# Patient Record
Sex: Female | Born: 1935
Health system: Southern US, Community
[De-identification: ages and names within clinical notes are randomized; demographics above are authoritative.]

## PROBLEM LIST (undated history)

## (undated) DIAGNOSIS — I429 Cardiomyopathy, unspecified: Secondary | ICD-10-CM

## (undated) DIAGNOSIS — E079 Disorder of thyroid, unspecified: Secondary | ICD-10-CM

## (undated) DIAGNOSIS — D649 Anemia, unspecified: Secondary | ICD-10-CM

## (undated) DIAGNOSIS — N189 Chronic kidney disease, unspecified: Secondary | ICD-10-CM

## (undated) DIAGNOSIS — C801 Malignant (primary) neoplasm, unspecified: Secondary | ICD-10-CM

---

## 2005-10-04 ENCOUNTER — Ambulatory Visit: Payer: Self-pay | Admitting: Pulmonary Disease

## 2005-10-09 ENCOUNTER — Ambulatory Visit (HOSPITAL_COMMUNITY): Admission: RE | Admit: 2005-10-09 | Discharge: 2005-10-09 | Payer: Self-pay | Admitting: Pulmonary Disease

## 2005-10-09 IMAGING — US US AORTA
1 series · 13 of 25 positions shown · non-contrast
Comparison: none

CLINICAL DATA: Prominence of abdominal aorta on physical examination.  History of cystectomy for bladder carcinoma with bowel conduit.
 AORTA/RETROPERITONEAL ULTRASOUND ? [DATE]:
TECHNIQUE: Complete retroperitoneal ultrasound examination was performed including evaluation of the abdominal aorta, IVC, common iliac vessel origins, and kidneys.

[Series 1: unknown · 0.28mm/px · 13 of 45 slices shown]
[im 1/45]
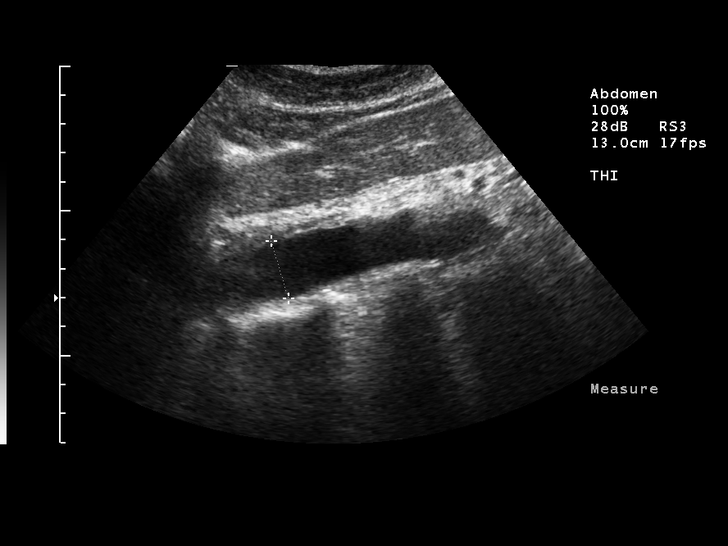
[im 4/45]
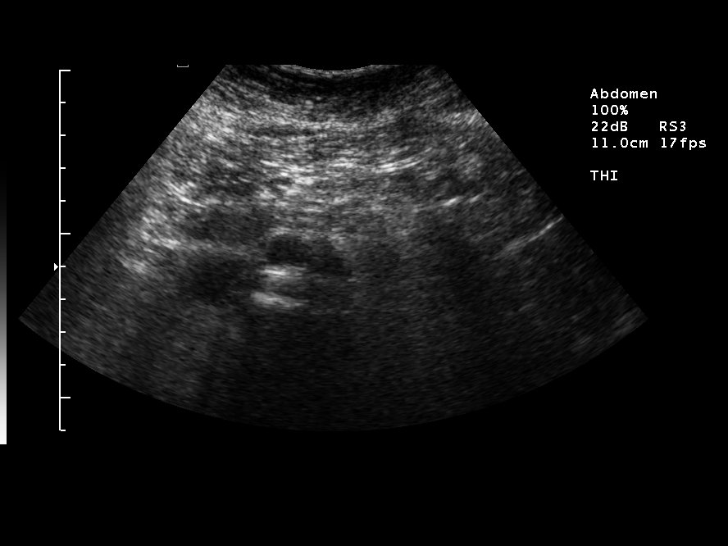
[im 8/45]
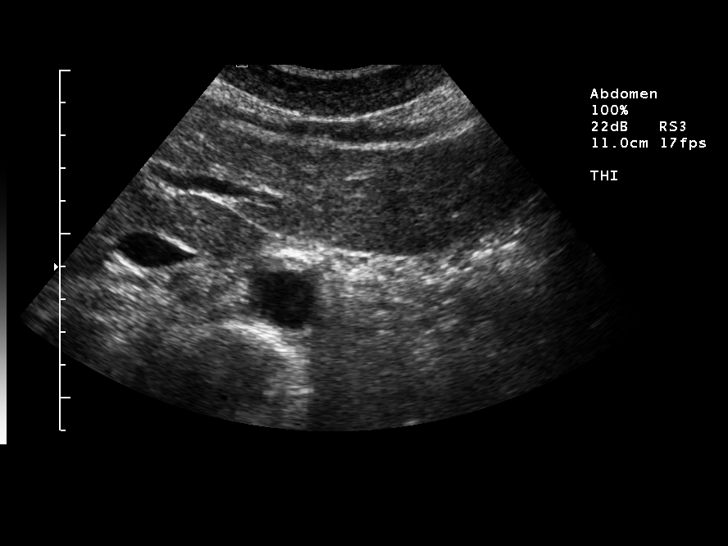
[im 12/45]
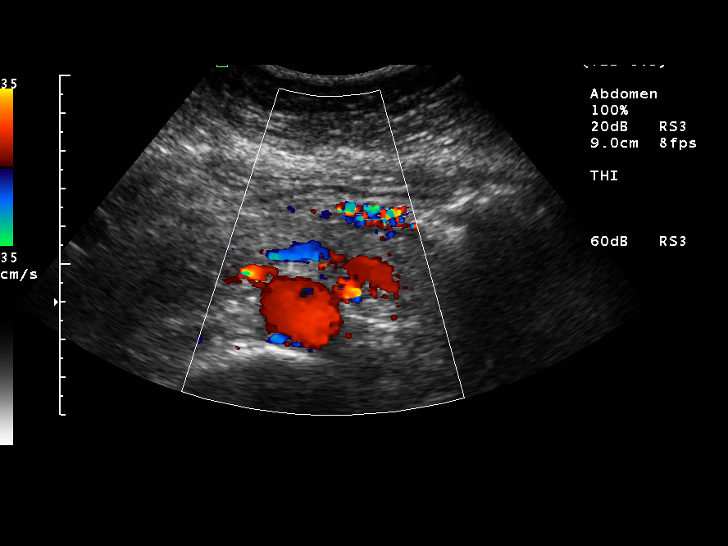
[im 15/45]
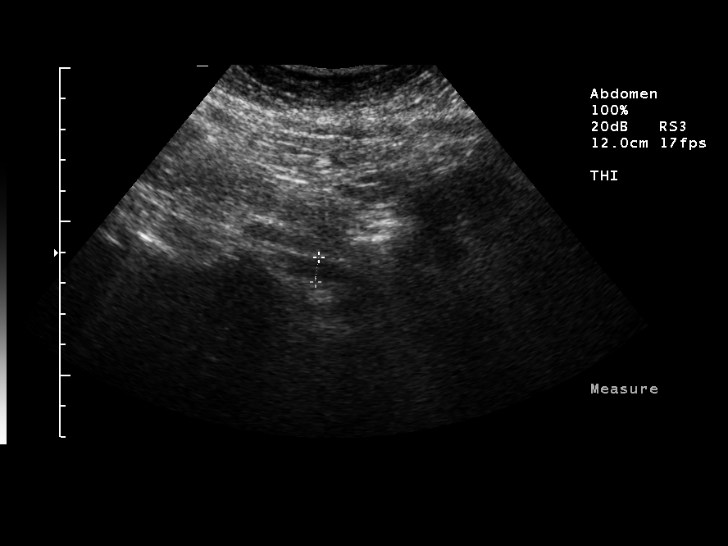
[im 19/45]
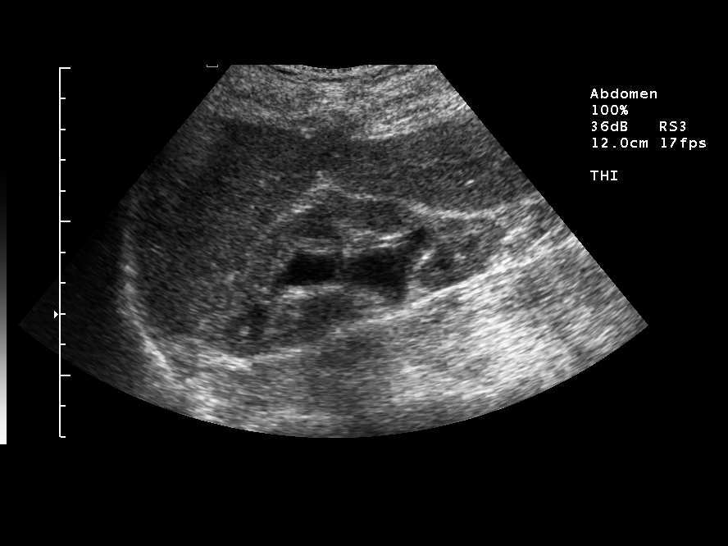
[im 23/45]
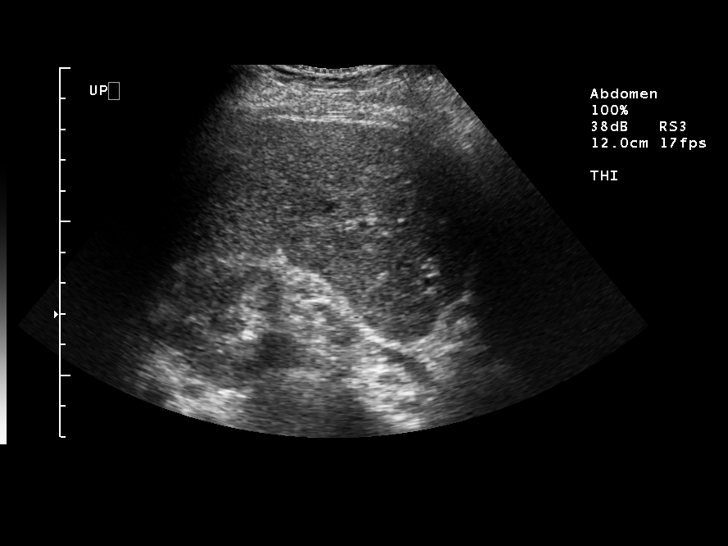
[im 26/45]
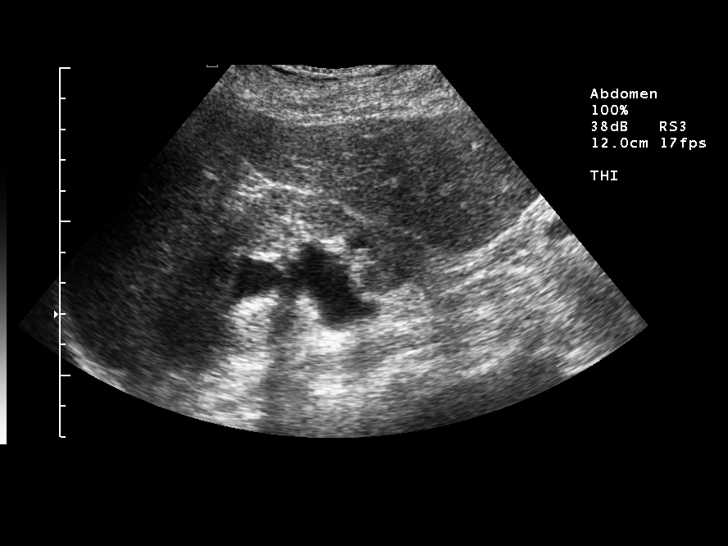
[im 30/45]
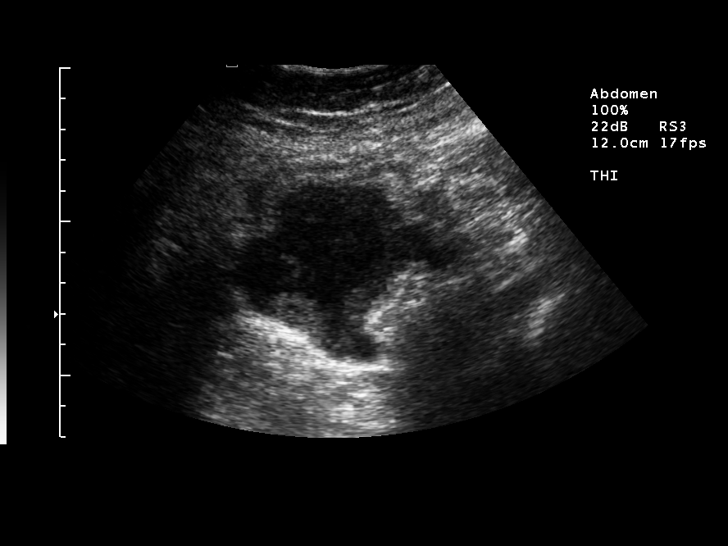
[im 34/45]
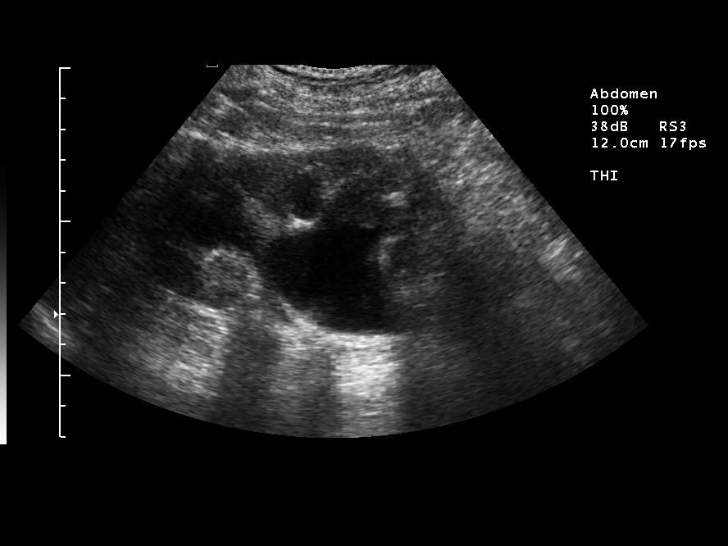
[im 37/45]
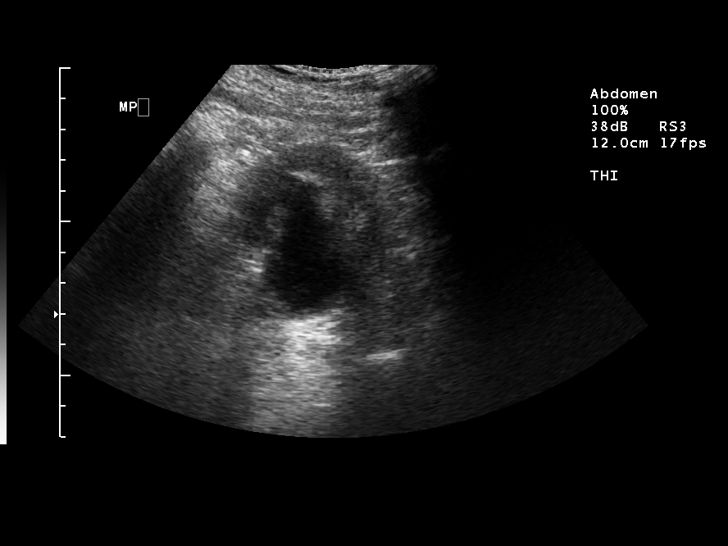
[im 41/45]
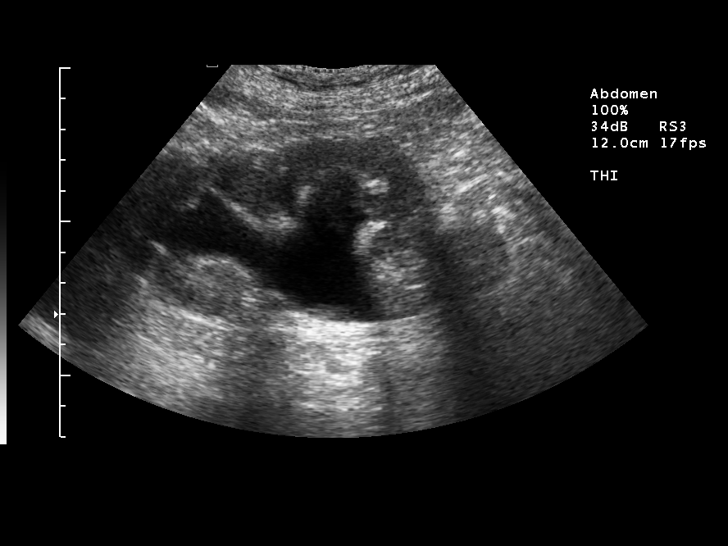
[im 45/45]
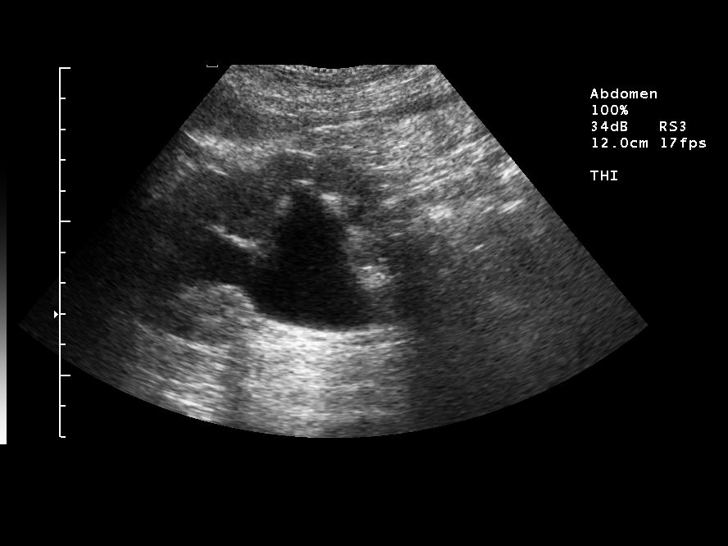

[13 of 25 positions shown; findings below may reference images not displayed]

FINDINGS: The abdominal aorta was well visualized on the study and demonstrates a maximal AP diameter of 2.1 cm.  A very minimal amount of calcified atherosclerotic plaque is present in the aorta.  Both common iliac artery origins are well visualized and normal in caliber measuring 10 mm bilaterally.  The inferior vena cava is normally patent.
 The right kidney measures 10.7 cm and the left kidney 11.4 cm.  Both demonstrate evidence of hydronephrosis, left greater than right.  Some degree of hydronephrosis would be expected with a bowel conduit given free reflux into the ureters.  Correlation is suggested with renal function as obstruction from mechanical cause such as ureteral anastomotic strictures cannot be excluded.  The conduit was also studied and demonstrates mild distention from urine.
IMPRESSION: 1.  Normal caliber of the abdominal aorta without aneurysm.
 2.  Bilateral hydronephrosis, left greater than right, in the setting of surgical urinary diversion.  This may simply be on the basis of free reflux via the implanted ureters.  Correlation is suggested with renal function as mechanical obstruction from ureteral stricture cannot be excluded by ultrasound.

## 2007-09-16 ENCOUNTER — Telehealth (INDEPENDENT_AMBULATORY_CARE_PROVIDER_SITE_OTHER): Payer: Self-pay | Admitting: *Deleted

## 2007-09-26 ENCOUNTER — Encounter: Payer: Self-pay | Admitting: Pulmonary Disease

## 2007-10-17 ENCOUNTER — Ambulatory Visit: Payer: Self-pay | Admitting: Pulmonary Disease

## 2007-10-17 DIAGNOSIS — C679 Malignant neoplasm of bladder, unspecified: Secondary | ICD-10-CM | POA: Insufficient documentation

## 2007-10-17 DIAGNOSIS — E039 Hypothyroidism, unspecified: Secondary | ICD-10-CM | POA: Insufficient documentation

## 2007-10-17 DIAGNOSIS — D126 Benign neoplasm of colon, unspecified: Secondary | ICD-10-CM | POA: Insufficient documentation

## 2007-10-17 DIAGNOSIS — E785 Hyperlipidemia, unspecified: Secondary | ICD-10-CM | POA: Insufficient documentation

## 2007-10-17 DIAGNOSIS — N259 Disorder resulting from impaired renal tubular function, unspecified: Secondary | ICD-10-CM | POA: Insufficient documentation

## 2007-10-21 ENCOUNTER — Ambulatory Visit: Payer: Self-pay | Admitting: Pulmonary Disease

## 2007-11-05 LAB — CONVERTED CEMR LAB
ALT: 18 units/L (ref 0–35)
AST: 20 units/L (ref 0–37)
Albumin: 3.4 g/dL — ABNORMAL LOW (ref 3.5–5.2)
Alkaline Phosphatase: 66 units/L (ref 39–117)
BUN: 25 mg/dL — ABNORMAL HIGH (ref 6–23)
Basophils Absolute: 0.1 10*3/uL (ref 0.0–0.1)
Basophils Relative: 1.1 % — ABNORMAL HIGH (ref 0.0–1.0)
Bilirubin, Direct: 0.1 mg/dL (ref 0.0–0.3)
CO2: 23 meq/L (ref 19–32)
Calcium: 9.1 mg/dL (ref 8.4–10.5)
Chloride: 112 meq/L (ref 96–112)
Cholesterol: 160 mg/dL (ref 0–200)
Creatinine, Ser: 1.6 mg/dL — ABNORMAL HIGH (ref 0.4–1.2)
Eosinophils Absolute: 0.2 10*3/uL (ref 0.0–0.7)
Eosinophils Relative: 4.2 % (ref 0.0–5.0)
GFR calc Af Amer: 40 mL/min
GFR calc non Af Amer: 33 mL/min
Glucose, Bld: 97 mg/dL (ref 70–99)
HCT: 30.9 % — ABNORMAL LOW (ref 36.0–46.0)
HDL: 40.4 mg/dL (ref >39.0)
Hemoglobin: 10.1 g/dL — ABNORMAL LOW (ref 12.0–15.0)
LDL Cholesterol: 110 mg/dL — ABNORMAL HIGH (ref 0–99)
Lymphocytes Relative: 28.8 % (ref 12.0–46.0)
MCHC: 32.7 g/dL (ref 30.0–36.0)
MCV: 91.5 fL (ref 78.0–100.0)
Monocytes Absolute: 0.5 10*3/uL (ref 0.1–1.0)
Monocytes Relative: 7.7 % (ref 3.0–12.0)
Neutro Abs: 3.4 10*3/uL (ref 1.4–7.7)
Neutrophils Relative %: 58.2 % (ref 43.0–77.0)
Platelets: 221 10*3/uL (ref 150–400)
Potassium: 4.5 meq/L (ref 3.5–5.1)
RBC: 3.38 M/uL — ABNORMAL LOW (ref 3.87–5.11)
RDW: 13.5 % (ref 11.5–14.6)
Sodium: 142 meq/L (ref 135–145)
TSH: 3.01 microintl units/mL (ref 0.35–5.50)
Total Bilirubin: 0.8 mg/dL (ref 0.3–1.2)
Total CHOL/HDL Ratio: 4
Total Protein: 7.4 g/dL (ref 6.0–8.3)
Triglycerides: 46 mg/dL (ref 0–149)
VLDL: 9 mg/dL (ref 0–40)
WBC: 5.9 10*3/uL (ref 4.5–10.5)

## 2009-01-22 ENCOUNTER — Encounter: Admission: RE | Admit: 2009-01-22 | Discharge: 2009-01-22 | Payer: Self-pay | Admitting: Family Medicine

## 2009-09-23 ENCOUNTER — Encounter: Admission: RE | Admit: 2009-09-23 | Discharge: 2009-09-23 | Payer: Self-pay | Admitting: Emergency Medicine

## 2009-09-23 IMAGING — CR DG CHEST 2V
2 series · 2 of 2 positions shown · non-contrast
Comparison: None.

CLINICAL DATA: Cough, congestion

CHEST - 2 VIEW

[view not recorded (1 of 2)]
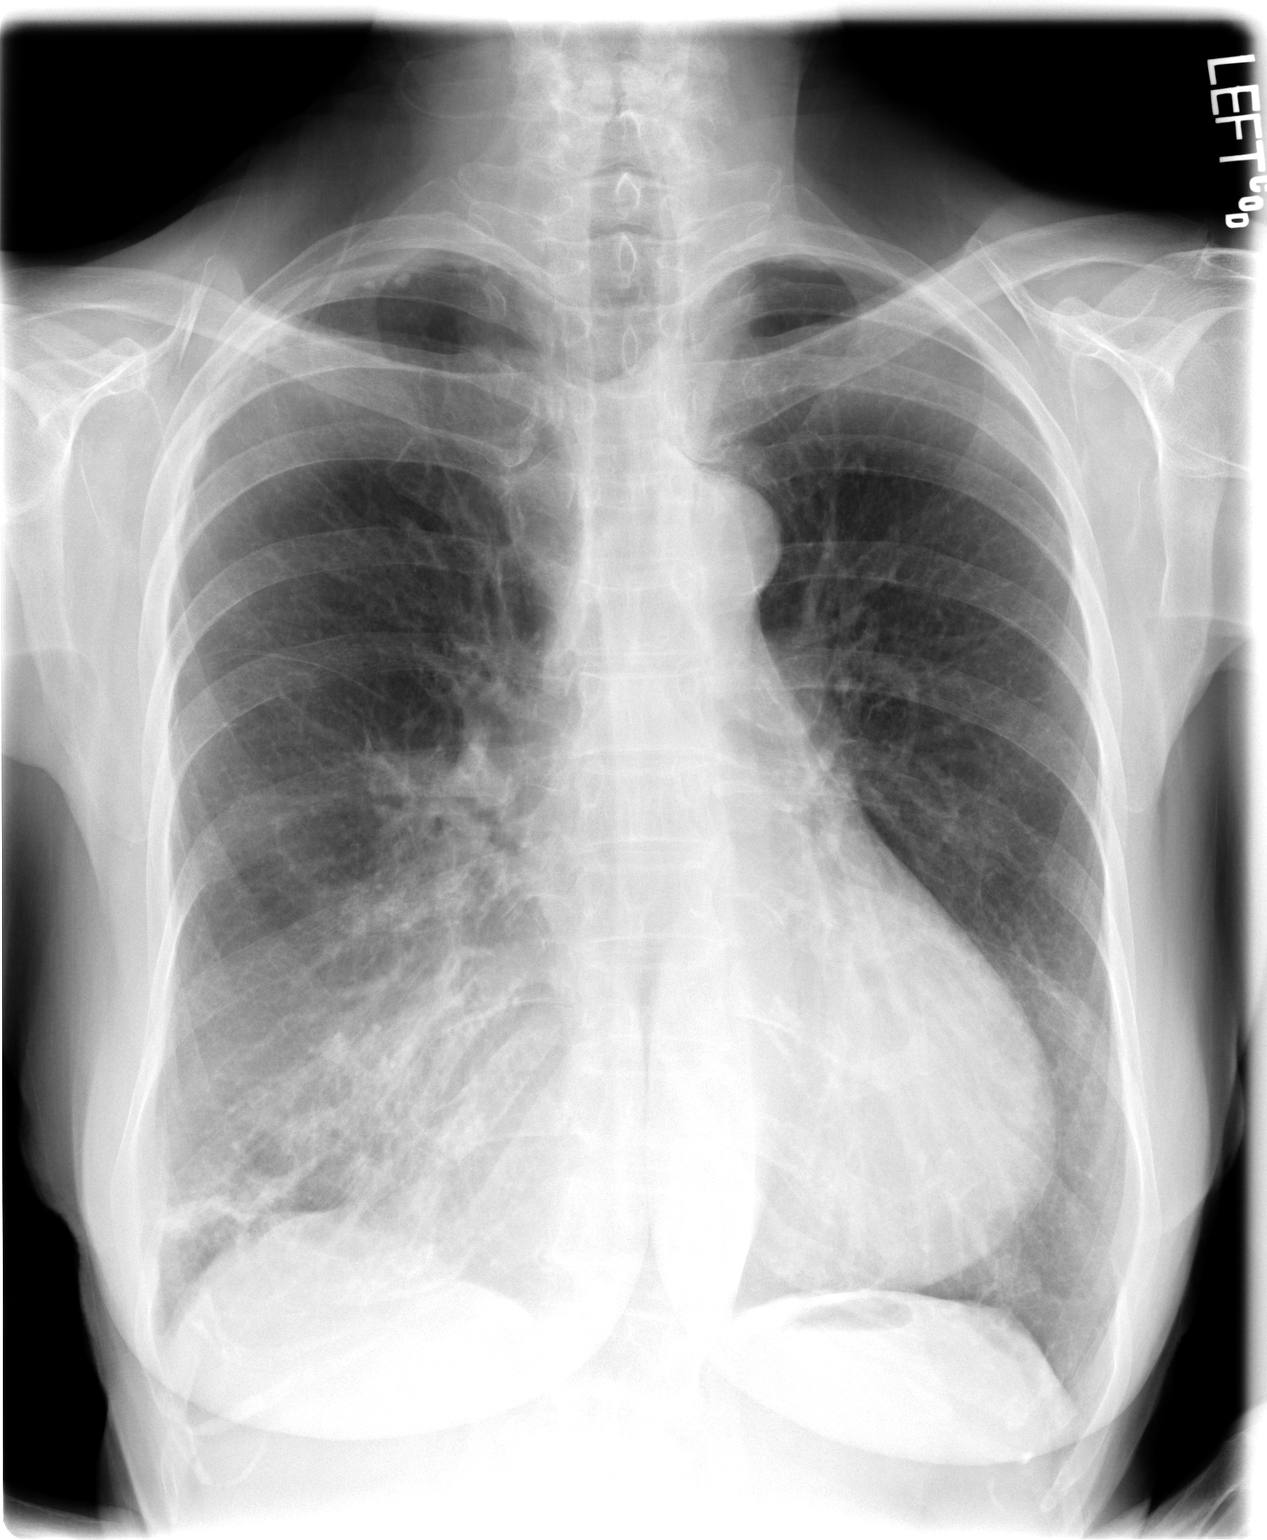

[view not recorded (2 of 2)]
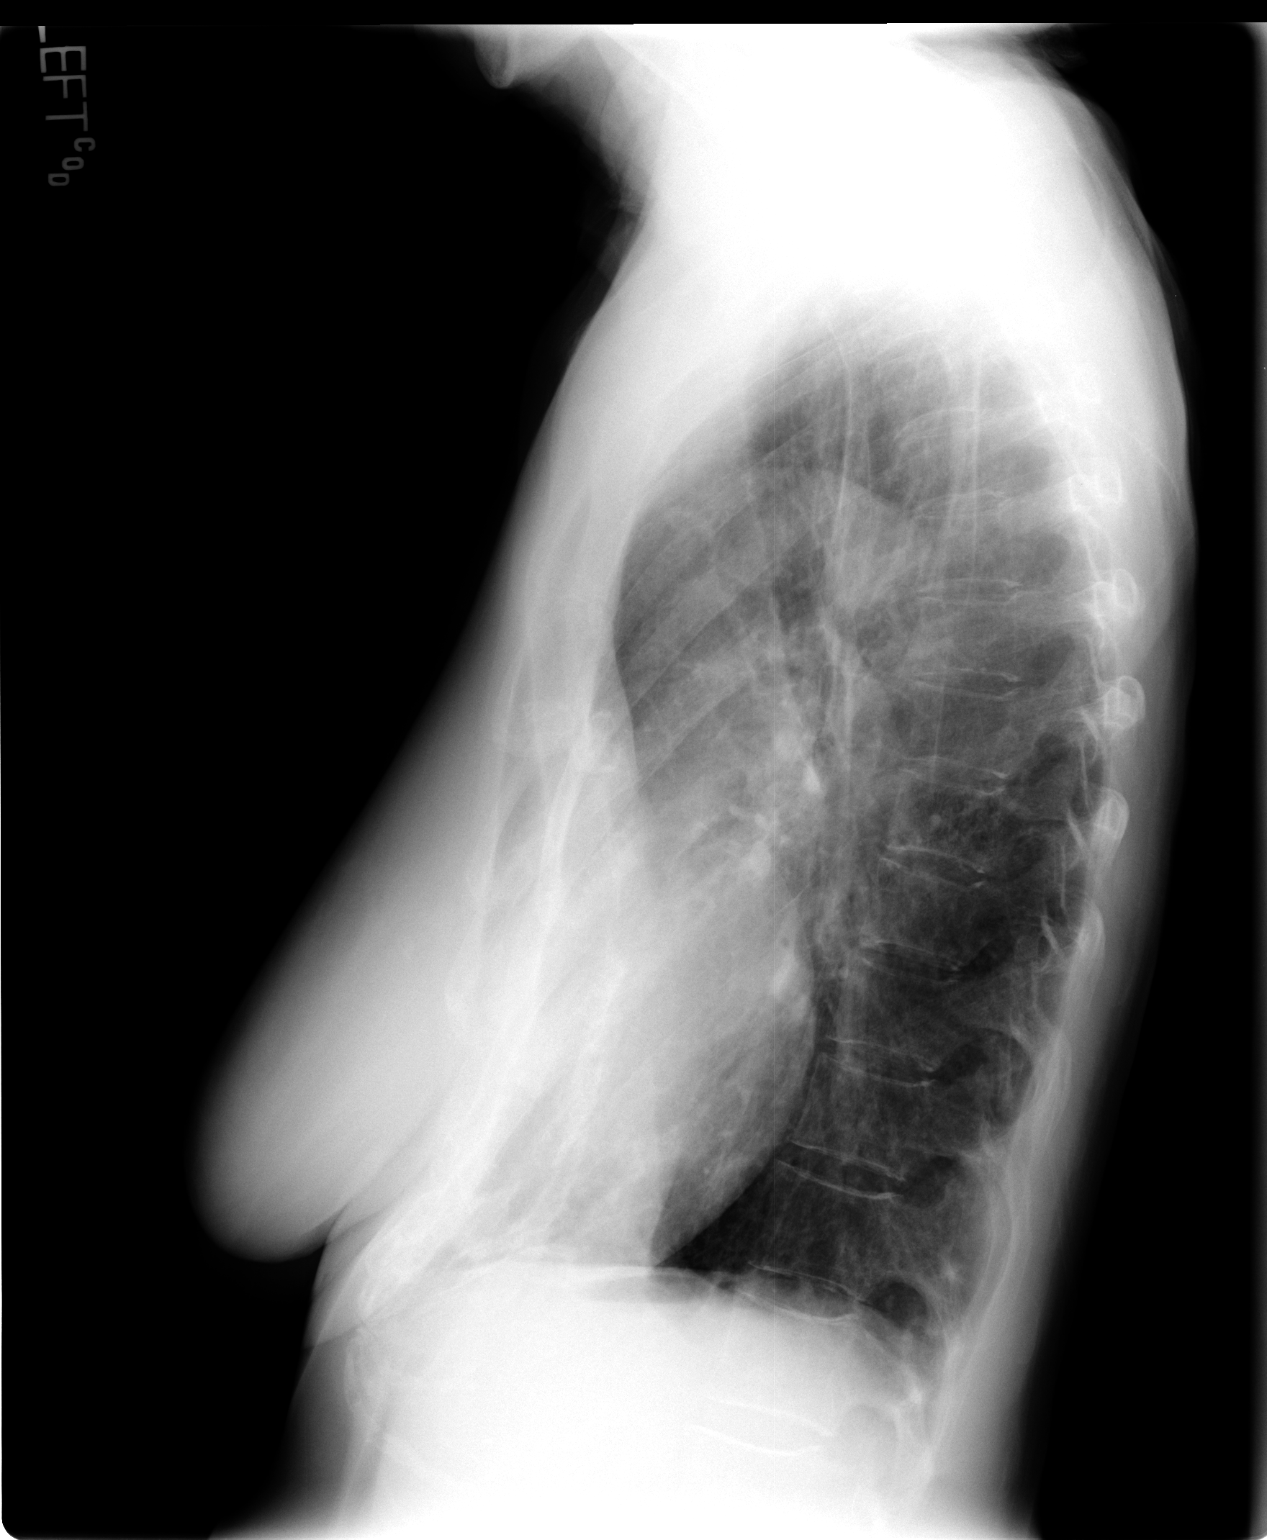

[2 of 2 positions shown; findings below may reference images not displayed]

FINDINGS: Indistinctness of the right heart border appears be due
to a pectus deformity compressing the heart shadow.  There is
slight linear opacity however at the right lung base which may
reflect atelectasis or pneumonia and follow-up chest x-ray is
recommended.  Otherwise the lungs are clear.  The heart is within
upper limits of normal.  No acute bony abnormality is seen.
IMPRESSION: 1.  Somewhat prominent markings at the right lung base may
represent either atelectasis or pneumonia.  Consider follow-up
chest x-ray.
2.  Indistinctness at the right lung base appears to be due to a
degree of pectus deformity.

## 2009-11-10 ENCOUNTER — Encounter: Admission: RE | Admit: 2009-11-10 | Discharge: 2009-11-10 | Payer: Self-pay | Admitting: Emergency Medicine

## 2009-11-10 IMAGING — CR DG CHEST 2V
2 series · 2 of 2 positions shown · non-contrast
Comparison: [DATE]

CLINICAL DATA: Follow up pneumonia.  Ex-smoker.

CHEST - 2 VIEW

[view not recorded (1 of 2)]
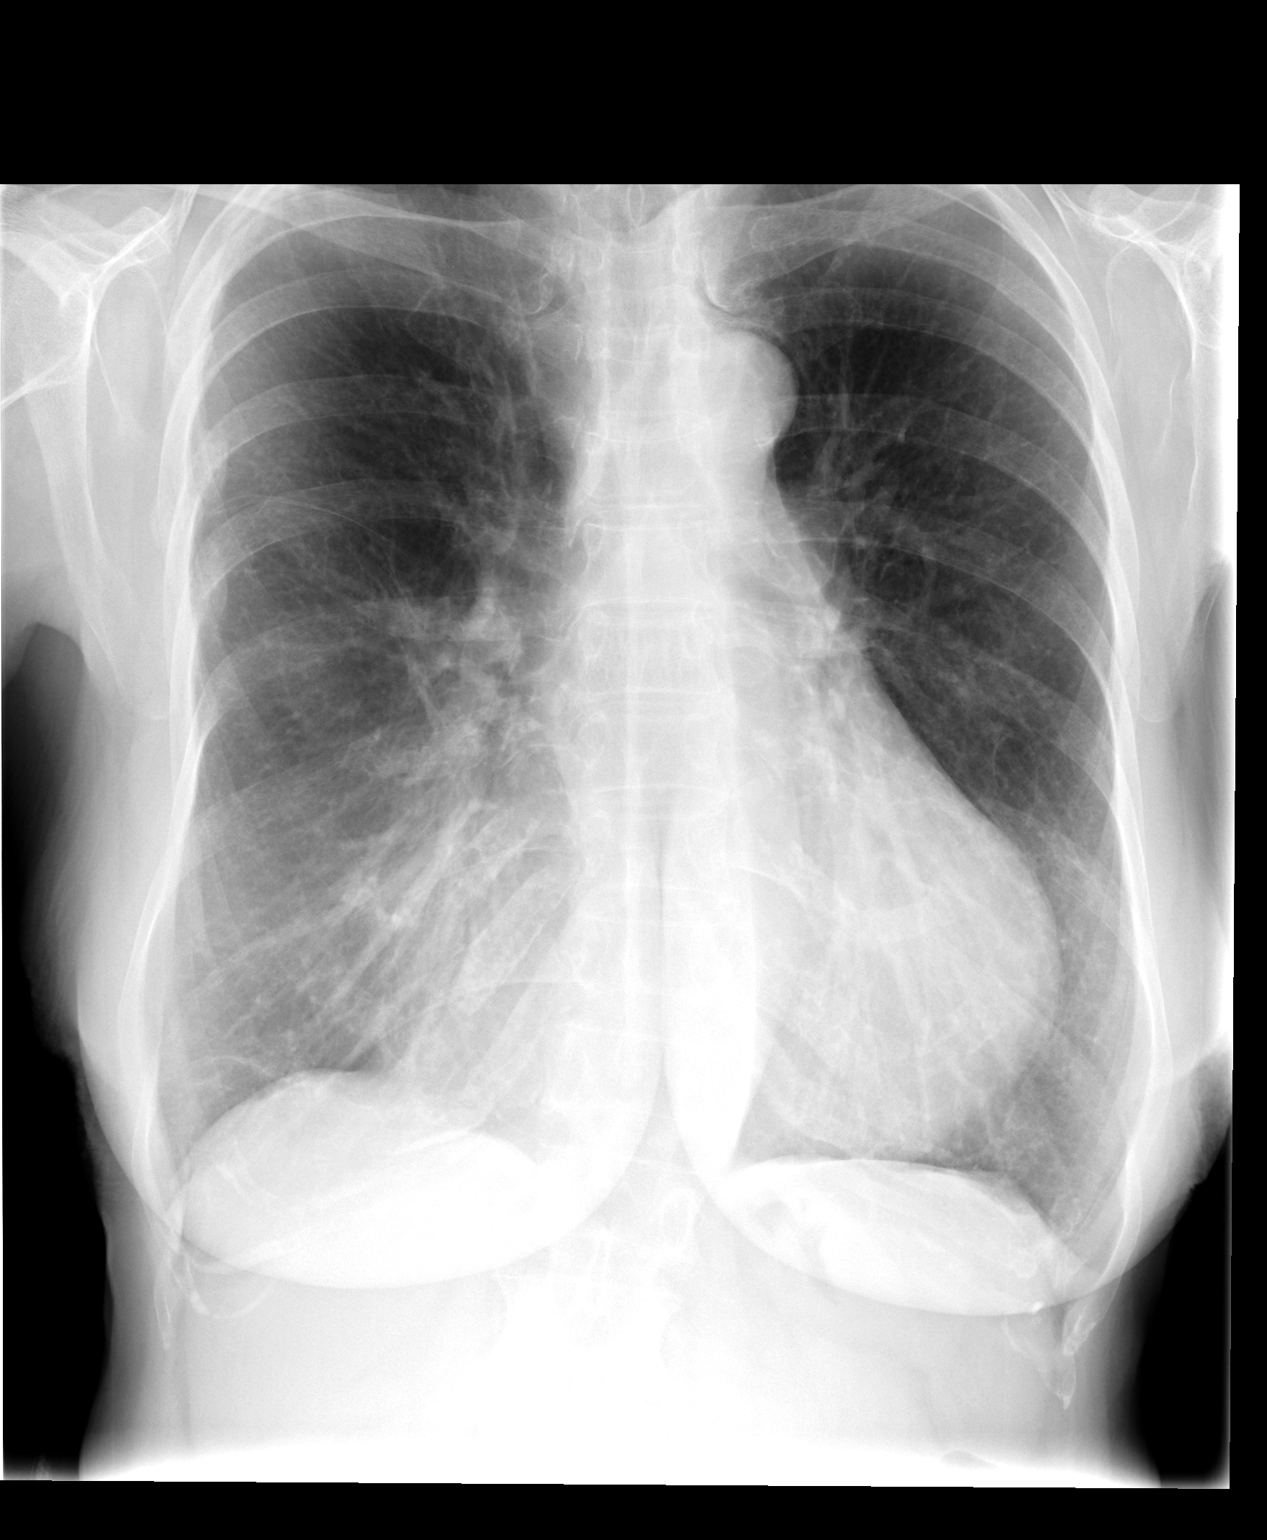

[view not recorded (2 of 2)]
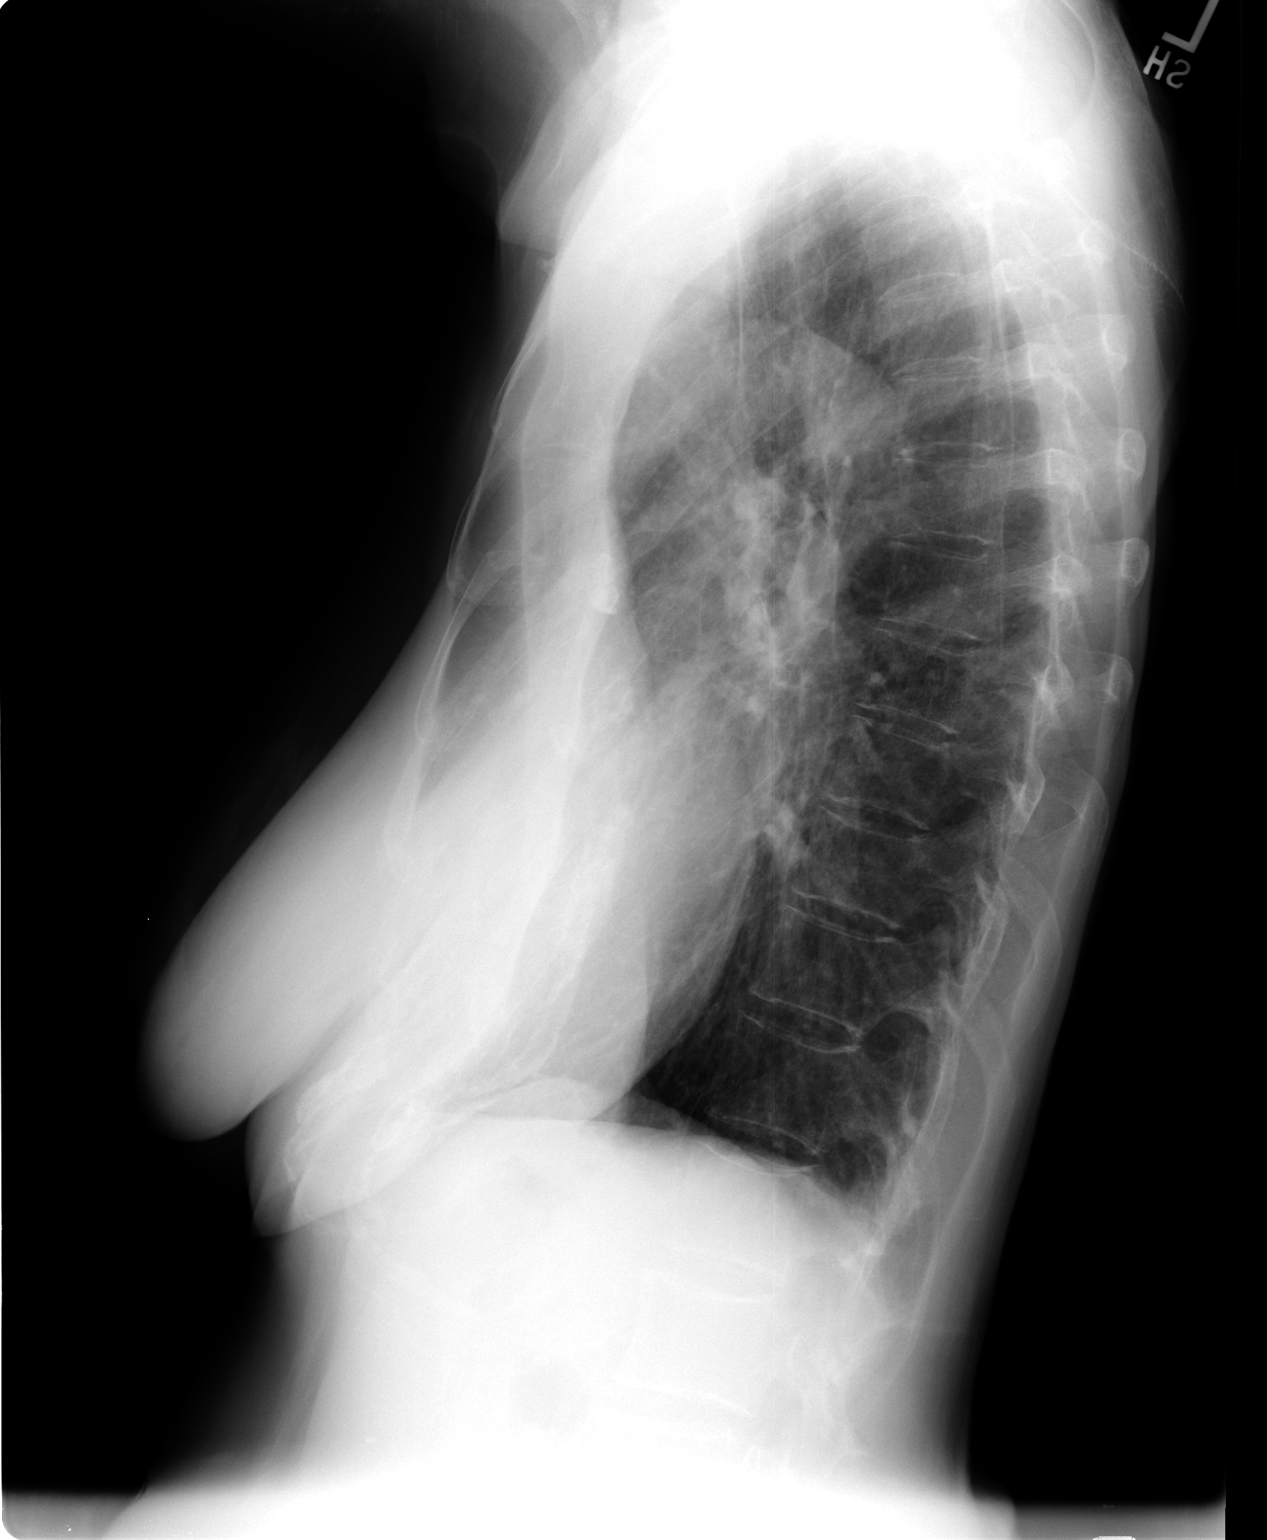

[2 of 2 positions shown; findings below may reference images not displayed]

FINDINGS: Trachea is midline.  Heart size stable.  Biapical pleural
parenchymal scarring.  Air space opacification at the right lung
base has nearly completely resolved in the interval.  Pectus
deformity creates added density at the base of the right
hemithorax, medially.  Left lung is otherwise clear.  No pleural
fluid.
IMPRESSION: Improving right basilar airspace disease.

## 2010-07-12 NOTE — Assessment & Plan Note (Signed)
Summary: follow up/ mbw   Chief Complaint:  2 year ROV....  History of Present Illness: 77 y/o WF here for a follow up visit... I have actually seen Glenda King only once before - on 10/04/05 - for a new patient evaluation... she has mult med problems as noted below & is followed regularly by [facility] for Urology due to bladder cancer...   Current Problem List:  HYPERCHOLESTEROLEMIA, BORDERLINE (ICD-272.4)  ~  FLP 4/07 showed TChol 195, TG 94, HDL 42, LDL 134... she was inclined to continue diet Rx.  HYPOTHYROIDISM (ICD-244.9) - Hx of Hashimoto's thyroiditis w/ eval in [place] and at [place]... she been on SYNTHROID 54mcg/d and clinically and biochemically euthyroid...  ~  last TSH 4/07 was 4.40...  COLONIC POLYPS (ICD-211.3) - last colonoscopy? was 2003 w/ villous adenoma removed... this was actually a cecal biopsy in 2003 at the time of her surgery... she is due for referral to GI for f/u colon...  RENAL INSUFFICIENCY (ICD-588.9)  ~  baseline Creat = 0.7 in 2003 prior to urologic surgery...  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 subseq radical cystectomy w/ continent diversion and self-caths 3-4 x per day...      Current Allergies (reviewed today): ! AMOXICILLIN  Past Medical History:        HYPERCHOLESTEROLEMIA, BORDERLINE (ICD-272.4)    HYPOTHYROIDISM (ICD-244.9)    COLONIC POLYPS (ICD-211.3)    RENAL INSUFFICIENCY (ICD-588.9)    Hx of BLADDER CANCER (ICD-188.9)  Past Surgical History:    S/P appendectomy    S/P TUR-BT w/ high grade muscle invasive bladder cancer (TCCa)    S/P Radical Cystectomy in [facility] in 2003 w/ continent diversion...    S/P Hysterectomy & pelvic exenteration   Family History:    Father died age 60 w/ CHF    Mother died age 45 w/ Emphysema (smoker)    2Sibs: 1Bro, 1Sis - good  general health, ? hx of thyroid disease  Social History:    Non-smoker    Soc Etoh    Retired Pharmacist, hospital    Review of Systems  The patient denies anorexia, fever, weight loss, weight gain, vision loss, decreased hearing, hoarseness, chest pain, syncope, peripheral edema, prolonged cough, hemoptysis, abdominal pain, melena, hematochezia, severe indigestion/heartburn, hematuria, incontinence, muscle weakness, suspicious skin lesions, transient blindness, difficulty walking, depression, unusual weight change, abnormal bleeding, enlarged lymph nodes, and angioedema.     Vital Signs:  Patient Profile:   78 Years Old Female Weight:      123.50 pounds O2 Sat:      98 % O2 treatment:    Room Air Temp:     96.5 degrees F oral Pulse rate:   75 / minute BP sitting:   138 / 78  (right arm)  Vitals Entered By: Ramiro Harvest CMA (Oct 17, 2007 11:55 AM)                 Physical Exam  WD, WN, 77 y/o WF in NAD... GENERAL:  Alert & oriented; pleasant & cooperative... HEENT:  [place]/AT, EOM-wnl, PERRLA, EACs-clear, TMs-wnl, NOSE-clear, THROAT-clear & wnl. NECK:  Supple w/ full ROM; no JVD; normal carotid impulses w/o bruits; no thyromegaly or nodules palpated; no lymphadenopathy. CHEST:  Clear to P & A; without wheezes/ rales/ or rhonchi heard; she has pectus excavatum... HEART:  Regular Rhythm;  without murmurs/ rubs/ or gallops detected...  ABDOMEN:  Soft & nontender; normal bowel sounds; no organomegaly or masses palpated...                     she has a small stoma covered w/ a band-aid in her right periumbil area (urinary stoma)... EXT: without deformities, mild arthritic changes; no varicose veins/ venous insuffic/ or edema. NEURO:  CN's intact; motor testing normal; sensory testing normal; gait normal & balance OK. DERM:  No lesions noted; no rash etc...       Impression & Recommendations:  Problem # 1:  HYPOTHYROIDISM (ICD-244.9) Assessment: Unchanged We will recheck her fasting  labs... refill the Synthroid. Her updated medication list for this problem includes:    Synthroid 75 Mcg Tabs (Levothyroxine sodium) .Marland Kitchen... Take 1tab by mouth daily   Problem # 2:  RENAL INSUFFICIENCY (ICD-588.9) Assessment: Unchanged Follow up blood work as noted...   Problem # 3:  Hx of BLADDER CANCER (ICD-188.9) Assessment: Unchanged She is followed by [facility] for Urology and being seen once per year now...  Problem # 4:  COLONIC POLYPS (ICD-211.3) Assessment: Unchanged Due for GI appt w/ colonoscopy... we will set up referral.  Problem # 5:  HYPERCHOLESTEROLEMIA, BORDERLINE (ICD-272.4) Assessment: Unchanged Ret for FASTING labs.  Complete Medication List: 1)  Adult Aspirin Low Strength 81 Mg Tbdp (Aspirin) .... Take 1/2 tablet by mouth once daily 2)  Synthroid 75 Mcg Tabs (Levothyroxine sodium) .... Take 1tab by mouth daily 3)  Multivitamins Tabs (Multiple vitamin) .... Take 1 tablet by mouth once a day   Patient Instructions: 1)  Today we refilled your Synthroid medication.Marland KitchenMarland Kitchen 2)  Return to our lab one day next week FASTING for your follow up blood work... please call the "phone tree" in a few days for your lab results.Marland KitchenMarland Kitchen 3)  We will arrange for a consultation w/ one of our gastroenterologists for your follow up colonoscopy... 4)  Call for any problems...   Prescriptions: SYNTHROID 75 MCG  TABS (LEVOTHYROXINE SODIUM) take 1tab by mouth daily  #30 x prn   Entered and Authorized by:   Noralee Space MD   Signed by:   Noralee Space MD on 10/17/2007   Method used:   Print then Give to Patient   RxID:   [id number]  ]

## 2010-07-12 NOTE — Progress Notes (Signed)
Summary: need refill  Phone Note Call from Patient   Caller: Patient Call For: nadel Summary of Call: need synthoid 75 mg called to pharmacy [facility] [phone] Patient's chart has been requested.  Initial call taken by: Gustavus Bryant,  September 16, 2007 11:38 AM  Follow-up for Phone Call        Pt has OV w/ SN on 10-16-07. Pt instructed to keep appt and we will send RX to pharmacy x1 month only. Follow-up by: Francesca Jewett CMA,  September 16, 2007 11:54 AM    New/Updated Medications: SYNTHROID 75 MCG  TABS (LEVOTHYROXINE SODIUM) 1 by mouth daily   Prescriptions: SYNTHROID 75 MCG  TABS (LEVOTHYROXINE SODIUM) 1 by mouth daily  #30 x 0   Entered by:   Francesca Jewett CMA   Authorized by:   Noralee Space MD   Signed by:   Francesca Jewett CMA on 09/16/2007   Method used:   Electronically sent to ...       [id number]       [address] [place], [place]  [address]       Ph: [phone] or [phone]       Fax: [phone]   RxID:   [id number]

## 2011-12-06 ENCOUNTER — Other Ambulatory Visit: Payer: Self-pay | Admitting: Internal Medicine

## 2011-12-06 DIAGNOSIS — N63 Unspecified lump in unspecified breast: Secondary | ICD-10-CM

## 2011-12-13 ENCOUNTER — Ambulatory Visit
Admission: RE | Admit: 2011-12-13 | Discharge: 2011-12-13 | Disposition: A | Discharge status: Home / Self Care | Payer: Medicare Other | Source: Ambulatory Visit | Attending: Internal Medicine | Admitting: Internal Medicine

## 2011-12-13 DIAGNOSIS — N63 Unspecified lump in unspecified breast: Secondary | ICD-10-CM

## 2012-11-16 DIAGNOSIS — Z72820 Sleep deprivation: Secondary | ICD-10-CM | POA: Insufficient documentation

## 2013-11-17 DIAGNOSIS — D631 Anemia in chronic kidney disease: Secondary | ICD-10-CM | POA: Insufficient documentation

## 2014-02-12 DIAGNOSIS — N1330 Unspecified hydronephrosis: Secondary | ICD-10-CM | POA: Insufficient documentation

## 2014-02-12 DIAGNOSIS — Z9889 Other specified postprocedural states: Secondary | ICD-10-CM | POA: Insufficient documentation

## 2014-02-12 DIAGNOSIS — Z8551 Personal history of malignant neoplasm of bladder: Secondary | ICD-10-CM | POA: Insufficient documentation

## 2014-03-24 DIAGNOSIS — G9389 Other specified disorders of brain: Secondary | ICD-10-CM | POA: Insufficient documentation

## 2016-01-20 DIAGNOSIS — R339 Retention of urine, unspecified: Secondary | ICD-10-CM | POA: Insufficient documentation

## 2016-05-15 DIAGNOSIS — D329 Benign neoplasm of meninges, unspecified: Secondary | ICD-10-CM | POA: Insufficient documentation

## 2019-05-18 ENCOUNTER — Encounter (HOSPITAL_BASED_OUTPATIENT_CLINIC_OR_DEPARTMENT_OTHER): Payer: Self-pay | Admitting: Emergency Medicine

## 2019-05-18 ENCOUNTER — Other Ambulatory Visit: Payer: Self-pay

## 2019-05-18 ENCOUNTER — Emergency Department (HOSPITAL_BASED_OUTPATIENT_CLINIC_OR_DEPARTMENT_OTHER)
Admission: EM | Admit: 2019-05-18 | Discharge: 2019-05-19 | Disposition: A | Discharge status: Home / Self Care | Payer: Medicare Other | Attending: Emergency Medicine | Admitting: Emergency Medicine

## 2019-05-18 DIAGNOSIS — S0181XA Laceration without foreign body of other part of head, initial encounter: Secondary | ICD-10-CM | POA: Insufficient documentation

## 2019-05-18 DIAGNOSIS — N189 Chronic kidney disease, unspecified: Secondary | ICD-10-CM | POA: Diagnosis not present

## 2019-05-18 DIAGNOSIS — W540XXA Bitten by dog, initial encounter: Secondary | ICD-10-CM | POA: Diagnosis not present

## 2019-05-18 DIAGNOSIS — Z880 Allergy status to penicillin: Secondary | ICD-10-CM | POA: Diagnosis not present

## 2019-05-18 DIAGNOSIS — S0083XA Contusion of other part of head, initial encounter: Secondary | ICD-10-CM

## 2019-05-18 DIAGNOSIS — Z888 Allergy status to other drugs, medicaments and biological substances status: Secondary | ICD-10-CM | POA: Insufficient documentation

## 2019-05-18 DIAGNOSIS — S0101XA Laceration without foreign body of scalp, initial encounter: Secondary | ICD-10-CM | POA: Insufficient documentation

## 2019-05-18 DIAGNOSIS — Z8551 Personal history of malignant neoplasm of bladder: Secondary | ICD-10-CM | POA: Insufficient documentation

## 2019-05-18 DIAGNOSIS — Z79899 Other long term (current) drug therapy: Secondary | ICD-10-CM | POA: Diagnosis not present

## 2019-05-18 DIAGNOSIS — Z23 Encounter for immunization: Secondary | ICD-10-CM | POA: Diagnosis not present

## 2019-05-18 DIAGNOSIS — Y92009 Unspecified place in unspecified non-institutional (private) residence as the place of occurrence of the external cause: Secondary | ICD-10-CM | POA: Diagnosis not present

## 2019-05-18 DIAGNOSIS — E039 Hypothyroidism, unspecified: Secondary | ICD-10-CM | POA: Diagnosis not present

## 2019-05-18 DIAGNOSIS — Y9389 Activity, other specified: Secondary | ICD-10-CM | POA: Diagnosis not present

## 2019-05-18 DIAGNOSIS — Y999 Unspecified external cause status: Secondary | ICD-10-CM | POA: Insufficient documentation

## 2019-05-18 DIAGNOSIS — Z7982 Long term (current) use of aspirin: Secondary | ICD-10-CM | POA: Diagnosis not present

## 2019-05-18 DIAGNOSIS — S0185XA Open bite of other part of head, initial encounter: Secondary | ICD-10-CM

## 2019-05-18 HISTORY — DX: Anemia, unspecified: D64.9

## 2019-05-18 HISTORY — DX: Malignant (primary) neoplasm, unspecified: C80.1

## 2019-05-18 HISTORY — DX: Disorder of thyroid, unspecified: E07.9

## 2019-05-18 HISTORY — DX: Chronic kidney disease, unspecified: N18.9

## 2019-05-18 HISTORY — DX: Cardiomyopathy, unspecified: I42.9

## 2019-05-18 MED ORDER — LIDOCAINE-EPINEPHRINE 2 %-1:200000 IJ SOLN
20.0000 mL | Freq: Once | INTRAMUSCULAR | Status: AC
Start: 2019-05-18 — End: 2019-05-19
  Administered 2019-05-19: 20 mL
  Filled 2019-05-18: qty 20

## 2019-05-18 MED ORDER — TETANUS-DIPHTH-ACELL PERTUSSIS 5-2.5-18.5 LF-MCG/0.5 IM SUSP
0.5000 mL | Freq: Once | INTRAMUSCULAR | Status: AC
  Administered 2019-05-18: 0.5 mL via INTRAMUSCULAR
  Filled 2019-05-18: qty 0.5

## 2019-05-18 NOTE — ED Triage Notes (Addendum)
Bitten by her own dog. Multiple lacerations to forehead, largest approx 5 cm. Takes baby aspirin daily. Denies pain. Needs tdap updated. Dog's shots are up to date, police already aware of bite.

## 2019-05-19 DIAGNOSIS — S0181XA Laceration without foreign body of other part of head, initial encounter: Secondary | ICD-10-CM | POA: Diagnosis not present

## 2019-05-19 MED ORDER — METRONIDAZOLE 500 MG PO TABS: 500.0000 mg | ORAL_TABLET | Freq: Three times a day (TID) | ORAL | 0 refills | Status: DC

## 2019-05-19 MED ORDER — DOXYCYCLINE HYCLATE 100 MG PO CAPS: 100.0000 mg | ORAL_CAPSULE | Freq: Two times a day (BID) | ORAL | 0 refills | Status: DC

## 2019-05-19 NOTE — Discharge Instructions (Addendum)
You were seen today after a dog bite.  It is very important that you closely monitor for redness, pus, any signs or symptoms of infection.  You will need staple removal in 10 days.  He had a significant hematoma to her forehead.  I would expect that you will develop blackouts over the next 1 to 2 days.  Keep a compressive bandage.  Apply antibiotic ointment and take oral antibiotics as directed.

## 2019-05-19 NOTE — ED Notes (Addendum)
[place] security called at request of patient for a ride back to her facility. Security officer states they are not allowed to leave campus at night as he is the only Garment/textile technologist on duty.

## 2019-05-19 NOTE — ED Provider Notes (Signed)
[facility] EMERGENCY DEPARTMENT Provider Note   CSN: [id number] Arrival date & time: 05/18/19  2228     History   Chief Complaint Chief Complaint  Patient presents with  . Animal Bite    HPI Glenda King is a 85 y.o. female.     HPI  85 year old female who presents after dog bite.  Patient reports that 1 to 2 weeks ago she adopted a boxer.  He states that she has not had any problems.  Tonight she went to give him a treatment plan.  She is given pain.  He bit her over the forehead and scalp.  She did not lose consciousness.  She denies other injury.  She denies pain anywhere.  She reports that the dog's vaccinations are up-to-date.  She is unsure when her last tetanus shot is.  She takes a baby aspirin daily.  Denies any significant pain.  Past Medical History:  Diagnosis Date  . Anemia   . Cancer [facility])    bladder cancer  . Cardiomyopathy ([place])   . CKD (chronic kidney disease)   . Thyroid disease    hypothyroid    Patient Active Problem List   Diagnosis Date Noted  . BLADDER CANCER 10/17/2007  . COLONIC POLYPS 10/17/2007  . HYPOTHYROIDISM 10/17/2007  . HYPERCHOLESTEROLEMIA, BORDERLINE 10/17/2007  . RENAL INSUFFICIENCY 10/17/2007    History reviewed. No pertinent surgical history.   OB History   No obstetric history on file.      Home Medications    Prior to Admission medications   Medication Sig Start Date End Date Taking? Authorizing Provider  aspirin EC 81 MG tablet Take by mouth. 12/06/11  Yes [provider]  D2000 ULTRA STRENGTH 50 MCG (2000 UT) CAPS Take 1 capsule by mouth daily. 03/18/19   [provider]  doxycycline (VIBRAMYCIN) 100 MG capsule Take 1 capsule (100 mg total) by mouth 2 (two) times daily. 05/19/19   , Barbette Hair, MD  levETIRAcetam (KEPPRA) 500 MG tablet Take 500 mg by mouth 2 (two) times daily. 05/13/19   [provider]  levothyroxine (SYNTHROID) 100 MCG tablet Take 100 mcg by mouth  daily. 05/13/19   [provider]  metroNIDAZOLE (FLAGYL) 500 MG tablet Take 1 tablet (500 mg total) by mouth 3 (three) times daily. 05/19/19   , Barbette Hair, MD  Multiple Vitamins tablet Take 1 tablet by mouth daily. 03/18/19   [provider]  sodium bicarbonate 650 MG tablet Take 1,300 mg by mouth 2 (two) times daily. 03/12/19   [provider]    Family History No family history on file.  Social History Social History   Tobacco Use  . Smoking status: Never Smoker  . Smokeless tobacco: Never Used  Substance Use Topics  . Alcohol use: Not Currently  . Drug use: Not Currently     Allergies   Amoxicillin, Gadolinium derivatives, and Pollen extract   Review of Systems Review of Systems  Constitutional: Negative for fever.  Skin: Positive for color change and wound.  Neurological: Negative for headaches.  All other systems reviewed and are negative.    Physical Exam Updated Vital Signs BP (!) 186/97 (BP Location: Right Arm)   Pulse 84   Temp 97.9 F (36.6 C) (Oral)   Resp 18   Ht 1.676 m (5\' 6" )   Wt 59 kg   SpO2 95%   BMI 20.98 kg/m   Physical Exam Vitals signs and nursing note reviewed.  Constitutional:  Appearance: She is well-developed.     Comments: ABCs intact  HENT:     Head:      Comments: Multiple dog bites noted over the forehead and right temple area Large hematoma underlying approximately 8 cm jagged bite over the central forehead, mild injury noted Lac #1:  8 cm jagged, central forehead Lac #2:  3 cm just above left eye brow Lac #3:  2 cm, deep, right temporal region Lac #4: 3 cm, right temporal region Eyes:     Pupils: Pupils are equal, round, and reactive to light.     Comments: Slight bruising noted medially to both orbits  Neck:     Musculoskeletal: Neck supple.  Cardiovascular:     Rate and Rhythm: Normal rate and regular rhythm.  Pulmonary:     Effort: Pulmonary effort is normal. No respiratory  distress.  Abdominal:     General: Bowel sounds are normal.     Palpations: Abdomen is soft.  Musculoskeletal:        General: No tenderness, deformity or signs of injury.  Skin:    General: Skin is warm and dry.     Comments: See HEENT above Additional skin tear noted over the left forearm, bleeding controlled  Neurological:     Mental Status: She is alert and oriented to person, place, and time.  Psychiatric:        Mood and Affect: Mood normal.      ED Treatments / Results  Labs (all labs ordered are listed, but only abnormal results are displayed) Labs Reviewed - No data to display  EKG None  Radiology No results found.  Procedures .Marland KitchenLaceration Repair  Date/Time: 05/19/2019 12:39 AM Performed by: Merryl Hacker, MD Authorized by: Merryl Hacker, MD   Consent:    Consent obtained:  Verbal   Consent given by:  Patient   Risks discussed:  Infection, pain and poor cosmetic result   Alternatives discussed:  No treatment Anesthesia (see MAR for exact dosages):    Anesthesia method:  Local infiltration   Local anesthetic:  Lidocaine 2% WITH epi Laceration details:    Location:  Face   Face location:  Forehead   Length (cm):  8   Depth (mm):  5 Repair type:    Repair type:  Intermediate Pre-procedure details:    Preparation:  Patient was prepped and draped in usual sterile fashion and imaging obtained to evaluate for foreign bodies Exploration:    Hemostasis achieved with:  Epinephrine and direct pressure   Wound exploration: wound explored through full range of motion     Wound extent: no foreign bodies/material noted, no muscle damage noted and no underlying fracture noted   Treatment:    Area cleansed with:  Betadine and saline   Amount of cleaning:  Standard   Irrigation solution:  Sterile saline   Irrigation volume:  1000   Irrigation method:  Pressure wash Skin repair:    Repair method:  Sutures   Suture size:  5-0   Suture material:   Fast-absorbing gut   Suture technique:  Simple interrupted   Number of sutures:  11 Approximation:    Approximation:  Close Post-procedure details:    Dressing:  Bulky dressing   Patient tolerance of procedure:  Tolerated well, no immediate complications .Marland KitchenLaceration Repair  Date/Time: 05/19/2019 12:41 AM Performed by: Merryl Hacker, MD Authorized by: Merryl Hacker, MD   Consent:    Consent obtained:  Verbal   Consent given  by:  Patient   Risks discussed:  Infection, pain and poor cosmetic result   Alternatives discussed:  No treatment Anesthesia (see MAR for exact dosages):    Anesthesia method:  None Laceration details:    Location:  Face   Face location:  Forehead   Length (cm):  3   Depth (mm):  2 Repair type:    Repair type:  Simple Pre-procedure details:    Preparation:  Patient was prepped and draped in usual sterile fashion Exploration:    Wound exploration: wound explored through full range of motion     Wound extent: no muscle damage noted     Contaminated: no   Treatment:    Area cleansed with:  Betadine and saline   Amount of cleaning:  Standard   Irrigation solution:  Sterile saline   Irrigation volume:  1000   Irrigation method:  Pressure wash   Visualized foreign bodies/material removed: no   Skin repair:    Repair method:  Sutures   Suture size:  5-0   Suture material:  Fast-absorbing gut   Suture technique:  Simple interrupted   Number of sutures:  3 Approximation:    Approximation:  Close Post-procedure details:    Dressing:  Open (no dressing)   Patient tolerance of procedure:  Tolerated well, no immediate complications .Marland KitchenLaceration Repair  Date/Time: 05/19/2019 12:42 AM Performed by: Merryl Hacker, MD Authorized by: Merryl Hacker, MD   Consent:    Consent obtained:  Verbal   Consent given by:  Patient   Risks discussed:  Infection and pain   Alternatives discussed:  No treatment Anesthesia (see MAR for exact  dosages):    Anesthesia method:  Local infiltration   Local anesthetic:  Lidocaine 2% WITH epi Laceration details:    Location:  Scalp   Scalp location:  R temporal   Length (cm):  2   Depth (mm):  5 Repair type:    Repair type:  Simple Pre-procedure details:    Preparation:  Patient was prepped and draped in usual sterile fashion Exploration:    Wound extent: no areolar tissue violation noted, no muscle damage noted and no vascular damage noted     Contaminated: no   Treatment:    Area cleansed with:  Betadine and saline   Amount of cleaning:  Standard   Irrigation solution:  Sterile saline   Irrigation volume:  1000   Irrigation method:  Pressure wash   Visualized foreign bodies/material removed: no   Skin repair:    Repair method:  Staples   Number of staples:  2 Approximation:    Approximation:  Close Post-procedure details:    Dressing:  Open (no dressing)   Patient tolerance of procedure:  Tolerated well, no immediate complications .Marland KitchenLaceration Repair  Date/Time: 05/19/2019 12:43 AM Performed by: Merryl Hacker, MD Authorized by: Merryl Hacker, MD   Consent:    Consent obtained:  Verbal   Consent given by:  Patient   Risks discussed:  Pain, poor cosmetic result and infection   Alternatives discussed:  No treatment Anesthesia (see MAR for exact dosages):    Anesthesia method:  None Laceration details:    Location:  Scalp   Scalp location:  R temporal   Length (cm):  3   Depth (mm):  5 Repair type:    Repair type:  Simple Pre-procedure details:    Preparation:  Patient was prepped and draped in usual sterile fashion Exploration:    Wound exploration:  wound explored through full range of motion     Wound extent: no muscle damage noted     Contaminated: no   Treatment:    Area cleansed with:  Betadine and saline   Amount of cleaning:  Standard   Irrigation solution:  Sterile saline   Irrigation volume:  1000   Irrigation method:  Pressure wash    Visualized foreign bodies/material removed: no   Skin repair:    Repair method:  Staples   Number of staples:  2 Post-procedure details:    Dressing:  Open (no dressing)   Patient tolerance of procedure:  Tolerated well, no immediate complications   (including critical care time)  Medications Ordered in ED Medications  lidocaine-EPINEPHrine (XYLOCAINE W/EPI) 2 %-1:200000 (PF) injection 20 mL (has no administration in time range)  Tdap (BOOSTRIX) injection 0.5 mL (0.5 mLs Intramuscular Given 05/18/19 2331)     Initial Impression / Assessment and Plan / ED Course  I have reviewed the triage vital signs and the nursing notes.  Pertinent labs & imaging results that were available during my care of the patient were reviewed by me and considered in my medical decision making (see chart for details).        Patient presents with a dog bite to the face.  She is overall nontoxic-appearing and ABCs intact.  She does have a significant hematoma with several lacerations over the face.  She reports taking a baby aspirin.  Patient's tetanus is up-to-date.  At this time I do not feel she needs imaging as I think suspicion for underlying fracture and intracranial bleed is low.  Patient lacerations were repaired after extensive cleaning.  I did speak with her regarding wound infection risk given with dog bite.  She has an amoxicillin allergy.  She will be placed on doxycycline and Flagyl as an alternative to Augmentin.  She will need staple removal in 10 days.  She did have a significant hematoma of the forehead.  Recommend compressive dressing.  Also advised her that she will likely develop significant discoloration and bruising about the orbits as the hematoma resorbs.  After history, exam, and medical workup I feel the patient has been appropriately medically screened and is safe for discharge home. Pertinent diagnoses were discussed with the patient. Patient was given return precautions.   Final  Clinical Impressions(s) / ED Diagnoses   Final diagnoses:  Dog bite of face, initial encounter  Traumatic hematoma of forehead, initial encounter    ED Discharge Orders         Ordered    doxycycline (VIBRAMYCIN) 100 MG capsule  2 times daily     05/19/19 0034    metroNIDAZOLE (FLAGYL) 500 MG tablet  3 times daily     05/19/19 0034           , Barbette Hair, MD 05/19/19 0045

## 2019-05-19 NOTE — ED Notes (Signed)
Pt's brother called at her request to pick her up.

## 2019-09-03 ENCOUNTER — Encounter (HOSPITAL_BASED_OUTPATIENT_CLINIC_OR_DEPARTMENT_OTHER): Payer: Self-pay | Admitting: Emergency Medicine

## 2019-09-03 ENCOUNTER — Inpatient Hospital Stay (HOSPITAL_BASED_OUTPATIENT_CLINIC_OR_DEPARTMENT_OTHER)
Admission: EM | Admit: 2019-09-03 | Discharge: 2019-09-06 | DRG: 445 | Disposition: A | Discharge status: Home / Self Care | Payer: Medicare Other | Attending: Internal Medicine | Admitting: Internal Medicine

## 2019-09-03 ENCOUNTER — Emergency Department (HOSPITAL_BASED_OUTPATIENT_CLINIC_OR_DEPARTMENT_OTHER): Payer: Medicare Other

## 2019-09-03 ENCOUNTER — Other Ambulatory Visit: Payer: Self-pay

## 2019-09-03 DIAGNOSIS — K2970 Gastritis, unspecified, without bleeding: Secondary | ICD-10-CM | POA: Diagnosis present

## 2019-09-03 DIAGNOSIS — N136 Pyonephrosis: Secondary | ICD-10-CM | POA: Diagnosis present

## 2019-09-03 DIAGNOSIS — Z7982 Long term (current) use of aspirin: Secondary | ICD-10-CM | POA: Diagnosis not present

## 2019-09-03 DIAGNOSIS — R000 Tachycardia, unspecified: Secondary | ICD-10-CM | POA: Diagnosis not present

## 2019-09-03 DIAGNOSIS — K8020 Calculus of gallbladder without cholecystitis without obstruction: Secondary | ICD-10-CM | POA: Diagnosis present

## 2019-09-03 DIAGNOSIS — Z881 Allergy status to other antibiotic agents status: Secondary | ICD-10-CM | POA: Diagnosis not present

## 2019-09-03 DIAGNOSIS — D638 Anemia in other chronic diseases classified elsewhere: Secondary | ICD-10-CM | POA: Diagnosis not present

## 2019-09-03 DIAGNOSIS — D519 Vitamin B12 deficiency anemia, unspecified: Secondary | ICD-10-CM | POA: Diagnosis present

## 2019-09-03 DIAGNOSIS — L299 Pruritus, unspecified: Secondary | ICD-10-CM | POA: Diagnosis present

## 2019-09-03 DIAGNOSIS — D631 Anemia in chronic kidney disease: Secondary | ICD-10-CM | POA: Diagnosis present

## 2019-09-03 DIAGNOSIS — K8050 Calculus of bile duct without cholangitis or cholecystitis without obstruction: Secondary | ICD-10-CM

## 2019-09-03 DIAGNOSIS — Z888 Allergy status to other drugs, medicaments and biological substances status: Secondary | ICD-10-CM | POA: Diagnosis not present

## 2019-09-03 DIAGNOSIS — E7800 Pure hypercholesterolemia, unspecified: Secondary | ICD-10-CM | POA: Diagnosis present

## 2019-09-03 DIAGNOSIS — K8051 Calculus of bile duct without cholangitis or cholecystitis with obstruction: Secondary | ICD-10-CM

## 2019-09-03 DIAGNOSIS — N184 Chronic kidney disease, stage 4 (severe): Secondary | ICD-10-CM | POA: Diagnosis present

## 2019-09-03 DIAGNOSIS — D6959 Other secondary thrombocytopenia: Secondary | ICD-10-CM | POA: Diagnosis present

## 2019-09-03 DIAGNOSIS — R0682 Tachypnea, not elsewhere classified: Secondary | ICD-10-CM | POA: Diagnosis not present

## 2019-09-03 DIAGNOSIS — Z20822 Contact with and (suspected) exposure to covid-19: Secondary | ICD-10-CM | POA: Diagnosis present

## 2019-09-03 DIAGNOSIS — E872 Acidosis: Secondary | ICD-10-CM | POA: Diagnosis present

## 2019-09-03 DIAGNOSIS — Z8551 Personal history of malignant neoplasm of bladder: Secondary | ICD-10-CM

## 2019-09-03 DIAGNOSIS — K2980 Duodenitis without bleeding: Secondary | ICD-10-CM | POA: Diagnosis present

## 2019-09-03 DIAGNOSIS — Z936 Other artificial openings of urinary tract status: Secondary | ICD-10-CM

## 2019-09-03 DIAGNOSIS — Z79899 Other long term (current) drug therapy: Secondary | ICD-10-CM | POA: Diagnosis not present

## 2019-09-03 DIAGNOSIS — I428 Other cardiomyopathies: Secondary | ICD-10-CM | POA: Diagnosis present

## 2019-09-03 DIAGNOSIS — E039 Hypothyroidism, unspecified: Secondary | ICD-10-CM | POA: Diagnosis present

## 2019-09-03 DIAGNOSIS — K831 Obstruction of bile duct: Secondary | ICD-10-CM

## 2019-09-03 DIAGNOSIS — Z906 Acquired absence of other parts of urinary tract: Secondary | ICD-10-CM

## 2019-09-03 DIAGNOSIS — R109 Unspecified abdominal pain: Secondary | ICD-10-CM

## 2019-09-03 DIAGNOSIS — N390 Urinary tract infection, site not specified: Secondary | ICD-10-CM

## 2019-09-03 DIAGNOSIS — Z87898 Personal history of other specified conditions: Secondary | ICD-10-CM | POA: Diagnosis not present

## 2019-09-03 DIAGNOSIS — E876 Hypokalemia: Secondary | ICD-10-CM | POA: Diagnosis present

## 2019-09-03 DIAGNOSIS — R17 Unspecified jaundice: Secondary | ICD-10-CM

## 2019-09-03 DIAGNOSIS — Z91041 Radiographic dye allergy status: Secondary | ICD-10-CM

## 2019-09-03 DIAGNOSIS — Z79890 Hormone replacement therapy (postmenopausal): Secondary | ICD-10-CM

## 2019-09-03 DIAGNOSIS — R7401 Elevation of levels of liver transaminase levels: Secondary | ICD-10-CM | POA: Diagnosis present

## 2019-09-03 DIAGNOSIS — K8021 Calculus of gallbladder without cholecystitis with obstruction: Secondary | ICD-10-CM

## 2019-09-03 DIAGNOSIS — K838 Other specified diseases of biliary tract: Secondary | ICD-10-CM

## 2019-09-03 DIAGNOSIS — K8000 Calculus of gallbladder with acute cholecystitis without obstruction: Secondary | ICD-10-CM | POA: Diagnosis present

## 2019-09-03 DIAGNOSIS — K8063 Calculus of gallbladder and bile duct with acute cholecystitis with obstruction: Principal | ICD-10-CM | POA: Diagnosis present

## 2019-09-03 DIAGNOSIS — N19 Unspecified kidney failure: Secondary | ICD-10-CM

## 2019-09-03 LAB — CBC WITH DIFFERENTIAL/PLATELET
Abs Immature Granulocytes: 0.03 10*3/uL (ref 0.00–0.07)
Basophils Absolute: 0 10*3/uL (ref 0.0–0.1)
Basophils Relative: 1 %
Eosinophils Absolute: 0 10*3/uL (ref 0.0–0.5)
Eosinophils Relative: 1 %
HCT: 28.4 % — ABNORMAL LOW (ref 36.0–46.0)
Hemoglobin: 9.5 g/dL — ABNORMAL LOW (ref 12.0–15.0)
Immature Granulocytes: 1 %
Lymphocytes Relative: 5 %
Lymphs Abs: 0.3 10*3/uL — ABNORMAL LOW (ref 0.7–4.0)
MCH: 31.9 pg (ref 26.0–34.0)
MCHC: 33.5 g/dL (ref 30.0–36.0)
MCV: 95.3 fL (ref 80.0–100.0)
Monocytes Absolute: 0.4 10*3/uL (ref 0.1–1.0)
Monocytes Relative: 9 %
Neutro Abs: 4.2 10*3/uL (ref 1.7–7.7)
Neutrophils Relative %: 83 %
Platelets: 153 10*3/uL (ref 150–400)
RBC: 2.98 MIL/uL — ABNORMAL LOW (ref 3.87–5.11)
RDW: 16.7 % — ABNORMAL HIGH (ref 11.5–15.5)
WBC: 5 10*3/uL (ref 4.0–10.5)
nRBC: 0 % (ref 0.0–0.2)

## 2019-09-03 LAB — LIPASE, BLOOD: Lipase: 264 U/L — ABNORMAL HIGH (ref 11–51)

## 2019-09-03 LAB — URINALYSIS, ROUTINE W REFLEX MICROSCOPIC
Glucose, UA: NEGATIVE mg/dL
Ketones, ur: NEGATIVE mg/dL
Nitrite: NEGATIVE
Protein, ur: 30 mg/dL — AB
Specific Gravity, Urine: 1.01 (ref 1.005–1.030)
pH: 7 (ref 5.0–8.0)

## 2019-09-03 LAB — BASIC METABOLIC PANEL
Anion gap: 10 (ref 5–15)
BUN: 29 mg/dL — ABNORMAL HIGH (ref 8–23)
CO2: 18 mmol/L — ABNORMAL LOW (ref 22–32)
Calcium: 8.9 mg/dL (ref 8.9–10.3)
Chloride: 108 mmol/L (ref 98–111)
Creatinine, Ser: 2.11 mg/dL — ABNORMAL HIGH (ref 0.44–1.00)
GFR calc Af Amer: 24 mL/min — ABNORMAL LOW (ref >60)
GFR calc non Af Amer: 20 mL/min — ABNORMAL LOW (ref >60)
Glucose, Bld: 147 mg/dL — ABNORMAL HIGH (ref 70–99)
Potassium: 3.1 mmol/L — ABNORMAL LOW (ref 3.5–5.1)
Sodium: 136 mmol/L (ref 135–145)

## 2019-09-03 LAB — RESPIRATORY PANEL BY RT PCR (FLU A&B, COVID)
Influenza A by PCR: NEGATIVE
Influenza B by PCR: NEGATIVE
SARS Coronavirus 2 by RT PCR: NEGATIVE

## 2019-09-03 LAB — URINALYSIS, MICROSCOPIC (REFLEX): WBC, UA: >50 WBC/hpf (ref 0–5)

## 2019-09-03 LAB — HEPATITIS PANEL, ACUTE
HCV Ab: NONREACTIVE
Hep A IgM: NONREACTIVE
Hep B C IgM: NONREACTIVE
Hepatitis B Surface Ag: NONREACTIVE

## 2019-09-03 LAB — HEPATIC FUNCTION PANEL
ALT: 209 U/L — ABNORMAL HIGH (ref 0–44)
AST: 169 U/L — ABNORMAL HIGH (ref 15–41)
Albumin: 2.9 g/dL — ABNORMAL LOW (ref 3.5–5.0)
Alkaline Phosphatase: 410 U/L — ABNORMAL HIGH (ref 38–126)
Bilirubin, Direct: 10 mg/dL — ABNORMAL HIGH (ref 0.0–0.2)
Indirect Bilirubin: 6.1 mg/dL — ABNORMAL HIGH (ref 0.3–0.9)
Total Bilirubin: 16.1 mg/dL — ABNORMAL HIGH (ref 0.3–1.2)
Total Protein: 6.9 g/dL (ref 6.5–8.1)

## 2019-09-03 IMAGING — CR DG CHEST 2V
2 series · 2 of 2 positions shown · non-contrast
Comparison: [DATE]

CLINICAL DATA: Generalized weakness

EXAM:
CHEST - 2 VIEW

[w chest pa]
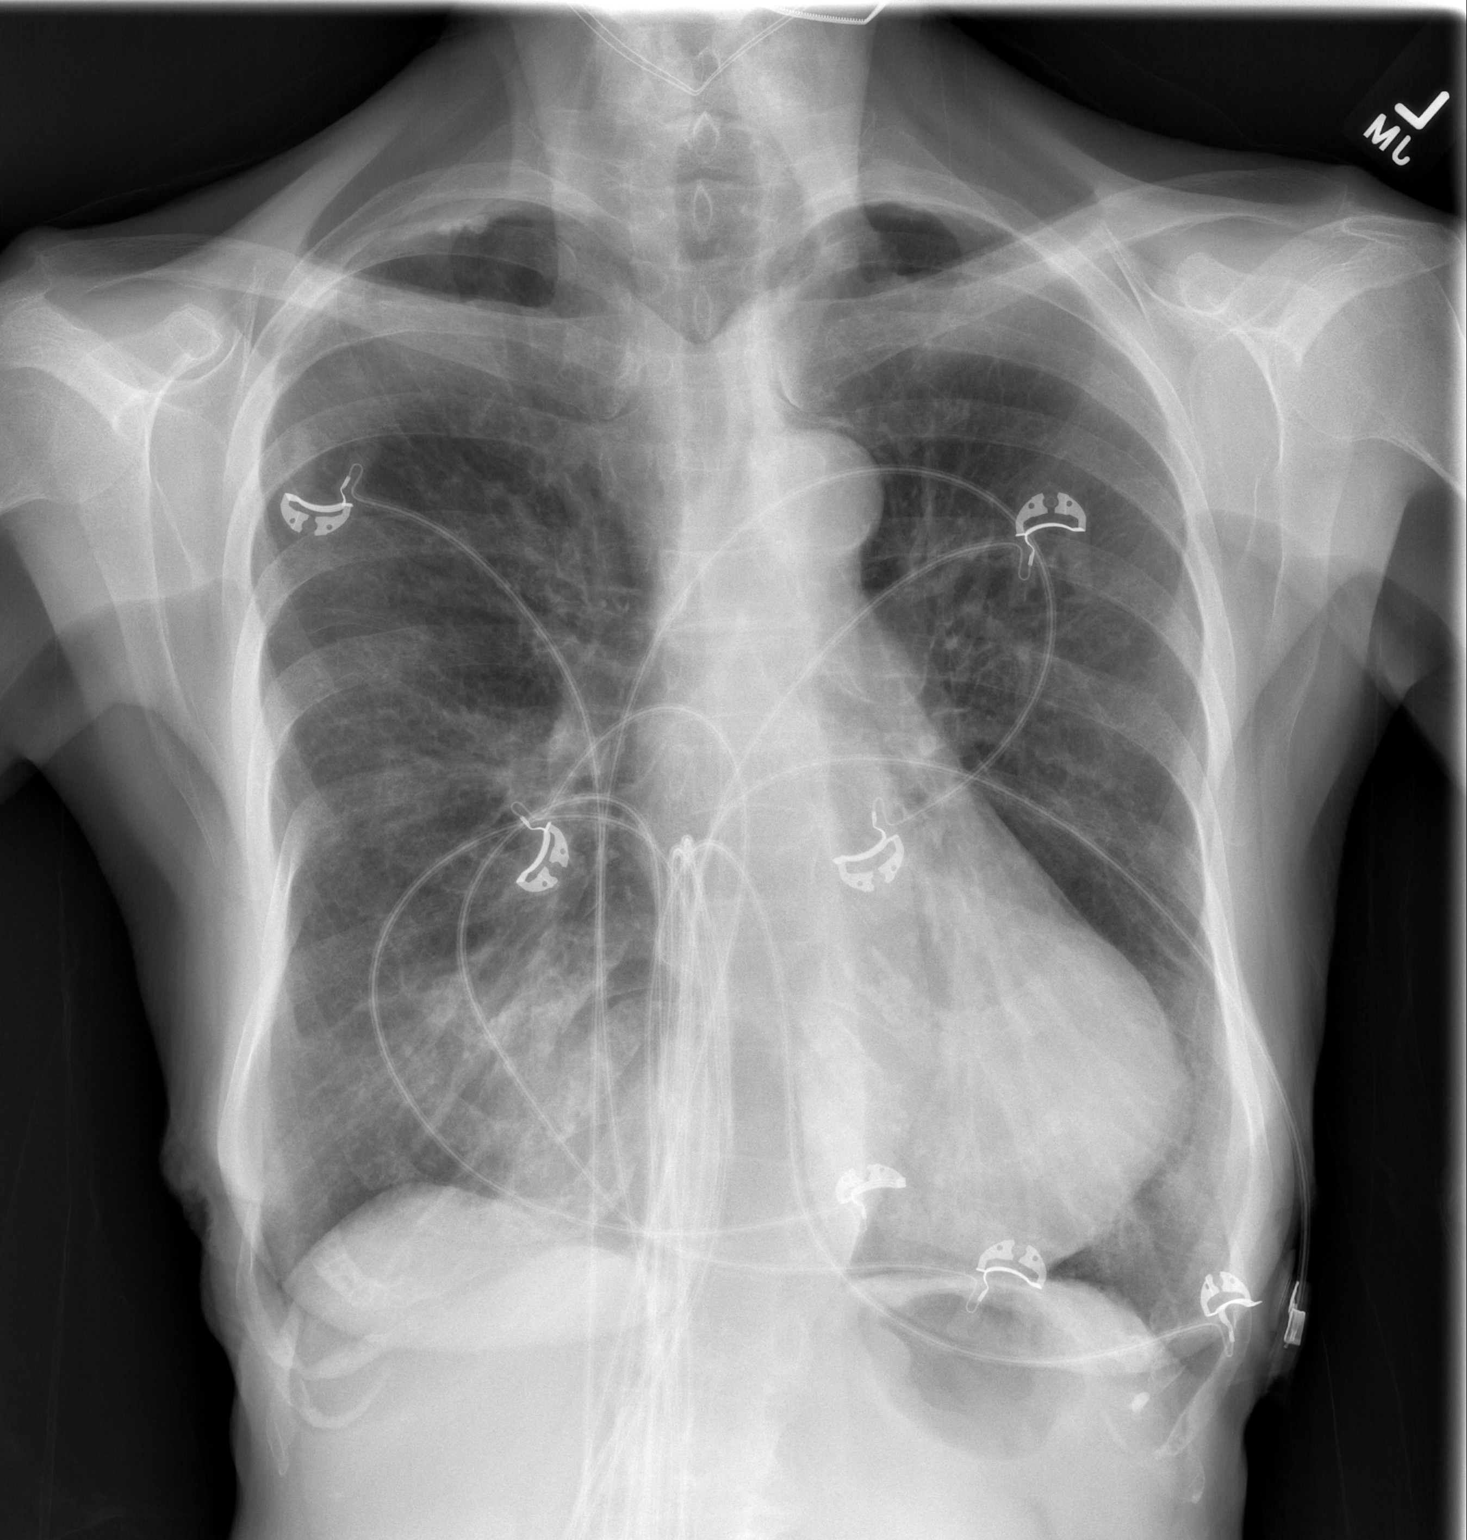

[w chest lat]
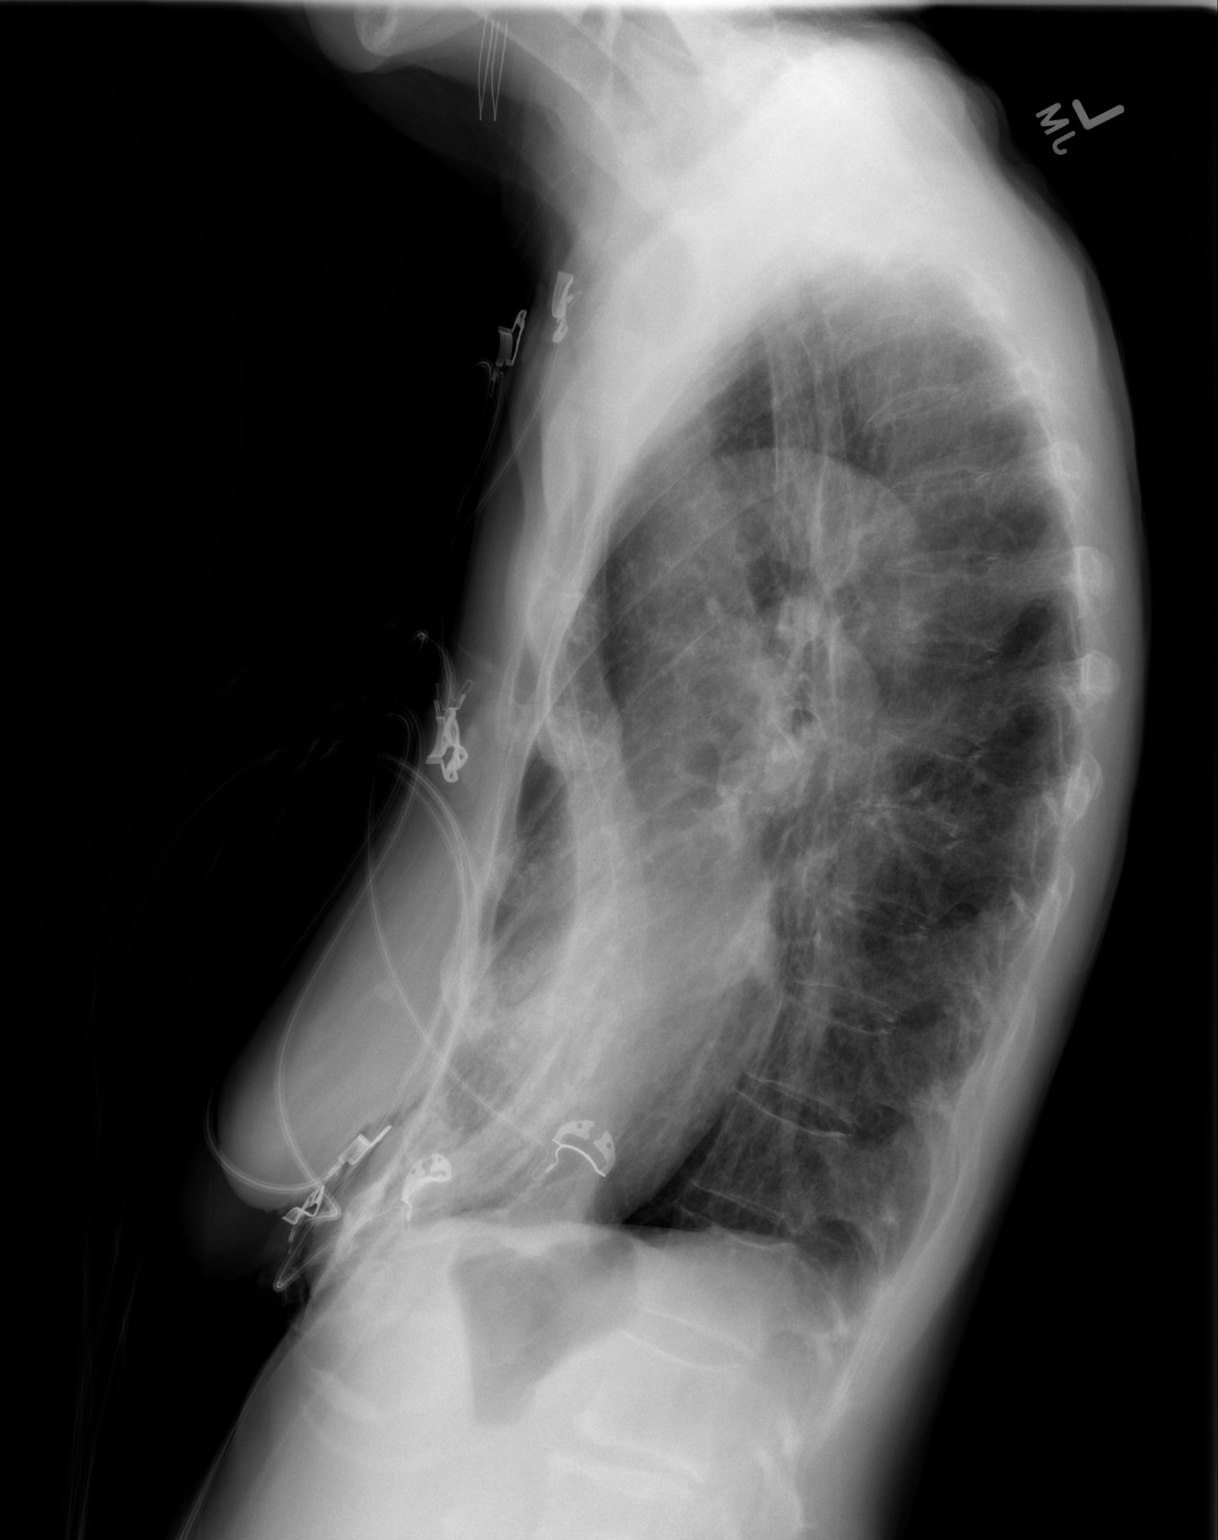

[2 of 2 positions shown; findings below may reference images not displayed]

FINDINGS: There is marked pectus excavatum. There is slight scarring in the
right mid lung. There is no edema or airspace opacity. No focal
nodular lesions evident. The heart size and pulmonary vascularity
within normal limits. No adenopathy.
IMPRESSION: Mild scarring right mid lung. Lungs elsewhere clear. Cardiac
silhouette within normal limits. No adenopathy appreciable. There is
marked pectus excavatum.

## 2019-09-03 IMAGING — US US ABDOMEN COMPLETE
1 series · 13 of 25 positions shown · non-contrast
Comparison: By report from [DATE].

CLINICAL DATA: Right upper quadrant pain

EXAM:
ABDOMEN ULTRASOUND COMPLETE

[Series 1: us abdomen complete · 55 acquisitions, 13 frames shown]
[im 1/55]
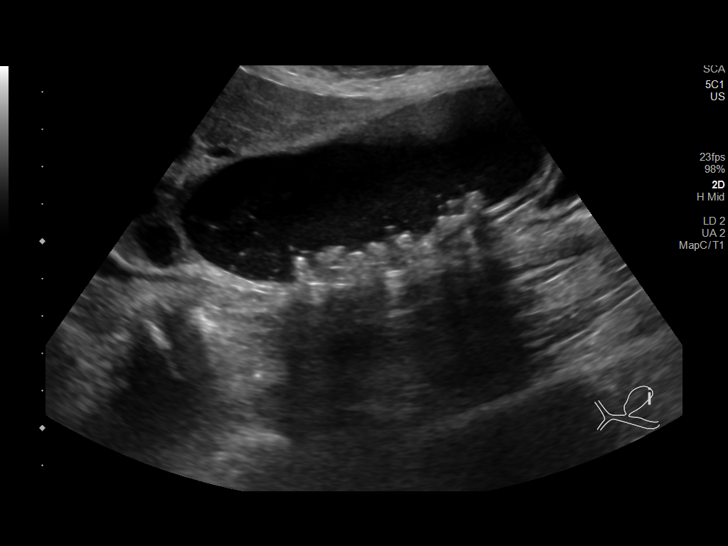
[im 5/55]
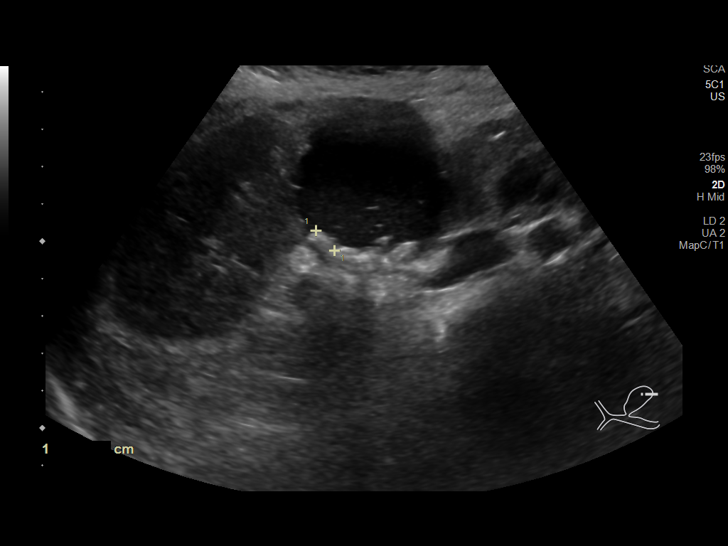
[im 10/55]
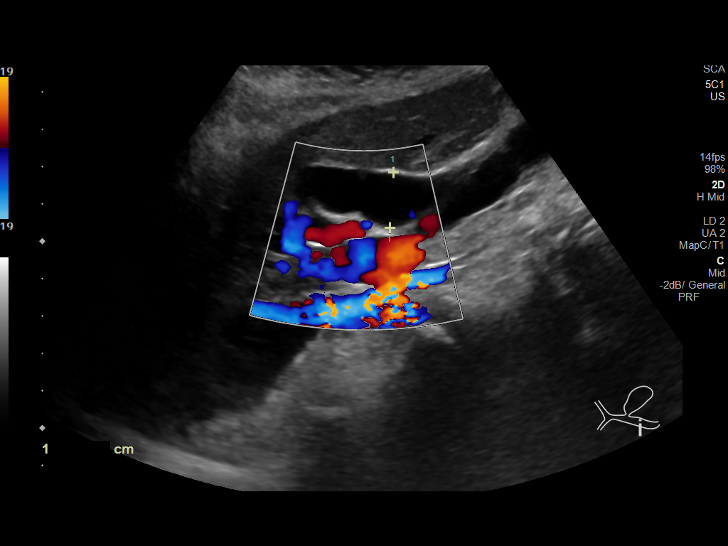
[im 14/55]
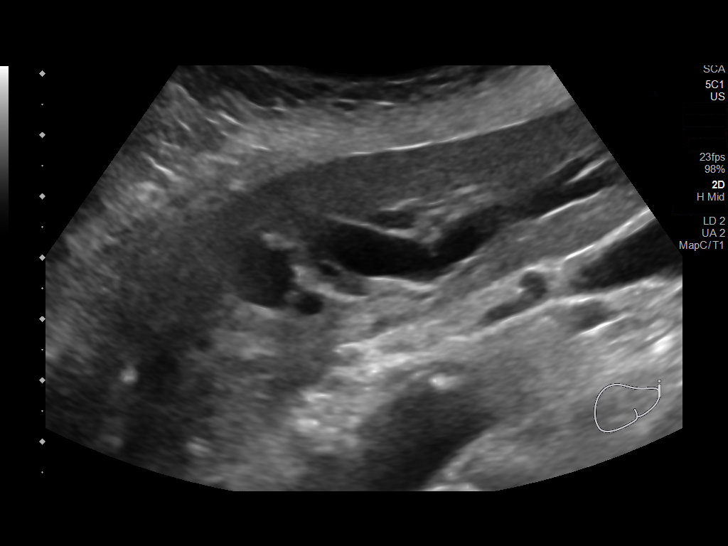
[im 19/55]
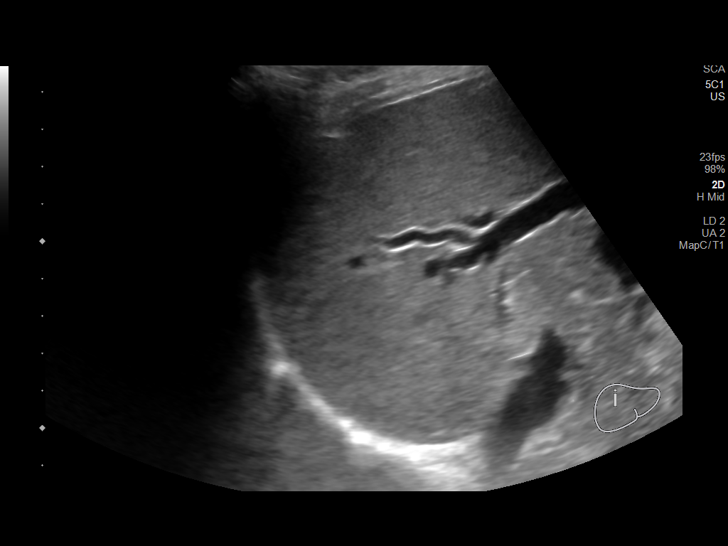
[im 23/55]
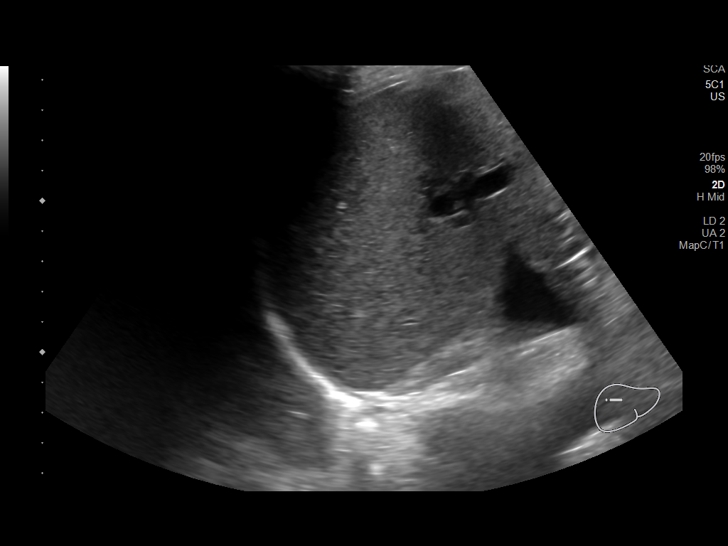
[im 28/55]
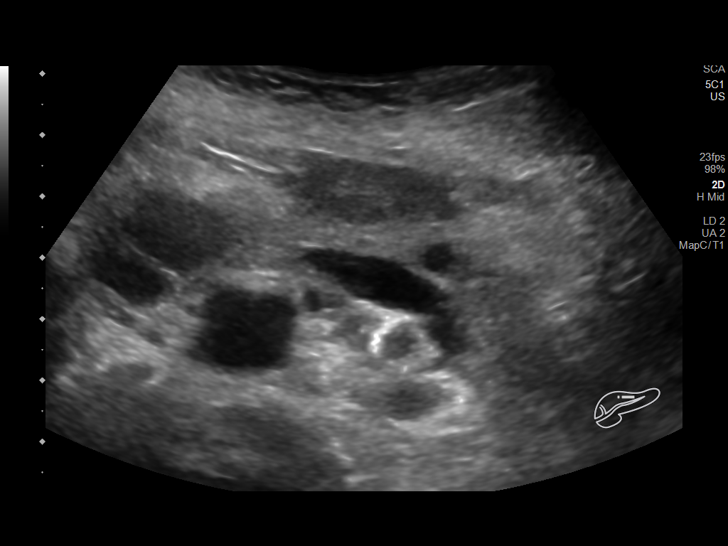
[im 32/55]
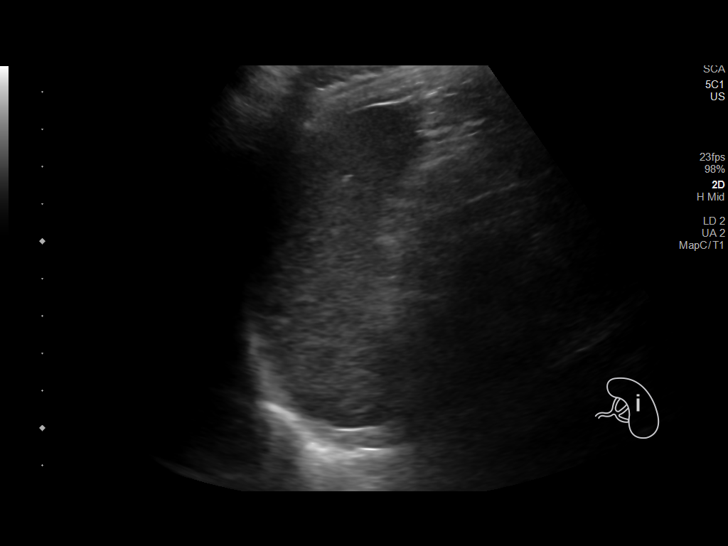
[im 37/55]
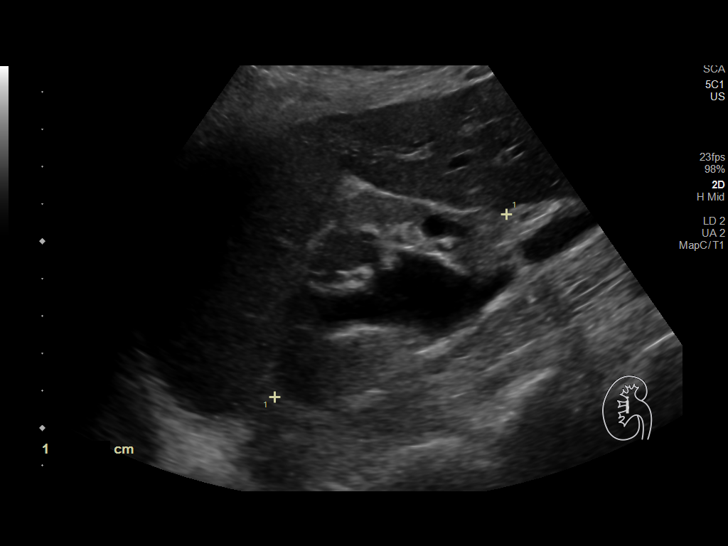
[im 41/55]
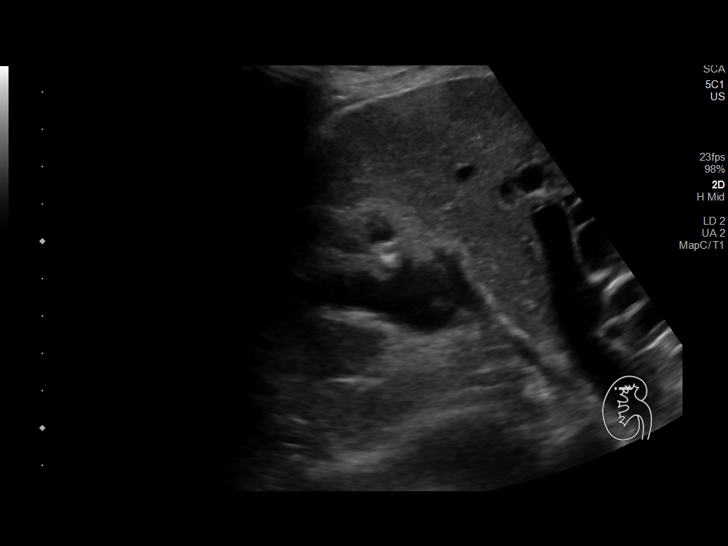
[im 46/55]
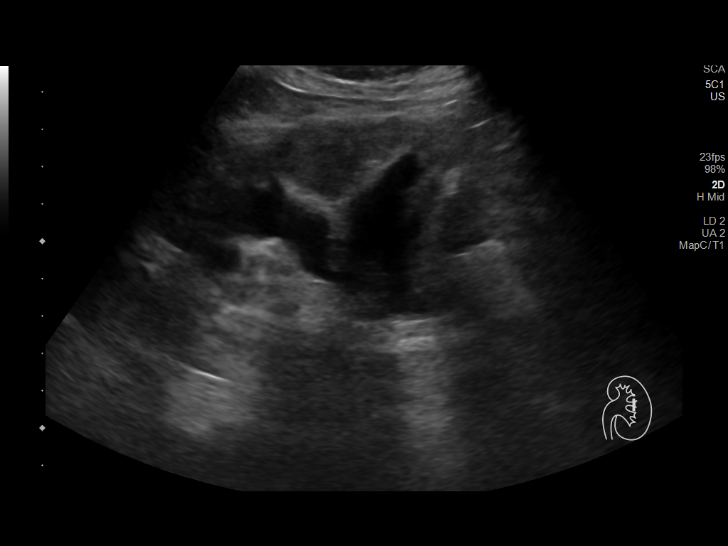
[im 50/55]
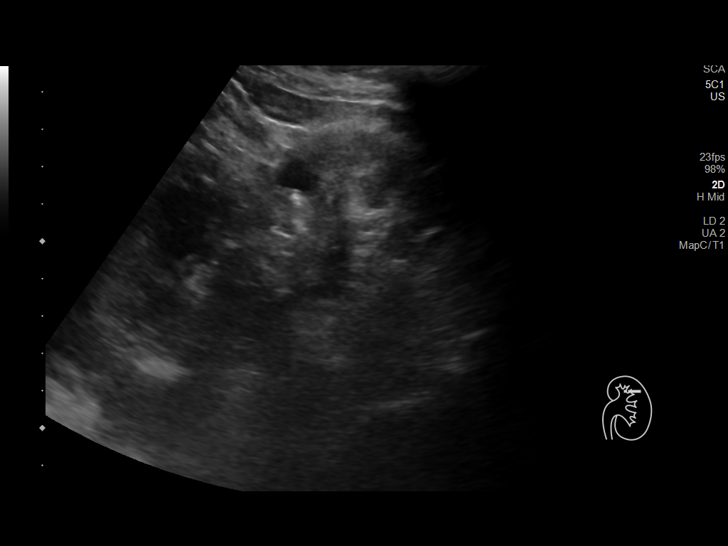
[im 55/55]
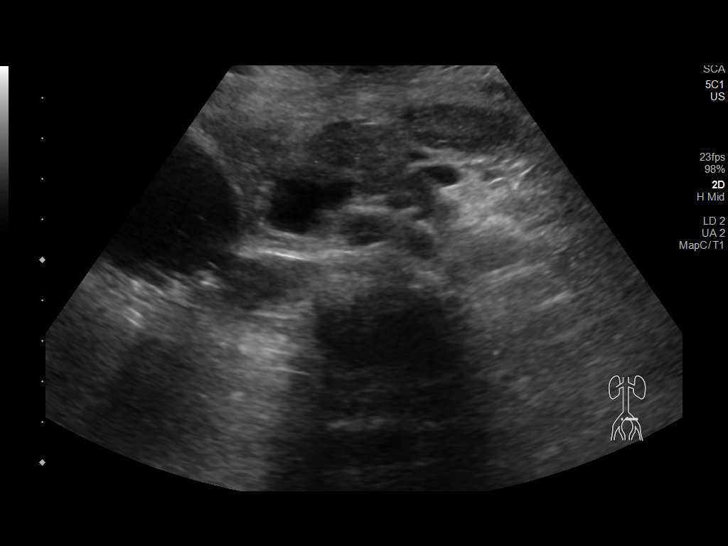

[13 of 25 positions shown; findings below may reference images not displayed]

FINDINGS: Gallbladder: Gallbladder is well distended with evidence of
gallbladder sludge and multiple gallstones. No wall thickening or
pericholecystic fluid is noted.

Common bile duct: Diameter: 14.9 mm with intrahepatic dilatation

Liver: No focal lesion identified. Within normal limits in
parenchymal echogenicity. Portal vein is patent on color Doppler
imaging with normal direction of blood flow towards the liver.
Significant intrahepatic ductal dilatation is noted.

IVC: No abnormality visualized.

Pancreas: Visualized portion unremarkable.

Spleen: Size and appearance within normal limits.

Right Kidney: Length: 7.9 cm. Chronic hydronephrosis is noted stable
by report from a prior CT of [Q9].

Left Kidney: Length: 9.4 cm. Chronic hydronephrosis is noted stable
from a prior CT examination.

Abdominal aorta: No aneurysm visualized.

Other findings: None.
IMPRESSION: Cholelithiasis and gallbladder sludge with significant intra and
extrahepatic biliary ductal dilatation. Correlation with laboratory
values is recommended. These changes are likely related to a distal
common bile duct stone. Further workup is recommended.

Chronic hydronephrosis bilaterally stable from a prior CT of [Q9]
consistent with the patient's known urinary diversion.

## 2019-09-03 MED ORDER — METOPROLOL TARTRATE 25 MG PO TABS
12.5000 mg | ORAL_TABLET | Freq: Two times a day (BID) | ORAL | Status: DC
  Administered 2019-09-03 – 2019-09-06 (×6): 12.5 mg via ORAL
  Filled 2019-09-03 (×6): qty 1

## 2019-09-03 MED ORDER — BOOST / RESOURCE BREEZE PO LIQD CUSTOM
1.0000 | Freq: Three times a day (TID) | ORAL | Status: DC
  Administered 2019-09-04 – 2019-09-06 (×2): 1 via ORAL

## 2019-09-03 MED ORDER — SODIUM BICARBONATE 650 MG PO TABS
1300.0000 mg | ORAL_TABLET | Freq: Two times a day (BID) | ORAL | Status: DC
  Administered 2019-09-03 – 2019-09-06 (×6): 1300 mg via ORAL
  Filled 2019-09-03 (×6): qty 2

## 2019-09-03 MED ORDER — LEVOTHYROXINE SODIUM 100 MCG PO TABS
100.0000 ug | ORAL_TABLET | Freq: Every day | ORAL | Status: DC
  Administered 2019-09-04 – 2019-09-06 (×3): 100 ug via ORAL
  Filled 2019-09-03 (×3): qty 1

## 2019-09-03 MED ORDER — SODIUM CHLORIDE 0.9 % IV SOLN: Freq: Once | INTRAVENOUS | Status: AC

## 2019-09-03 MED ORDER — SODIUM CHLORIDE 0.9 % IV SOLN
1.0000 g | Freq: Once | INTRAVENOUS | Status: AC
  Administered 2019-09-03: 1 g via INTRAVENOUS
  Filled 2019-09-03: qty 10

## 2019-09-03 MED ORDER — SODIUM CHLORIDE 0.9 % IV SOLN
1.0000 g | INTRAVENOUS | Status: DC
  Administered 2019-09-04 – 2019-09-05 (×2): 1 g via INTRAVENOUS
  Filled 2019-09-03: qty 1
  Filled 2019-09-03: qty 10
  Filled 2019-09-03: qty 1
  Filled 2019-09-03: qty 10

## 2019-09-03 MED ORDER — LEVETIRACETAM 500 MG PO TABS
500.0000 mg | ORAL_TABLET | Freq: Two times a day (BID) | ORAL | Status: DC
  Administered 2019-09-03 – 2019-09-06 (×6): 500 mg via ORAL
  Filled 2019-09-03 (×6): qty 1

## 2019-09-03 MED ORDER — POTASSIUM CHLORIDE 10 MEQ/100ML IV SOLN
10.0000 meq | Freq: Once | INTRAVENOUS | Status: AC
  Administered 2019-09-04: 10 meq via INTRAVENOUS
  Filled 2019-09-03: qty 100

## 2019-09-03 MED ORDER — MELATONIN 5 MG PO TABS
5.0000 mg | ORAL_TABLET | Freq: Every day | ORAL | Status: DC
  Administered 2019-09-03 – 2019-09-05 (×3): 5 mg via ORAL
  Filled 2019-09-03 (×3): qty 1

## 2019-09-03 NOTE — ED Triage Notes (Signed)
Pt here with new onset jaundice, abnormal UA from MD who sent her "for testing", and irregular bowel movements.

## 2019-09-03 NOTE — ED Notes (Signed)
ED Provider at bedside. 

## 2019-09-03 NOTE — Progress Notes (Signed)
Patient arrived from [place] via [place].

## 2019-09-03 NOTE — ED Notes (Signed)
Pt on monitor 

## 2019-09-03 NOTE — ED Provider Notes (Signed)
[facility] Provider Note   CSN: [id number] Arrival date & time: 09/03/19  1048     History Chief Complaint  Patient presents with  . Sent my MD  . Diarrhea  . Jaundice    Glenda King is a 86 y.o. female.  Presents to ER with chief complaint jaundice.  Over the past few weeks she has been feeling generally fatigued, weak.  Has had some intermittent loose stools, no watery stools, no blood in stools.  2 or 3 bowel movements yesterday.  Reports since yesterday has noticed yellowing of her skin.  Went to her primary doctor who recommended she come to ER for further testing.  Denies any associated abdominal pain.  Has history of bladder cancer many years ago, in remission.  HPI     Past Medical History:  Diagnosis Date  . Anemia   . Cancer [facility])    bladder cancer  . Cardiomyopathy ([place])   . CKD (chronic kidney disease)   . Thyroid disease    hypothyroid    Patient Active Problem List   Diagnosis Date Noted  . BLADDER CANCER 10/17/2007  . COLONIC POLYPS 10/17/2007  . HYPOTHYROIDISM 10/17/2007  . HYPERCHOLESTEROLEMIA, BORDERLINE 10/17/2007  . RENAL INSUFFICIENCY 10/17/2007    History reviewed. No pertinent surgical history.   OB History   No obstetric history on file.     History reviewed. No pertinent family history.  Social History   Tobacco Use  . Smoking status: Never Smoker  . Smokeless tobacco: Never Used  Substance Use Topics  . Alcohol use: Not Currently  . Drug use: Not Currently    Home Medications Prior to Admission medications   Medication Sig Start Date End Date Taking? Authorizing Provider  aspirin EC 81 MG tablet Take by mouth. 12/06/11   [provider]  D2000 ULTRA STRENGTH 50 MCG (2000 UT) CAPS Take 1 capsule by mouth daily. 03/18/19   [provider]  doxycycline (VIBRAMYCIN) 100 MG capsule Take 1 capsule (100 mg total) by mouth 2 (two) times daily. 05/19/19   Horton, Barbette Hair, MD    levETIRAcetam (KEPPRA) 500 MG tablet Take 500 mg by mouth 2 (two) times daily. 05/13/19   [provider]  levothyroxine (SYNTHROID) 100 MCG tablet Take 100 mcg by mouth daily. 05/13/19   [provider]  metroNIDAZOLE (FLAGYL) 500 MG tablet Take 1 tablet (500 mg total) by mouth 3 (three) times daily. 05/19/19   Horton, Barbette Hair, MD  Multiple Vitamins tablet Take 1 tablet by mouth daily. 03/18/19   [provider]  sodium bicarbonate 650 MG tablet Take 1,300 mg by mouth 2 (two) times daily. 03/12/19   [provider]    Allergies    Amoxicillin, Gadolinium derivatives, and Pollen extract  Review of Systems   Review of Systems  Constitutional: Negative for chills and fever.  HENT: Negative for ear pain and sore throat.   Eyes: Negative for pain and visual disturbance.  Respiratory: Negative for cough and shortness of breath.   Cardiovascular: Negative for chest pain and palpitations.  Gastrointestinal: Negative for abdominal pain and vomiting.  Genitourinary: Negative for dysuria and hematuria.  Musculoskeletal: Negative for arthralgias and back pain.  Skin: Positive for color change. Negative for rash.  Neurological: Negative for seizures and syncope.  All other systems reviewed and are negative.   Physical Exam Updated Vital Signs BP (!) 149/64 (BP Location: Right Arm)   Pulse 74   Temp 98.3 F (  36.8 C) (Oral)   Resp 20   SpO2 97%   Physical Exam Vitals and nursing note reviewed.  Constitutional:      General: She is not in acute distress.    Appearance: She is well-developed.  HENT:     Head: Normocephalic and atraumatic.  Eyes:     General: Scleral icterus present.     Conjunctiva/sclera: Conjunctivae normal.  Cardiovascular:     Rate and Rhythm: Normal rate and regular rhythm.     Heart sounds: No murmur.  Pulmonary:     Effort: Pulmonary effort is normal. No respiratory distress.     Breath sounds: Normal breath sounds.   Abdominal:     Palpations: Abdomen is soft.     Tenderness: There is no abdominal tenderness.  Musculoskeletal:        General: No deformity or signs of injury.     Cervical back: Neck supple.  Skin:    General: Skin is warm and dry.     Capillary Refill: Capillary refill takes less than 2 seconds.     Coloration: Skin is jaundiced.  Neurological:     General: No focal deficit present.     Mental Status: She is alert and oriented to person, place, and time.  Psychiatric:        Mood and Affect: Mood normal.        Behavior: Behavior normal.     ED Results / Procedures / Treatments   Labs (all labs ordered are listed, but only abnormal results are displayed) Labs Reviewed  CBC WITH DIFFERENTIAL/PLATELET  BASIC METABOLIC PANEL  HEPATIC FUNCTION PANEL  URINALYSIS, ROUTINE W REFLEX MICROSCOPIC  LIPASE, BLOOD    EKG EKG Interpretation  Date/Time:  Wednesday September 03 2019 11:05:29 EDT Ventricular Rate:  61 PR Interval:    QRS Duration: 92 QT Interval:  414 QTC Calculation: 417 R Axis:   59 Text Interpretation: Sinus rhythm Borderline low voltage, extremity leads RSR' in V1 or V2, right VCD or RVH Nonspecific T abnormalities, lateral leads Confirmed by Madalyn Rob [phone]) on 09/03/2019 11:10:50 AM   Radiology No results found.  Procedures .Critical Care Performed by: Lucrezia Starch, MD Authorized by: Lucrezia Starch, MD   Critical care provider statement:    Critical care time (minutes):  35   Critical care was time spent personally by me on the following activities:  Discussions with consultants, evaluation of patient's response to treatment, examination of patient, ordering and performing treatments and interventions, ordering and review of laboratory studies, ordering and review of radiographic studies, pulse oximetry, re-evaluation of patient's condition, obtaining history from patient or surrogate and review of old charts   (including critical care  time)  Medications Ordered in ED Medications - No data to display  ED Course  I have reviewed the triage vital signs and the nursing notes.  Pertinent labs & imaging results that were available during my care of the patient were reviewed by me and considered in my medical decision making (see chart for details).  Clinical Course as of Sep 02 1432  Wed Sep 03, 2019  1400 D/w Velora Heckler NP, rec hosp admit, GI to see once at [facility], likely needs ERCP   [RD]  1427 D/w [place] who will admit   [RD]    Clinical Course User Index [RD] Lucrezia Starch, MD   MDM Rules/Calculators/A&P  86 year old lady presenting to ER with jaundice.  On exam patient noted to have marked jaundice, very yellow skin and scleral icterus.  She otherwise was well-appearing and had stable vital signs.  No focal abdominal tenderness or pain.  Lab work concerning for profoundly elevated bilirubin.  Right upper quadrant ultrasound concerning for cholelithiasis, likely distal CBD stone, dilated common bile duct.  Discussed case with gastroenterology who recommends hospitalist admission, likely will need ERCP or MRCP.  Discussed findings with patient, agreeable to admission.  Started ceftriaxone for suspected UTI.  Started maintenance IV fluids.  Dr. [place] to admit for [facility].  Final Clinical Impression(s) / ED Diagnoses Final diagnoses:  Common bile duct stone  Dilated cbd, acquired  Renal failure, unspecified chronicity  Transaminitis  Urinary tract infection with hematuria, site unspecified  Biliary obstruction    Rx / DC Orders ED Discharge Orders    None       Lucrezia Starch, MD 09/03/19 1444

## 2019-09-03 NOTE — Progress Notes (Signed)
ATLEY SCARBORO is an 86 year old female who presented to [facility] with fatigue and was found to be jaundiced.  Work-up significant for elevated AST/ALT with total bilirubin of 16.1.  Ultrasound abdomen with cholelithiasis and intrahepatic/extrahepatic biliary ductal dilation.  Lipase 264.  Also notable for UTI.  Case was discussed by ED physician with [facility] who recommended transfer to [facility] for ERCP.  Will need a general surgery consultation on arrival.  Started on antibiotics with ceftriaxone.  Please call manager of [facility] at [phone] when pt arrives to floor  Ponciano Shealy [place] DO

## 2019-09-03 NOTE — H&P (Signed)
History and Physical    Glenda King FXT:[id number] DOB: August 01, 1928 DOA: 09/03/2019  PCP: Patient, No Pcp Per  Patient coming from: [place] ED  I have personally briefly reviewed patient's old medical records in [place]  Chief Complaint: Abnormal urine  HPI: Glenda King is a 86 y.o. 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  Saw PCP today after seeing urine change to orange color and was told to go to ED for evaluation. Has also noted increase frequency of her bowel movement but no diarrhea.  She denies any abdominal pain. No nausea or vomiting. No changes in appetite.  No dysuria, increased frequency or urgency.  No fever.   ED Course: She was afebrile and normotensive on room air.  CBC with no leukocytosis.  Stable anemia with hemoglobin of 9.5.  Potassium of 3.1, creatinine of 2.11 with no recent baseline for comparison but last known creatinine was 1.95 in 2018. Lipase of 264, ALT of 209, AST of 169.  Total bilirubin of 16.1.  Right upper quadrant ultrasound showed distended gallbladder with gallbladder sludge and multiple gallstones.  No wall thickening or pericholecystic fluid.  Common bile duct dilated to 14.67mm with intrahepatic and extrahepatic dilatation.  There is also chronic hydronephrosis bilaterally that stable from 2015.  [facility] was consulted by ED physician and will do ERCP in the morning. Patient was transfer to [facility] for ERCP and surgical consultation with hospitalist admission.  She was started on Rocephin for UTI.    Review of Systems:  Constitutional: No Weight Change, No Fever ENT/Mouth: No sore throat, No Rhinorrhea Eyes: No Eye Pain, No Vision Changes Cardiovascular: No Chest  Pain, no SOB Respiratory: No Cough, No Sputum,   Gastrointestinal: No Nausea, No Vomiting, No Diarrhea, No Constipation, No Pain Genitourinary: no Urinary Incontinence, No Urgency, No Flank Pain Musculoskeletal: No Arthralgias, No Myalgias Skin: No Skin Lesions, No Pruritus, Neuro: no Weakness, No Numbness Psych: No Anxiety/Panic, No Depression, no decrease appetite Heme/Lymph: No Bruising, No Bleeding  Past Medical History:  Diagnosis Date  . Anemia   . Cancer [facility])    bladder cancer  . Cardiomyopathy ([place])   . CKD (chronic kidney disease)   . Thyroid disease    hypothyroid    History reviewed. No pertinent surgical history.   reports that she has never smoked. She has never used smokeless tobacco. She reports previous alcohol use. She reports previous drug use.  Allergies  Allergen Reactions  . Amoxicillin     Pt unsure of reaction     . Gadolinium Derivatives Other (See Comments)    Secondary to her stage IV kidney disease Secondary to her stage IV kidney disease   . Pollen Extract Other (See Comments)    Sneezing    History reviewed. No pertinent family history.   Prior to Admission medications   Medication Sig Start Date End Date Taking? Authorizing Provider  aspirin EC 81 MG tablet Take 81 mg by mouth every evening.  12/06/11  Yes [provider]  co-enzyme Q-10 30 MG capsule Take 30 mg by mouth every evening.   Yes [provider]  D2000 ULTRA STRENGTH 50 MCG (2000 UT) CAPS Take 1 capsule by mouth in the morning and at bedtime.  03/18/19  Yes [provider]  levETIRAcetam (KEPPRA) 500 MG tablet Take 500 mg by mouth 2 (two) times daily. 05/13/19  Yes [provider]  levothyroxine (SYNTHROID) 100 MCG tablet Take 100 mcg by mouth daily. 05/13/19  Yes [provider]  melatonin 5 MG TABS Take 5 mg by mouth at bedtime.    Yes [provider]  metoprolol tartrate (LOPRESSOR) 25 MG tablet Take 12.5 mg by mouth 2  (two) times daily.  02/23/14  Yes [provider]  sodium bicarbonate 650 MG tablet Take 1,300 mg by mouth 2 (two) times daily. 03/12/19  Yes [provider]    Physical Exam: Vitals:   09/03/19 1800 09/03/19 1900 09/03/19 2052 09/03/19 2111  BP: 133/86 131/68 136/70   Pulse: 67 65 71   Resp: 18 19 18    Temp:   97.8 F (36.6 C)   TempSrc:   Oral   SpO2: 95% 95% 98%   Weight:    44.9 kg  Height:    5' 6.5" (1.689 m)    Constitutional: NAD, calm, comfortable, thin elderly female sitting at 40 degree incline in bed Vitals:   09/03/19 1800 09/03/19 1900 09/03/19 2052 09/03/19 2111  BP: 133/86 131/68 136/70   Pulse: 67 65 71   Resp: 18 19 18    Temp:   97.8 F (36.6 C)   TempSrc:   Oral   SpO2: 95% 95% 98%   Weight:    44.9 kg  Height:    5' 6.5" (1.689 m)   Eyes: PERRL, lids and conjunctivae normal ENMT: Mucous membranes are moist.  Neck: normal, supple Respiratory: clear to auscultation bilaterally, no wheezing, no crackles. Normal respiratory effort on room air. No accessory muscle use.  Cardiovascular: Regular rate and rhythm, no murmurs / rubs / gallops. No extremity edema. 2+ pedal pulses. Abdomen: moderate tenderness to RUQ, negative Murphy sign, no masses palpated. Bowel sounds positive. Clean RLQ urinary diversion stoma without any erythema or drainage.  Musculoskeletal: no clubbing / cyanosis. No joint deformity upper and lower extremities. Good ROM, no contractures. Normal muscle tone.  Skin: no rashes, lesions, ulcers. No induration Neurologic: CN 2-12 grossly intact. Sensation intact, Strength 5/5 in all 4.  Psychiatric: Normal judgment and insight. Alert and oriented x 3. Normal mood.     Labs on Admission: I have personally reviewed following labs and imaging studies  CBC: Recent Labs  Lab 09/03/19 1130  WBC 5.0  NEUTROABS 4.2  HGB 9.5*  HCT 28.4*  MCV 95.3  PLT 563   Basic Metabolic Panel: Recent Labs  Lab 09/03/19 1130  NA 136    K 3.1*  CL 108  CO2 18*  GLUCOSE 147*  BUN 29*  CREATININE 2.11*  CALCIUM 8.9   GFR: Estimated Creatinine Clearance: 13.1 mL/min (A) (by C-G formula based on SCr of 2.11 mg/dL (H)). Liver Function Tests: Recent Labs  Lab 09/03/19 1130  AST 169*  ALT 209*  ALKPHOS 410*  BILITOT 16.1*  PROT 6.9  ALBUMIN 2.9*   Recent Labs  Lab 09/03/19 1130  LIPASE 264*   No results for input(s): AMMONIA in the last 168 hours. Coagulation Profile: No results for input(s): INR, PROTIME in the last 168 hours. Cardiac Enzymes: No results for input(s): CKTOTAL, CKMB, CKMBINDEX, TROPONINI in the last 168 hours. BNP (last 3 results) No results for input(s): PROBNP in the last 8760 hours. HbA1C: No results for input(s): HGBA1C in the last 72 hours. CBG: No results for input(s): GLUCAP in the last 168 hours. Lipid  Profile: No results for input(s): CHOL, HDL, LDLCALC, TRIG, CHOLHDL, LDLDIRECT in the last 72 hours. Thyroid Function Tests: No results for input(s): TSH, T4TOTAL, FREET4, T3FREE, THYROIDAB in the last 72 hours. Anemia Panel: No results for input(s): VITAMINB12, FOLATE, FERRITIN, TIBC, IRON, RETICCTPCT in the last 72 hours. Urine analysis:    Component Value Date/Time   COLORURINE AMBER (A) 09/03/2019 1226   APPEARANCEUR CLOUDY (A) 09/03/2019 1226   LABSPEC 1.010 09/03/2019 1226   PHURINE 7.0 09/03/2019 1226   GLUCOSEU NEGATIVE 09/03/2019 1226   HGBUR SMALL (A) 09/03/2019 1226   BILIRUBINUR LARGE (A) 09/03/2019 1226   KETONESUR NEGATIVE 09/03/2019 1226   PROTEINUR 30 (A) 09/03/2019 1226   NITRITE NEGATIVE 09/03/2019 1226   LEUKOCYTESUR LARGE (A) 09/03/2019 1226    Radiological Exams on Admission: DG Chest 2 View  Result Date: 09/03/2019 CLINICAL DATA:  Generalized weakness EXAM: CHEST - 2 VIEW COMPARISON:  March 06, 2012 FINDINGS: There is marked pectus excavatum. There is slight scarring in the right mid lung. There is no edema or airspace opacity. No focal nodular  lesions evident. The heart size and pulmonary vascularity within normal limits. No adenopathy. IMPRESSION: Mild scarring right mid lung. Lungs elsewhere clear. Cardiac silhouette within normal limits. No adenopathy appreciable. There is marked pectus excavatum. Electronically Signed   By: Lowella Grip III M.D.   On: 09/03/2019 12:12   US Abdomen Complete  Result Date: 09/03/2019 CLINICAL DATA:  Right upper quadrant pain EXAM: ABDOMEN ULTRASOUND COMPLETE COMPARISON:  By report from 02/05/2014. FINDINGS: Gallbladder: Gallbladder is well distended with evidence of gallbladder sludge and multiple gallstones. No wall thickening or pericholecystic fluid is noted. Common bile duct: Diameter: 14.9 mm with intrahepatic dilatation Liver: No focal lesion identified. Within normal limits in parenchymal echogenicity. Portal vein is patent on color Doppler imaging with normal direction of blood flow towards the liver. Significant intrahepatic ductal dilatation is noted. IVC: No abnormality visualized. Pancreas: Visualized portion unremarkable. Spleen: Size and appearance within normal limits. Right Kidney: Length: 7.9 cm. Chronic hydronephrosis is noted stable by report from a prior CT of 2015. Left Kidney: Length: 9.4 cm. Chronic hydronephrosis is noted stable from a prior CT examination. Abdominal aorta: No aneurysm visualized. Other findings: None. IMPRESSION: Cholelithiasis and gallbladder sludge with significant intra and extrahepatic biliary ductal dilatation. Correlation with laboratory values is recommended. These changes are likely related to a distal common bile duct stone. Further workup is recommended. Chronic hydronephrosis bilaterally stable from a prior CT of 2015 consistent with the patient's known urinary diversion. Electronically Signed   By: Inez Catalina M.D.   On: 09/03/2019 12:37    EKG: Independently reviewed.   Assessment/Plan Acute choledocholithiasis/cholelithiasis [facility] consulted by ED  physician and will perform ERCP in the morning Surgery also consulted overnight and asked to re-consult in the morning Keep NPO after midnight Follow CMP in the morning   Questionable UTI with hx of urinary diversion requiring self-cath due to hx of bladder cancer  Continue Rocephin for now pending final urine culture - although unsure if urine culture was obtained prior to antibiotics being given  Hypokalemia  replete  Hx of seizure Continue Keppra   Chronic kidney disease stage IV with chronic hydronephrosis No recent baseline to compare but creatinine is not much elevated from several years ago. Continue to avoid nephrotoxic agent  Hypothyroidism Continue levothyroxine  DVT prophylaxis: SCDs Code Status: Full Family Communication: Plan discussed with patient at bedside  disposition Plan: Home with at least 2  midnight stays  Consults called: [facility], needs to consult surgery in the morning Admission status: inpatient  [place] Mall T Trysten Berti DO [facility]   If 7PM-7AM, please contact night-coverage [email or web address]   09/03/2019, 10:17 PM

## 2019-09-04 ENCOUNTER — Inpatient Hospital Stay (HOSPITAL_COMMUNITY): Payer: Medicare Other

## 2019-09-04 DIAGNOSIS — K8050 Calculus of bile duct without cholangitis or cholecystitis without obstruction: Secondary | ICD-10-CM

## 2019-09-04 DIAGNOSIS — K8020 Calculus of gallbladder without cholecystitis without obstruction: Secondary | ICD-10-CM

## 2019-09-04 DIAGNOSIS — K831 Obstruction of bile duct: Secondary | ICD-10-CM

## 2019-09-04 DIAGNOSIS — R17 Unspecified jaundice: Secondary | ICD-10-CM

## 2019-09-04 LAB — CBC
HCT: 27.1 % — ABNORMAL LOW (ref 36.0–46.0)
Hemoglobin: 8.9 g/dL — ABNORMAL LOW (ref 12.0–15.0)
MCH: 31.9 pg (ref 26.0–34.0)
MCHC: 32.8 g/dL (ref 30.0–36.0)
MCV: 97.1 fL (ref 80.0–100.0)
Platelets: 136 10*3/uL — ABNORMAL LOW (ref 150–400)
RBC: 2.79 MIL/uL — ABNORMAL LOW (ref 3.87–5.11)
RDW: 17 % — ABNORMAL HIGH (ref 11.5–15.5)
WBC: 5.6 10*3/uL (ref 4.0–10.5)
nRBC: 0 % (ref 0.0–0.2)

## 2019-09-04 LAB — HEPATIC FUNCTION PANEL
ALT: 199 U/L — ABNORMAL HIGH (ref 0–44)
AST: 182 U/L — ABNORMAL HIGH (ref 15–41)
Albumin: 2.7 g/dL — ABNORMAL LOW (ref 3.5–5.0)
Alkaline Phosphatase: 457 U/L — ABNORMAL HIGH (ref 38–126)
Bilirubin, Direct: 12.9 mg/dL — ABNORMAL HIGH (ref 0.0–0.2)
Indirect Bilirubin: 3.2 mg/dL — ABNORMAL HIGH (ref 0.3–0.9)
Total Bilirubin: 16.1 mg/dL — ABNORMAL HIGH (ref 0.3–1.2)
Total Protein: 6.2 g/dL — ABNORMAL LOW (ref 6.5–8.1)

## 2019-09-04 LAB — COMPREHENSIVE METABOLIC PANEL
ALT: 186 U/L — ABNORMAL HIGH (ref 0–44)
AST: 157 U/L — ABNORMAL HIGH (ref 15–41)
Albumin: 2.7 g/dL — ABNORMAL LOW (ref 3.5–5.0)
Alkaline Phosphatase: 421 U/L — ABNORMAL HIGH (ref 38–126)
Anion gap: 11 (ref 5–15)
BUN: 27 mg/dL — ABNORMAL HIGH (ref 8–23)
CO2: 16 mmol/L — ABNORMAL LOW (ref 22–32)
Calcium: 8.7 mg/dL — ABNORMAL LOW (ref 8.9–10.3)
Chloride: 111 mmol/L (ref 98–111)
Creatinine, Ser: 1.9 mg/dL — ABNORMAL HIGH (ref 0.44–1.00)
GFR calc Af Amer: 27 mL/min — ABNORMAL LOW (ref >60)
GFR calc non Af Amer: 23 mL/min — ABNORMAL LOW (ref >60)
Glucose, Bld: 93 mg/dL (ref 70–99)
Potassium: 3.3 mmol/L — ABNORMAL LOW (ref 3.5–5.1)
Sodium: 138 mmol/L (ref 135–145)
Total Bilirubin: 15.4 mg/dL — ABNORMAL HIGH (ref 0.3–1.2)
Total Protein: 6 g/dL — ABNORMAL LOW (ref 6.5–8.1)

## 2019-09-04 LAB — LIPASE, BLOOD: Lipase: 278 U/L — ABNORMAL HIGH (ref 11–51)

## 2019-09-04 LAB — PROTIME-INR
INR: 1.1 (ref 0.8–1.2)
Prothrombin Time: 14.5 seconds (ref 11.4–15.2)

## 2019-09-04 IMAGING — MR MR MRCP
8 of 11 series · 30 of 48 positions shown · non-contrast
Comparison: Abdominal ultrasound [DATE]

CLINICAL DATA: Jaundice. Gallstones. Biliary obstruction. Bladder
cancer. Kidney disease. Chronic hydronephrosis.

EXAM:
MRI ABDOMEN WITHOUT CONTRAST  (INCLUDING MRCP)
TECHNIQUE: Multiplanar multisequence MR imaging of the abdomen was performed.
Heavily T2-weighted images of the biliary and pancreatic ducts were
obtained, and three-dimensional MRCP images were rendered by post
processing.

[Series 3: T2 fat-sat · axial · 6.0mm · 1.25mm/px · z∈[-274,-50]mm · 2 of 32 slices shown]
[im 1/32]
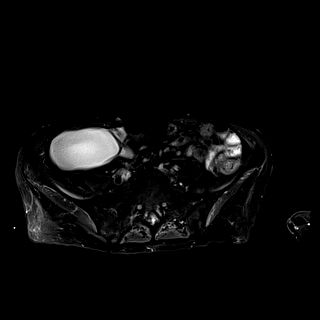
[im 32/32]
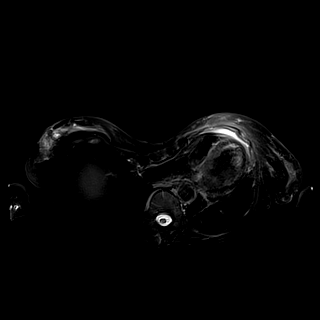

[Series 6: bSSFP · coronal · 6.0mm · 0.74mm/px · 3 of 35 slices shown]
[im 1/35]
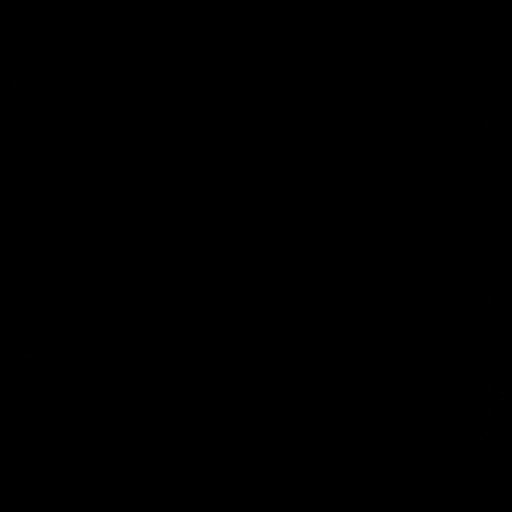
[im 18/35]
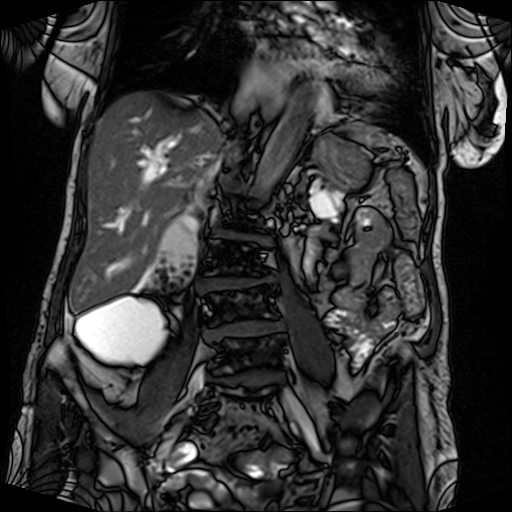
[im 35/35]
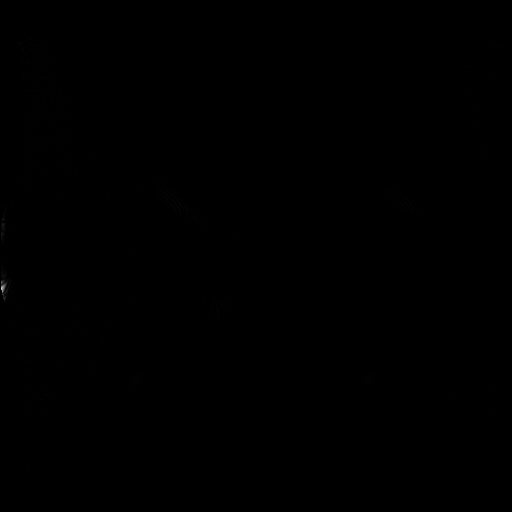

[Series 7: DWI · axial · 6.0mm · 1.49mm/px · z∈[-285,-33]mm · 6 of 71 slices shown (1 of 2)]
[im 1/71]
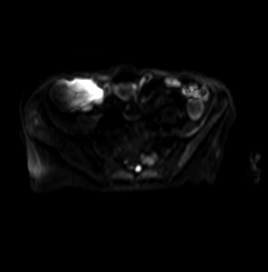
[im 15/71]
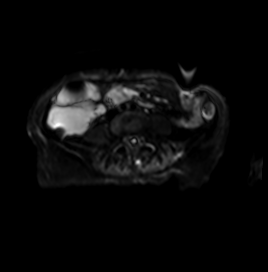
[im 29/71]
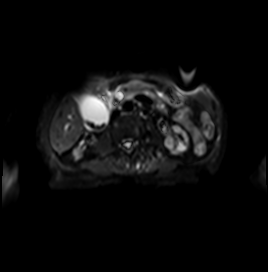
[im 43/71]
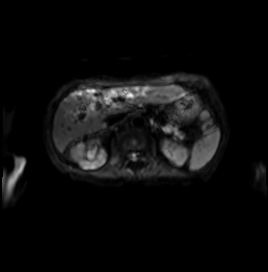
[im 57/71]
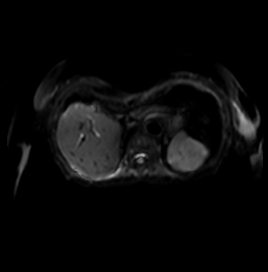
[im 71/71]
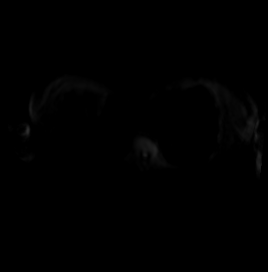

[Series 8: DWI · axial · 6.0mm · 1.49mm/px · z∈[-285,-33]mm · 3 of 36 slices shown (2 of 2)]
[im 1/36]
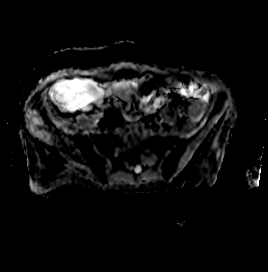
[im 18/36]
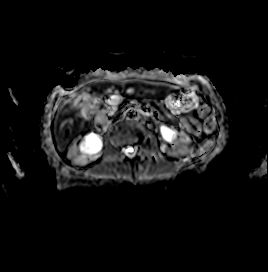
[im 36/36]
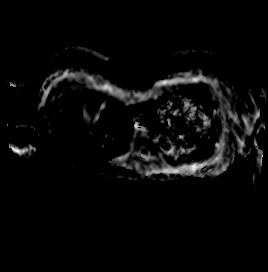

[Series 9: T1 · axial · 3.0mm · 1.25mm/px · z∈[-256,-43]mm · 6 of 72 slices shown (1 of 2)]
[im 1/72]
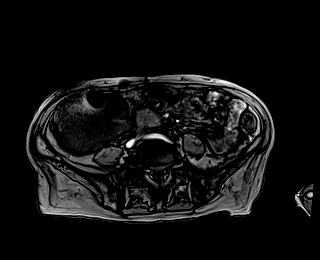
[im 15/72]
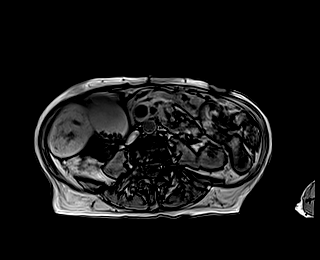
[im 29/72]
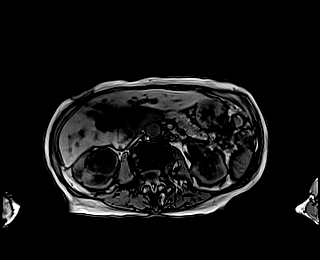
[im 43/72]
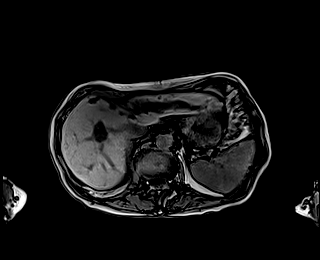
[im 57/72]
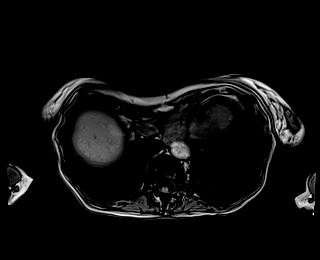
[im 72/72]
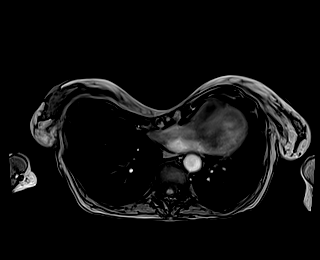

[Series 10: T1 · axial · 3.0mm · 1.25mm/px · z∈[-256,-43]mm · 6 of 72 slices shown (2 of 2)]
[im 1/72]
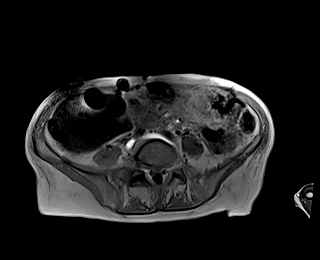
[im 15/72]
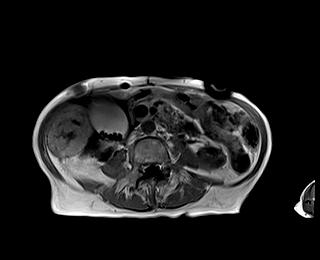
[im 29/72]
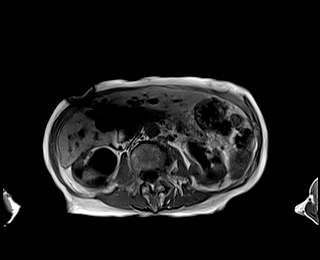
[im 43/72]
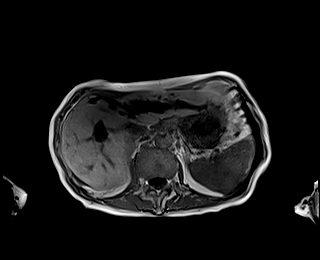
[im 57/72]
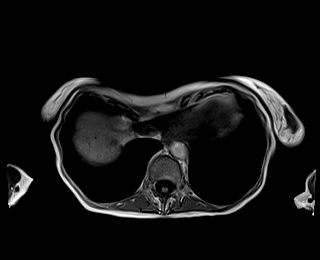
[im 72/72]
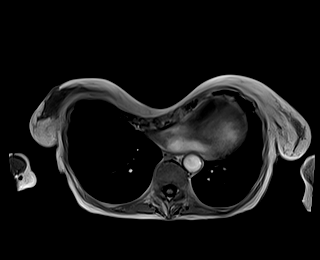

[Series 11: cor obl thk · sagittal · 50.0mm · 0.78mm/px · 1 of 9 slices shown]
[im 1/9]
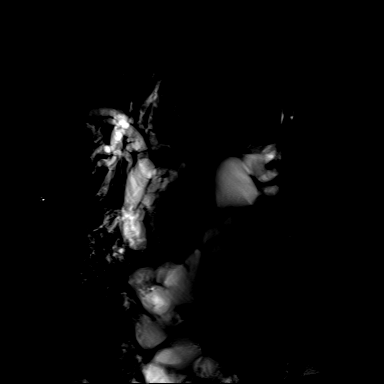

[Series 13: cor_3d_spc_trig · coronal · 1.0mm · 0.49mm/px · 3 of 80 slices shown]
[im 1/80]
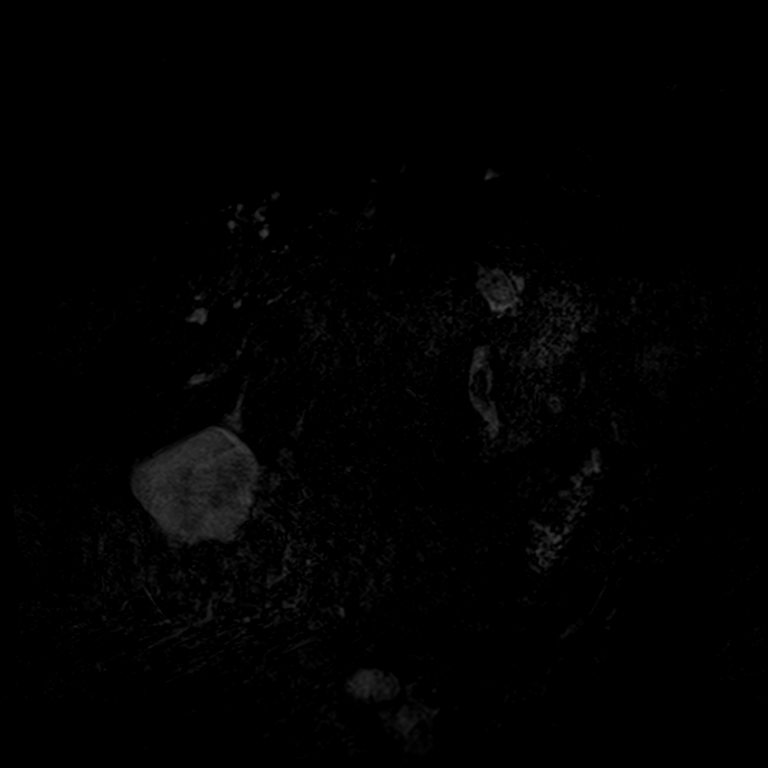
[im 14/80]
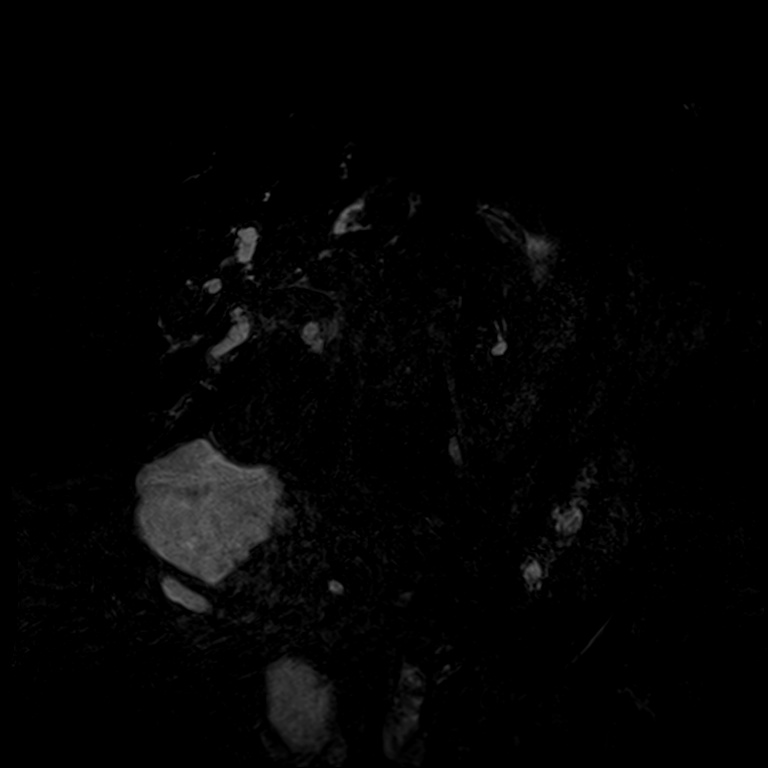
[im 27/80]
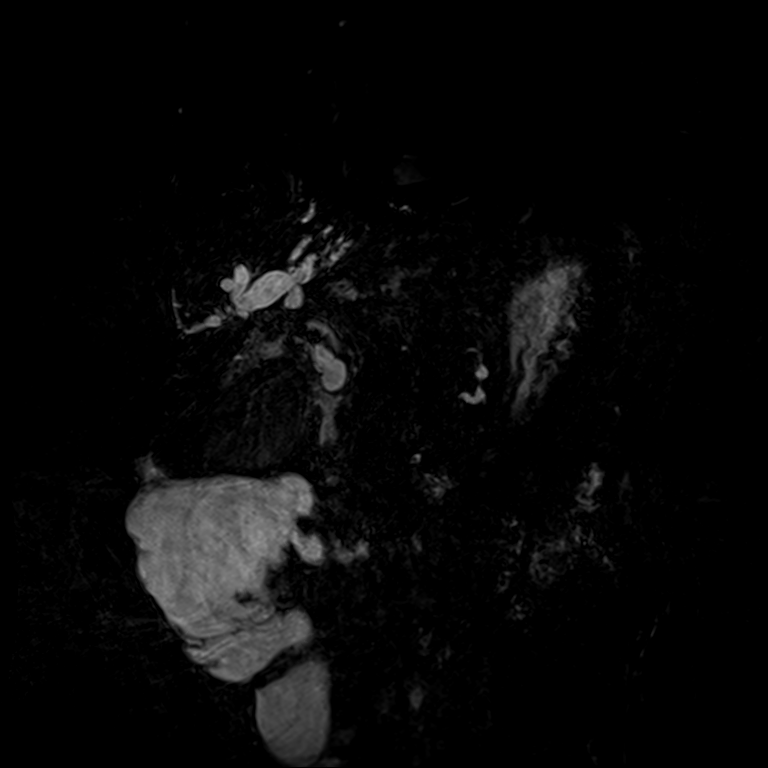

[30 of 48 positions shown; findings below may reference images not displayed]

FINDINGS: Despite efforts by the technologist and patient, motion artifact is
present on today's exam and could not be eliminated. This reduces
exam sensitivity and specificity.

Lower chest: Pectus excavatum.

Hepatobiliary: Distended gallbladder with mild gallbladder wall
thickening and numerous gallstones measuring up to about 0.7 cm in
diameter.

Prominent intrahepatic biliary dilatation. The common hepatic duct
measures 1.8 cm in diameter and the common bile duct measures 1.4 cm
in diameter. The dilated CBD extends down to what appears to be a
cluster of at least 2 small stones in the distal CBD for example as
shown on image 29/15 and likewise suggested on image 46/13. Image
[DATE] likewise demonstrates 2-3 small low T2 signal intensities at
the obstruction point favoring stones. These measure up to about
cm individually.

Pancreas: There is dilation of the dorsal pancreatic duct which is
new compared to [DATE]. Clustered cystic lesions along the
pancreatic tail, the largest 1.9 by 1.6 by 1.9 cm on image [DATE],
previously about 1.2 by 1.0 by 1.1 cm on [DATE]. The other
clustered cystic lesions along the tip of the pancreatic tail.

Spleen:  Unremarkable

Adrenals/Urinary Tract: Bilateral hydronephrosis and renal atrophy
with some calyceal diverticula. This is a chronic appearance

Stomach/Bowel: Unremarkable

Vascular/Lymphatic:  Grossly unremarkable

Other:  No supplemental non-categorized findings.

Musculoskeletal: Dextroconvex lumbar scoliosis.
IMPRESSION: 1. Choledocholithiasis with a cluster of small stones in the distal
CBD measuring individually up to about 0.3 cm in diameter. The
dilated CBD measures 1.4 cm in diameter.
2. Distended gallbladder with numerous gallstones measuring up to
about 0.7 cm in diameter, and mild gallbladder wall thickening and
pericholecystic fluid.
3. There is also dilation of the dorsal pancreatic duct which is new
compared to [DATE].
4. Several clustered cystic lesions along the tip of the pancreatic
tail, the largest measuring 1.9 by 1.6 by 1.9 cm, previously 1.2 by
1.0 by 1.1 cm on [DATE]. Possibilities include postinflammatory
cystic lesions or intraductal mucinous neoplasm. Given the size,
patient age, and mild degree of progression over the intervening
years, surveillance imaging by pancreatic protocol MRI in 2 years
time is recommended. This recommendation follows ACR consensus
guidelines: Management of Incidental Pancreatic Cysts: A White Paper
of the ACR Incidental Findings Committee. [HOSPITAL]
[9P];[DATE].
5. Chronic bilateral hydronephrosis and renal atrophy with some
calyceal diverticula.
6. Dextroconvex lumbar scoliosis.
7. Pectus excavatum.

## 2019-09-04 MED ORDER — SODIUM CHLORIDE 0.9 % IV SOLN: INTRAVENOUS | Status: DC

## 2019-09-04 MED ORDER — POTASSIUM CHLORIDE CRYS ER 20 MEQ PO TBCR
20.0000 meq | EXTENDED_RELEASE_TABLET | Freq: Once | ORAL | Status: AC
  Administered 2019-09-04: 20 meq via ORAL
  Filled 2019-09-04: qty 1

## 2019-09-04 NOTE — Progress Notes (Signed)
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 Patient was evaluated by her PCP after seeing urine changes, she was referred to the ED for further evaluation.  She denies abdominal pain. Evaluation showed lipase of 264, ALT 209, AST 169, total bilirubin 16 creatinine 2.1 prior creatinine 1.9 2018. Right upper quadrant ultrasound showed distended gallbladder with gallbladder sludge and multiple gallstone.  Common bile duct dilated to 14 mm with intrahepatic and extra hepatic dilation.  Chronic hydronephrosis bilaterally from 2015. GI is recommending MRCP and depending on resort ERCP's.  Surgery has been consulted as well.   Assessment & Plan:   Principal Problem:   Choledocholithiasis Active Problems:   Hypothyroidism   Cholelithiases   Chronic kidney disease (CKD) stage G4/A1, severely decreased glomerular filtration rate (GFR) between 15-29 mL/min/1.73 square meter and albuminuria creatinine ratio less than 30 mg/g (HCC)   History of seizure   Jaundice   Hyperbilirubinemia  1-Obstructive jaundice; Patient presented with transaminases, increased lipase.  Right upper quadrant ultrasound showed gallstones on an dilation of common bile duct. Plan to proceed with MRCP to rule out orders etiology for obstructive jaundice. Depending on MRCP results plan is to proceed with the ERCP.  2-questionable UTI with urinary diversion requiring self-catheterization due to history of bladder cancer: Continue with IV ceftriaxone. Follow urine culture.  3-hypokalemia: replete orally.   History of seizure: Continue with Keppra  Chronic kidney  disease a stage IV with chronic hydronephrosis: No recent baseline to compare, but creatinine 2018 was at 1.8(careeverywhere) Monitor renal function Cr down to 1.9--from 2.  Hypothyroidism: Continue with Synthroid  Nutrition Problem: Increased nutrient needs Etiology: acute illness, chronic illness    Signs/Symptoms: estimated needs    Interventions: Refer to RD note for recommendations  Estimated body mass index is 15.74 kg/m as calculated from the following:   Height as of this encounter: 5' 6.5" (1.689 m).   Weight as of this encounter: 44.9 kg.   DVT prophylaxis: SCD Code Status: Full code Family Communication: Care discussed with patient directly Disposition Plan:  Patient is from: Home Anticipated d/c date: To be determine discharge To 3 days after work-up completed Barriers to d/c or necessity for inpatient status: Patient reports being in the hospital for further evaluation of obstructive jaundice.  She will need MRCP and depending on results may need ERCP  Consultants:   GI  Surgery   Procedures:   none  Antimicrobials:    Subjective: Patient denies abdominal pain.  She understand plan is to proceed with MRCP   Objective: Vitals:   09/04/19 0456 09/04/19 0500 09/04/19 0954 09/04/19 1209  BP: (!) 114/51  117/64 (!) 122/59  Pulse: 61  62 66  Resp:  19 18 18   Temp: 98.5 F (36.9 C)  98.4 F (36.9 C) 98.4 F (36.9 C)  TempSrc: Oral  Oral Oral  SpO2: 97%  96% 97%  Weight:      Height:        Intake/Output Summary (Last 24 hours) at 09/04/2019 1652 Last data filed at 09/04/2019 4332 Gross per 24 hour  Intake 333.11 ml  Output 500 ml  Net -166.89 ml   Filed Weights   09/03/19 1138 09/03/19  2111  Weight: 45.7 kg 44.9 kg    Examination:  General exam: Appears calm and comfortable , icteric.  Respiratory system: Clear to auscultation. Respiratory effort normal. Cardiovascular system: S1 & S2 heard, RRR. No JVD, murmurs, rubs, gallops or  clicks. No pedal edema. Gastrointestinal system: Abdomen is nondistended, soft and nontender. No organomegaly or masses felt. Normal bowel sounds heard. Central nervous system: Alert and oriented.  Extremities: Symmetric 5 x 5 power. Skin: No rashes, lesions or ulcers   Data Reviewed: I have personally reviewed following labs and imaging studies  CBC: Recent Labs  Lab 09/03/19 1130 09/04/19 0348  WBC 5.0 5.6  NEUTROABS 4.2  --   HGB 9.5* 8.9*  HCT 28.4* 27.1*  MCV 95.3 97.1  PLT 153 409*   Basic Metabolic Panel: Recent Labs  Lab 09/03/19 1130 09/04/19 0348  NA 136 138  K 3.1* 3.3*  CL 108 111  CO2 18* 16*  GLUCOSE 147* 93  BUN 29* 27*  CREATININE 2.11* 1.90*  CALCIUM 8.9 8.7*   GFR: Estimated Creatinine Clearance: 14.5 mL/min (A) (by C-G formula based on SCr of 1.9 mg/dL (H)). Liver Function Tests: Recent Labs  Lab 09/03/19 1130 09/04/19 0348 09/04/19 0753  AST 169* 157* 182*  ALT 209* 186* 199*  ALKPHOS 410* 421* 457*  BILITOT 16.1* 15.4* 16.1*  PROT 6.9 6.0* 6.2*  ALBUMIN 2.9* 2.7* 2.7*   Recent Labs  Lab 09/03/19 1130 09/04/19 0753  LIPASE 264* 278*   No results for input(s): AMMONIA in the last 168 hours. Coagulation Profile: Recent Labs  Lab 09/04/19 0753  INR 1.1   Cardiac Enzymes: No results for input(s): CKTOTAL, CKMB, CKMBINDEX, TROPONINI in the last 168 hours. BNP (last 3 results) No results for input(s): PROBNP in the last 8760 hours. HbA1C: No results for input(s): HGBA1C in the last 72 hours. CBG: No results for input(s): GLUCAP in the last 168 hours. Lipid Profile: No results for input(s): CHOL, HDL, LDLCALC, TRIG, CHOLHDL, LDLDIRECT in the last 72 hours. Thyroid Function Tests: No results for input(s): TSH, T4TOTAL, FREET4, T3FREE, THYROIDAB in the last 72 hours. Anemia Panel: No results for input(s): VITAMINB12, FOLATE, FERRITIN, TIBC, IRON, RETICCTPCT in the last 72 hours. Sepsis Labs: No results for input(s): PROCALCITON,  LATICACIDVEN in the last 168 hours.  Recent Results (from the past 240 hour(s))  Respiratory Panel by RT PCR (Flu A&B, Covid) - Nasopharyngeal Swab     Status: None   Collection Time: 09/03/19  1:59 PM   Specimen: Nasopharyngeal Swab  Result Value Ref Range Status   SARS Coronavirus 2 by RT PCR NEGATIVE NEGATIVE Final    Comment: (NOTE) SARS-CoV-2 target nucleic acids are NOT DETECTED. The SARS-CoV-2 RNA is generally detectable in upper respiratoy specimens during the acute phase of infection. The lowest concentration of SARS-CoV-2 viral copies this assay can detect is 131 copies/mL. A negative result does not preclude SARS-Cov-2 infection and should not be used as the sole basis for treatment or other patient management decisions. A negative result may occur with  improper specimen collection/handling, submission of specimen other than nasopharyngeal swab, presence of viral mutation(s) within the areas targeted by this assay, and inadequate number of viral copies (<131 copies/mL). A negative result must be combined with clinical observations, patient history, and epidemiological information. The expected result is Negative. Fact Sheet for Patients:  PinkCheek.be Fact Sheet for Healthcare Providers:  GravelBags.it This test is not yet ap proved or cleared by the [place] and  has been authorized for detection and/or diagnosis of SARS-CoV-2 by FDA under an Emergency Use Authorization (EUA). This EUA will remain  in effect (meaning this test can be used) for the duration of the COVID-19 declaration under Section 564(b)(1) of the Act, 21 U.S.C. section 360bbb-3(b)(1), unless the authorization is terminated or revoked sooner.    Influenza A by PCR NEGATIVE NEGATIVE Final   Influenza B by PCR NEGATIVE NEGATIVE Final    Comment: (NOTE) The Xpert Xpress SARS-CoV-2/FLU/RSV assay is intended as an aid in  the diagnosis of  influenza from Nasopharyngeal swab specimens and  should not be used as a sole basis for treatment. Nasal washings and  aspirates are unacceptable for Xpert Xpress SARS-CoV-2/FLU/RSV  testing. Fact Sheet for Patients: PinkCheek.be Fact Sheet for Healthcare Providers: GravelBags.it This test is not yet approved or cleared by the [place] FDA and  has been authorized for detection and/or diagnosis of SARS-CoV-2 by  FDA under an Emergency Use Authorization (EUA). This EUA will remain  in effect (meaning this test can be used) for the duration of the  Covid-19 declaration under Section 564(b)(1) of the Act, 21  U.S.C. section 360bbb-3(b)(1), unless the authorization is  terminated or revoked. Performed at [facility], [address] [place], [place] [address]          Radiology Studies: DG Chest 2 View  Result Date: 09/03/2019 CLINICAL DATA:  Generalized weakness EXAM: CHEST - 2 VIEW COMPARISON:  March 06, 2012 FINDINGS: There is marked pectus excavatum. There is slight scarring in the right mid lung. There is no edema or airspace opacity. No focal nodular lesions evident. The heart size and pulmonary vascularity within normal limits. No adenopathy. IMPRESSION: Mild scarring right mid lung. Lungs elsewhere clear. Cardiac silhouette within normal limits. No adenopathy appreciable. There is marked pectus excavatum. Electronically Signed   By: Lowella Grip III M.D.   On: 09/03/2019 12:12   US Abdomen Complete  Result Date: 09/03/2019 CLINICAL DATA:  Right upper quadrant pain EXAM: ABDOMEN ULTRASOUND COMPLETE COMPARISON:  By report from 02/05/2014. FINDINGS: Gallbladder: Gallbladder is well distended with evidence of gallbladder sludge and multiple gallstones. No wall thickening or pericholecystic fluid is noted. Common bile duct: Diameter: 14.9 mm with intrahepatic dilatation Liver: No focal lesion identified.  Within normal limits in parenchymal echogenicity. Portal vein is patent on color Doppler imaging with normal direction of blood flow towards the liver. Significant intrahepatic ductal dilatation is noted. IVC: No abnormality visualized. Pancreas: Visualized portion unremarkable. Spleen: Size and appearance within normal limits. Right Kidney: Length: 7.9 cm. Chronic hydronephrosis is noted stable by report from a prior CT of 2015. Left Kidney: Length: 9.4 cm. Chronic hydronephrosis is noted stable from a prior CT examination. Abdominal aorta: No aneurysm visualized. Other findings: None. IMPRESSION: Cholelithiasis and gallbladder sludge with significant intra and extrahepatic biliary ductal dilatation. Correlation with laboratory values is recommended. These changes are likely related to a distal common bile duct stone. Further workup is recommended. Chronic hydronephrosis bilaterally stable from a prior CT of 2015 consistent with the patient's known urinary diversion. Electronically Signed   By: Inez Catalina M.D.   On: 09/03/2019 12:37   MR ABDOMEN MRCP WO CONTRAST  Result Date: 09/04/2019 CLINICAL DATA:  Jaundice. Gallstones. Biliary obstruction. Bladder cancer. Kidney disease. Chronic hydronephrosis. EXAM: MRI ABDOMEN WITHOUT CONTRAST  (INCLUDING MRCP) TECHNIQUE: Multiplanar multisequence MR imaging of the abdomen was performed. Heavily T2-weighted images of the biliary and pancreatic ducts were obtained, and  three-dimensional MRCP images were rendered by post processing. COMPARISON:  Abdominal ultrasound 09/03/2019 FINDINGS: Despite efforts by the technologist and patient, motion artifact is present on today's exam and could not be eliminated. This reduces exam sensitivity and specificity. Lower chest: Pectus excavatum. Hepatobiliary: Distended gallbladder with mild gallbladder wall thickening and numerous gallstones measuring up to about 0.7 cm in diameter. Prominent intrahepatic biliary dilatation. The  common hepatic duct measures 1.8 cm in diameter and the common bile duct measures 1.4 cm in diameter. The dilated CBD extends down to what appears to be a cluster of at least 2 small stones in the distal CBD for example as shown on image 29/15 and likewise suggested on image 46/13. Image 24/3 likewise demonstrates 2-3 small low T2 signal intensities at the obstruction point favoring stones. These measure up to about 0.3 cm individually. Pancreas: There is dilation of the dorsal pancreatic duct which is new compared to 11/08/2012. Clustered cystic lesions along the pancreatic tail, the largest 1.9 by 1.6 by 1.9 cm on image 13/3, previously about 1.2 by 1.0 by 1.1 cm on 11/08/2012. The other clustered cystic lesions along the tip of the pancreatic tail. Spleen:  Unremarkable Adrenals/Urinary Tract: Bilateral hydronephrosis and renal atrophy with some calyceal diverticula. This is a chronic appearance Stomach/Bowel: Unremarkable Vascular/Lymphatic:  Grossly unremarkable Other:  No supplemental non-categorized findings. Musculoskeletal: Dextroconvex lumbar scoliosis. IMPRESSION: 1. Choledocholithiasis with a cluster of small stones in the distal CBD measuring individually up to about 0.3 cm in diameter. The dilated CBD measures 1.4 cm in diameter. 2. Distended gallbladder with numerous gallstones measuring up to about 0.7 cm in diameter, and mild gallbladder wall thickening and pericholecystic fluid. 3. There is also dilation of the dorsal pancreatic duct which is new compared to 11/08/2012. 4. Several clustered cystic lesions along the tip of the pancreatic tail, the largest measuring 1.9 by 1.6 by 1.9 cm, previously 1.2 by 1.0 by 1.1 cm on 11/08/2012. Possibilities include postinflammatory cystic lesions or intraductal mucinous neoplasm. Given the size, patient age, and mild degree of progression over the intervening years, surveillance imaging by pancreatic protocol MRI in 2 years time is recommended. This  recommendation follows ACR consensus guidelines: Management of Incidental Pancreatic Cysts: A White Paper of the ACR Incidental Findings Committee. J Am Coll Radiol 6644;03:474-259. 5. Chronic bilateral hydronephrosis and renal atrophy with some calyceal diverticula. 6. Dextroconvex lumbar scoliosis. 7. Pectus excavatum. Electronically Signed   By: Van Clines M.D.   On: 09/04/2019 16:01   MR 3D Recon At Scanner  Result Date: 09/04/2019 CLINICAL DATA:  Jaundice. Gallstones. Biliary obstruction. Bladder cancer. Kidney disease. Chronic hydronephrosis. EXAM: MRI ABDOMEN WITHOUT CONTRAST  (INCLUDING MRCP) TECHNIQUE: Multiplanar multisequence MR imaging of the abdomen was performed. Heavily T2-weighted images of the biliary and pancreatic ducts were obtained, and three-dimensional MRCP images were rendered by post processing. COMPARISON:  Abdominal ultrasound 09/03/2019 FINDINGS: Despite efforts by the technologist and patient, motion artifact is present on today's exam and could not be eliminated. This reduces exam sensitivity and specificity. Lower chest: Pectus excavatum. Hepatobiliary: Distended gallbladder with mild gallbladder wall thickening and numerous gallstones measuring up to about 0.7 cm in diameter. Prominent intrahepatic biliary dilatation. The common hepatic duct measures 1.8 cm in diameter and the common bile duct measures 1.4 cm in diameter. The dilated CBD extends down to what appears to be a cluster of at least 2 small stones in the distal CBD for example as shown on image 29/15 and likewise suggested on image 46/13.  Image 24/3 likewise demonstrates 2-3 small low T2 signal intensities at the obstruction point favoring stones. These measure up to about 0.3 cm individually. Pancreas: There is dilation of the dorsal pancreatic duct which is new compared to 11/08/2012. Clustered cystic lesions along the pancreatic tail, the largest 1.9 by 1.6 by 1.9 cm on image 13/3, previously about 1.2 by 1.0  by 1.1 cm on 11/08/2012. The other clustered cystic lesions along the tip of the pancreatic tail. Spleen:  Unremarkable Adrenals/Urinary Tract: Bilateral hydronephrosis and renal atrophy with some calyceal diverticula. This is a chronic appearance Stomach/Bowel: Unremarkable Vascular/Lymphatic:  Grossly unremarkable Other:  No supplemental non-categorized findings. Musculoskeletal: Dextroconvex lumbar scoliosis. IMPRESSION: 1. Choledocholithiasis with a cluster of small stones in the distal CBD measuring individually up to about 0.3 cm in diameter. The dilated CBD measures 1.4 cm in diameter. 2. Distended gallbladder with numerous gallstones measuring up to about 0.7 cm in diameter, and mild gallbladder wall thickening and pericholecystic fluid. 3. There is also dilation of the dorsal pancreatic duct which is new compared to 11/08/2012. 4. Several clustered cystic lesions along the tip of the pancreatic tail, the largest measuring 1.9 by 1.6 by 1.9 cm, previously 1.2 by 1.0 by 1.1 cm on 11/08/2012. Possibilities include postinflammatory cystic lesions or intraductal mucinous neoplasm. Given the size, patient age, and mild degree of progression over the intervening years, surveillance imaging by pancreatic protocol MRI in 2 years time is recommended. This recommendation follows ACR consensus guidelines: Management of Incidental Pancreatic Cysts: A White Paper of the ACR Incidental Findings Committee. J Am Coll Radiol 8756;43:329-518. 5. Chronic bilateral hydronephrosis and renal atrophy with some calyceal diverticula. 6. Dextroconvex lumbar scoliosis. 7. Pectus excavatum. Electronically Signed   By: Van Clines M.D.   On: 09/04/2019 16:01        Scheduled Meds: . feeding supplement  1 Container Oral TID BM  . levETIRAcetam  500 mg Oral BID  . levothyroxine  100 mcg Oral Q0600  . melatonin  5 mg Oral QHS  . metoprolol tartrate  12.5 mg Oral BID  . sodium bicarbonate  1,300 mg Oral BID    Continuous Infusions: . sodium chloride 100 mL/hr at 09/04/19 1614  . cefTRIAXone (ROCEPHIN)  IV 1 g (09/04/19 1310)     LOS: 1 day    Time spent: 35 minutes,     Beautifull Cisar A Daylen Lipsky, MD [facility]   If 7PM-7AM, please contact night-coverage [email or web address]  09/04/2019, 4:52 PM

## 2019-09-04 NOTE — Progress Notes (Signed)
Initial Nutrition Assessment  DOCUMENTATION CODES:   Underweight  INTERVENTION:  - diet advancement as medically feasible. - continue order for Boost Breeze TID, each supplement provides 250 kcal and 9 grams of protein.   NUTRITION DIAGNOSIS:   Increased nutrient needs related to acute illness, chronic illness as evidenced by estimated needs.  GOAL:   Patient will meet greater than or equal to 90% of their needs  MONITOR:   Diet advancement, Labs, Weight trends  REASON FOR ASSESSMENT:   Malnutrition Screening Tool  ASSESSMENT:   86 y.o. female with medical history significant for seizures, stage 4 CKD, chronic hydronephrosis d/t urinary diversion, hypothyroidism, and bladder cancer with urinary diversion and self-catheterization through R lower abdominal stoma. She presented to the ED due to concern about change in urine color (orange). She also reported increase in frequency of BMs, but no diarrhea. She denied abdominal pain, N/V. She denies any changes in appetite.  Patient has been NPO since admission. She is currently out of the room to MRI; unable to talk with patient/obtain information from patient.   Per chart review, weight yesterday was recorded as both 99 lb and 101 lb. PTA, the most recently documented weight was on 05/18/19 when she weighed 130 lb. Question accuracy of this. Prior to that, the most recently recorded weight was at [facility] on 06/27/17 when she weighed 118 lb.   Suspect some degree of malnutrition given BMI of 15.7.  Per notes: - gallstone with CBD dilation, obstructive jaundice, and acute cholestasis - not feeling well x1 month PTA - plan for ERCP on 3/26 - acute on chronic anemia   Labs reviewed; K: 3.3 mmol/l, BUN: 27 mg/dl, creatinine: 1.9 mg/dl, Ca: 8.7 mg/dl, Alk Phos elevated and trending up x2 days, LFTs elevated and trending up x1 day, GFR: 23 ml/min. Medications reviewed; 100 mcg oral synthroid/day, 5 mg melatonin/day, 10 mEq IV KCl x1 run  3/24, 1300 mg oral sodium bicarb BID. IVF; NS @ 100 ml/hr.    NUTRITION - FOCUSED PHYSICAL EXAM:  unable to complete at this time.   Diet Order:   Diet Order            Diet NPO time specified Except for: Sips with Meds  Diet effective midnight              EDUCATION NEEDS:   No education needs have been identified at this time  Skin:  Skin Assessment: Reviewed RN Assessment  Last BM:  3/24  Height:   Ht Readings from Last 1 Encounters:  09/03/19 5' 6.5" (1.689 m)    Weight:   Wt Readings from Last 1 Encounters:  09/03/19 44.9 kg    Estimated Nutritional Needs:  Kcal:  3414-4360 kcal Protein:  85-95 grams Fluid:  >/= 2 L/day    Jarome Matin, MS, RD, LDN, CNSC Inpatient Clinical Dietitian RD pager # available in [facility]  After hours/weekend pager # available in [facility]

## 2019-09-04 NOTE — H&P (View-Only) (Signed)
Referring Provider: Dr. Eric [place]  Primary Care Physician: Dr. Antony Salmon at [facility]  Primary Gastroenterologist:  Althia Forts   Reason for Consultation:  Gallstones, dilated common bile duct, obstructive jaundice with acute cholestasis   HPI: Glenda King is a 86 y.o. female with a past medical history of hypothyroidism, CKD stage 4 with chronic hydronephrosis, bladder cancer s/p radical cystectomy with [place] pouch in 2003 requiring self catheterization, seizures on Keppra, left parasagittal extra-axial meningioma followed by surveillance MRI by neurology at [facility] and a 1.1 x 1.5 cm cystic lesion tail of the pancreas per MRI in 2009 - 2014 without further surveillance MRIs since then. She was at church on Monday 09/01/2019 and her friend noted the patient appeared jaundiced. She contacted her PCP Dr. Antony Salmon and she was sent directly to [facility] ED. Her labs in the ED showed K+ 3.1. Cr. 2.11. Alk phos 410. AST 169. ALT 209. T. Bili 16.1. Direct bili 10. Lipase 264. WBC 5.0. Hg 9.5. Acute hepatitis panel negative.  An abdominal sonogram showed a dilated CBD 14.50m with intrahepatic dilation, distended gallbladder with evidence of gallbladder sludge and multiple gallstones without wall thickening or pericholecystic fluid. [facility] to include MRCP and most likely ERCP.   She reported not feeling well for the past month. She initially noticed a change in her bowel pattern. She previously passed a normal brown formed stool daily. Approximately, 4 weeks ago she started passing small thin narrow stools 3 to 4 hours after eating any food. No rectal bleeding or melena. No yellow or white stools. She developed generalized pruritis 4 or 5 months ago. She also noted her urine was also orange in color. She was seen by her PCP around 08/19/2019 and she was given an antibiotic for a UTI. She self catheterizes her pouch stoma  5 to  6 times daily. No other new medications over the past 6 months. No alcohol use. No N/V or upper or lower abdominal pain. She has lost 5lbs over the past 4 weeks. No fever, sweats or chills. She underwent 1 colonoscopy in her lifetime which she stated was [facility] approximately [facility]32[facility]. No family history of liver, pancreatic or colon cancer. She has a history of a pancreatic tail cyst, last MRI in care everywhere was in 2014 which showed a 1.1 x 1.5 cm cystic lesion tail of the pancreas. She did not wish to pursue further surveillance MRIs or evaluation. [facility] She has one brother who lives nearby.   Laboratory studies 09/04/2019:  Na 138. K 3.3. Glu 93. BUN 27. Cr. 1.90. Anion gap 11. Alk phos 421. AST 157 ALT 186. T. Bili 15.4.   SARS Corona virus 2 negative 3/24. Influenza A and B negative.   Abdominal sonogram 09/03/2019: Cholelithiasis and gallbladder sludge with significant intra and extrahepatic biliary ductal dilatation. Correlation with laboratory values is recommended. These changes are likely related to a distal common bile duct stone.  Chronic hydronephrosis bilaterally stable from a prior CT of 2015 consistent with the patient's known urinary diversion    Past Medical History:  Diagnosis Date   Anemia    Cancer ([place]    bladder cancer   Cardiomyopathy ([place]    CKD (chronic kidney disease)    Thyroid disease    hypothyroid    History reviewed. No pertinent surgical history.  Prior to Admission medications   Medication Sig Start Date End Date Taking? Authorizing Provider  aspirin EC 81 MG tablet Take 81 mg by mouth every evening.  12/06/11  Yes [provider]  co-enzyme Q-10 30 MG capsule Take 30 mg by mouth every evening.   Yes [provider]  D2000 ULTRA STRENGTH 50 MCG (2000 UT) CAPS Take 1 capsule by mouth in the morning and at bedtime.  03/18/19  Yes  [provider]  levETIRAcetam (KEPPRA) 500 MG tablet Take 500 mg by mouth 2 (two) times daily. 05/13/19  Yes [provider]  levothyroxine (SYNTHROID) 100 MCG tablet Take 100 mcg by mouth daily. 05/13/19  Yes [provider]  melatonin 5 MG TABS Take 5 mg by mouth at bedtime.    Yes [provider]  metoprolol tartrate (LOPRESSOR) 25 MG tablet Take 12.5 mg by mouth 2 (two) times daily.  02/23/14  Yes [provider]  sodium bicarbonate 650 MG tablet Take 1,300 mg by mouth 2 (two) times daily. 03/12/19  Yes [provider]    Current Facility-Administered Medications  Medication Dose Route Frequency Provider Last Rate Last Admin   cefTRIAXone (ROCEPHIN) 1 g in sodium chloride 0.9 % 100 mL IVPB  1 g Intravenous Q24H Tu, Ching T, DO       feeding supplement (BOOST / RESOURCE BREEZE) liquid 1 Container  1 Container Oral TID BM Tu, Ching T, DO       levETIRAcetam (KEPPRA) tablet 500 mg  500 mg Oral BID Tu, Ching T, DO   500 mg at 09/03/19 2315   levothyroxine (SYNTHROID) tablet 100 mcg  100 mcg Oral Q0600 Tu, Ching T, DO       melatonin tablet 5 mg  5 mg Oral QHS Tu, Ching T, DO   5 mg at 09/03/19 2315   metoprolol tartrate (LOPRESSOR) tablet 12.5 mg  12.5 mg Oral BID Tu, Ching T, DO   12.5 mg at 09/03/19 2316   sodium bicarbonate tablet 1,300 mg  1,300 mg Oral BID Tu, Ching T, DO   1,300 mg at 09/03/19 2315    Allergies as of 09/03/2019 - Review Complete 09/03/2019  Allergen Reaction Noted   Amoxicillin     Gadolinium derivatives Other (See Comments) 04/01/2014   Pollen extract Other (See Comments) 11/13/2012    History reviewed. No pertinent family history.  Social History   Socioeconomic History   Marital status: Married    Spouse name: Not on file   Number of children: Not on file   Years of education: Not on file   Highest education level: Not on file  Occupational History   Not on file  Tobacco Use   Smoking  status: Never Smoker   Smokeless tobacco: Never Used  Substance and Sexual Activity   Alcohol use: Not Currently   Drug use: Not Currently   Sexual activity: Not on file  Other Topics Concern   Not on file  Social History Narrative   Not on file   Social Determinants of Health   Financial Resource Strain:    Difficulty of Paying Living Expenses:   Food Insecurity:    Worried About [place] in the Last Year:    Arboriculturist in the Last Year:   Transportation Needs:    Film/video editor (Medical):    Lack of Transportation (Non-Medical):   Physical Activity:    Days of Exercise per Week:    Minutes  of Exercise per Session:   Stress:    Feeling of Stress :   Social Connections:    Frequency of Communication with Friends and Family:    Frequency of Social Gatherings with Friends and Family:    Attends Religious Services:    Active Member of Clubs or Organizations:    Attends Music therapist:    Marital Status:   Intimate Partner Violence:    Fear of Current or Ex-Partner:    Emotionally Abused:    Physically Abused:    Sexually Abused:     Review of Systems: Gen: + 5lb weight loss. Denies fever, sweats or chills.  CV: Denies chest pain, palpitations or edema. Resp: Denies cough, shortness of breath of hemoptysis.  GI: Denies heartburn, dysphagia, stomach or lower abdominal pain. No diarrhea or constipation. No rectal bleeding or melena.   GU : Self catheterizes pouch, urine darker orange. MS: Denies joint pain, muscles aches or weakness. Derm: Denies rash, itchiness, skin lesions or unhealing ulcers. Psych: Denies depression, anxiety, memory loss or confusion.  Heme: Denies easy bruising, bleeding. Neuro:  Denies headaches, dizziness or paresthesias. Endo:  Denies any problems with DM, thyroid or adrenal function.  Physical Exam: Vital signs in last 24 hours: Temp:  [97.8 F (36.6 C)-98.5 F (36.9 C)] 98.5  F (36.9 C) (03/25 0456) Pulse Rate:  [54-77] 61 (03/25 0456) Resp:  [16-25] 18 (03/25 0230) BP: (113-149)/(51-92) 114/51 (03/25 0456) SpO2:  [95 %-100 %] 97 % (03/25 0456) Weight:  [44.9 kg-45.7 kg] 44.9 kg (03/24 2111) Last BM Date: 09/03/19 General:  Thin 86 year old female, alert in NAD.  Head:  Normocephalic and atraumatic. Eyes:  Scleral icterus present.  Ears:  Normal auditory acuity. Nose:  No deformity, discharge or lesions. Mouth: Dentition intact. No ulcers or lesions.  Neck:  Supple. No lymphadenopathy or thyromegaly.  Lungs:  Few scattered expiratory wheezes otherwise clear throughout.  Heart:  RRR, 1/6 systolic murmur.  Abdomen:  Flat, soft, nontender. RLQ urinary pouch stoma intact. Aorta diameter > 2cm and palpable, + LUQ bruit (I spoke to hospitalist Dr. Tyrell Antonio regarding abdominal exam).  Rectal:  Deferred. Msk:  Symmetrical without gross deformities.  Pulses:  Normal pulses noted. Extremities:  Without clubbing or edema. Neurologic:  Alert and  oriented x4. No focal deficits.  Skin:  Jaundice. Psych:  Alert and cooperative. Normal mood and affect.  Intake/Output from previous day: 03/24 0701 - 03/25 0700 In: 318.1 [P.O.:240; IV Piggyback:78.1] Out: 500 [Urine:500] Intake/Output this shift: Total I/O In: 318.1 [P.O.:240; IV Piggyback:78.1] Out: 500 [Urine:500]  Lab Results: Recent Labs    09/03/19 1130 09/04/19 0348  WBC 5.0 5.6  HGB 9.5* 8.9*  HCT 28.4* 27.1*  PLT 153 136*   BMET Recent Labs    09/03/19 1130  NA 136  K 3.1*  CL 108  CO2 18*  GLUCOSE 147*  BUN 29*  CREATININE 2.11*  CALCIUM 8.9   LFT Recent Labs    09/03/19 1130  PROT 6.9  ALBUMIN 2.9*  AST 169*  ALT 209*  ALKPHOS 410*  BILITOT 16.1*  BILIDIR 10.0*  IBILI 6.1*   PT/INR No results for input(s): LABPROT, INR in the last 72 hours. Hepatitis Panel Recent Labs    09/03/19 1130  HEPBSAG NON REACTIVE  HCVAB NON REACTIVE  HEPAIGM NON REACTIVE  HEPBIGM NON  REACTIVE      Studies/Results: DG Chest 2 View  Result Date: 09/03/2019 CLINICAL DATA:  Generalized weakness EXAM: CHEST -  2 VIEW COMPARISON:  March 06, 2012 FINDINGS: There is marked pectus excavatum. There is slight scarring in the right mid lung. There is no edema or airspace opacity. No focal nodular lesions evident. The heart size and pulmonary vascularity within normal limits. No adenopathy. IMPRESSION: Mild scarring right mid lung. Lungs elsewhere clear. Cardiac silhouette within normal limits. No adenopathy appreciable. There is marked pectus excavatum. Electronically Signed   By: Lowella Grip III M.D.   On: 09/03/2019 12:12   US Abdomen Complete  Result Date: 09/03/2019 CLINICAL DATA:  Right upper quadrant pain EXAM: ABDOMEN ULTRASOUND COMPLETE COMPARISON:  By report from 02/05/2014. FINDINGS: Gallbladder: Gallbladder is well distended with evidence of gallbladder sludge and multiple gallstones. No wall thickening or pericholecystic fluid is noted. Common bile duct: Diameter: 14.9 mm with intrahepatic dilatation Liver: No focal lesion identified. Within normal limits in parenchymal echogenicity. Portal vein is patent on color Doppler imaging with normal direction of blood flow towards the liver. Significant intrahepatic ductal dilatation is noted. IVC: No abnormality visualized. Pancreas: Visualized portion unremarkable. Spleen: Size and appearance within normal limits. Right Kidney: Length: 7.9 cm. Chronic hydronephrosis is noted stable by report from a prior CT of 2015. Left Kidney: Length: 9.4 cm. Chronic hydronephrosis is noted stable from a prior CT examination. Abdominal aorta: No aneurysm visualized. Other findings: None. IMPRESSION: Cholelithiasis and gallbladder sludge with significant intra and extrahepatic biliary ductal dilatation. Correlation with laboratory values is recommended. These changes are likely related to a distal common bile duct stone. Further workup is  recommended. Chronic hydronephrosis bilaterally stable from a prior CT of 2015 consistent with the patient's known urinary diversion. Electronically Signed   By: Inez Catalina M.D.   On: 09/03/2019 12:37    IMPRESSION/PLAN:  60. 86 year old female with painless jaundice presented to [facility] ED on 3/24. Laboratory studies showed acute cholestasis. Abdominal sonogram showed gallstones with a dilated CBD 14.69m, multiple gallstones with intra and extra hepatic dilatation. Labs and sono concerning for choledocholithiasis verse biliary malignancy.  Alk pho 410 -> 421. AST 169 -> 157. ALT 209 -> 186. T. Bili 16.1 -> 15.4. Lipase 264.  WBC 5.6. She is afebrile. Hemodynamically stable.   -Hepatic panel, Lipase, PT/INR -NPO -Stat Abd MRI without contrast/MRCP today -ERCP with Dr. PHenrene PastorFriday 09/05/2019. ERCP benefits and risks discussed with the patient including risk with general anesthesia, risk of bleeding, perforation,  infection and pancreatitis -She is at risk for cholangitis, she in on Rocephin for UTI. If she develops fever or leukocytosis she will require Zosyn  -Zofran '4mg'$  IV PRN nausea -IVF NS @ 100cc/hr  2. Acute on chronic anemia, dilutional component.  Hg 10.1- > 8.9. HCT 27.1. MCV 97.1. No signs of active GI bleeding -Repeat CBC in am  3. Thrombocytopenia, acute. PLT 153 - 136.   4. CKD stage 4. ( Baseline Cr. 1.97)  5. History of bladder cancer 2003, self catheterizes [place] pouch.  6. UTI. On Rocephin.   7. Hypokalemia. K+ 3.3 -Management per the hospitalist   8. LUQ abdominal bruit on exam, aorta pulsatile. Abdominal sono without evidence of an aortic aneurysm. (Abdominal exam discussed with Dr. RTyrell Antonio.  Further recommendations per Dr. PVerta EllenMDorathy Daft 09/04/2019, 5:16 AM  GI ATTENDING  History, laboratories, x-rays reviewed.  Patient seen and examined.  Agree with comprehensive consultation note as outlined above.  Elderly female who  presents with painless jaundice.  Differential diagnosis is choledocholithiasis (given gallbladder stones) or malignancy  such as pancreatic cancer.  I am concerned about the latter given the painless nature of the presenting jaundice, pruritus, and weight loss.  MRCP would be the next step.  I do not believe that she will need surgery if she has simple choledocholithiasis and we are able to clear the duct, given her age.  As well, she would not be a surgical candidate given her age, should she have pancreatic cancer.  Patient scheduled for ERCP tomorrow at 1 PM.  Await MRCP results.  She is high risk given her age and comorbidities.The nature of the procedure, as well as the risks, benefits, and alternatives were carefully and thoroughly reviewed with the patient. Ample time for discussion and questions allowed. The patient understood, was satisfied, and agreed to proceed.  Docia Chuck. Geri Seminole., M.D. [facility] Division of Gastroenterology

## 2019-09-04 NOTE — Consult Note (Addendum)
[facility] Surgery Consult Note  Glenda King 1928/07/04  [id number].    Requesting MD:  Rudolpho Sevin Chief Complaint: Jaundice, weakness/tired, and increased bowel movements Reason for Consult: Acute cholecystitis, cholelithiasis, choledocholithiasis  HPI: Patient is an 86 year old female with a history of bladder cancer, urinary diversion and self-catheterization through a right lower abdominal stoma presents with a change in her urine color.  She is also had increase in the number of her bowel movements but no diarrhea.  She has not had any pain, nausea or vomiting with this.  She presented to [facility] yesterday afternoon for evaluation.  Work-up in the ED at [facility] shows she is afebrile and vital signs are stable.  CMP shows a potassium 3.1, glucose 147, CO2 18, BUN 29, creatinine  2.11, alk phos 410, AST 169, ALT 209, direct bilirubin 10, indirect 6.1, total bilirubin 16.1.  WBC 5.0, hemoglobin 9.5, hematocrit 28.4, platelets 153,000.  Covid negative, acute hepatitis panel negative.  Urinalysis positive for leukocytes, bacteria, greater than 50 WBC per high-powered field.  Chest x-ray shows mild scarring right lung pectus excavatum otherwise clear.  Abdominal ultrasound shows the gallbladder is well distended with evidence of gallbladder sludge and multiple gallstones no wall thickening or pericholecystic fluid is noted.  Common bile duct is 14.9 mm with intrahepatic dilatation.  Liver was within normal limits with normal directional blood flow, significant intrahepatic dilatation.  Pancreas was unremarkable.  Chronic hydronephrosis.  She was admitted by medicine with acute Coley toco lithiasis/cholelithiasis.  Questionable UTI, hypokalemia, Hx seizures chronic kidney disease stage IV with chronic hydronephrosis, and hypothyroidism.  She is awaiting evaluation by [facility], with MRCP pending.  We are asked to see.  Repeat LFTs this a.m. shows total bilirubin  15.4, ALT 186, AST 157, creatinine 1.9, WBC 5.6, H/H 8.9/27, WBC 136K.  We are asked to see. *  ROS: Review of Systems  Constitutional: Negative.   HENT: Negative.   Eyes:       Mild jaundice  Respiratory: Negative.   Cardiovascular: Negative.   Gastrointestinal: Negative.   Genitourinary: Negative.        Urine changing color, much darker.  Musculoskeletal: Negative.   Skin: Negative.        She did not realize she was turning jaundiced  Neurological: Negative.   Endo/Heme/Allergies: Negative for environmental allergies and polydipsia. Bruises/bleeds easily.  Psychiatric/Behavioral: Negative.     History reviewed. No pertinent family history.  Past Medical History:  Diagnosis Date  . Anemia   . Cancer [facility])    bladder cancer  . Cardiomyopathy ([facility])   . CKD (chronic kidney disease)   . Thyroid disease    hypothyroid    History reviewed. No pertinent surgical history.  Social History:  reports that she has never smoked. She has never used smokeless tobacco. She reports previous alcohol use. She reports previous drug use.  Allergies:  Allergies  Allergen Reactions  . Amoxicillin     Pt unsure of reaction     . Gadolinium Derivatives Other (See Comments)    Secondary to her stage IV kidney disease Secondary to her stage IV kidney disease   . Pollen Extract Other (See Comments)    Sneezing    Medications Prior to Admission  Medication Sig Dispense Refill  . aspirin EC 81 MG tablet Take 81 mg by mouth every evening.     Marland Kitchen co-enzyme Q-10 30 MG capsule Take 30 mg by mouth every evening.    Marland Kitchen  D2000 ULTRA STRENGTH 50 MCG (2000 UT) CAPS Take 1 capsule by mouth in the morning and at bedtime.     . levETIRAcetam (KEPPRA) 500 MG tablet Take 500 mg by mouth 2 (two) times daily.    Marland Kitchen levothyroxine (SYNTHROID) 100 MCG tablet Take 100 mcg by mouth daily.    . melatonin 5 MG TABS Take 5 mg by mouth at bedtime.     . metoprolol tartrate (LOPRESSOR) 25 MG tablet Take 12.5  mg by mouth 2 (two) times daily.     . sodium bicarbonate 650 MG tablet Take 1,300 mg by mouth 2 (two) times daily.      Blood pressure (!) 114/51, pulse 61, temperature 98.5 F (36.9 C), temperature source Oral, resp. rate 19, height 5' 6.5" (1.689 m), weight 44.9 kg, SpO2 97 %. Physical Exam:   General: pleasant, Thin cachectic female who is laying in bed in NAD HEENT: head is normocephalic, atraumatic.  Sclera slightly jaundiced.  Pupils are equal ears and nose without any masses or lesions.  Mouth is pink and moist Heart: regular, rate, and rhythm.  Normal s1,s2. No obvious murmurs, gallops, or rubs noted.  Palpable radial and pedal pulses bilaterally Lungs: CTAB, no wheezes, rhonchi, or rales noted.  Respiratory effort nonlabored Abd: soft, nontender, nondistended.  She has a very small stoma right lower quadrant for her urinary catheterization.  It is pink and healthy in appearance.  He has a 4 x 4 over it.  She has midline surgical incisions that are well-healed. MS: all 4 extremities are symmetrical with no cyanosis, clubbing, or edema. Skin: warm and dry with no masses, lesions, or rashes Neuro: Cranial nerves 2-12 grossly intact, sensation is normal throughout Psych: A&Ox3 with an appropriate affect.   Results for orders placed or performed during the hospital encounter of 09/03/19 (from the past 48 hour(s))  CBC with Differential     Status: Abnormal   Collection Time: 09/03/19 11:30 AM  Result Value Ref Range   WBC 5.0 4.0 - 10.5 K/uL   RBC 2.98 (L) 3.87 - 5.11 MIL/uL   Hemoglobin 9.5 (L) 12.0 - 15.0 g/dL   HCT 28.4 (L) 36.0 - 46.0 %   MCV 95.3 80.0 - 100.0 fL   MCH 31.9 26.0 - 34.0 pg   MCHC 33.5 30.0 - 36.0 g/dL   RDW 16.7 (H) 11.5 - 15.5 %   Platelets 153 150 - 400 K/uL   nRBC 0.0 0.0 - 0.2 %   Neutrophils Relative % 83 %   Neutro Abs 4.2 1.7 - 7.7 K/uL   Lymphocytes Relative 5 %   Lymphs Abs 0.3 (L) 0.7 - 4.0 K/uL   Monocytes Relative 9 %   Monocytes Absolute 0.4  0.1 - 1.0 K/uL   Eosinophils Relative 1 %   Eosinophils Absolute 0.0 0.0 - 0.5 K/uL   Basophils Relative 1 %   Basophils Absolute 0.0 0.0 - 0.1 K/uL   Immature Granulocytes 1 %   Abs Immature Granulocytes 0.03 0.00 - 0.07 K/uL    Comment: Performed at [facility], [address] [place], [place] [address]  Basic metabolic panel     Status: Abnormal   Collection Time: 09/03/19 11:30 AM  Result Value Ref Range   Sodium 136 135 - 145 mmol/L   Potassium 3.1 (L) 3.5 - 5.1 mmol/L   Chloride 108 98 - 111 mmol/L   CO2 18 (L) 22 - 32 mmol/L   Glucose, Bld 147 (H) 70 -  99 mg/dL    Comment: Glucose reference range applies only to samples taken after fasting for at least 8 hours.   BUN 29 (H) 8 - 23 mg/dL   Creatinine, Ser 2.11 (H) 0.44 - 1.00 mg/dL   Calcium 8.9 8.9 - 10.3 mg/dL   GFR calc non Af Amer 20 (L) >60 mL/min   GFR calc Af Amer 24 (L) >60 mL/min   Anion gap 10 5 - 15    Comment: Performed at [facility], [address] [place], [place] [address]  Hepatic function panel     Status: Abnormal   Collection Time: 09/03/19 11:30 AM  Result Value Ref Range   Total Protein 6.9 6.5 - 8.1 g/dL   Albumin 2.9 (L) 3.5 - 5.0 g/dL   AST 169 (H) 15 - 41 U/L   ALT 209 (H) 0 - 44 U/L   Alkaline Phosphatase 410 (H) 38 - 126 U/L   Total Bilirubin 16.1 (H) 0.3 - 1.2 mg/dL   Bilirubin, Direct 10.0 (H) 0.0 - 0.2 mg/dL   Indirect Bilirubin 6.1 (H) 0.3 - 0.9 mg/dL    Comment: Performed at [facility], [address] [place], [place] [address]  Lipase, blood     Status: Abnormal   Collection Time: 09/03/19 11:30 AM  Result Value Ref Range   Lipase 264 (H) 11 - 51 U/L    Comment: Performed at [facility], [address] [place], [place] [address]  Hepatitis panel, acute     Status: None   Collection Time: 09/03/19 11:30 AM  Result Value Ref Range   Hepatitis B Surface Ag NON REACTIVE NON REACTIVE   HCV Ab NON REACTIVE NON REACTIVE    Comment:  (NOTE) Nonreactive HCV antibody screen is consistent with no HCV infections,  unless recent infection is suspected or other evidence exists to indicate HCV infection.    Hep A IgM NON REACTIVE NON REACTIVE   Hep B C IgM NON REACTIVE NON REACTIVE    Comment: Performed at [facility], [place] [address] [place], [place] [address]  Urinalysis, Routine w reflex microscopic     Status: Abnormal   Collection Time: 09/03/19 12:26 PM  Result Value Ref Range   Color, Urine AMBER (A) YELLOW    Comment: BIOCHEMICALS MAY BE AFFECTED BY COLOR   APPearance CLOUDY (A) CLEAR   Specific Gravity, Urine 1.010 1.005 - 1.030   pH 7.0 5.0 - 8.0   Glucose, UA NEGATIVE NEGATIVE mg/dL   Hgb urine dipstick SMALL (A) NEGATIVE   Bilirubin Urine LARGE (A) NEGATIVE   Ketones, ur NEGATIVE NEGATIVE mg/dL   Protein, ur 30 (A) NEGATIVE mg/dL   Nitrite NEGATIVE NEGATIVE   Leukocytes,Ua LARGE (A) NEGATIVE    Comment: Performed at [facility], [address] [place], [place] [address]  Urinalysis, Microscopic (reflex)     Status: Abnormal   Collection Time: 09/03/19 12:26 PM  Result Value Ref Range   RBC / HPF 11-20 0 - 5 RBC/hpf   WBC, UA >50 0 - 5 WBC/hpf   Bacteria, UA MANY (A) NONE SEEN   Squamous Epithelial / LPF 0-5 0 - 5   WBC Clumps PRESENT     Comment: Performed at [facility], [address] [place], [place] [address]  Respiratory Panel by RT PCR (Flu A&B, Covid) - Nasopharyngeal Swab     Status: None   Collection Time:  09/03/19  1:59 PM   Specimen: Nasopharyngeal Swab  Result Value Ref Range   SARS Coronavirus 2 by RT PCR NEGATIVE NEGATIVE    Comment: (NOTE) SARS-CoV-2 target nucleic acids are NOT DETECTED. The SARS-CoV-2 RNA is generally detectable in upper respiratoy specimens during the acute phase of infection. The lowest concentration of SARS-CoV-2 viral copies this assay can detect is 131 copies/mL. A negative result does not preclude SARS-Cov-2 infection and  should not be used as the sole basis for treatment or other patient management decisions. A negative result may occur with  improper specimen collection/handling, submission of specimen other than nasopharyngeal swab, presence of viral mutation(s) within the areas targeted by this assay, and inadequate number of viral copies (<131 copies/mL). A negative result must be combined with clinical observations, patient history, and epidemiological information. The expected result is Negative. Fact Sheet for Patients:  PinkCheek.be Fact Sheet for Healthcare Providers:  GravelBags.it This test is not yet ap proved or cleared by the [place] FDA and  has been authorized for detection and/or diagnosis of SARS-CoV-2 by FDA under an Emergency Use Authorization (EUA). This EUA will remain  in effect (meaning this test can be used) for the duration of the COVID-19 declaration under Section 564(b)(1) of the Act, 21 U.S.C. section 360bbb-3(b)(1), unless the authorization is terminated or revoked sooner.    Influenza A by PCR NEGATIVE NEGATIVE   Influenza B by PCR NEGATIVE NEGATIVE    Comment: (NOTE) The Xpert Xpress SARS-CoV-2/FLU/RSV assay is intended as an aid in  the diagnosis of influenza from Nasopharyngeal swab specimens and  should not be used as a sole basis for treatment. Nasal washings and  aspirates are unacceptable for Xpert Xpress SARS-CoV-2/FLU/RSV  testing. Fact Sheet for Patients: PinkCheek.be Fact Sheet for Healthcare Providers: GravelBags.it This test is not yet approved or cleared by the [place] FDA and  has been authorized for detection and/or diagnosis of SARS-CoV-2 by  FDA under an Emergency Use Authorization (EUA). This EUA will remain  in effect (meaning this test can be used) for the duration of the  Covid-19 declaration under Section 564(b)(1) of  the Act, 21  U.S.C. section 360bbb-3(b)(1), unless the authorization is  terminated or revoked. Performed at [facility], [address] [place], [place] [address]   CBC     Status: Abnormal   Collection Time: 09/04/19  3:48 AM  Result Value Ref Range   WBC 5.6 4.0 - 10.5 K/uL   RBC 2.79 (L) 3.87 - 5.11 MIL/uL   Hemoglobin 8.9 (L) 12.0 - 15.0 g/dL   HCT 27.1 (L) 36.0 - 46.0 %   MCV 97.1 80.0 - 100.0 fL   MCH 31.9 26.0 - 34.0 pg   MCHC 32.8 30.0 - 36.0 g/dL   RDW 17.0 (H) 11.5 - 15.5 %   Platelets 136 (L) 150 - 400 K/uL   nRBC 0.0 0.0 - 0.2 %    Comment: Performed at [facility], [place] [address] [place], [place] [address]  Comprehensive metabolic panel     Status: Abnormal   Collection Time: 09/04/19  3:48 AM  Result Value Ref Range   Sodium 138 135 - 145 mmol/L   Potassium 3.3 (L) 3.5 - 5.1 mmol/L   Chloride 111 98 - 111 mmol/L   CO2 16 (L) 22 - 32 mmol/L   Glucose, Bld 93 70 - 99 mg/dL    Comment: Glucose reference range applies only to samples taken after fasting  for at least 8 hours.   BUN 27 (H) 8 - 23 mg/dL   Creatinine, Ser 1.90 (H) 0.44 - 1.00 mg/dL   Calcium 8.7 (L) 8.9 - 10.3 mg/dL   Total Protein 6.0 (L) 6.5 - 8.1 g/dL   Albumin 2.7 (L) 3.5 - 5.0 g/dL   AST 157 (H) 15 - 41 U/L   ALT 186 (H) 0 - 44 U/L   Alkaline Phosphatase 421 (H) 38 - 126 U/L   Total Bilirubin 15.4 (H) 0.3 - 1.2 mg/dL   GFR calc non Af Amer 23 (L) >60 mL/min   GFR calc Af Amer 27 (L) >60 mL/min   Anion gap 11 5 - 15    Comment: Performed at [facility], [place] [address] [place], [place] [address]  Protime-INR Once     Status: None   Collection Time: 09/04/19  7:53 AM  Result Value Ref Range   Prothrombin Time 14.5 11.4 - 15.2 seconds   INR 1.1 0.8 - 1.2    Comment: (NOTE) INR goal varies based on device and disease states. Performed at [facility], [place] [address], [place], [place] [address]    DG Chest 2  View  Result Date: 09/03/2019 CLINICAL DATA:  Generalized weakness EXAM: CHEST - 2 VIEW COMPARISON:  March 06, 2012 FINDINGS: There is marked pectus excavatum. There is slight scarring in the right mid lung. There is no edema or airspace opacity. No focal nodular lesions evident. The heart size and pulmonary vascularity within normal limits. No adenopathy. IMPRESSION: Mild scarring right mid lung. Lungs elsewhere clear. Cardiac silhouette within normal limits. No adenopathy appreciable. There is marked pectus excavatum. Electronically Signed   By: Lowella Grip III M.D.   On: 09/03/2019 12:12   US Abdomen Complete  Result Date: 09/03/2019 CLINICAL DATA:  Right upper quadrant pain EXAM: ABDOMEN ULTRASOUND COMPLETE COMPARISON:  By report from 02/05/2014. FINDINGS: Gallbladder: Gallbladder is well distended with evidence of gallbladder sludge and multiple gallstones. No wall thickening or pericholecystic fluid is noted. Common bile duct: Diameter: 14.9 mm with intrahepatic dilatation Liver: No focal lesion identified. Within normal limits in parenchymal echogenicity. Portal vein is patent on color Doppler imaging with normal direction of blood flow towards the liver. Significant intrahepatic ductal dilatation is noted. IVC: No abnormality visualized. Pancreas: Visualized portion unremarkable. Spleen: Size and appearance within normal limits. Right Kidney: Length: 7.9 cm. Chronic hydronephrosis is noted stable by report from a prior CT of 2015. Left Kidney: Length: 9.4 cm. Chronic hydronephrosis is noted stable from a prior CT examination. Abdominal aorta: No aneurysm visualized. Other findings: None. IMPRESSION: Cholelithiasis and gallbladder sludge with significant intra and extrahepatic biliary ductal dilatation. Correlation with laboratory values is recommended. These changes are likely related to a distal common bile duct stone. Further workup is recommended. Chronic hydronephrosis bilaterally stable  from a prior CT of 2015 consistent with the patient's known urinary diversion. Electronically Signed   By: Inez Catalina M.D.   On: 09/03/2019 12:37   . sodium chloride    . cefTRIAXone (ROCEPHIN)  IV        Assessment/Plan Hx bladder cancer with urinary diversion/self catheter UTI CKD stage IV -chronic hydronephrosis Check seizure disorder -Keppra Hypothyroid  Cholelithiasis/choledocholithiasis Elevated LFTs  FEN: IV fluids/n.p.o. ID: Rocephin 3/24 >> day 2 DVT: SCDs added Follow-up: TBD  Plan: She is scheduled for MRCP today, ERCP tomorrow tentatively scheduled.  She will eventually need a cholecystectomy after her ERCP, and improvement in  her LFTs.  We will follow with you.   Earnstine Regal [facility] Surgery 09/04/2019, 8:31 AM Please see Amion for pager number during day hours 7:00am-4:30pm

## 2019-09-04 NOTE — Consult Note (Addendum)
Referring Provider: Dr. Eric [place]  Primary Care Physician: Dr. Antony Salmon at [facility]  Primary Gastroenterologist:  Althia Forts   Reason for Consultation:  Gallstones, dilated common bile duct, obstructive jaundice with acute cholestasis   HPI: Glenda King is a 86 y.o. female with a past medical history of hypothyroidism, CKD stage 4 with chronic hydronephrosis, bladder cancer s/p radical cystectomy with [place] pouch in 2003 requiring self catheterization, seizures on Keppra, left parasagittal extra-axial meningioma followed by surveillance MRI by neurology at [facility] and a 1.1 x 1.5 cm cystic lesion tail of the pancreas per MRI in 2009 - 2014 without further surveillance MRIs since then. She was at church on Monday 09/01/2019 and her friend noted the patient appeared jaundiced. She contacted her PCP Dr. Antony Salmon and she was sent directly to [facility] ED. Her labs in the ED showed K+ 3.1. Cr. 2.11. Alk phos 410. AST 169. ALT 209. T. Bili 16.1. Direct bili 10. Lipase 264. WBC 5.0. Hg 9.5. Acute hepatitis panel negative.  An abdominal sonogram showed a dilated CBD 14.66m with intrahepatic dilation, distended gallbladder with evidence of gallbladder sludge and multiple gallstones without wall thickening or pericholecystic fluid. [facility] to include MRCP and most likely ERCP.   She reported not feeling well for the past month. She initially noticed a change in her bowel pattern. She previously passed a normal brown formed stool daily. Approximately, 4 weeks ago she started passing small thin narrow stools 3 to 4 hours after eating any food. No rectal bleeding or melena. No yellow or white stools. She developed generalized pruritis 4 or 5 months ago. She also noted her urine was also orange in color. She was seen by her PCP around 08/19/2019 and she was given an antibiotic for a UTI. She self catheterizes her pouch stoma  5 to  6 times daily. No other new medications over the past 6 months. No alcohol use. No N/V or upper or lower abdominal pain. She has lost 5lbs over the past 4 weeks. No fever, sweats or chills. She underwent 1 colon[facility] in her lifetime [facility] she [facility] 89[facility] No family history of liver, pancreatic or colon cancer. She has a history of a pancreatic tail cyst, last MRI in care everywhere was in 2014 which showed a 1.1 x 1.5 cm cystic lesion tail of the pancreas. She did not wish to pursue further surveillance MRIs or evaluation. [facility] She has one brother who lives nearby.   Laboratory studies 09/04/2019:  Na 138. K 3.3. Glu 93. BUN 27. Cr. 1.90. Anion gap 11. Alk phos 421. AST 157 ALT 186. T. Bili 15.4.   SARS Corona virus 2 negative 3/24. Influenza A and B negative.   Abdominal sonogram 09/03/2019: Cholelithiasis and gallbladder sludge with significant intra and extrahepatic biliary ductal dilatation. Correlation with laboratory values is recommended. These changes are likely related to a distal common bile duct stone.  Chronic hydronephrosis bilaterally stable from a prior CT of 2015 consistent with the patient's known urinary diversion    Past Medical History:  Diagnosis Date  . Anemia   . Cancer ([facility]    bladder cancer  . [place]omyopathy ([place]   . CKD (chronic kidney disease)   . Thyroid disease    hypothyroid    History reviewed. No pertinent surgical history.  Prior to Admission medications   Medication Sig Start Date End Date Taking? Authorizing Provider  aspirin EC 81 MG tablet Take 81 mg by mouth every evening.  12/06/11  Yes [provider]  co-enzyme Q-10 30 MG capsule Take 30 mg by mouth every evening.   Yes [provider]  D2000 ULTRA STRENGTH 50 MCG (2000 UT) CAPS Take 1 capsule by mouth in the morning and at bedtime.  03/18/19  Yes  [provider]  levETIRAcetam (KEPPRA) 500 MG tablet Take 500 mg by mouth 2 (two) times daily. 05/13/19  Yes [provider]  levothyroxine (SYNTHROID) 100 MCG tablet Take 100 mcg by mouth daily. 05/13/19  Yes [provider]  melatonin 5 MG TABS Take 5 mg by mouth at bedtime.    Yes [provider]  metoprolol tartrate (LOPRESSOR) 25 MG tablet Take 12.5 mg by mouth 2 (two) times daily.  02/23/14  Yes [provider]  sodium bicarbonate 650 MG tablet Take 1,300 mg by mouth 2 (two) times daily. 03/12/19  Yes [provider]    Current Facility-Administered Medications  Medication Dose Route Frequency Provider Last Rate Last Admin  . cefTRIAXone (ROCEPHIN) 1 g in sodium chloride 0.9 % 100 mL IVPB  1 g Intravenous Q24H Tu, Ching T, DO      . feeding supplement (BOOST / RESOURCE BREEZE) liquid 1 Container  1 Container Oral TID BM Tu, Ching T, DO      . levETIRAcetam (KEPPRA) tablet 500 mg  500 mg Oral BID Tu, Ching T, DO   500 mg at 09/03/19 2315  . levothyroxine (SYNTHROID) tablet 100 mcg  100 mcg Oral Q0600 Tu, Ching T, DO      . melatonin tablet 5 mg  5 mg Oral QHS Tu, Ching T, DO   5 mg at 09/03/19 2315  . metoprolol tartrate (LOPRESSOR) tablet 12.5 mg  12.5 mg Oral BID Tu, Ching T, DO   12.5 mg at 09/03/19 2316  . sodium bicarbonate tablet 1,300 mg  1,300 mg Oral BID Tu, Ching T, DO   1,300 mg at 09/03/19 2315    Allergies as of 09/03/2019 - Review Complete 09/03/2019  Allergen Reaction Noted  . Amoxicillin    . Gadolinium derivatives Other (See Comments) 04/01/2014  . Pollen extract Other (See Comments) 11/13/2012    History reviewed. No pertinent family history.  Social History   Socioeconomic History  . Marital status: Married    Spouse name: Not on file  . Number of children: Not on file  . Years of education: Not on file  . Highest education level: Not on file  Occupational History  . Not on file  Tobacco Use  . Smoking  status: Never Smoker  . Smokeless tobacco: Never Used  Substance and Sexual Activity  . Alcohol use: Not Currently  . Drug use: Not Currently  . Sexual activity: Not on file  Other Topics Concern  . Not on file  Social History Narrative  . Not on file   Social Determinants of Health   Financial Resource Strain:   . Difficulty of Paying Living Expenses:   Food Insecurity:   . Worried About Charity fundraiser in the Last Year:   . Arboriculturist in the Last Year:   Transportation Needs:   . Film/video editor (Medical):   Marland Kitchen Lack of Transportation (Non-Medical):   Physical Activity:   . Days of Exercise per Week:   . Minutes  of Exercise per Session:   Stress:   . Feeling of Stress :   Social Connections:   . Frequency of Communication with Friends and Family:   . Frequency of Social Gatherings with Friends and Family:   . Attends Religious Services:   . Active Member of Clubs or Organizations:   . Attends Archivist Meetings:   Marland Kitchen Marital Status:   Intimate Partner Violence:   . Fear of Current or Ex-Partner:   . Emotionally Abused:   Marland Kitchen Physically Abused:   . Sexually Abused:     Review of Systems: Gen: + 5lb weight loss. Denies fever, sweats or chills.  CV: Denies chest pain, palpitations or edema. Resp: Denies cough, shortness of breath of hemoptysis.  GI: Denies heartburn, dysphagia, stomach or lower abdominal pain. No diarrhea or constipation. No rectal bleeding or melena.   GU : Self catheterizes pouch, urine darker orange. MS: Denies joint pain, muscles aches or weakness. Derm: Denies rash, itchiness, skin lesions or unhealing ulcers. Psych: Denies depression, anxiety, memory loss or confusion.  Heme: Denies easy bruising, bleeding. Neuro:  Denies headaches, dizziness or paresthesias. Endo:  Denies any problems with DM, thyroid or adrenal function.  Physical Exam: Vital signs in last 24 hours: Temp:  [97.8 F (36.6 C)-98.5 F (36.9 C)] 98.5  F (36.9 C) (03/25 0456) Pulse Rate:  [54-77] 61 (03/25 0456) Resp:  [16-25] 18 (03/25 0230) BP: (113-149)/(51-92) 114/51 (03/25 0456) SpO2:  [95 %-100 %] 97 % (03/25 0456) Weight:  [44.9 kg-45.7 kg] 44.9 kg (03/24 2111) Last BM Date: 09/03/19 General:  Thin 86 year old female, alert in NAD.  Head:  Normocephalic and atraumatic. Eyes:  Scleral icterus present.  Ears:  Normal auditory acuity. Nose:  No deformity, discharge or lesions. Mouth: Dentition intact. No ulcers or lesions.  Neck:  Supple. No lymphadenopathy or thyromegaly.  Lungs:  Few scattered expiratory wheezes otherwise clear throughout.  Heart:  RRR, 1/6 systolic murmur.  Abdomen:  Flat, soft, nontender. RLQ urinary pouch stoma intact. Aorta diameter > 2cm and palpable, + LUQ bruit (I spoke to hospitalist Dr. Tyrell Antonio regarding abdominal exam).  Rectal:  Deferred. Msk:  Symmetrical without gross deformities.  Pulses:  Normal pulses noted. Extremities:  Without clubbing or edema. Neurologic:  Alert and  oriented x4. No focal deficits.  Skin:  Jaundice. Psych:  Alert and cooperative. Normal mood and affect.  Intake/Output from previous day: 03/24 0701 - 03/25 0700 In: 318.1 [P.O.:240; IV Piggyback:78.1] Out: 500 [Urine:500] Intake/Output this shift: Total I/O In: 318.1 [P.O.:240; IV Piggyback:78.1] Out: 500 [Urine:500]  Lab Results: Recent Labs    09/03/19 1130 09/04/19 0348  WBC 5.0 5.6  HGB 9.5* 8.9*  HCT 28.4* 27.1*  PLT 153 136*   BMET Recent Labs    09/03/19 1130  NA 136  K 3.1*  CL 108  CO2 18*  GLUCOSE 147*  BUN 29*  CREATININE 2.11*  CALCIUM 8.9   LFT Recent Labs    09/03/19 1130  PROT 6.9  ALBUMIN 2.9*  AST 169*  ALT 209*  ALKPHOS 410*  BILITOT 16.1*  BILIDIR 10.0*  IBILI 6.1*   PT/INR No results for input(s): LABPROT, INR in the last 72 hours. Hepatitis Panel Recent Labs    09/03/19 1130  HEPBSAG NON REACTIVE  HCVAB NON REACTIVE  HEPAIGM NON REACTIVE  HEPBIGM NON  REACTIVE      Studies/Results: DG Chest 2 View  Result Date: 09/03/2019 CLINICAL DATA:  Generalized weakness EXAM: CHEST -  2 VIEW COMPARISON:  March 06, 2012 FINDINGS: There is marked pectus excavatum. There is slight scarring in the right mid lung. There is no edema or airspace opacity. No focal nodular lesions evident. The heart size and pulmonary vascularity within normal limits. No adenopathy. IMPRESSION: Mild scarring right mid lung. Lungs elsewhere clear. Cardiac silhouette within normal limits. No adenopathy appreciable. There is marked pectus excavatum. Electronically Signed   By: Lowella Grip III M.D.   On: 09/03/2019 12:12   US Abdomen Complete  Result Date: 09/03/2019 CLINICAL DATA:  Right upper quadrant pain EXAM: ABDOMEN ULTRASOUND COMPLETE COMPARISON:  By report from 02/05/2014. FINDINGS: Gallbladder: Gallbladder is well distended with evidence of gallbladder sludge and multiple gallstones. No wall thickening or pericholecystic fluid is noted. Common bile duct: Diameter: 14.9 mm with intrahepatic dilatation Liver: No focal lesion identified. Within normal limits in parenchymal echogenicity. Portal vein is patent on color Doppler imaging with normal direction of blood flow towards the liver. Significant intrahepatic ductal dilatation is noted. IVC: No abnormality visualized. Pancreas: Visualized portion unremarkable. Spleen: Size and appearance within normal limits. Right Kidney: Length: 7.9 cm. Chronic hydronephrosis is noted stable by report from a prior CT of 2015. Left Kidney: Length: 9.4 cm. Chronic hydronephrosis is noted stable from a prior CT examination. Abdominal aorta: No aneurysm visualized. Other findings: None. IMPRESSION: Cholelithiasis and gallbladder sludge with significant intra and extrahepatic biliary ductal dilatation. Correlation with laboratory values is recommended. These changes are likely related to a distal common bile duct stone. Further workup is  recommended. Chronic hydronephrosis bilaterally stable from a prior CT of 2015 consistent with the patient's known urinary diversion. Electronically Signed   By: Inez Catalina M.D.   On: 09/03/2019 12:37    IMPRESSION/PLAN:  13. 86 year old female with painless jaundice presented to [facility] ED on 3/24. Laboratory studies showed acute cholestasis. Abdominal sonogram showed gallstones with a dilated CBD 14.65m, multiple gallstones with intra and extra hepatic dilatation. Labs and sono concerning for choledocholithiasis verse biliary malignancy.  Alk pho 410 -> 421. AST 169 -> 157. ALT 209 -> 186. T. Bili 16.1 -> 15.4. Lipase 264.  WBC 5.6. She is afebrile. Hemodynamically stable.   -Hepatic panel, Lipase, PT/INR -NPO -Stat Abd MRI without contrast/MRCP today -ERCP with Dr. PHenrene PastorFriday 09/05/2019. ERCP benefits and risks discussed with the patient including risk with general anesthesia, risk of bleeding, perforation,  infection and pancreatitis -She is at risk for cholangitis, she in on Rocephin for UTI. If she develops fever or leukocytosis she will require Zosyn  -Zofran '4mg'$  IV PRN nausea -IVF NS @ 100cc/hr  2. Acute on chronic anemia, dilutional component.  Hg 10.1- > 8.9. HCT 27.1. MCV 97.1. No signs of active GI bleeding -Repeat CBC in am  3. Thrombocytopenia, acute. PLT 153 - 136.   4. CKD stage 4. ( Baseline Cr. 1.97)  5. History of bladder cancer 2003, self catheterizes [place] pouch.  6. UTI. On Rocephin.   7. Hypokalemia. K+ 3.3 -Management per the hospitalist   8. LUQ abdominal bruit on exam, aorta pulsatile. Abdominal sono without evidence of an aortic aneurysm. (Abdominal exam discussed with Dr. RTyrell Antonio.  Further recommendations per Dr. PVerta EllenMDorathy Daft 09/04/2019, 5:16 AM  GI ATTENDING  History, laboratories, x-rays reviewed.  Patient seen and examined.  Agree with comprehensive consultation note as outlined above.  Elderly female who  presents with painless jaundice.  Differential diagnosis is choledocholithiasis (given gallbladder stones) or malignancy  such as pancreatic cancer.  I am concerned about the latter given the painless nature of the presenting jaundice, pruritus, and weight loss.  MRCP would be the next step.  I do not believe that she will need surgery if she has simple choledocholithiasis and we are able to clear the duct, given her age.  As well, she would not be a surgical candidate given her age, should she have pancreatic cancer.  Patient scheduled for ERCP tomorrow at 1 PM.  Await MRCP results.  She is high risk given her age and comorbidities.The nature of the procedure, as well as the risks, benefits, and alternatives were carefully and thoroughly reviewed with the patient. Ample time for discussion and questions allowed. The patient understood, was satisfied, and agreed to proceed.  Docia Chuck. Geri [place]in[place]., M.D. [facility] Division of Gastroenterology

## 2019-09-05 ENCOUNTER — Inpatient Hospital Stay (HOSPITAL_COMMUNITY): Payer: Medicare Other | Admitting: Certified Registered"

## 2019-09-05 ENCOUNTER — Encounter (HOSPITAL_COMMUNITY): Payer: Self-pay | Admitting: Family Medicine

## 2019-09-05 ENCOUNTER — Encounter (HOSPITAL_COMMUNITY)
Admission: EM | Disposition: A | Discharge status: Home / Self Care | Payer: Self-pay | Source: Home / Self Care | Attending: Internal Medicine

## 2019-09-05 ENCOUNTER — Inpatient Hospital Stay (HOSPITAL_COMMUNITY): Payer: Medicare Other

## 2019-09-05 DIAGNOSIS — K831 Obstruction of bile duct: Secondary | ICD-10-CM

## 2019-09-05 HISTORY — PX: ERCP: SHX5425

## 2019-09-05 HISTORY — PX: SPHINCTEROTOMY: SHX5544

## 2019-09-05 HISTORY — PX: BILIARY STENT PLACEMENT: SHX5538

## 2019-09-05 LAB — CBC
HCT: 29.1 % — ABNORMAL LOW (ref 36.0–46.0)
Hemoglobin: 9.6 g/dL — ABNORMAL LOW (ref 12.0–15.0)
MCH: 32.3 pg (ref 26.0–34.0)
MCHC: 33 g/dL (ref 30.0–36.0)
MCV: 98 fL (ref 80.0–100.0)
Platelets: 142 10*3/uL — ABNORMAL LOW (ref 150–400)
RBC: 2.97 MIL/uL — ABNORMAL LOW (ref 3.87–5.11)
RDW: 17.8 % — ABNORMAL HIGH (ref 11.5–15.5)
WBC: 5.2 10*3/uL (ref 4.0–10.5)
nRBC: 0 % (ref 0.0–0.2)

## 2019-09-05 LAB — CBC WITH DIFFERENTIAL/PLATELET
Abs Immature Granulocytes: 0.02 10*3/uL (ref 0.00–0.07)
Basophils Absolute: 0 10*3/uL (ref 0.0–0.1)
Basophils Relative: 1 %
Eosinophils Absolute: 0.1 10*3/uL (ref 0.0–0.5)
Eosinophils Relative: 3 %
HCT: 23.8 % — ABNORMAL LOW (ref 36.0–46.0)
Hemoglobin: 7.8 g/dL — ABNORMAL LOW (ref 12.0–15.0)
Immature Granulocytes: 1 %
Lymphocytes Relative: 11 %
Lymphs Abs: 0.4 10*3/uL — ABNORMAL LOW (ref 0.7–4.0)
MCH: 31.8 pg (ref 26.0–34.0)
MCHC: 32.8 g/dL (ref 30.0–36.0)
MCV: 97.1 fL (ref 80.0–100.0)
Monocytes Absolute: 0.4 10*3/uL (ref 0.1–1.0)
Monocytes Relative: 9 %
Neutro Abs: 2.9 10*3/uL (ref 1.7–7.7)
Neutrophils Relative %: 75 %
Platelets: 129 10*3/uL — ABNORMAL LOW (ref 150–400)
RBC: 2.45 MIL/uL — ABNORMAL LOW (ref 3.87–5.11)
RDW: 17.2 % — ABNORMAL HIGH (ref 11.5–15.5)
WBC: 3.9 10*3/uL — ABNORMAL LOW (ref 4.0–10.5)
nRBC: 0 % (ref 0.0–0.2)

## 2019-09-05 LAB — HEPATIC FUNCTION PANEL
ALT: 167 U/L — ABNORMAL HIGH (ref 0–44)
AST: 181 U/L — ABNORMAL HIGH (ref 15–41)
Albumin: 2.3 g/dL — ABNORMAL LOW (ref 3.5–5.0)
Alkaline Phosphatase: 454 U/L — ABNORMAL HIGH (ref 38–126)
Bilirubin, Direct: 9.2 mg/dL — ABNORMAL HIGH (ref 0.0–0.2)
Indirect Bilirubin: 5.1 mg/dL — ABNORMAL HIGH (ref 0.3–0.9)
Total Bilirubin: 14.3 mg/dL — ABNORMAL HIGH (ref 0.3–1.2)
Total Protein: 5.4 g/dL — ABNORMAL LOW (ref 6.5–8.1)

## 2019-09-05 LAB — BASIC METABOLIC PANEL
Anion gap: 7 (ref 5–15)
BUN: 24 mg/dL — ABNORMAL HIGH (ref 8–23)
CO2: 14 mmol/L — ABNORMAL LOW (ref 22–32)
Calcium: 8.5 mg/dL — ABNORMAL LOW (ref 8.9–10.3)
Chloride: 117 mmol/L — ABNORMAL HIGH (ref 98–111)
Creatinine, Ser: 1.69 mg/dL — ABNORMAL HIGH (ref 0.44–1.00)
GFR calc Af Amer: 31 mL/min — ABNORMAL LOW (ref >60)
GFR calc non Af Amer: 27 mL/min — ABNORMAL LOW (ref >60)
Glucose, Bld: 73 mg/dL (ref 70–99)
Potassium: 4.3 mmol/L (ref 3.5–5.1)
Sodium: 138 mmol/L (ref 135–145)

## 2019-09-05 LAB — URINE CULTURE: Culture: NO GROWTH

## 2019-09-05 LAB — LACTIC ACID, PLASMA: Lactic Acid, Venous: 1.2 mmol/L (ref 0.5–1.9)

## 2019-09-05 LAB — LIPASE, BLOOD: Lipase: 351 U/L — ABNORMAL HIGH (ref 11–51)

## 2019-09-05 LAB — GLUCOSE, CAPILLARY: Glucose-Capillary: 85 mg/dL (ref 70–99)

## 2019-09-05 IMAGING — RF DG ERCP WO/W SPHINCTEROTOMY
1 series · 15 of 24 positions shown · non-contrast
Comparison: MRI/MRCP on [DATE]

CLINICAL DATA: Biliary obstruction.

EXAM:
ERCP
TECHNIQUE: Multiple spot images obtained with the fluoroscopic device and
submitted for interpretation post-procedure.

[Series 1: run · 15 of 32 slices shown]
[im 1/32]
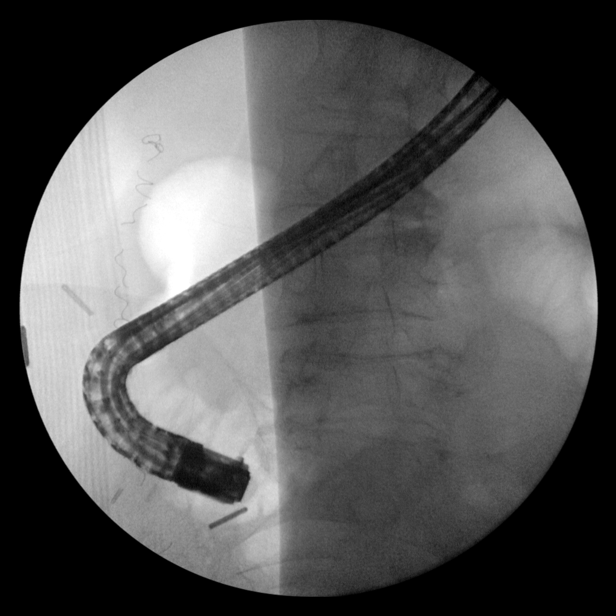
[im 3/32]
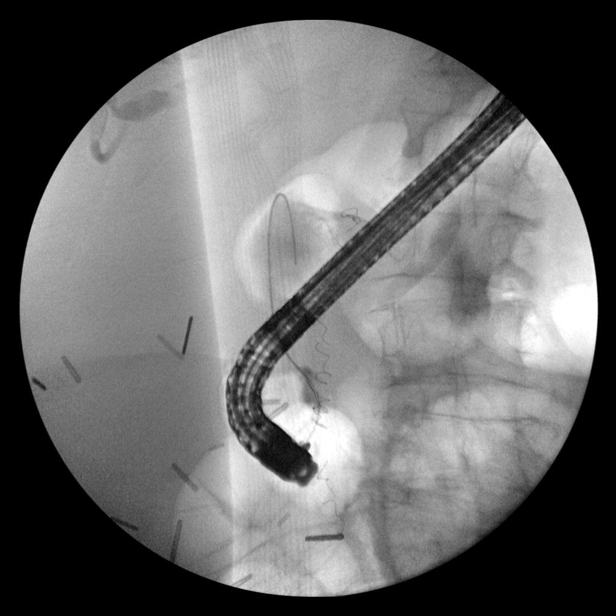
[im 6/32]
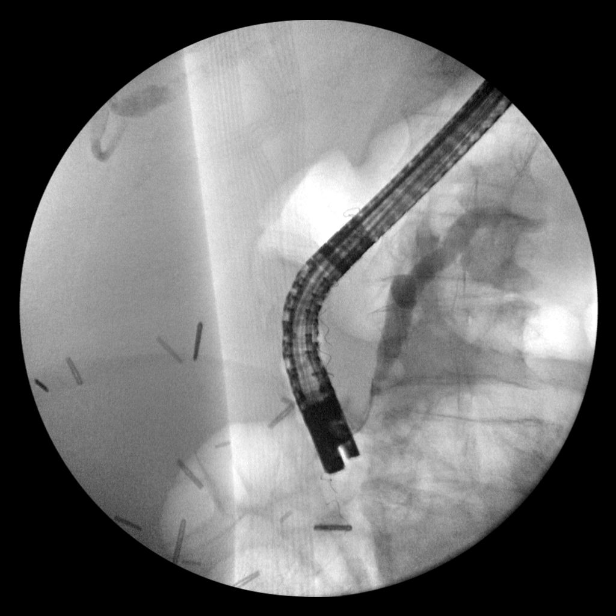
[im 7/32]
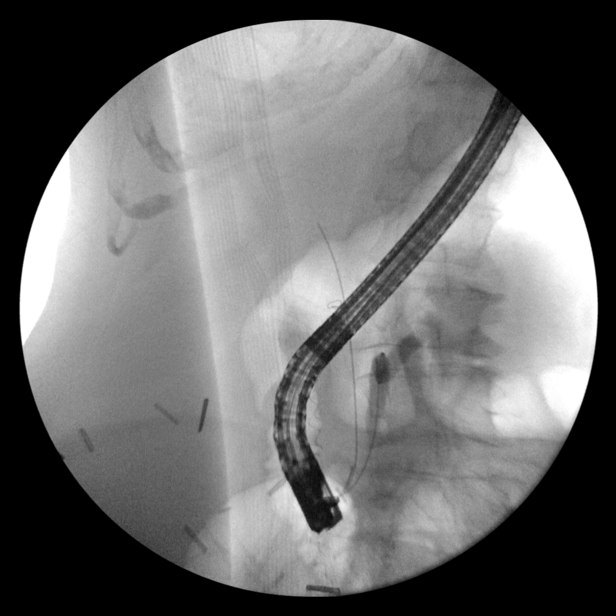
[im 10/32]
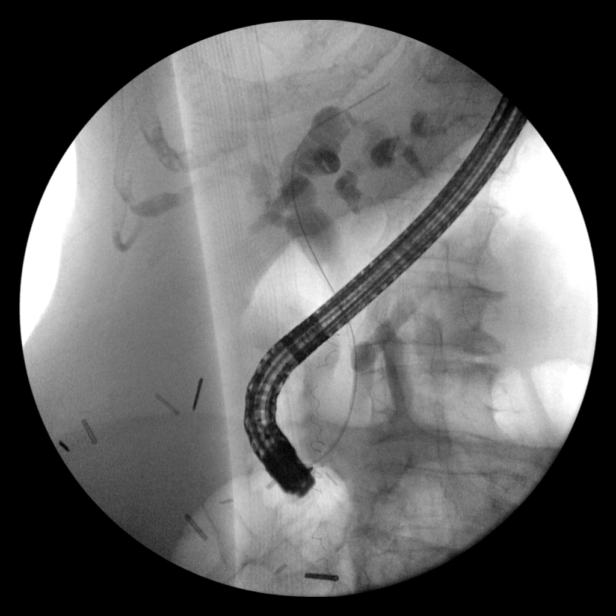
[im 11/32]
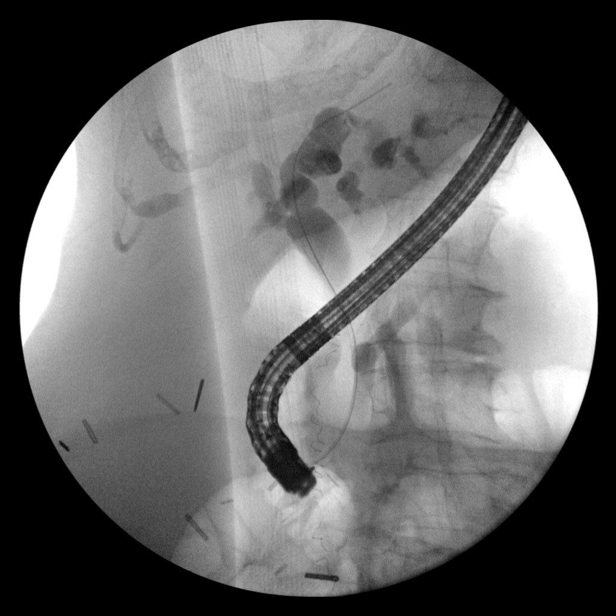
[im 14/32]
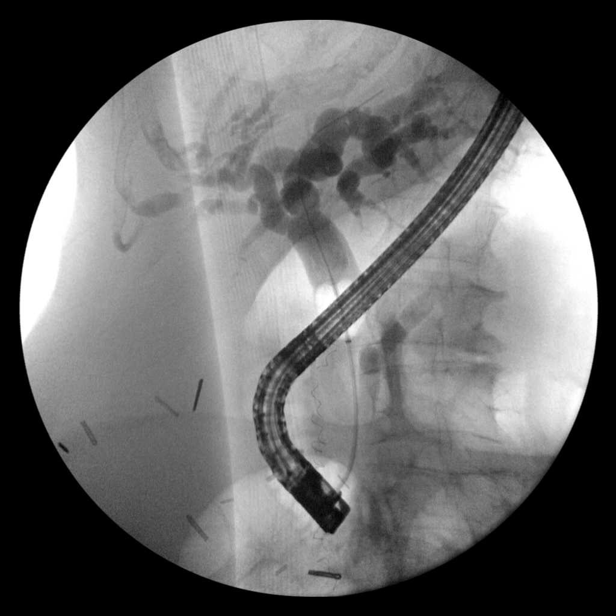
[im 17/32]
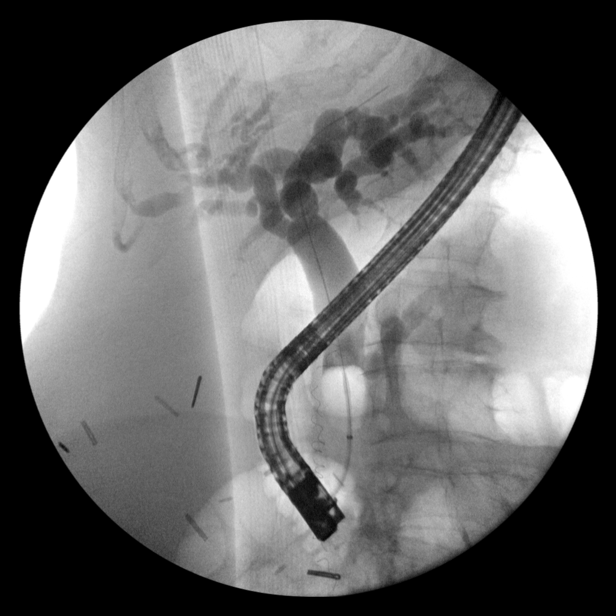
[im 18/32]
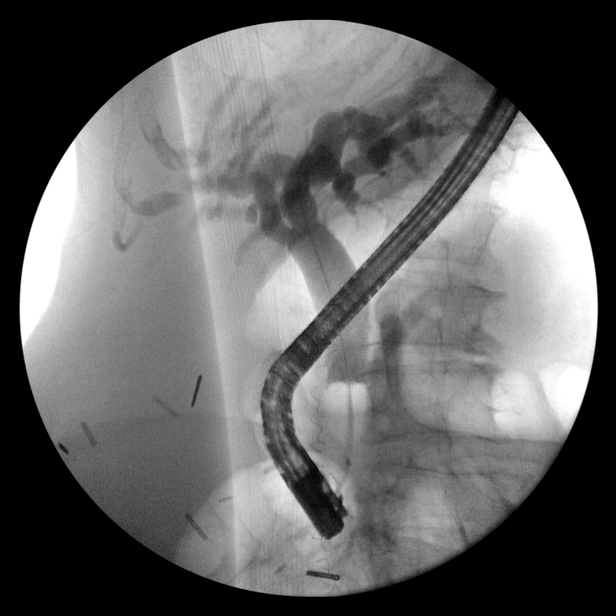
[im 21/32]
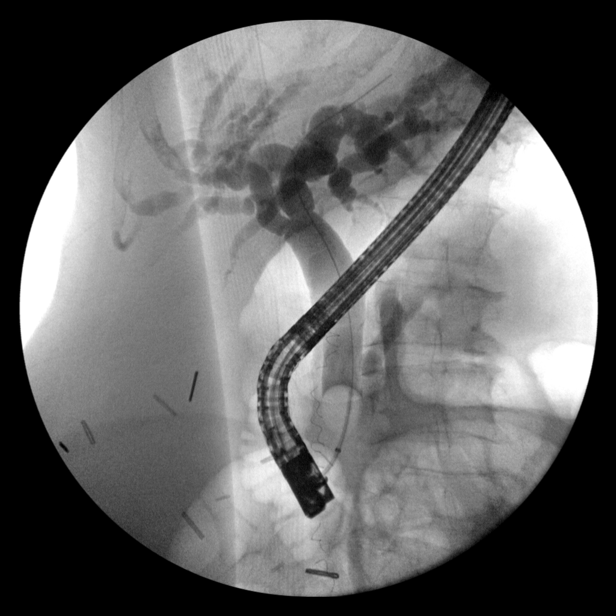
[im 22/32]
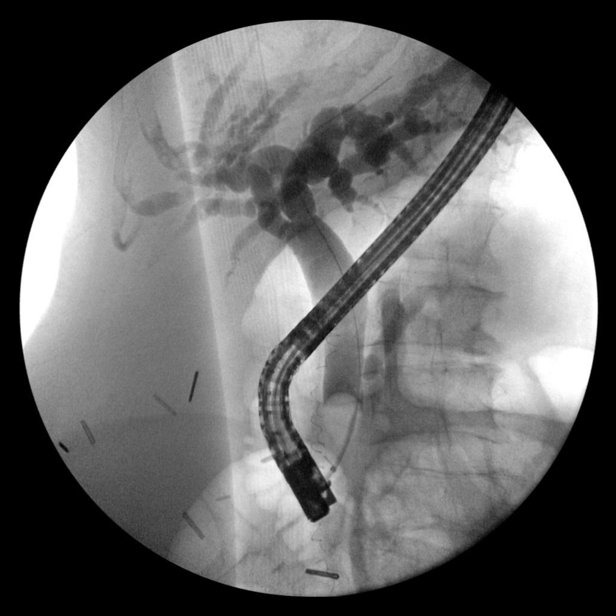
[im 25/32]
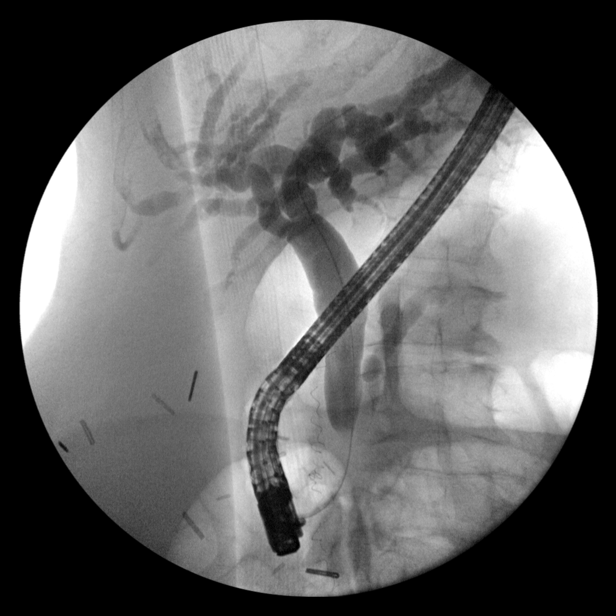
[im 27/32]
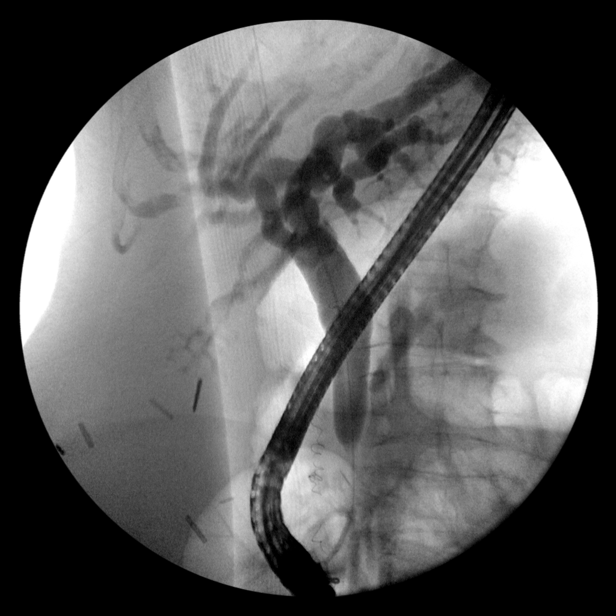
[im 29/32]
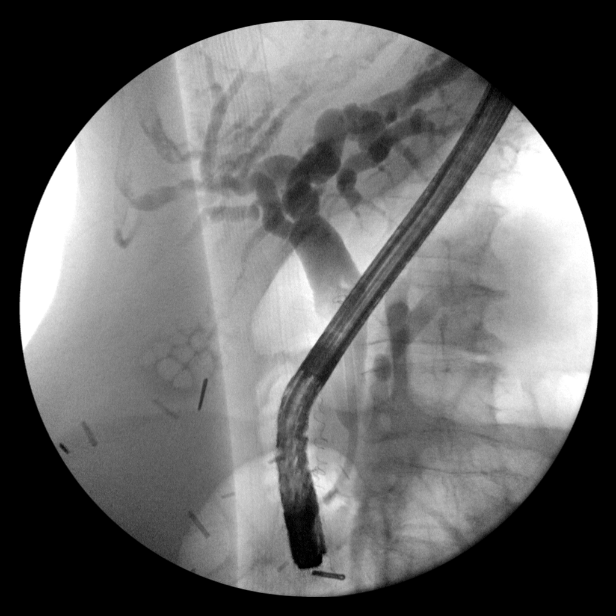
[im 32/32]
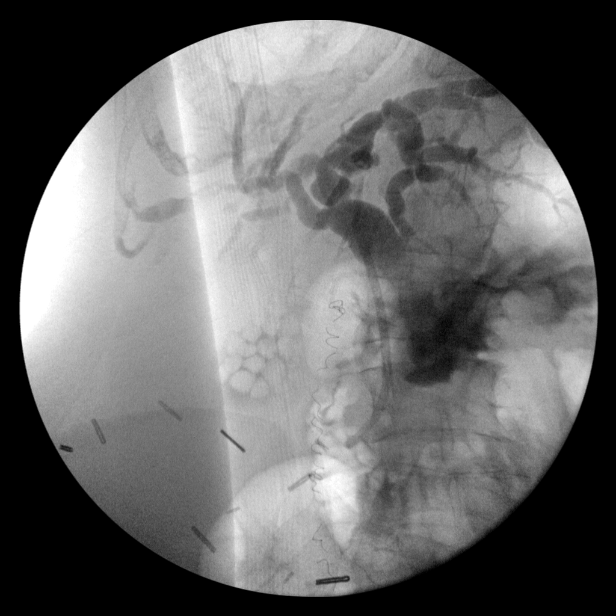

[15 of 24 positions shown; findings below may reference images not displayed]

FINDINGS: Imaging obtained with a C-arm during ERCP demonstrates cannulation
of the pancreatic duct with contrast injection demonstrating diffuse
dilatation of the visualized pancreatic duct. After cannulation of
the common bile duct, there is evidence diffuse dilatation of the
common bile duct and also intrahepatic bile ducts. No discrete
filling defects are identified. There may be a component of distal
stricture of the CBD. A balloon sweep maneuver was performed
followed by placement of an endoscopic biliary stent into the common
bile duct. This appears well position.
IMPRESSION: Diffuse biliary dilatation secondary to potentially a focal
stricture of the distal CBD. An endoscopic biliary stent was placed
in the common bile duct.

These images were submitted for radiologic interpretation only.
Please see the procedural report for the amount of contrast and the
fluoroscopy time utilized.

## 2019-09-05 SURGERY — ERCP, WITH INTERVENTION IF INDICATED
Anesthesia: General

## 2019-09-05 MED ORDER — INDOMETHACIN 50 MG RE SUPP
100.0000 mg | Freq: Once | RECTAL | Status: DC
  Filled 2019-09-05: qty 2

## 2019-09-05 MED ORDER — SODIUM CHLORIDE 0.9 % IV SOLN
INTRAVENOUS | Status: DC
  Administered 2019-09-05: 500 mL via INTRAVENOUS

## 2019-09-05 MED ORDER — FENTANYL CITRATE (PF) 100 MCG/2ML IJ SOLN
INTRAMUSCULAR | Status: AC
  Filled 2019-09-05: qty 2

## 2019-09-05 MED ORDER — INDOMETHACIN 50 MG RE SUPP
RECTAL | Status: DC | PRN
  Administered 2019-09-05: 100 mg via RECTAL

## 2019-09-05 MED ORDER — DEXAMETHASONE SODIUM PHOSPHATE 10 MG/ML IJ SOLN
INTRAMUSCULAR | Status: DC | PRN
  Administered 2019-09-05: 8 mg via INTRAVENOUS

## 2019-09-05 MED ORDER — PHENYLEPHRINE 40 MCG/ML (10ML) SYRINGE FOR IV PUSH (FOR BLOOD PRESSURE SUPPORT)
PREFILLED_SYRINGE | INTRAVENOUS | Status: DC | PRN
  Administered 2019-09-05: 120 ug via INTRAVENOUS

## 2019-09-05 MED ORDER — INDOMETHACIN 50 MG RE SUPP
RECTAL | Status: AC
  Filled 2019-09-05: qty 2

## 2019-09-05 MED ORDER — ONDANSETRON HCL 4 MG/2ML IJ SOLN
INTRAMUSCULAR | Status: DC | PRN
  Administered 2019-09-05: 4 mg via INTRAVENOUS

## 2019-09-05 MED ORDER — PROPOFOL 10 MG/ML IV BOLUS
INTRAVENOUS | Status: DC | PRN
  Administered 2019-09-05: 90 mg via INTRAVENOUS

## 2019-09-05 MED ORDER — PHENYLEPHRINE HCL-NACL 10-0.9 MG/250ML-% IV SOLN
INTRAVENOUS | Status: DC | PRN
  Administered 2019-09-05: 25 ug/min via INTRAVENOUS
  Administered 2019-09-05: 40 ug/min via INTRAVENOUS

## 2019-09-05 MED ORDER — ROCURONIUM BROMIDE 10 MG/ML (PF) SYRINGE
PREFILLED_SYRINGE | INTRAVENOUS | Status: DC | PRN
  Administered 2019-09-05: 60 mg via INTRAVENOUS

## 2019-09-05 MED ORDER — FENTANYL CITRATE (PF) 100 MCG/2ML IJ SOLN
INTRAMUSCULAR | Status: DC | PRN
  Administered 2019-09-05 (×2): 50 ug via INTRAVENOUS
  Administered 2019-09-05: 25 ug via INTRAVENOUS

## 2019-09-05 MED ORDER — SODIUM CHLORIDE 0.9 % IV SOLN
INTRAVENOUS | Status: DC | PRN
  Administered 2019-09-05: 100 mL

## 2019-09-05 MED ORDER — SUGAMMADEX SODIUM 200 MG/2ML IV SOLN
INTRAVENOUS | Status: DC | PRN
  Administered 2019-09-05 (×2): 100 mg via INTRAVENOUS

## 2019-09-05 MED ORDER — PROPOFOL 500 MG/50ML IV EMUL
INTRAVENOUS | Status: AC
  Filled 2019-09-05: qty 50

## 2019-09-05 MED ORDER — GLUCAGON HCL RDNA (DIAGNOSTIC) 1 MG IJ SOLR
INTRAMUSCULAR | Status: AC
  Filled 2019-09-05: qty 1

## 2019-09-05 MED ORDER — NALOXONE HCL 0.4 MG/ML IJ SOLN
INTRAMUSCULAR | Status: DC | PRN
  Administered 2019-09-05: .2 mg via INTRAVENOUS

## 2019-09-05 MED ORDER — LACTATED RINGERS IV SOLN: INTRAVENOUS | Status: DC | PRN

## 2019-09-05 MED ORDER — PROPOFOL 10 MG/ML IV BOLUS
INTRAVENOUS | Status: AC
  Filled 2019-09-05: qty 20

## 2019-09-05 MED ORDER — PROPOFOL 500 MG/50ML IV EMUL
INTRAVENOUS | Status: AC
  Filled 2019-09-05: qty 100

## 2019-09-05 MED ORDER — SODIUM BICARBONATE-DEXTROSE 150-5 MEQ/L-% IV SOLN
150.0000 meq | INTRAVENOUS | Status: DC
  Administered 2019-09-05: 150 meq via INTRAVENOUS
  Filled 2019-09-05 (×5): qty 1000

## 2019-09-05 NOTE — Progress Notes (Addendum)
Patient with shallow respirations not responding to verbal stimuli Dr Smith Robert called.

## 2019-09-05 NOTE — Anesthesia Preprocedure Evaluation (Addendum)
Anesthesia Evaluation  Patient identified by MRN, date of birth, ID band Patient awake    Reviewed: Allergy & Precautions, NPO status , Patient's Chart, lab work & pertinent test results, reviewed documented beta blocker date and time   Airway Mallampati: I  TM Distance: >3 FB Neck ROM: Full    Dental  (+) Teeth Intact, Dental Advisory Given   Pulmonary neg pulmonary ROS,    breath sounds clear to auscultation       Cardiovascular negative cardio ROS   Rhythm:Regular Rate:Normal     Neuro/Psych negative neurological ROS  negative psych ROS   GI/Hepatic negative GI ROS,   Endo/Other  Hypothyroidism   Renal/GU CRFRenal disease     Musculoskeletal negative musculoskeletal ROS (+)   Abdominal Normal abdominal exam  (+)   Peds  Hematology negative hematology ROS (+)   Anesthesia Other Findings   Reproductive/Obstetrics                            Anesthesia Physical Anesthesia Plan  ASA: III  Anesthesia Plan: General   Post-op Pain Management:    Induction: Intravenous  PONV Risk Score and Plan: 3 and Ondansetron and Treatment may vary due to age or medical condition  Airway Management Planned: Oral ETT  Additional Equipment: None  Intra-op Plan:   Post-operative Plan: Extubation in OR  Informed Consent: I have reviewed the patients History and Physical, chart, labs and discussed the procedure including the risks, benefits and alternatives for the proposed anesthesia with the patient or authorized representative who has indicated his/her understanding and acceptance.     Dental advisory given  Plan Discussed with: CRNA  Anesthesia Plan Comments:        Anesthesia Quick Evaluation

## 2019-09-05 NOTE — Op Note (Signed)
[facility] Patient Name: Glenda King Procedure Date: 09/05/2019 MRN: [id number] Attending MD: Docia Chuck. Henrene Pastor , MD Date of Birth: 25-Jan-1929 CSN: [id number] Age: 86 Admit Type: Outpatient Procedure:                ERCP with sphincterotomy; with biliary stent                            placement Indications:              Abnormal MRCP, Biliary dilation on Ultrasound,                            Jaundice. MRCP was read as showing biliary and                            pancreatic ductal dilation without pancreatic mass.                            Said to have small distal stones. My review of the                            MRCP questioned stones. In addition elevated liver                            tests jaundice patient had elevation of lipase                            without abdominal pain. She has had some weight                            loss and pruritus. Providers:                Docia Chuck. Henrene Pastor, MD, Cleda Daub, RN, Janeece Agee,                            Technician, Dayle Points, CRNA Referring MD:             [facility] Medicines:                General Anesthesia Complications:            No immediate complications. Estimated Blood Loss:     Estimated blood loss was minimal. Procedure:                Pre-Anesthesia Assessment:                           - Prior to the procedure, a History and Physical                            was performed, and patient medications and                            allergies were reviewed. The patient's tolerance of  previous anesthesia was also reviewed. The risks                            and benefits of the procedure and the sedation                            options and risks were discussed with the patient.                            All questions were answered, and informed consent                            was obtained. Prior Anticoagulants: The patient has      taken no previous anticoagulant or antiplatelet                            agents. ASA Grade Assessment: II - A patient with                            mild systemic disease. After reviewing the risks                            and benefits, the patient was deemed in                            satisfactory condition to undergo the procedure.                           After obtaining informed consent, the scope was                            passed under direct vision. Throughout the                            procedure, the patient's blood pressure, pulse, and                            oxygen saturations were monitored continuously. The                            [id number]-[id number] ([id number]) Olympus was introduced through                            the mouth, and used to inject contrast into and                            used to inject contrast into the bile duct and                            ventral pancreatic duct. The ERCP was accomplished                            without difficulty. The patient tolerated the  procedure well. Scope In: Scope Out: Findings:      1. The esophagus was not examined      2. The stomach and duodenum revealed duodenitis and gastritis      3. The major ampulla was bulbous. The minor ampulla was not sought.      4. Scout radiograph of the abdomen with the endoscope in position       revealed multiple surgical clips (nonbiliary)      5. Initial injection of contrast revealed a dilated pancreatic duct      6. Selective cannulation of the bile duct was confounded by false lumen.      7. Subsequently, a guidewire was purposely placed in the pancreatic duct       to assist with biliary cannulation. This was subsequently achieved and       the pancreatic duct wire removed.      8. The entire biliary tree was opacified and found to be markedly       dilated with maximal bile duct diameter of approximately 18 mm. There       was abrupt  narrowing in the distal portion where the ampulla raising the       question of a small stricture. I did not appreciate obvious stones.      9. A cystic duct filled and multiple gallstones noted      10. A moderate to large biliary sphincterotomy was performed cutting       over the biliary guidewire in the 12:00 orientation via the ERBE system.       There was minor oozing. Subsequently, a 15-18 mm balloon was pulled       through the biliary tree multiple times. No stones extracted. Bile was       dark as to suggest chronic obstruction. No purulence.      11. To assure drainage, a 10 French 7 cm in length plastic biliary       endoprosthesis was placed into the common bile duct in good position.       Excellent drainage noted. Impression:               1. 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 No obvious common duct                            stones. Status post sphincterotomy and biliary                            stent placement                           2. Cholelithiasis                           3. Dilated pancreatic duct Moderate Sedation:      none Recommendation:           -1. Observe patient's clinical course.  2. Standard post ERCP care                           3. Continue antibiotics for 24 hours then stop                           4. Trend liver tests                           5. If doing well tomorrow may be discharged home                           6. I would recommend the patient undergo endoscopic                            ultrasound of this region, possible spyglass                            assessment as well. Will discuss with advanced                            biliary partner, Dr. Rush Landmark Procedure Code(s):        --- Professional ---                           [phone], Endoscopic retrograde                             cholangiopancreatography (ERCP); with placement of                            endoscopic stent into biliary or pancreatic duct,                            including pre- and post-dilation and guide wire                            passage, when performed, including sphincterotomy,                            when performed, each stent Diagnosis Code(s):        --- Professional ---                           K83.8, Other specified diseases of biliary tract                           R17, Unspecified jaundice                           R93.2, Abnormal findings on diagnostic imaging of                            liver and biliary tract CPT copyright 2019 [employer]. All rights reserved. The codes documented in this report are preliminary and upon coder review  may  be revised to meet current compliance requirements. Docia Chuck. Henrene Pastor, MD 09/05/2019 2:51:16 PM This report has been signed electronically. Number of Addenda: 0

## 2019-09-05 NOTE — Anesthesia Procedure Notes (Signed)
Procedure Name: Intubation Date/Time: 09/05/2019 1:27 PM Performed by: Silas Sacramento, CRNA Pre-anesthesia Checklist: Patient identified, Emergency Drugs available, Suction available and Patient being monitored Patient Re-evaluated:Patient Re-evaluated prior to induction Oxygen Delivery Method: Circle system utilized Preoxygenation: Pre-oxygenation with 100% oxygen Induction Type: IV induction Ventilation: Mask ventilation without difficulty Laryngoscope Size: Mac and 4 Grade View: Grade I Tube type: Oral Tube size: 7.0 mm Number of attempts: 1 Airway Equipment and Method: Stylet Placement Confirmation: ETT inserted through vocal cords under direct vision,  positive ETCO2 and breath sounds checked- equal and bilateral Secured at: 22 cm Tube secured with: Tape Dental Injury: Teeth and Oropharynx as per pre-operative assessment

## 2019-09-05 NOTE — Transfer of Care (Signed)
Immediate Anesthesia Transfer of Care Note  Patient: Glenda King  Procedure(s) Performed: ENDOSCOPIC RETROGRADE CHOLANGIOPANCREATOGRAPHY (ERCP) (N/A )  Patient Location: Endoscopy Unit  Anesthesia Type:General  Level of Consciousness: drowsy, patient cooperative and responds to stimulation  Airway & Oxygen Therapy: Patient Spontanous Breathing and Patient connected to face mask oxygen  Post-op Assessment: Report given to RN and Post -op Vital signs reviewed and stable  Post vital signs: Reviewed and stable  Last Vitals:  Vitals Value Taken Time  BP 117/49 09/05/19 1500  Temp 36.5 C 09/05/19 1457  Pulse 60 09/05/19 1505  Resp 15 09/05/19 1505  SpO2 100 % 09/05/19 1505  Vitals shown include unvalidated device data.  Last Pain:  Vitals:   09/05/19 1457  TempSrc: Temporal  PainSc: 0-No pain         Complications: No apparent anesthesia complications

## 2019-09-05 NOTE — Progress Notes (Signed)
Pt completed ERCP today, it showed: 1. 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 No obvious common duct stones. Status post sphincterotomy and biliary stent placement 2. Cholelithiasis 3. Dilated pancreatic duct  Dr. Georgette Dover has reviewed ERCP and it is his opinion we should not do a cholecystectomy at this time. She will need further work up including EUS.  Pending those results we can decide on the next step in her treatment.

## 2019-09-05 NOTE — Progress Notes (Signed)
    CC: Jaundice, weakness/tired, increased bowel movements  Subjective: Patient is comfortable in bed this AM.  Continues to deny any pain.  She tolerated some clears yesterday.  Awaiting ERCP   Objective: Vital signs in last 24 hours: Temp:  [97.8 F (36.6 C)-98.6 F (37 C)] 97.8 F (36.6 C) (03/26 0528) Pulse Rate:  [62-67] 65 (03/26 0528) Resp:  [16-18] 16 (03/26 0528) BP: (117-137)/(50-64) 122/50 (03/26 0528) SpO2:  [96 %-97 %] 97 % (03/26 0528) Last BM Date: 09/03/19 360 p.o. 2009 1700 urine No BM recorded Afebrile vital signs are stable Lipase 351 LFTs remain elevated; bilirubin down to 14.3  Intake/Output from previous day: 03/25 0701 - 03/26 0700 In: 2328.8 [P.O.:360; I.V.:1868.8; IV Piggyback:100] Out: 1700 [Urine:1700] Intake/Output this shift: No intake/output data recorded.  General appearance: alert, cooperative, no distress. Resp: clear to auscultation bilaterally GI: soft, nontender, positive bowel sounds, positive BM yesterday.  Lab Results:  Recent Labs    09/04/19 0348 09/05/19 0401  WBC 5.6 3.9*  HGB 8.9* 7.8*  HCT 27.1* 23.8*  PLT 136* 129*    BMET Recent Labs    09/04/19 0348 09/05/19 0401  NA 138 138  K 3.3* 4.3  CL 111 117*  CO2 16* 14*  GLUCOSE 93 73  BUN 27* 24*  CREATININE 1.90* 1.69*  CALCIUM 8.7* 8.5*   PT/INR Recent Labs    09/04/19 0753  LABPROT 14.5  INR 1.1    Recent Labs  Lab 09/03/19 1130 09/04/19 0348 09/04/19 0753 09/05/19 0401  AST 169* 157* 182* 181*  ALT 209* 186* 199* 167*  ALKPHOS 410* 421* 457* 454*  BILITOT 16.1* 15.4* 16.1* 14.3*  PROT 6.9 6.0* 6.2* 5.4*  ALBUMIN 2.9* 2.7* 2.7* 2.3*     Lipase     Component Value Date/Time   LIPASE 351 (H) 09/05/2019 0401     Medications: . feeding supplement  1 Container Oral TID BM  . levETIRAcetam  500 mg Oral BID  . levothyroxine  100 mcg Oral Q0600  . melatonin  5 mg Oral QHS  . metoprolol tartrate  12.5 mg Oral BID  . sodium  bicarbonate  1,300 mg Oral BID   . sodium chloride 100 mL/hr at 09/05/19 0612  . cefTRIAXone (ROCEPHIN)  IV 1 g (09/04/19 1310)   Assessment/Plan Hx bladder cancer with urinary diversion/self catheter UTI - No growth on culture so far CKD stage IV -chronic hydronephrosis Check seizure disorder -Keppra Hypothyroid Anemia  H/H 9.5/28.4>> 8.9/27.1>> 7.8/23.(3/26) Thrombocytopenia platelets 153>> 136>> 129(3.26)  Cholelithiasis/choledocholithiasis Elevated LFTs - MRI 3/25: Choledocholithiasis with a cluster of small stones the distal common   bile duct measuring up to 0.3 cm in diameter.  The dilated CBD  measures 1.4 cm in diameter.  Cholelithiasis with distended gallbladder,  mild gallbladder wall thickening and pericholecystic fluid.  Several  clustered cystic lesions in the tip of the pancreatic tail.  Chronic bilateral  hydronephrosis and renal atrophy with some calyceal diverticuli.   Dextroconvex lumbar scoliosis.  - Lipase 264>>278>>351  - ERCP planned 2/26    FEN: IV fluids/n.p.o. ID: Rocephin 3/24 >> day 3 DVT: SCDs added Follow-up: TBD  Plan: Scheduled for ERCP today.  Recheck labs in AM, review findings ERCP, and discuss timing for cholecystectomy tomorrow.  I would make her n.p.o. after midnight tonight.      LOS: 2 days    Lineth Thielke 09/05/2019 Please see Amion

## 2019-09-05 NOTE — Interval H&P Note (Signed)
History and Physical Interval Note:  09/05/2019 1:03 PM  Glenda King  has presented today for surgery, with the diagnosis of jaundice, gallstones, biliary obstruction.  The various methods of treatment have been discussed with the patient and family. After consideration of risks, benefits and other options for treatment, the patient has consented to  Procedure(s): ENDOSCOPIC RETROGRADE CHOLANGIOPANCREATOGRAPHY (ERCP) (N/A) as a surgical intervention.  The patient's history has been reviewed, patient examined, no change in status, stable for surgery.  I have reviewed the patient's chart and labs.  Questions were answered to the patient's satisfaction, at the bedside today.     Scarlette Shorts

## 2019-09-05 NOTE — Progress Notes (Signed)
PROGRESS NOTE    Glenda King  EYC:[id number] DOB: 03/02/1929 DOA: 09/03/2019 PCP: Patient, No Pcp Per   Brief Narrative: 86 year old with past medical history significant for seizures, CKD stage IV, chronic hydronephrosis due to urinary diversion, hypothyroidism, history of bladder cancer with urinary diversion and self-catheterization who presents with concerns of changes in her urine color. Patient was evaluated by her PCP after seeing urine changes, she was referred to the ED for further evaluation.  She denies abdominal pain. Evaluation showed lipase of 264, ALT 209, AST 169, total bilirubin 16 creatinine 2.1 prior creatinine 1.9 2018. Right upper quadrant ultrasound showed distended gallbladder with gallbladder sludge and multiple gallstone.  Common bile duct dilated to 14 mm with intrahepatic and extra hepatic dilation.  Chronic hydronephrosis bilaterally from 2015. GI is recommending MRCP and depending on resort ERCP's.  Surgery has been consulted as well.   Assessment & Plan:   Principal Problem:   Choledocholithiasis Active Problems:   Hypothyroidism   Cholelithiases   Chronic kidney disease (CKD) stage G4/A1, severely decreased glomerular filtration rate (GFR) between 15-29 mL/min/1.73 square meter and albuminuria creatinine ratio less than 30 mg/g (HCC)   History of seizure   Jaundice   Hyperbilirubinemia   Biliary obstruction  1-Obstructive jaundice; Patient presented with transaminases, increased lipase.  Right upper quadrant ultrasound showed gallstones on an dilation of common bile duct. -MRCP; choledocholithiasis with cluster of a small stone in the distal CBD measuring individually up to about 1.3 cm in diameter.  The dilated CBD measure 1.4 cm.  Distended gallbladder with numerous gallstones measuring up to 0.7.  Dilation of the dorsal pancreatic duct.  Several clustered cystic lesions along the tip of the pancreatic tail, mild degree of progression of her  intervening years. 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  Status post sphincterotomy and biliary stent placement.  Cholelithiasis.  Dilated pancreatic duct. -Surgery recommending EUS, depending on results decide on next step of treatment  Patient seen in endo unit. She was difficult to aurous post procedure, got Hypertensive, tachypnea, oral airway was obtain, she received narcan, IV bolus.  -She is now able to follow commands, open eyes to command, she was able to move B/L lower extremities and upper extremities. Respiration is not labor.  -Metabolic acidosis on labs this am. Will start Bicarb gtt.  -tachycardia, repeat hb this afternoon. Continue with IV fluids.   2-Questionable UTI with urinary diversion requiring self-catheterization due to history of bladder cancer: Continue with IV ceftriaxone. Follow urine culture.  3-Hypokalemia: replete orally.   History of seizure: Continue with Keppra  Chronic kidney disease a stage IV with chronic hydronephrosis: No recent baseline to compare, but creatinine 2018 was at 1.8(careeverywhere) Monitor renal function Cr down to 1.9--from 2. Cr today at 1.69.    Hypothyroidism: Continue with Synthroid  Nutrition Problem: Increased nutrient needs Etiology: acute illness, chronic illness    Signs/Symptoms: estimated needs    Interventions: Refer to RD note for recommendations  Estimated body mass index is 15.74 kg/m as calculated from the following:   Height as of this encounter: 5' 6.5" (1.689 m).   Weight as of this encounter: 44.9 kg.   DVT prophylaxis: SCD Code Status: Full code Family Communication: Care discussed with patient directly Disposition Plan:  Patient is from: Home Anticipated d/c date: To be determine discharge To 3 days after work-up completed Barriers to d/c or necessity for  inpatient  status: Patient reports being in the hospital for further evaluation of obstructive jaundice.  She will need MRCP and depending on results may need ERCP  Consultants:   GI  Surgery   Procedures:   none  Antimicrobials:    Subjective: Patient evaluated in the endoscopy unit. She is sleepy but open eyes to command, follows command, she is able to moves all 4 extremities.  She denies pain   Objective: Vitals:   09/05/19 1610 09/05/19 1620 09/05/19 1630 09/05/19 1640  BP: 116/68 111/72 113/65 113/65  Pulse: (!) 104 (!) 101 (!) 104 (!) 109  Resp: 18 20 16 16   Temp:      TempSrc:      SpO2: 100%  100% 100%  Weight:      Height:        Intake/Output Summary (Last 24 hours) at 09/05/2019 1644 Last data filed at 09/05/2019 1505 Gross per 24 hour  Intake 3109.64 ml  Output 1710 ml  Net 1399.64 ml   Filed Weights   09/03/19 1138 09/03/19 2111  Weight: 45.7 kg 44.9 kg    Examination:  General exam: NAD icteric. Sleepy  Respiratory system: CTA Cardiovascular system: S 1, S 2 RRR, tachycardic Gastrointestinal system: BS present, soft,  nt Central nervous system: alert Extremities: symmetric power.    Data Reviewed: I have personally reviewed following labs and imaging studies  CBC: Recent Labs  Lab 09/03/19 1130 09/04/19 0348 09/05/19 0401  WBC 5.0 5.6 3.9*  NEUTROABS 4.2  --  2.9  HGB 9.5* 8.9* 7.8*  HCT 28.4* 27.1* 23.8*  MCV 95.3 97.1 97.1  PLT 153 136* 416*   Basic Metabolic Panel: Recent Labs  Lab 09/03/19 1130 09/04/19 0348 09/05/19 0401  NA 136 138 138  K 3.1* 3.3* 4.3  CL 108 111 117*  CO2 18* 16* 14*  GLUCOSE 147* 93 73  BUN 29* 27* 24*  CREATININE 2.11* 1.90* 1.69*  CALCIUM 8.9 8.7* 8.5*   GFR: Estimated Creatinine Clearance: 16.3 mL/min (A) (by C-G formula based on SCr of 1.69 mg/dL (H)). Liver Function Tests: Recent Labs  Lab 09/03/19 1130 09/04/19 0348 09/04/19 0753 09/05/19 0401  AST 169* 157* 182* 181*  ALT 209* 186* 199*  167*  ALKPHOS 410* 421* 457* 454*  BILITOT 16.1* 15.4* 16.1* 14.3*  PROT 6.9 6.0* 6.2* 5.4*  ALBUMIN 2.9* 2.7* 2.7* 2.3*   Recent Labs  Lab 09/03/19 1130 09/04/19 0753 09/05/19 0401  LIPASE 264* 278* 351*   No results for input(s): AMMONIA in the last 168 hours. Coagulation Profile: Recent Labs  Lab 09/04/19 0753  INR 1.1   Cardiac Enzymes: No results for input(s): CKTOTAL, CKMB, CKMBINDEX, TROPONINI in the last 168 hours. BNP (last 3 results) No results for input(s): PROBNP in the last 8760 hours. HbA1C: No results for input(s): HGBA1C in the last 72 hours. CBG: No results for input(s): GLUCAP in the last 168 hours. Lipid Profile: No results for input(s): CHOL, HDL, LDLCALC, TRIG, CHOLHDL, LDLDIRECT in the last 72 hours. Thyroid Function Tests: No results for input(s): TSH, T4TOTAL, FREET4, T3FREE, THYROIDAB in the last 72 hours. Anemia Panel: No results for input(s): VITAMINB12, FOLATE, FERRITIN, TIBC, IRON, RETICCTPCT in the last 72 hours. Sepsis Labs: No results for input(s): PROCALCITON, LATICACIDVEN in the last 168 hours.  Recent Results (from the past 240 hour(s))  Respiratory Panel by RT PCR (Flu A&B, Covid) - Nasopharyngeal Swab     Status: None   Collection Time: 09/03/19  1:59 PM  Specimen: Nasopharyngeal Swab  Result Value Ref Range Status   SARS Coronavirus 2 by RT PCR NEGATIVE NEGATIVE Final    Comment: (NOTE) SARS-CoV-2 target nucleic acids are NOT DETECTED. The SARS-CoV-2 RNA is generally detectable in upper respiratoy specimens during the acute phase of infection. The lowest concentration of SARS-CoV-2 viral copies this assay can detect is 131 copies/mL. A negative result does not preclude SARS-Cov-2 infection and should not be used as the sole basis for treatment or other patient management decisions. A negative result may occur with  improper specimen collection/handling, submission of specimen other than nasopharyngeal swab, presence of viral  mutation(s) within the areas targeted by this assay, and inadequate number of viral copies (<131 copies/mL). A negative result must be combined with clinical observations, patient history, and epidemiological information. The expected result is Negative. Fact Sheet for Patients:  PinkCheek.be Fact Sheet for Healthcare Providers:  GravelBags.it This test is not yet ap proved or cleared by the [place] FDA and  has been authorized for detection and/or diagnosis of SARS-CoV-2 by FDA under an Emergency Use Authorization (EUA). This EUA will remain  in effect (meaning this test can be used) for the duration of the COVID-19 declaration under Section 564(b)(1) of the Act, 21 U.S.C. section 360bbb-3(b)(1), unless the authorization is terminated or revoked sooner.    Influenza A by PCR NEGATIVE NEGATIVE Final   Influenza B by PCR NEGATIVE NEGATIVE Final    Comment: (NOTE) The Xpert Xpress SARS-CoV-2/FLU/RSV assay is intended as an aid in  the diagnosis of influenza from Nasopharyngeal swab specimens and  should not be used as a sole basis for treatment. Nasal washings and  aspirates are unacceptable for Xpert Xpress SARS-CoV-2/FLU/RSV  testing. Fact Sheet for Patients: PinkCheek.be Fact Sheet for Healthcare Providers: GravelBags.it This test is not yet approved or cleared by the [place] FDA and  has been authorized for detection and/or diagnosis of SARS-CoV-2 by  FDA under an Emergency Use Authorization (EUA). This EUA will remain  in effect (meaning this test can be used) for the duration of the  Covid-19 declaration under Section 564(b)(1) of the Act, 21  U.S.C. section 360bbb-3(b)(1), unless the authorization is  terminated or revoked. Performed at [facility], [address] [place], [place] [address]   Culture, Urine     Status: None    Collection Time: 09/04/19  8:26 AM   Specimen: Urine, Catheterized  Result Value Ref Range Status   Specimen Description   Final    URINE, CATHETERIZED Performed at [facility] [address], [place], [place] [address]    Special Requests   Final    NONE Performed at [facility], [place] [address], [place], [place] [address]    Culture   Final    NO GROWTH Performed at [facility], [place] [address], [place], [place] [address]    Report Status 09/05/2019 FINAL  Final         Radiology Studies: MR ABDOMEN MRCP WO CONTRAST  Result Date: 09/04/2019 CLINICAL DATA:  Jaundice. Gallstones. Biliary obstruction. Bladder cancer. Kidney disease. Chronic hydronephrosis. EXAM: MRI ABDOMEN WITHOUT CONTRAST  (INCLUDING MRCP) TECHNIQUE: Multiplanar multisequence MR imaging of the abdomen was performed. Heavily T2-weighted images of the biliary and pancreatic ducts were obtained, and three-dimensional MRCP images were rendered by post processing. COMPARISON:  Abdominal ultrasound 09/03/2019 FINDINGS: Despite efforts by the technologist and patient, motion artifact is present on today's exam and could not  be eliminated. This reduces exam sensitivity and specificity. Lower chest: Pectus excavatum. Hepatobiliary: Distended gallbladder with mild gallbladder wall thickening and numerous gallstones measuring up to about 0.7 cm in diameter. Prominent intrahepatic biliary dilatation. The common hepatic duct measures 1.8 cm in diameter and the common bile duct measures 1.4 cm in diameter. The dilated CBD extends down to what appears to be a cluster of at least 2 small stones in the distal CBD for example as shown on image 29/15 and likewise suggested on image 46/13. Image 24/3 likewise demonstrates 2-3 small low T2 signal intensities at the obstruction point favoring stones. These measure up to about 0.3 cm individually. Pancreas: There is dilation of the dorsal pancreatic  duct which is new compared to 11/08/2012. Clustered cystic lesions along the pancreatic tail, the largest 1.9 by 1.6 by 1.9 cm on image 13/3, previously about 1.2 by 1.0 by 1.1 cm on 11/08/2012. The other clustered cystic lesions along the tip of the pancreatic tail. Spleen:  Unremarkable Adrenals/Urinary Tract: Bilateral hydronephrosis and renal atrophy with some calyceal diverticula. This is a chronic appearance Stomach/Bowel: Unremarkable Vascular/Lymphatic:  Grossly unremarkable Other:  No supplemental non-categorized findings. Musculoskeletal: Dextroconvex lumbar scoliosis. IMPRESSION: 1. Choledocholithiasis with a cluster of small stones in the distal CBD measuring individually up to about 0.3 cm in diameter. The dilated CBD measures 1.4 cm in diameter. 2. Distended gallbladder with numerous gallstones measuring up to about 0.7 cm in diameter, and mild gallbladder wall thickening and pericholecystic fluid. 3. There is also dilation of the dorsal pancreatic duct which is new compared to 11/08/2012. 4. Several clustered cystic lesions along the tip of the pancreatic tail, the largest measuring 1.9 by 1.6 by 1.9 cm, previously 1.2 by 1.0 by 1.1 cm on 11/08/2012. Possibilities include postinflammatory cystic lesions or intraductal mucinous neoplasm. Given the size, patient age, and mild degree of progression over the intervening years, surveillance imaging by pancreatic protocol MRI in 2 years time is recommended. This recommendation follows ACR consensus guidelines: Management of Incidental Pancreatic Cysts: A White Paper of the ACR Incidental Findings Committee. J Am Coll Radiol 6237;62:831-517. 5. Chronic bilateral hydronephrosis and renal atrophy with some calyceal diverticula. 6. Dextroconvex lumbar scoliosis. 7. Pectus excavatum. Electronically Signed   By: Van Clines M.D.   On: 09/04/2019 16:01   MR 3D Recon At Scanner  Result Date: 09/04/2019 CLINICAL DATA:  Jaundice. Gallstones. Biliary  obstruction. Bladder cancer. Kidney disease. Chronic hydronephrosis. EXAM: MRI ABDOMEN WITHOUT CONTRAST  (INCLUDING MRCP) TECHNIQUE: Multiplanar multisequence MR imaging of the abdomen was performed. Heavily T2-weighted images of the biliary and pancreatic ducts were obtained, and three-dimensional MRCP images were rendered by post processing. COMPARISON:  Abdominal ultrasound 09/03/2019 FINDINGS: Despite efforts by the technologist and patient, motion artifact is present on today's exam and could not be eliminated. This reduces exam sensitivity and specificity. Lower chest: Pectus excavatum. Hepatobiliary: Distended gallbladder with mild gallbladder wall thickening and numerous gallstones measuring up to about 0.7 cm in diameter. Prominent intrahepatic biliary dilatation. The common hepatic duct measures 1.8 cm in diameter and the common bile duct measures 1.4 cm in diameter. The dilated CBD extends down to what appears to be a cluster of at least 2 small stones in the distal CBD for example as shown on image 29/15 and likewise suggested on image 46/13. Image 24/3 likewise demonstrates 2-3 small low T2 signal intensities at the obstruction point favoring stones. These measure up to about 0.3 cm individually. Pancreas: There is dilation of the dorsal  pancreatic duct which is new compared to 11/08/2012. Clustered cystic lesions along the pancreatic tail, the largest 1.9 by 1.6 by 1.9 cm on image 13/3, previously about 1.2 by 1.0 by 1.1 cm on 11/08/2012. The other clustered cystic lesions along the tip of the pancreatic tail. Spleen:  Unremarkable Adrenals/Urinary Tract: Bilateral hydronephrosis and renal atrophy with some calyceal diverticula. This is a chronic appearance Stomach/Bowel: Unremarkable Vascular/Lymphatic:  Grossly unremarkable Other:  No supplemental non-categorized findings. Musculoskeletal: Dextroconvex lumbar scoliosis. IMPRESSION: 1. Choledocholithiasis with a cluster of small stones in the distal  CBD measuring individually up to about 0.3 cm in diameter. The dilated CBD measures 1.4 cm in diameter. 2. Distended gallbladder with numerous gallstones measuring up to about 0.7 cm in diameter, and mild gallbladder wall thickening and pericholecystic fluid. 3. There is also dilation of the dorsal pancreatic duct which is new compared to 11/08/2012. 4. Several clustered cystic lesions along the tip of the pancreatic tail, the largest measuring 1.9 by 1.6 by 1.9 cm, previously 1.2 by 1.0 by 1.1 cm on 11/08/2012. Possibilities include postinflammatory cystic lesions or intraductal mucinous neoplasm. Given the size, patient age, and mild degree of progression over the intervening years, surveillance imaging by pancreatic protocol MRI in 2 years time is recommended. This recommendation follows ACR consensus guidelines: Management of Incidental Pancreatic Cysts: A White Paper of the ACR Incidental Findings Committee. J Am Coll Radiol 9147;82:956-213. 5. Chronic bilateral hydronephrosis and renal atrophy with some calyceal diverticula. 6. Dextroconvex lumbar scoliosis. 7. Pectus excavatum. Electronically Signed   By: Van Clines M.D.   On: 09/04/2019 16:01   DG ERCP BILIARY & PANCREATIC DUCTS  Result Date: 09/05/2019 CLINICAL DATA:  Biliary obstruction. EXAM: ERCP TECHNIQUE: Multiple spot images obtained with the fluoroscopic device and submitted for interpretation post-procedure. COMPARISON:  MRI/MRCP on 09/04/2019 FINDINGS: Imaging obtained with a C-arm during ERCP demonstrates cannulation of the pancreatic duct with contrast injection demonstrating diffuse dilatation of the visualized pancreatic duct. After cannulation of the common bile duct, there is evidence diffuse dilatation of the common bile duct and also intrahepatic bile ducts. No discrete filling defects are identified. There may be a component of distal stricture of the CBD. A balloon sweep maneuver was performed followed by placement of an  endoscopic biliary stent into the common bile duct. This appears well position. IMPRESSION: Diffuse biliary dilatation secondary to potentially a focal stricture of the distal CBD. An endoscopic biliary stent was placed in the common bile duct. These images were submitted for radiologic interpretation only. Please see the procedural report for the amount of contrast and the fluoroscopy time utilized. Electronically Signed   By: Aletta Edouard M.D.   On: 09/05/2019 16:12        Scheduled Meds: . [MAR Hold] feeding supplement  1 Container Oral TID BM  . [MAR Hold] indomethacin  100 mg Rectal Once  . [MAR Hold] levETIRAcetam  500 mg Oral BID  . [MAR Hold] levothyroxine  100 mcg Oral Q0600  . [MAR Hold] melatonin  5 mg Oral QHS  . [MAR Hold] metoprolol tartrate  12.5 mg Oral BID  . [MAR Hold] sodium bicarbonate  1,300 mg Oral BID   Continuous Infusions: . sodium chloride 100 mL/hr at 09/05/19 0612  . sodium chloride 500 mL (09/05/19 1243)  . [MAR Hold] cefTRIAXone (ROCEPHIN)  IV 1 g (09/04/19 1310)     LOS: 2 days    Time spent: 35 minutes,     Elmarie Shiley, MD [facility]  If 7PM-7AM, please contact night-coverage [email or web address]  09/05/2019, 4:44 PM

## 2019-09-05 NOTE — Progress Notes (Signed)
   09/05/19 2008  Vitals  Temp (!) 94.7 F (34.8 C) (MD notified/apply warm blankets per B Kyere)  Temp Source Rectal  BP 114/75  MAP (mmHg) 87  BP Location Left Arm  BP Method Automatic  Patient Position (if appropriate) Lying  Pulse Rate 100  Resp 18  Oxygen Therapy  SpO2 98 %  O2 Device Room Air  MEWS Score  MEWS Temp 2  MEWS Systolic 0  MEWS Pulse 0  MEWS RR 0  MEWS LOC 0  MEWS Score 2  MEWS Score Color Yellow  B Kyere notified of pt's temp.  Order to apply warm blankets on pt and recheck temp.

## 2019-09-06 DIAGNOSIS — D638 Anemia in other chronic diseases classified elsewhere: Secondary | ICD-10-CM

## 2019-09-06 LAB — COMPREHENSIVE METABOLIC PANEL
ALT: 152 U/L — ABNORMAL HIGH (ref 0–44)
AST: 156 U/L — ABNORMAL HIGH (ref 15–41)
Albumin: 2 g/dL — ABNORMAL LOW (ref 3.5–5.0)
Alkaline Phosphatase: 464 U/L — ABNORMAL HIGH (ref 38–126)
Anion gap: 9 (ref 5–15)
BUN: 30 mg/dL — ABNORMAL HIGH (ref 8–23)
CO2: 22 mmol/L (ref 22–32)
Calcium: 7.9 mg/dL — ABNORMAL LOW (ref 8.9–10.3)
Chloride: 110 mmol/L (ref 98–111)
Creatinine, Ser: 1.73 mg/dL — ABNORMAL HIGH (ref 0.44–1.00)
GFR calc Af Amer: 30 mL/min — ABNORMAL LOW (ref >60)
GFR calc non Af Amer: 26 mL/min — ABNORMAL LOW (ref >60)
Glucose, Bld: 282 mg/dL — ABNORMAL HIGH (ref 70–99)
Potassium: 3.9 mmol/L (ref 3.5–5.1)
Sodium: 141 mmol/L (ref 135–145)
Total Bilirubin: 10.4 mg/dL — ABNORMAL HIGH (ref 0.3–1.2)
Total Protein: 4.8 g/dL — ABNORMAL LOW (ref 6.5–8.1)

## 2019-09-06 LAB — CBC
HCT: 22.1 % — ABNORMAL LOW (ref 36.0–46.0)
Hemoglobin: 7.5 g/dL — ABNORMAL LOW (ref 12.0–15.0)
MCH: 31.9 pg (ref 26.0–34.0)
MCHC: 33.9 g/dL (ref 30.0–36.0)
MCV: 94 fL (ref 80.0–100.0)
Platelets: 124 10*3/uL — ABNORMAL LOW (ref 150–400)
RBC: 2.35 MIL/uL — ABNORMAL LOW (ref 3.87–5.11)
RDW: 17.2 % — ABNORMAL HIGH (ref 11.5–15.5)
WBC: 3.4 10*3/uL — ABNORMAL LOW (ref 4.0–10.5)
nRBC: 0 % (ref 0.0–0.2)

## 2019-09-06 LAB — LIPASE, BLOOD: Lipase: 455 U/L — ABNORMAL HIGH (ref 11–51)

## 2019-09-06 MED ORDER — VITAMIN B-12 1000 MCG PO TABS
1000.0000 ug | ORAL_TABLET | Freq: Every day | ORAL | Status: DC
  Administered 2019-09-06: 1000 ug via ORAL
  Filled 2019-09-06: qty 1

## 2019-09-06 MED ORDER — SODIUM BICARBONATE 8.4 % IV SOLN
INTRAVENOUS | Status: DC
  Filled 2019-09-06: qty 150

## 2019-09-06 MED ORDER — SODIUM CHLORIDE 0.9 % IV SOLN: INTRAVENOUS | Status: DC

## 2019-09-06 MED ORDER — CYANOCOBALAMIN 1000 MCG PO TABS: 1000.0000 ug | ORAL_TABLET | Freq: Every day | ORAL | 0 refills | Status: DC

## 2019-09-06 MED ORDER — FERROUS SULFATE 325 (65 FE) MG PO TABS
325.0000 mg | ORAL_TABLET | Freq: Every day | ORAL | Status: DC
  Administered 2019-09-06: 325 mg via ORAL
  Filled 2019-09-06: qty 1

## 2019-09-06 MED ORDER — FERROUS SULFATE 325 (65 FE) MG PO TABS: 325.0000 mg | ORAL_TABLET | Freq: Every day | ORAL | 3 refills | Status: DC

## 2019-09-06 NOTE — Plan of Care (Signed)
Discharge instructions reviewed with patient, questions answered, verbalized understanding.  Patient transported to main entrance via wheelchair to be taken home by brother.

## 2019-09-06 NOTE — Progress Notes (Signed)
1 Day Post-Op    CC: Jaundice, weakness/tired, increased bowel movements  Subjective: Comfortable, denies any pain, hungry   Objective: Vital signs in last 24 hours: Temp:  [94.7 F (34.8 C)-98.4 F (36.9 C)] 98.4 F (36.9 C) (03/27 0520) Pulse Rate:  [60-112] 63 (03/27 0520) Resp:  [13-20] 18 (03/27 0520) BP: (96-212)/(48-103) 102/48 (03/27 0520) SpO2:  [96 %-100 %] 97 % (03/27 0520) Last BM Date: 09/05/19   Intake/Output from previous day: 03/26 0701 - 03/27 0700 In: 3162.2 [P.O.:800; I.V.:1938.9; IV Piggyback:423.3] Out: 1110 [Urine:1100; Blood:10] Intake/Output this shift: No intake/output data recorded.  General appearance: alert, cooperative, no distress. Resp: clear to auscultation bilaterally GI: soft, nontender, positive bowel sounds, positive BM yesterday.  Lab Results:  Recent Labs    09/05/19 1731 09/06/19 0410  WBC 5.2 3.4*  HGB 9.6* 7.5*  HCT 29.1* 22.1*  PLT 142* 124*    BMET Recent Labs    09/05/19 0401 09/06/19 0410  NA 138 141  K 4.3 3.9  CL 117* 110  CO2 14* 22  GLUCOSE 73 282*  BUN 24* 30*  CREATININE 1.69* 1.73*  CALCIUM 8.5* 7.9*   PT/INR Recent Labs    09/04/19 0753  LABPROT 14.5  INR 1.1    Recent Labs  Lab 09/03/19 1130 09/04/19 0348 09/04/19 0753 09/05/19 0401 09/06/19 0410  AST 169* 157* 182* 181* 156*  ALT 209* 186* 199* 167* 152*  ALKPHOS 410* 421* 457* 454* 464*  BILITOT 16.1* 15.4* 16.1* 14.3* 10.4*  PROT 6.9 6.0* 6.2* 5.4* 4.8*  ALBUMIN 2.9* 2.7* 2.7* 2.3* 2.0*     Lipase     Component Value Date/Time   LIPASE 455 (H) 09/06/2019 0410     Medications: . feeding supplement  1 Container Oral TID BM  . indomethacin  100 mg Rectal Once  . levETIRAcetam  500 mg Oral BID  . levothyroxine  100 mcg Oral Q0600  . melatonin  5 mg Oral QHS  . metoprolol tartrate  12.5 mg Oral BID  . sodium bicarbonate  1,300 mg Oral BID   . cefTRIAXone (ROCEPHIN)  IV 1 g (09/04/19 1310)  .  sodium bicarbonate   infusion 1000 mL 100 mL/hr at 09/06/19 0407   Assessment/Plan Hx bladder cancer with urinary diversion/self catheter UTI - No growth on culture so far CKD stage IV -chronic hydronephrosis Check seizure disorder -Keppra Hypothyroid Anemia  H/H 9.5/28.4>> 8.9/27.1>> 7.8/23.(3/26) Thrombocytopenia platelets 153>> 136>> 129(3.26)  Cholelithiasis/choledocholithiasis Elevated LFTs - MRI 3/25: Choledocholithiasis with a cluster of small stones the distal common   bile duct measuring up to 0.3 cm in diameter.  The dilated CBD  measures 1.4 cm in diameter.  Cholelithiasis with distended gallbladder,  mild gallbladder wall thickening and pericholecystic fluid.  Several  clustered cystic lesions in the tip of the pancreatic tail.  Chronic bilateral  hydronephrosis and renal atrophy with some calyceal diverticuli.   Dextroconvex lumbar scoliosis.  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  Dilated pancreatic duct noted, cholelithiasis noted.  At this point recommend further evaluation of the biliary stricture for malignant etiology prior to considering cholecystectomy.   FEN: IV fluids/n.p.o. ID: Rocephin 3/24 >> day 3 DVT: SCDs added Follow-up: TBD  Plan: Recommend definitive diagnosis of biliary stricture prior to cholecystectomy.  Okay to advance diet from surgery standpoint, will defer to GI regarding further work-up.  LOS: 3 days    Clovis Riley MD 09/06/2019 Please see Amion

## 2019-09-06 NOTE — Discharge Instructions (Signed)
You need lab work next week (LFT and CBC, Bmet)  You need to follow up with DR Henrene Pastor for Endoscopy Ultrasound.

## 2019-09-06 NOTE — Discharge Summary (Addendum)
Physician Discharge Summary  Glenda King ZOX:[id number] DOB: 1928-11-19 DOA: 09/03/2019  PCP: Patient, No Pcp Per  Admit date: 09/03/2019 Discharge date: 09/06/2019  Admitted From: Home  Disposition:  Home   Recommendations for Outpatient Follow-up:  1. Follow up with PCP in 1-2 weeks 2. Please obtain BMP/CBC in one week 3. Needs repeat LFT and CBC next week 4. 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 Patient was evaluated by her PCP after seeing urine changes, she was referred to the ED for further evaluation.  She denies abdominal pain. Evaluation showed lipase of 264, ALT 209, AST 169, total bilirubin 16 creatinine 2.1 prior creatinine 1.9 2018. Right upper quadrant ultrasound showed distended gallbladder with gallbladder sludge and multiple gallstone.  Common bile duct dilated to 14 mm with intrahepatic and extra hepatic dilation.  Chronic hydronephrosis bilaterally from 2015. GI is recommending MRCP and depending on resort ERCP's.  Surgery has been consulted as well.  1-Obstructive jaundice; Patient presented with transaminases, increased lipase.  Right upper quadrant ultrasound showed gallstones on an dilation of common bile duct. -MRCP; choledocholithiasis with cluster of a small stone in the distal CBD measuring individually up to about 1.3 cm in diameter.  The dilated CBD measure 1.4 cm.  Distended gallbladder with numerous gallstones measuring up to 0.7.  Dilation of the dorsal pancreatic duct.  Several clustered cystic lesions along the tip of the pancreatic tail, mild  degree of progression of her intervening years. 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  Status post sphincterotomy and biliary stent placement.  Cholelithiasis.  Dilated pancreatic duct. -Surgery recommending EUS, depending on results decide on next step of treatment  Patient seen in endo unit 3-26. She was difficult to aurous post procedure, got Hypertensive, tachypnea, oral airway was obtain, she received narcan, IV bolus.  -She subsequently wake up,  follow commands, open eyes to command, she was able to move B/L lower extremities and upper extremities. Respiration is not labor. Remain stable overnight.  Patient feeling well, labs stable.  Plan to discharge home today. She needs to follow up with GI for EUS. No plan for cholecystectomy until further evaluation.  Need LFT and Hb next week.   -Metabolic acidosis; Treated with  Bicarb gtt.  -resolved. Resume home oral bicarb table.    2-Questionable UTI with urinary diversion requiring self-catheterization due to history of bladder cancer: Continue with IV ceftriaxone. Follow urine culture. No growth. No need for antibiotics at discharge.  Received 3 days IV antibiotics.   3-Hypokalemia: resolved.   History of seizure: Continue with Keppra  Chronic kidney disease a stage IV with chronic hydronephrosis: No recent baseline to compare, but creatinine 2018 was at 1.8(careeverywhere) Monitor renal function Cr down to 1.9--from 2. Cr today at 1.69.  --1.7 Stable.   Anemia; Suspect nutritional deficiency Started on iron and B 12 supplement.  She was asymptomatic.  Underweight; started on nutritional supplement.  Hypothyroidism: Continue with Synthroid.  Discharge Diagnoses:  Principal Problem:   Choledocholithiasis Active Problems:   Hypothyroidism   Cholelithiases   Chronic kidney  disease (CKD) stage  G4/A1, severely decreased glomerular filtration rate (GFR) between 15-29 mL/min/1.73 square meter and albuminuria creatinine ratio less than 30 mg/g (HCC)   History of seizure   Jaundice   Hyperbilirubinemia   Biliary obstruction    Discharge Instructions  Discharge Instructions    Diet - low sodium heart healthy   Complete by: As directed    Increase activity slowly   Complete by: As directed      Allergies as of 09/06/2019      Reactions   Amoxicillin    Pt unsure of reaction   Gadolinium Derivatives Other (See Comments)   Secondary to her stage IV kidney disease Secondary to her stage IV kidney disease   Pollen Extract Other (See Comments)   Sneezing      Medication List    STOP taking these medications   aspirin EC 81 MG tablet   co-enzyme Q-10 30 MG capsule     TAKE these medications   cyanocobalamin 1000 MCG tablet Take 1 tablet (1,000 mcg total) by mouth daily.   D2000 Ultra Strength 50 MCG (2000 UT) Caps Generic drug: Cholecalciferol Take 1 capsule by mouth in the morning and at bedtime.   ferrous sulfate 325 (65 FE) MG tablet Take 1 tablet (325 mg total) by mouth daily with breakfast. Start taking on: September 07, 2019   levETIRAcetam 500 MG tablet Commonly known as: KEPPRA Take 500 mg by mouth 2 (two) times daily.   levothyroxine 100 MCG tablet Commonly known as: SYNTHROID Take 100 mcg by mouth daily.   melatonin 5 MG Tabs Take 5 mg by mouth at bedtime.   metoprolol tartrate 25 MG tablet Commonly known as: LOPRESSOR Take 12.5 mg by mouth 2 (two) times daily.   sodium bicarbonate 650 MG tablet Take 1,300 mg by mouth 2 (two) times daily.      Follow-up Information    Irene Shipper, MD Follow up in 2 week(s).   Specialty: Gastroenterology Why: You need Endoscopy [place] procedure.   Contact information: [address] [address] [phone]          Allergies  Allergen Reactions  . Amoxicillin     Pt unsure of  reaction     . Gadolinium Derivatives Other (See Comments)    Secondary to her stage IV kidney disease Secondary to her stage IV kidney disease   . Pollen Extract Other (See Comments)    Sneezing    Consultations: GI Surgery   Procedures/Studies: DG Chest 2 View  Result Date: 09/03/2019 CLINICAL DATA:  Generalized weakness EXAM: CHEST - 2 VIEW COMPARISON:  March 06, 2012 FINDINGS: There is marked pectus excavatum. There is slight scarring in the right mid lung. There is no edema or airspace opacity. No focal nodular lesions evident. The heart size and pulmonary vascularity within normal limits. No adenopathy. IMPRESSION: Mild scarring right mid lung. Lungs elsewhere clear. Cardiac silhouette within normal limits. No adenopathy appreciable. There is marked pectus excavatum. Electronically Signed   By: Lowella Grip III M.D.   On: 09/03/2019 12:12   US Abdomen Complete  Result Date: 09/03/2019 CLINICAL DATA:  Right upper quadrant pain EXAM: ABDOMEN ULTRASOUND COMPLETE COMPARISON:  By report from 02/05/2014. FINDINGS: Gallbladder: Gallbladder is well distended with evidence of gallbladder sludge and multiple gallstones. No wall thickening or pericholecystic fluid is noted. Common bile duct: Diameter: 14.9 mm with intrahepatic dilatation Liver: No focal lesion identified. Within normal limits in parenchymal echogenicity. Portal vein is  patent on color Doppler imaging with normal direction of blood flow towards the liver. Significant intrahepatic ductal dilatation is noted. IVC: No abnormality visualized. Pancreas: Visualized portion unremarkable. Spleen: Size and appearance within normal limits. Right Kidney: Length: 7.9 cm. Chronic hydronephrosis is noted stable by report from a prior CT of 2015. Left Kidney: Length: 9.4 cm. Chronic hydronephrosis is noted stable from a prior CT examination. Abdominal aorta: No aneurysm visualized. Other findings: None. IMPRESSION: Cholelithiasis and  gallbladder sludge with significant intra and extrahepatic biliary ductal dilatation. Correlation with laboratory values is recommended. These changes are likely related to a distal common bile duct stone. Further workup is recommended. Chronic hydronephrosis bilaterally stable from a prior CT of 2015 consistent with the patient's known urinary diversion. Electronically Signed   By: Inez Catalina M.D.   On: 09/03/2019 12:37   MR ABDOMEN MRCP WO CONTRAST  Result Date: 09/04/2019 CLINICAL DATA:  Jaundice. Gallstones. Biliary obstruction. Bladder cancer. Kidney disease. Chronic hydronephrosis. EXAM: MRI ABDOMEN WITHOUT CONTRAST  (INCLUDING MRCP) TECHNIQUE: Multiplanar multisequence MR imaging of the abdomen was performed. Heavily T2-weighted images of the biliary and pancreatic ducts were obtained, and three-dimensional MRCP images were rendered by post processing. COMPARISON:  Abdominal ultrasound 09/03/2019 FINDINGS: Despite efforts by the technologist and patient, motion artifact is present on today's exam and could not be eliminated. This reduces exam sensitivity and specificity. Lower chest: Pectus excavatum. Hepatobiliary: Distended gallbladder with mild gallbladder wall thickening and numerous gallstones measuring up to about 0.7 cm in diameter. Prominent intrahepatic biliary dilatation. The common hepatic duct measures 1.8 cm in diameter and the common bile duct measures 1.4 cm in diameter. The dilated CBD extends down to what appears to be a cluster of at least 2 small stones in the distal CBD for example as shown on image 29/15 and likewise suggested on image 46/13. Image 24/3 likewise demonstrates 2-3 small low T2 signal intensities at the obstruction point favoring stones. These measure up to about 0.3 cm individually. Pancreas: There is dilation of the dorsal pancreatic duct which is new compared to 11/08/2012. Clustered cystic lesions along the pancreatic tail, the largest 1.9 by 1.6 by 1.9 cm on image  13/3, previously about 1.2 by 1.0 by 1.1 cm on 11/08/2012. The other clustered cystic lesions along the tip of the pancreatic tail. Spleen:  Unremarkable Adrenals/Urinary Tract: Bilateral hydronephrosis and renal atrophy with some calyceal diverticula. This is a chronic appearance Stomach/Bowel: Unremarkable Vascular/Lymphatic:  Grossly unremarkable Other:  No supplemental non-categorized findings. Musculoskeletal: Dextroconvex lumbar scoliosis. IMPRESSION: 1. Choledocholithiasis with a cluster of small stones in the distal CBD measuring individually up to about 0.3 cm in diameter. The dilated CBD measures 1.4 cm in diameter. 2. Distended gallbladder with numerous gallstones measuring up to about 0.7 cm in diameter, and mild gallbladder wall thickening and pericholecystic fluid. 3. There is also dilation of the dorsal pancreatic duct which is new compared to 11/08/2012. 4. Several clustered cystic lesions along the tip of the pancreatic tail, the largest measuring 1.9 by 1.6 by 1.9 cm, previously 1.2 by 1.0 by 1.1 cm on 11/08/2012. Possibilities include postinflammatory cystic lesions or intraductal mucinous neoplasm. Given the size, patient age, and mild degree of progression over the intervening years, surveillance imaging by pancreatic protocol MRI in 2 years time is recommended. This recommendation follows ACR consensus guidelines: Management of Incidental Pancreatic Cysts: A White Paper of the ACR Incidental Findings Committee. J Am Coll Radiol 7867;67:209-470. 5. Chronic bilateral hydronephrosis and renal atrophy with some calyceal  diverticula. 6. Dextroconvex lumbar scoliosis. 7. Pectus excavatum. Electronically Signed   By: Van Clines M.D.   On: 09/04/2019 16:01   MR 3D Recon At Scanner  Result Date: 09/04/2019 CLINICAL DATA:  Jaundice. Gallstones. Biliary obstruction. Bladder cancer. Kidney disease. Chronic hydronephrosis. EXAM: MRI ABDOMEN WITHOUT CONTRAST  (INCLUDING MRCP) TECHNIQUE:  Multiplanar multisequence MR imaging of the abdomen was performed. Heavily T2-weighted images of the biliary and pancreatic ducts were obtained, and three-dimensional MRCP images were rendered by post processing. COMPARISON:  Abdominal ultrasound 09/03/2019 FINDINGS: Despite efforts by the technologist and patient, motion artifact is present on today's exam and could not be eliminated. This reduces exam sensitivity and specificity. Lower chest: Pectus excavatum. Hepatobiliary: Distended gallbladder with mild gallbladder wall thickening and numerous gallstones measuring up to about 0.7 cm in diameter. Prominent intrahepatic biliary dilatation. The common hepatic duct measures 1.8 cm in diameter and the common bile duct measures 1.4 cm in diameter. The dilated CBD extends down to what appears to be a cluster of at least 2 small stones in the distal CBD for example as shown on image 29/15 and likewise suggested on image 46/13. Image 24/3 likewise demonstrates 2-3 small low T2 signal intensities at the obstruction point favoring stones. These measure up to about 0.3 cm individually. Pancreas: There is dilation of the dorsal pancreatic duct which is new compared to 11/08/2012. Clustered cystic lesions along the pancreatic tail, the largest 1.9 by 1.6 by 1.9 cm on image 13/3, previously about 1.2 by 1.0 by 1.1 cm on 11/08/2012. The other clustered cystic lesions along the tip of the pancreatic tail. Spleen:  Unremarkable Adrenals/Urinary Tract: Bilateral hydronephrosis and renal atrophy with some calyceal diverticula. This is a chronic appearance Stomach/Bowel: Unremarkable Vascular/Lymphatic:  Grossly unremarkable Other:  No supplemental non-categorized findings. Musculoskeletal: Dextroconvex lumbar scoliosis. IMPRESSION: 1. Choledocholithiasis with a cluster of small stones in the distal CBD measuring individually up to about 0.3 cm in diameter. The dilated CBD measures 1.4 cm in diameter. 2. Distended gallbladder with  numerous gallstones measuring up to about 0.7 cm in diameter, and mild gallbladder wall thickening and pericholecystic fluid. 3. There is also dilation of the dorsal pancreatic duct which is new compared to 11/08/2012. 4. Several clustered cystic lesions along the tip of the pancreatic tail, the largest measuring 1.9 by 1.6 by 1.9 cm, previously 1.2 by 1.0 by 1.1 cm on 11/08/2012. Possibilities include postinflammatory cystic lesions or intraductal mucinous neoplasm. Given the size, patient age, and mild degree of progression over the intervening years, surveillance imaging by pancreatic protocol MRI in 2 years time is recommended. This recommendation follows ACR consensus guidelines: Management of Incidental Pancreatic Cysts: A White Paper of the ACR Incidental Findings Committee. J Am Coll Radiol 3748;27:078-675. 5. Chronic bilateral hydronephrosis and renal atrophy with some calyceal diverticula. 6. Dextroconvex lumbar scoliosis. 7. Pectus excavatum. Electronically Signed   By: Van Clines M.D.   On: 09/04/2019 16:01   DG ERCP BILIARY & PANCREATIC DUCTS  Result Date: 09/05/2019 CLINICAL DATA:  Biliary obstruction. EXAM: ERCP TECHNIQUE: Multiple spot images obtained with the fluoroscopic device and submitted for interpretation post-procedure. COMPARISON:  MRI/MRCP on 09/04/2019 FINDINGS: Imaging obtained with a C-arm during ERCP demonstrates cannulation of the pancreatic duct with contrast injection demonstrating diffuse dilatation of the visualized pancreatic duct. After cannulation of the common bile duct, there is evidence diffuse dilatation of the common bile duct and also intrahepatic bile ducts. No discrete filling defects are identified. There may be a component of distal  stricture of the CBD. A balloon sweep maneuver was performed followed by placement of an endoscopic biliary stent into the common bile duct. This appears well position. IMPRESSION: Diffuse biliary dilatation secondary to  potentially a focal stricture of the distal CBD. An endoscopic biliary stent was placed in the common bile duct. These images were submitted for radiologic interpretation only. Please see the procedural report for the amount of contrast and the fluoroscopy time utilized. Electronically Signed   By: Aletta Edouard M.D.   On: 09/05/2019 16:12    (Echo, Carotid, EGD, Colonoscopy, ERCP)    Subjective:   Discharge Exam: Vitals:   09/06/19 0155 09/06/19 0520  BP: 108/62 (!) 102/48  Pulse: 64 63  Resp: 18 18  Temp: 98 F (36.7 C) 98.4 F (36.9 C)  SpO2: 98% 97%     General: Pt is alert, awake, not in acute distress Cardiovascular: RRR, S1/S2 +, no rubs, no gallops Respiratory: CTA bilaterally, no wheezing, no rhonchi Abdominal: Soft, NT, ND, bowel sounds + Extremities: no edema, no cyanosis    The results of significant diagnostics from this hospitalization (including imaging, microbiology, ancillary and laboratory) are listed below for reference.     Microbiology: Recent Results (from the past 240 hour(s))  Respiratory Panel by RT PCR (Flu A&B, Covid) - Nasopharyngeal Swab     Status: None   Collection Time: 09/03/19  1:59 PM   Specimen: Nasopharyngeal Swab  Result Value Ref Range Status   SARS Coronavirus 2 by RT PCR NEGATIVE NEGATIVE Final    Comment: (NOTE) SARS-CoV-2 target nucleic acids are NOT DETECTED. The SARS-CoV-2 RNA is generally detectable in upper respiratoy specimens during the acute phase of infection. The lowest concentration of SARS-CoV-2 viral copies this assay can detect is 131 copies/mL. A negative result does not preclude SARS-Cov-2 infection and should not be used as the sole basis for treatment or other patient management decisions. A negative result may occur with  improper specimen collection/handling, submission of specimen other than nasopharyngeal swab, presence of viral mutation(s) within the areas targeted by this assay, and inadequate number  of viral copies (<131 copies/mL). A negative result must be combined with clinical observations, patient history, and epidemiological information. The expected result is Negative. Fact Sheet for Patients:  PinkCheek.be Fact Sheet for Healthcare Providers:  GravelBags.it This test is not yet ap proved or cleared by the [place] FDA and  has been authorized for detection and/or diagnosis of SARS-CoV-2 by FDA under an Emergency Use Authorization (EUA). This EUA will remain  in effect (meaning this test can be used) for the duration of the COVID-19 declaration under Section 564(b)(1) of the Act, 21 U.S.C. section 360bbb-3(b)(1), unless the authorization is terminated or revoked sooner.    Influenza A by PCR NEGATIVE NEGATIVE Final   Influenza B by PCR NEGATIVE NEGATIVE Final    Comment: (NOTE) The Xpert Xpress SARS-CoV-2/FLU/RSV assay is intended as an aid in  the diagnosis of influenza from Nasopharyngeal swab specimens and  should not be used as a sole basis for treatment. Nasal washings and  aspirates are unacceptable for Xpert Xpress SARS-CoV-2/FLU/RSV  testing. Fact Sheet for Patients: PinkCheek.be Fact Sheet for Healthcare Providers: GravelBags.it This test is not yet approved or cleared by the [place] FDA and  has been authorized for detection and/or diagnosis of SARS-CoV-2 by  FDA under an Emergency Use Authorization (EUA). This EUA will remain  in effect (meaning this test can be used) for the duration of the  Covid-19 declaration under Section 564(b)(1) of the Act, 21  U.S.C. section 360bbb-3(b)(1), unless the authorization is  terminated or revoked. Performed at [facility], [address] [place], [place] [address]   Culture, Urine     Status: None   Collection Time: 09/04/19  8:26 AM   Specimen: Urine, Catheterized  Result Value  Ref Range Status   Specimen Description   Final    URINE, CATHETERIZED Performed at [facility] [address], [place], [place] [address]    Special Requests   Final    NONE Performed at [facility], [place] [address], [place], [place] [address]    Culture   Final    NO GROWTH Performed at [facility], [place] [address], [place], [place] [address]    Report Status 09/05/2019 FINAL  Final     Labs: BNP (last 3 results) No results for input(s): BNP in the last 8760 hours. Basic Metabolic Panel: Recent Labs  Lab 09/03/19 1130 09/04/19 0348 09/05/19 0401 09/06/19 0410  NA 136 138 138 141  K 3.1* 3.3* 4.3 3.9  CL 108 111 117* 110  CO2 18* 16* 14* 22  GLUCOSE 147* 93 73 282*  BUN 29* 27* 24* 30*  CREATININE 2.11* 1.90* 1.69* 1.73*  CALCIUM 8.9 8.7* 8.5* 7.9*   Liver Function Tests: Recent Labs  Lab 09/03/19 1130 09/04/19 0348 09/04/19 0753 09/05/19 0401 09/06/19 0410  AST 169* 157* 182* 181* 156*  ALT 209* 186* 199* 167* 152*  ALKPHOS 410* 421* 457* 454* 464*  BILITOT 16.1* 15.4* 16.1* 14.3* 10.4*  PROT 6.9 6.0* 6.2* 5.4* 4.8*  ALBUMIN 2.9* 2.7* 2.7* 2.3* 2.0*   Recent Labs  Lab 09/03/19 1130 09/04/19 0753 09/05/19 0401 09/06/19 0410  LIPASE 264* 278* 351* 455*   No results for input(s): AMMONIA in the last 168 hours. CBC: Recent Labs  Lab 09/03/19 1130 09/04/19 0348 09/05/19 0401 09/05/19 1731 09/06/19 0410  WBC 5.0 5.6 3.9* 5.2 3.4*  NEUTROABS 4.2  --  2.9  --   --   HGB 9.5* 8.9* 7.8* 9.6* 7.5*  HCT 28.4* 27.1* 23.8* 29.1* 22.1*  MCV 95.3 97.1 97.1 98.0 94.0  PLT 153 136* 129* 142* 124*   Cardiac Enzymes: No results for input(s): CKTOTAL, CKMB, CKMBINDEX, TROPONINI in the last 168 hours. BNP: Invalid input(s): POCBNP CBG: Recent Labs  Lab 09/05/19 1659  GLUCAP 85   D-Dimer No results for input(s): DDIMER in the last 72 hours. Hgb A1c No results for input(s): HGBA1C in the last 72  hours. Lipid Profile No results for input(s): CHOL, HDL, LDLCALC, TRIG, CHOLHDL, LDLDIRECT in the last 72 hours. Thyroid function studies No results for input(s): TSH, T4TOTAL, T3FREE, THYROIDAB in the last 72 hours.  Invalid input(s): FREET3 Anemia work up No results for input(s): VITAMINB12, FOLATE, FERRITIN, TIBC, IRON, RETICCTPCT in the last 72 hours. Urinalysis    Component Value Date/Time   COLORURINE AMBER (A) 09/03/2019 1226   APPEARANCEUR CLOUDY (A) 09/03/2019 1226   LABSPEC 1.010 09/03/2019 1226   PHURINE 7.0 09/03/2019 1226   GLUCOSEU NEGATIVE 09/03/2019 1226   HGBUR SMALL (A) 09/03/2019 1226   BILIRUBINUR LARGE (A) 09/03/2019 1226   KETONESUR NEGATIVE 09/03/2019 1226   PROTEINUR 30 (A) 09/03/2019 1226   NITRITE NEGATIVE 09/03/2019 1226   LEUKOCYTESUR LARGE (A) 09/03/2019 1226   Sepsis Labs Invalid input(s): PROCALCITONIN,  WBC,  LACTICIDVEN Microbiology Recent Results (from the past 240 hour(s))  Respiratory Panel  by RT PCR (Flu A&B, Covid) - Nasopharyngeal Swab     Status: None   Collection Time: 09/03/19  1:59 PM   Specimen: Nasopharyngeal Swab  Result Value Ref Range Status   SARS Coronavirus 2 by RT PCR NEGATIVE NEGATIVE Final    Comment: (NOTE) SARS-CoV-2 target nucleic acids are NOT DETECTED. The SARS-CoV-2 RNA is generally detectable in upper respiratoy specimens during the acute phase of infection. The lowest concentration of SARS-CoV-2 viral copies this assay can detect is 131 copies/mL. A negative result does not preclude SARS-Cov-2 infection and should not be used as the sole basis for treatment or other patient management decisions. A negative result may occur with  improper specimen collection/handling, submission of specimen other than nasopharyngeal swab, presence of viral mutation(s) within the areas targeted by this assay, and inadequate number of viral copies (<131 copies/mL). A negative result must be combined with clinical observations,  patient history, and epidemiological information. The expected result is Negative. Fact Sheet for Patients:  PinkCheek.be Fact Sheet for Healthcare Providers:  GravelBags.it This test is not yet ap proved or cleared by the [place] FDA and  has been authorized for detection and/or diagnosis of SARS-CoV-2 by FDA under an Emergency Use Authorization (EUA). This EUA will remain  in effect (meaning this test can be used) for the duration of the COVID-19 declaration under Section 564(b)(1) of the Act, 21 U.S.C. section 360bbb-3(b)(1), unless the authorization is terminated or revoked sooner.    Influenza A by PCR NEGATIVE NEGATIVE Final   Influenza B by PCR NEGATIVE NEGATIVE Final    Comment: (NOTE) The Xpert Xpress SARS-CoV-2/FLU/RSV assay is intended as an aid in  the diagnosis of influenza from Nasopharyngeal swab specimens and  should not be used as a sole basis for treatment. Nasal washings and  aspirates are unacceptable for Xpert Xpress SARS-CoV-2/FLU/RSV  testing. Fact Sheet for Patients: PinkCheek.be Fact Sheet for Healthcare Providers: GravelBags.it This test is not yet approved or cleared by the [place] FDA and  has been authorized for detection and/or diagnosis of SARS-CoV-2 by  FDA under an Emergency Use Authorization (EUA). This EUA will remain  in effect (meaning this test can be used) for the duration of the  Covid-19 declaration under Section 564(b)(1) of the Act, 21  U.S.C. section 360bbb-3(b)(1), unless the authorization is  terminated or revoked. Performed at [facility], [address] [place], [place] [address]   Culture, Urine     Status: None   Collection Time: 09/04/19  8:26 AM   Specimen: Urine, Catheterized  Result Value Ref Range Status   Specimen Description   Final    URINE, CATHETERIZED Performed at [facility] [address], [place], [place] [address]    Special Requests   Final    NONE Performed at [facility], [place] [address] [place], [place] [address]    Culture   Final    NO GROWTH Performed at [facility], [place] [address], [place], [place] [address]    Report Status 09/05/2019 FINAL  Final     Time coordinating discharge: 40 minutes  SIGNED:   Elmarie Shiley, MD  [facility]

## 2019-09-06 NOTE — Progress Notes (Addendum)
[facility] Progress Note  CC:  Gallstones, dilated common bile duct, obstructive jaundice with acute cholestasis   Subjective: She feels well this morning. Sitting up in chair. No N/V. No abdominal pain. Tolerated clear liquid diet post ERCP without difficulty. She is hungry and wishes to eat solid food. She passed a formed brown BM yesterday. Continues to self cath urinary orange colored urine. No other complaints today.   Objective:   S/P ERCP 3/26: 1. 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 No obvious common duct stones. Status post sphincterotomy and biliary stent placement 2. Cholelithiasis 3. Dilated pancreatic duct  Vital signs in last 24 hours: Temp:  [94.7 F (34.8 C)-98.4 F (36.9 C)] 98.4 F (36.9 C) (03/27 0520) Pulse Rate:  [60-112] 63 (03/27 0520) Resp:  [13-20] 18 (03/27 0520) BP: (96-212)/(48-103) 102/48 (03/27 0520) SpO2:  [96 %-100 %] 97 % (03/27 0520) Last BM Date: 09/05/19 General:  Alert, cachectic appearing female in NAD.  Heart: RRR, systolic murmur.  Pulm:  Breath sounds clear throughout.  Abdomen:  Soft, nondistended. Nontender. RLQ urinary pouch stoma intact. + BS x 4 quads.  Extremities:  Without edema. Neurologic:  Alert and  oriented x4;  grossly normal neurologically. Psych:  Alert and cooperative. Normal mood and affect.  Intake/Output from previous day: 03/26 0701 - 03/27 0700 In: 2362.2 [I.V.:1938.9; IV Piggyback:423.3] Out: 1110 [Urine:1100; Blood:10] Intake/Output this shift: Total I/O In: 989.8 [I.V.:989.8] Out: 1100 [Urine:1100]  Lab Results: Recent Labs    09/05/19 0401 09/05/19 1731 09/06/19 0410  WBC 3.9* 5.2 3.4*  HGB 7.8* 9.6* 7.5*  HCT 23.8* 29.1* 22.1*  PLT 129* 142* 124*   BMET Recent Labs    09/04/19 0348 09/05/19 0401 09/06/19 0410  NA 138 138 141  K 3.3* 4.3 3.9  CL 111 117* 110  CO2 16* 14* 22  GLUCOSE 93 73  282*  BUN 27* 24* 30*  CREATININE 1.90* 1.69* 1.73*  CALCIUM 8.7* 8.5* 7.9*   LFT Recent Labs    09/05/19 0401 09/05/19 0401 09/06/19 0410  PROT 5.4*   < > 4.8*  ALBUMIN 2.3*   < > 2.0*  AST 181*   < > 156*  ALT 167*   < > 152*  ALKPHOS 454*   < > 464*  BILITOT 14.3*   < > 10.4*  BILIDIR 9.2*  --   --   IBILI 5.1*  --   --    < > = values in this interval not displayed.   PT/INR Recent Labs    09/04/19 0753  LABPROT 14.5  INR 1.1   Hepatitis Panel Recent Labs    09/03/19 1130  HEPBSAG NON REACTIVE  HCVAB NON REACTIVE  HEPAIGM NON REACTIVE  HEPBIGM NON REACTIVE    MR ABDOMEN MRCP WO CONTRAST  Result Date: 09/04/2019 CLINICAL DATA:  Jaundice. Gallstones. Biliary obstruction. Bladder cancer. Kidney disease. Chronic hydronephrosis. EXAM: MRI ABDOMEN WITHOUT CONTRAST  (INCLUDING MRCP) TECHNIQUE: Multiplanar multisequence MR imaging of the abdomen was performed. Heavily T2-weighted images of the biliary and pancreatic ducts were obtained, and three-dimensional MRCP images were rendered by post processing. COMPARISON:  Abdominal ultrasound 09/03/2019 FINDINGS: Despite efforts by the technologist and patient, motion artifact is present on today's exam and could not be eliminated. This reduces exam sensitivity and specificity. Lower chest: Pectus excavatum. Hepatobiliary: Distended gallbladder with mild gallbladder wall thickening and numerous gallstones measuring up to about 0.7 cm in diameter. Prominent intrahepatic  biliary dilatation. The common hepatic duct measures 1.8 cm in diameter and the common bile duct measures 1.4 cm in diameter. The dilated CBD extends down to what appears to be a cluster of at least 2 small stones in the distal CBD for example as shown on image 29/15 and likewise suggested on image 46/13. Image 24/3 likewise demonstrates 2-3 small low T2 signal intensities at the obstruction point favoring stones. These measure up to about 0.3 cm individually. Pancreas:  There is dilation of the dorsal pancreatic duct which is new compared to 11/08/2012. Clustered cystic lesions along the pancreatic tail, the largest 1.9 by 1.6 by 1.9 cm on image 13/3, previously about 1.2 by 1.0 by 1.1 cm on 11/08/2012. The other clustered cystic lesions along the tip of the pancreatic tail. Spleen:  Unremarkable Adrenals/Urinary Tract: Bilateral hydronephrosis and renal atrophy with some calyceal diverticula. This is a chronic appearance Stomach/Bowel: Unremarkable Vascular/Lymphatic:  Grossly unremarkable Other:  No supplemental non-categorized findings. Musculoskeletal: Dextroconvex lumbar scoliosis. IMPRESSION: 1. Choledocholithiasis with a cluster of small stones in the distal CBD measuring individually up to about 0.3 cm in diameter. The dilated CBD measures 1.4 cm in diameter. 2. Distended gallbladder with numerous gallstones measuring up to about 0.7 cm in diameter, and mild gallbladder wall thickening and pericholecystic fluid. 3. There is also dilation of the dorsal pancreatic duct which is new compared to 11/08/2012. 4. Several clustered cystic lesions along the tip of the pancreatic tail, the largest measuring 1.9 by 1.6 by 1.9 cm, previously 1.2 by 1.0 by 1.1 cm on 11/08/2012. Possibilities include postinflammatory cystic lesions or intraductal mucinous neoplasm. Given the size, patient age, and mild degree of progression over the intervening years, surveillance imaging by pancreatic protocol MRI in 2 years time is recommended. This recommendation follows ACR consensus guidelines: Management of Incidental Pancreatic Cysts: A White Paper of the ACR Incidental Findings Committee. J Am Coll Radiol 3474;25:956-387. 5. Chronic bilateral hydronephrosis and renal atrophy with some calyceal diverticula. 6. Dextroconvex lumbar scoliosis. 7. Pectus excavatum. Electronically Signed   By: Van Clines M.D.   On: 09/04/2019 16:01   MR 3D Recon At Scanner  Result Date: 09/04/2019 CLINICAL  DATA:  Jaundice. Gallstones. Biliary obstruction. Bladder cancer. Kidney disease. Chronic hydronephrosis. EXAM: MRI ABDOMEN WITHOUT CONTRAST  (INCLUDING MRCP) TECHNIQUE: Multiplanar multisequence MR imaging of the abdomen was performed. Heavily T2-weighted images of the biliary and pancreatic ducts were obtained, and three-dimensional MRCP images were rendered by post processing. COMPARISON:  Abdominal ultrasound 09/03/2019 FINDINGS: Despite efforts by the technologist and patient, motion artifact is present on today's exam and could not be eliminated. This reduces exam sensitivity and specificity. Lower chest: Pectus excavatum. Hepatobiliary: Distended gallbladder with mild gallbladder wall thickening and numerous gallstones measuring up to about 0.7 cm in diameter. Prominent intrahepatic biliary dilatation. The common hepatic duct measures 1.8 cm in diameter and the common bile duct measures 1.4 cm in diameter. The dilated CBD extends down to what appears to be a cluster of at least 2 small stones in the distal CBD for example as shown on image 29/15 and likewise suggested on image 46/13. Image 24/3 likewise demonstrates 2-3 small low T2 signal intensities at the obstruction point favoring stones. These measure up to about 0.3 cm individually. Pancreas: There is dilation of the dorsal pancreatic duct which is new compared to 11/08/2012. Clustered cystic lesions along the pancreatic tail, the largest 1.9 by 1.6 by 1.9 cm on image 13/3, previously about 1.2 by 1.0 by 1.1  cm on 11/08/2012. The other clustered cystic lesions along the tip of the pancreatic tail. Spleen:  Unremarkable Adrenals/Urinary Tract: Bilateral hydronephrosis and renal atrophy with some calyceal diverticula. This is a chronic appearance Stomach/Bowel: Unremarkable Vascular/Lymphatic:  Grossly unremarkable Other:  No supplemental non-categorized findings. Musculoskeletal: Dextroconvex lumbar scoliosis. IMPRESSION: 1. Choledocholithiasis with a  cluster of small stones in the distal CBD measuring individually up to about 0.3 cm in diameter. The dilated CBD measures 1.4 cm in diameter. 2. Distended gallbladder with numerous gallstones measuring up to about 0.7 cm in diameter, and mild gallbladder wall thickening and pericholecystic fluid. 3. There is also dilation of the dorsal pancreatic duct which is new compared to 11/08/2012. 4. Several clustered cystic lesions along the tip of the pancreatic tail, the largest measuring 1.9 by 1.6 by 1.9 cm, previously 1.2 by 1.0 by 1.1 cm on 11/08/2012. Possibilities include postinflammatory cystic lesions or intraductal mucinous neoplasm. Given the size, patient age, and mild degree of progression over the intervening years, surveillance imaging by pancreatic protocol MRI in 2 years time is recommended. This recommendation follows ACR consensus guidelines: Management of Incidental Pancreatic Cysts: A White Paper of the ACR Incidental Findings Committee. J Am Coll Radiol 2878;67:672-094. 5. Chronic bilateral hydronephrosis and renal atrophy with some calyceal diverticula. 6. Dextroconvex lumbar scoliosis. 7. Pectus excavatum. Electronically Signed   By: Van Clines M.D.   On: 09/04/2019 16:01   DG ERCP BILIARY & PANCREATIC DUCTS  Result Date: 09/05/2019 CLINICAL DATA:  Biliary obstruction. EXAM: ERCP TECHNIQUE: Multiple spot images obtained with the fluoroscopic device and submitted for interpretation post-procedure. COMPARISON:  MRI/MRCP on 09/04/2019 FINDINGS: Imaging obtained with a C-arm during ERCP demonstrates cannulation of the pancreatic duct with contrast injection demonstrating diffuse dilatation of the visualized pancreatic duct. After cannulation of the common bile duct, there is evidence diffuse dilatation of the common bile duct and also intrahepatic bile ducts. No discrete filling defects are identified. There may be a component of distal stricture of the CBD. A balloon sweep maneuver was  performed followed by placement of an endoscopic biliary stent into the common bile duct. This appears well position. IMPRESSION: Diffuse biliary dilatation secondary to potentially a focal stricture of the distal CBD. An endoscopic biliary stent was placed in the common bile duct. These images were submitted for radiologic interpretation only. Please see the procedural report for the amount of contrast and the fluoroscopy time utilized. Electronically Signed   By: Aletta Edouard M.D.   On: 09/05/2019 16:12    Assessment / Plan:  8. 86 year old female with painless jaundice presented to [facility] ED on 3/24. Laboratory studies showed acute cholestasis. Abdominal sono showed gallstones with a dilated CBD 14.73m, multiple gallstones with intra and extra hepatic dilatation. MRCP 3/25 showed choledocholithiasis with a cluster of small stones in the distal CBD, dilation of the dorsal pancreatic duct, several clustered cystic lesions to the tail of the pancreas and a distended gallbladder with numerous gallstones and mild gallbladder wall thickening.  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 No evidence of choledocholithiasis. S/P sphincterotomy and biliary stent placement to the CBD. Dilated pancreatic duct also noted. LFTS drifting downward. Alk phos 464. AST 156. ALT 152. T. Bili 10.4.  Lipase 455 up from 351 post ERCP . WBC 3.4. Temp 98.4 F. -Continue NS @ 100cc/hr. -Heart healthy diet  -Ok to discharge home today -Plan for EUS as outpatient  -  Repeat CBC and hepatic panel next week -Follow up in office with Dr. Henrene Pastor in 2 to 4 weeks.   2. Acute on chronic anemia, dilutional component.  Hg 10.1- > 8.9 -> 7.5.  (Hemoglobin 7.8  2 days ago). MCV 94. No signs of post sphincterotomy bleed.   3.Thrombocytopenia, acute. PLT 124  4. CKD stage 4. Cr. 1.73. (Baseline Cr. 1.97)  5. History  of bladder cancer 2003, self catheterizes [place] pouch.  6. UTI. On Rocephin.   7. Hypokalemia, resolved. K+ 3.9  8. LUQ abdominal bruit on exam, aorta pulsatile. Abdominal sono without evidence of an aortic aneurysm.  Further recommendations per Dr. Verta Ellen Dorathy Daft  09/04/2019, 5:16 AM   LOS: 3 days   Noralyn Pick  09/06/2019, 5:32 AM  GI ATTENDING  Interval history and data reviewed.  Patient seen and examined.  Agree with above.  Patient stable after ERCP with biliary stent placement.  Okay for discharge.  She will follow-up in the office with me in 2 to 4 weeks.  Thanks  Docia Chuck. Geri Seminole., M.D. [facility]

## 2019-09-08 ENCOUNTER — Other Ambulatory Visit (INDEPENDENT_AMBULATORY_CARE_PROVIDER_SITE_OTHER): Payer: Medicare Other

## 2019-09-08 ENCOUNTER — Telehealth: Payer: Self-pay

## 2019-09-08 ENCOUNTER — Encounter: Payer: Self-pay | Admitting: *Deleted

## 2019-09-08 DIAGNOSIS — R17 Unspecified jaundice: Secondary | ICD-10-CM

## 2019-09-08 DIAGNOSIS — K831 Obstruction of bile duct: Secondary | ICD-10-CM | POA: Diagnosis not present

## 2019-09-08 LAB — CBC WITH DIFFERENTIAL/PLATELET
Basophils Absolute: 0.1 10*3/uL (ref 0.0–0.1)
Basophils Relative: 0.7 % (ref 0.0–3.0)
Eosinophils Absolute: 0.1 10*3/uL (ref 0.0–0.7)
Eosinophils Relative: 1.4 % (ref 0.0–5.0)
HCT: 25.8 % — ABNORMAL LOW (ref 36.0–46.0)
Hemoglobin: 8.5 g/dL — ABNORMAL LOW (ref 12.0–15.0)
Lymphocytes Relative: 11.8 % — ABNORMAL LOW (ref 12.0–46.0)
Lymphs Abs: 0.9 10*3/uL (ref 0.7–4.0)
MCHC: 33.1 g/dL (ref 30.0–36.0)
MCV: 97.8 fl (ref 78.0–100.0)
Monocytes Absolute: 0.6 10*3/uL (ref 0.1–1.0)
Monocytes Relative: 7.5 % (ref 3.0–12.0)
Neutro Abs: 6.2 10*3/uL (ref 1.4–7.7)
Neutrophils Relative %: 78.6 % — ABNORMAL HIGH (ref 43.0–77.0)
Platelets: 157 10*3/uL (ref 150.0–400.0)
RBC: 2.63 Mil/uL — ABNORMAL LOW (ref 3.87–5.11)
RDW: 15.6 % — ABNORMAL HIGH (ref 11.5–15.5)
WBC: 7.9 10*3/uL (ref 4.0–10.5)

## 2019-09-08 LAB — HEPATIC FUNCTION PANEL
ALT: 127 U/L — ABNORMAL HIGH (ref 0–35)
AST: 88 U/L — ABNORMAL HIGH (ref 0–37)
Albumin: 3.1 g/dL — ABNORMAL LOW (ref 3.5–5.2)
Alkaline Phosphatase: 432 U/L — ABNORMAL HIGH (ref 39–117)
Bilirubin, Direct: 3.3 mg/dL — ABNORMAL HIGH (ref 0.0–0.3)
Total Bilirubin: 5.9 mg/dL — ABNORMAL HIGH (ref 0.2–1.2)
Total Protein: 5.9 g/dL — ABNORMAL LOW (ref 6.0–8.3)

## 2019-09-08 NOTE — Anesthesia Postprocedure Evaluation (Signed)
Anesthesia Post Note  Patient: CONCHA SUDOL  Procedure(s) Performed: ENDOSCOPIC RETROGRADE CHOLANGIOPANCREATOGRAPHY (ERCP) (N/A ) SPHINCTEROTOMY BILIARY STENT PLACEMENT (N/A )     Patient location during evaluation: PACU Anesthesia Type: General Level of consciousness: awake and alert Pain management: pain level controlled Vital Signs Assessment: post-procedure vital signs reviewed and stable Respiratory status: spontaneous breathing, nonlabored ventilation, respiratory function stable and patient connected to nasal cannula oxygen Cardiovascular status: blood pressure returned to baseline and stable Postop Assessment: no apparent nausea or vomiting Anesthetic complications: no    Last Vitals:  Vitals:   09/06/19 0155 09/06/19 0520  BP: 108/62 (!) 102/48  Pulse: 64 63  Resp: 18 18  Temp: 36.7 C 36.9 C  SpO2: 98% 97%    Last Pain:  Vitals:   09/06/19 0730  TempSrc:   PainSc: 0-No pain                 Effie Berkshire

## 2019-09-08 NOTE — Telephone Encounter (Signed)
-----   Message from Noralyn Pick, NP sent at 09/06/2019  3:33 PM EDT ----- Please schedule patient for labs to include CBC and hepatic panel next week and follow up office appointment with Dr. Henrene Pastor in office in 2 weeks. thx

## 2019-09-08 NOTE — Telephone Encounter (Signed)
Dr. Henrene Pastor, see notes below, pt has follow up appt with you 4/28.

## 2019-09-08 NOTE — Telephone Encounter (Signed)
Called and spoke with patient-patient advised of provider's recommendations and is agreeable to plan of care; patient was given the address to the [facility] office and advised to go for lab work (basement); patient was also scheduled for a f/u post hosp appt with Dr. Henrene Pastor on 10/08/2019 at 9:00 am (this is the first available appt); Patient advised to call back to the office at [phone] should questions/concerns arise;  Patient verbalized understanding of information/instructions;   Lab orders placed in Epic;

## 2019-09-09 NOTE — Telephone Encounter (Signed)
Noted! Thank you

## 2019-09-11 ENCOUNTER — Other Ambulatory Visit: Payer: Self-pay

## 2019-09-11 DIAGNOSIS — K831 Obstruction of bile duct: Secondary | ICD-10-CM

## 2019-09-22 ENCOUNTER — Other Ambulatory Visit (INDEPENDENT_AMBULATORY_CARE_PROVIDER_SITE_OTHER): Payer: Medicare Other

## 2019-09-22 DIAGNOSIS — K831 Obstruction of bile duct: Secondary | ICD-10-CM | POA: Diagnosis not present

## 2019-09-22 LAB — HEPATIC FUNCTION PANEL
ALT: 46 U/L — ABNORMAL HIGH (ref 0–35)
AST: 41 U/L — ABNORMAL HIGH (ref 0–37)
Albumin: 3.4 g/dL — ABNORMAL LOW (ref 3.5–5.2)
Alkaline Phosphatase: 183 U/L — ABNORMAL HIGH (ref 39–117)
Bilirubin, Direct: 1.1 mg/dL — ABNORMAL HIGH (ref 0.0–0.3)
Total Bilirubin: 2.3 mg/dL — ABNORMAL HIGH (ref 0.2–1.2)
Total Protein: 6.9 g/dL (ref 6.0–8.3)

## 2019-09-23 ENCOUNTER — Other Ambulatory Visit: Payer: Self-pay

## 2019-09-23 DIAGNOSIS — R17 Unspecified jaundice: Secondary | ICD-10-CM

## 2019-09-23 DIAGNOSIS — K831 Obstruction of bile duct: Secondary | ICD-10-CM

## 2019-10-02 ENCOUNTER — Encounter: Payer: Self-pay | Admitting: Internal Medicine

## 2019-10-02 NOTE — Telephone Encounter (Signed)
OPENED IN ERROR

## 2019-10-06 ENCOUNTER — Other Ambulatory Visit (INDEPENDENT_AMBULATORY_CARE_PROVIDER_SITE_OTHER): Payer: Medicare Other

## 2019-10-06 DIAGNOSIS — K831 Obstruction of bile duct: Secondary | ICD-10-CM

## 2019-10-06 DIAGNOSIS — R17 Unspecified jaundice: Secondary | ICD-10-CM

## 2019-10-06 LAB — HEPATIC FUNCTION PANEL
ALT: 37 U/L — ABNORMAL HIGH (ref 0–35)
AST: 29 U/L (ref 0–37)
Albumin: 3.6 g/dL (ref 3.5–5.2)
Alkaline Phosphatase: 277 U/L — ABNORMAL HIGH (ref 39–117)
Bilirubin, Direct: 0.7 mg/dL — ABNORMAL HIGH (ref 0.0–0.3)
Total Bilirubin: 1.5 mg/dL — ABNORMAL HIGH (ref 0.2–1.2)
Total Protein: 7.6 g/dL (ref 6.0–8.3)

## 2019-10-06 LAB — CBC WITH DIFFERENTIAL/PLATELET
Basophils Absolute: 0.1 10*3/uL (ref 0.0–0.1)
Basophils Relative: 1.1 % (ref 0.0–3.0)
Eosinophils Absolute: 0.2 10*3/uL (ref 0.0–0.7)
Eosinophils Relative: 2.5 % (ref 0.0–5.0)
HCT: 28.9 % — ABNORMAL LOW (ref 36.0–46.0)
Hemoglobin: 9.5 g/dL — ABNORMAL LOW (ref 12.0–15.0)
Lymphocytes Relative: 17.4 % (ref 12.0–46.0)
Lymphs Abs: 1.1 10*3/uL (ref 0.7–4.0)
MCHC: 33 g/dL (ref 30.0–36.0)
MCV: 98.4 fl (ref 78.0–100.0)
Monocytes Absolute: 0.6 10*3/uL (ref 0.1–1.0)
Monocytes Relative: 8.9 % (ref 3.0–12.0)
Neutro Abs: 4.3 10*3/uL (ref 1.4–7.7)
Neutrophils Relative %: 70.1 % (ref 43.0–77.0)
Platelets: 175 10*3/uL (ref 150.0–400.0)
RBC: 2.94 Mil/uL — ABNORMAL LOW (ref 3.87–5.11)
RDW: 15 % (ref 11.5–15.5)
WBC: 6.2 10*3/uL (ref 4.0–10.5)

## 2019-10-08 ENCOUNTER — Ambulatory Visit (INDEPENDENT_AMBULATORY_CARE_PROVIDER_SITE_OTHER): Payer: Medicare Other | Admitting: Internal Medicine

## 2019-10-08 ENCOUNTER — Telehealth: Payer: Self-pay

## 2019-10-08 ENCOUNTER — Other Ambulatory Visit (INDEPENDENT_AMBULATORY_CARE_PROVIDER_SITE_OTHER): Payer: Medicare Other

## 2019-10-08 ENCOUNTER — Encounter: Payer: Self-pay | Admitting: Internal Medicine

## 2019-10-08 VITALS — BP 134/62 | HR 82 | Temp 97.9°F | Ht 66.5 in | Wt 100.8 lb

## 2019-10-08 DIAGNOSIS — R17 Unspecified jaundice: Secondary | ICD-10-CM

## 2019-10-08 DIAGNOSIS — R634 Abnormal weight loss: Secondary | ICD-10-CM

## 2019-10-08 DIAGNOSIS — K831 Obstruction of bile duct: Secondary | ICD-10-CM

## 2019-10-08 LAB — CBC WITH DIFFERENTIAL/PLATELET
Basophils Absolute: 0.1 10*3/uL (ref 0.0–0.1)
Basophils Relative: 0.9 % (ref 0.0–3.0)
Eosinophils Absolute: 0.1 10*3/uL (ref 0.0–0.7)
Eosinophils Relative: 1.9 % (ref 0.0–5.0)
HCT: 27.7 % — ABNORMAL LOW (ref 36.0–46.0)
Hemoglobin: 9.4 g/dL — ABNORMAL LOW (ref 12.0–15.0)
Lymphocytes Relative: 11.2 % — ABNORMAL LOW (ref 12.0–46.0)
Lymphs Abs: 0.7 10*3/uL (ref 0.7–4.0)
MCHC: 33.8 g/dL (ref 30.0–36.0)
MCV: 97.3 fl (ref 78.0–100.0)
Monocytes Absolute: 0.4 10*3/uL (ref 0.1–1.0)
Monocytes Relative: 6.4 % (ref 3.0–12.0)
Neutro Abs: 4.7 10*3/uL (ref 1.4–7.7)
Neutrophils Relative %: 79.6 % — ABNORMAL HIGH (ref 43.0–77.0)
Platelets: 178 10*3/uL (ref 150.0–400.0)
RBC: 2.85 Mil/uL — ABNORMAL LOW (ref 3.87–5.11)
RDW: 14.9 % (ref 11.5–15.5)
WBC: 5.9 10*3/uL (ref 4.0–10.5)

## 2019-10-08 LAB — HEPATIC FUNCTION PANEL
ALT: 32 U/L (ref 0–35)
AST: 29 U/L (ref 0–37)
Albumin: 3.6 g/dL (ref 3.5–5.2)
Alkaline Phosphatase: 227 U/L — ABNORMAL HIGH (ref 39–117)
Bilirubin, Direct: 0.7 mg/dL — ABNORMAL HIGH (ref 0.0–0.3)
Total Bilirubin: 1.4 mg/dL — ABNORMAL HIGH (ref 0.2–1.2)
Total Protein: 7.5 g/dL (ref 6.0–8.3)

## 2019-10-08 NOTE — Patient Instructions (Signed)
Your provider has requested that you go to the basement level for lab work before leaving today. Press "B" on the elevator. The lab is located at the first door on the left as you exit the elevator.  You will be hearing from our office regarding your endoscopic ultrasound

## 2019-10-08 NOTE — Progress Notes (Signed)
HISTORY OF PRESENT ILLNESS:  Glenda King is a 86 y.o. female, native of [place], who was admitted to the hospital September 03, 2019 with painless jaundice and weight loss.  Initial imaging the ultrasound revealed biliary dilation and gallstones.  Subsequent MRCP suggested stacked distal common duct stones in addition to biliary and pancreatic ductal dilation.  Cystic lesions of the pancreatic tail also noted.  Peak liver test on admission included AST 157, ALT 186, alkaline phosphatase 421, total bilirubin 15.4.  White blood cell count 5.0 and hemoglobin 9.5.  She subsequently underwent ERCP September 05, 2019.  She was found to have dilation of both the pancreatic duct and the common bile duct.  The cystic duct filled.  There were gallstones.  She did NOT have common duct stones.  She did have a distal bile duct stricture.  Plastic biliary stent was placed.  She was discharged home the following day.  She has had no issues since.  Her jaundice has resolved.  No pruritus.  Her baseline weight is 115 pounds.  Current weight 100 pounds.  Liver tests from earlier this week have improved with AST 29, ALT 37, alkaline phosphatase 277, and total bilirubin 1.5.  Hemoglobin 9.5.  She has no new complaints.  She did have some itching which has resolved.  She does have questions regarding her condition and ensuing plans.  REVIEW OF SYSTEMS:  All non-GI ROS negative unless otherwise stated in the HPI except for fatigue, allergies  Past Medical History:  Diagnosis Date  . Anemia   . Cancer [facility])    bladder cancer  . Cardiomyopathy ([place])   . CKD (chronic kidney disease)   . Thyroid disease    hypothyroid    Past Surgical History:  Procedure Laterality Date  . BILIARY STENT PLACEMENT N/A 09/05/2019   Procedure: BILIARY STENT PLACEMENT;  Surgeon: Irene Shipper, MD;  Location: [facility] ENDOSCOPY;  Service: Endoscopy;  Laterality: N/A;  . ERCP N/A 09/05/2019   Procedure: ENDOSCOPIC RETROGRADE  CHOLANGIOPANCREATOGRAPHY (ERCP);  Surgeon: Irene Shipper, MD;  Location: [facility];  Service: Endoscopy;  Laterality: N/A;  . SPHINCTEROTOMY  09/05/2019   Procedure: SPHINCTEROTOMY;  Surgeon: Irene Shipper, MD;  Location: [facility];  Service: Endoscopy;;    Social History [name]  reports that she has never smoked. She has never used smokeless tobacco. She reports previous alcohol use. She reports previous drug use.  family history is not on file.  Allergies  Allergen Reactions  . Amoxicillin     Pt unsure of reaction     . Gadolinium Derivatives Other (See Comments)    Secondary to her stage IV kidney disease Secondary to her stage IV kidney disease   . Pollen Extract Other (See Comments)    Sneezing       PHYSICAL EXAMINATION: Vital signs: BP 134/62   Pulse 82   Temp 97.9 F (36.6 C)   Ht 5' 6.5" (1.689 m)   Wt 100 lb 12.8 oz (45.7 kg)   BMI 16.03 kg/m   Constitutional: Thin with temporal wasting, no acute distress Psychiatric: Pleasant, alert and oriented x3, cooperative Eyes: extraocular movements intact, anicteric, conjunctiva pink Mouth: oral pharynx moist, no lesions Neck: supple no lymphadenopathy Cardiovascular: heart regular rate and rhythm, no murmur Lungs: clear to auscultation bilaterally Abdomen: soft, nontender, nondistended, no obvious ascites, no peritoneal signs, normal bowel sounds, no organomegaly Rectal:  Omitted Extremities: no clubbing, cyanosis, or lower extremity edema bilaterally Skin: no lesions  on visible extremities Neuro: No focal deficits.  Cranial nerves intact  ASSESSMENT:  1.  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  Pancreatic ductal dilation as well.  Given her presentation, including weight loss, I am quite concerned about underlying neoplasm.  She is now status post sphincterotomy and biliary stenting with resolution of jaundice.  No acute problems. 2.   Cholelithiasis without choledocholithiasis 3.  Weight loss.  As above  PLAN:  1.  Long discussion regarding her pancreaticobiliary anatomy, her ERCP, stent places.  Discussed the concerns about underlying neoplasm.  Discussed the plans.  I provided her with a detailed diagram of the lipid anatomy to supplement the discussion. 2.  Blood work today including CA 19-9, CBC, and LFTs 3.  Endoscopic ultrasound with Dr. Rush Landmark.  I have discussed the case with him in detail.  He will arrange the procedure date and share with the patient.  She is aware. 4.  We did discuss signs and symptoms of stent occlusion including pain, fever, and jaundice.  She knows to contact the office immediately and/or proceed to the hospital should this occur. A total time of 40 minutes was spent preparing to see the patient, reviewing laboratory test, obtaining interval history, performing physical examination, significant counseling and education of the patient regarding her condition, ordering additional blood work today, coordinating for advanced procedure, and documenting clinical information in the health record

## 2019-10-08 NOTE — Telephone Encounter (Signed)
-----   Message from Irving Copas., MD sent at 10/08/2019  4:21 AM EDT ----- Regarding: RE: Review case Candler Ginsberg, Please move forward with scheduling this patient an EUS Linear with fine-needle biopsy 60-minute procedure with me in the next few weeks. Please let JP and I know when this is scheduled for. Thanks. GM ----- Message ----- From: Irene Shipper, MD Sent: 10/07/2019   6:59 PM EDT To: Irving Copas., MD Subject: RE: Review case                                Gabe, Thanks for the review. I'll check CA 19-9 tomorrow. Let's go with EUS as next step. You can interrogate for tissue or cells as indicated. Let me know about when this might occur, as I'll share this information with her and her family. Thanks, as always! John ----- Message ----- From: Irving Copas., MD Sent: 10/07/2019   5:33 PM EDT To: Irene Shipper, MD Subject: RE: Review case                                John,I had a chance to look at your pre-ERCP MRI CP as well as your ERCP.Distal biliary stricture definitely concerning and in the setting of her dilated pancreas duct that I saw you were able to inject as well I do have concerns about a potential underlying lesion that we are missing.Endoscopic ultrasound would be very reasonable.We certainly can do a repeat ERCP with stent removal biliary brushings and consider cholangioscopy at the same time point.May be a few weeks out but we certainly can do that.May be reasonable now that she has been decompressed also do a CA 19-9 to see if that is significantly elevated to help [place] progress towards the diagnosis of either a extrahepatic cholangio versus a potential ampullary versus pancreatic lesion although the ductal dilation in the pancreas would make it less likely to be an extrahepatic cholangio-.Please let me know what you and the patient discuss and want to consider doing.Thanks.GM ----- Message ----- From: Irene Shipper, MD Sent: 10/07/2019  12:03 PM  EDT To: Irving Copas., MD Subject: Review case                                    Gabe,Patient with indeterminate distal biliary stricture.  Dilation of both the CBD and PD.  No apparent mass on standard imaging.  Stented.  Trending laboratories with improvement.  I feel she may need EUS/possible spyglass.  Please review at your convenience.  I am seeing her in the office tomorrow.  We can discuss.  Thanks as always.Jenny Reichmann

## 2019-10-09 LAB — CANCER ANTIGEN 19-9: CA 19-9: 194 U/mL — ABNORMAL HIGH (ref <34)

## 2019-10-10 NOTE — Telephone Encounter (Signed)
Left message on machine to call back  

## 2019-10-13 ENCOUNTER — Other Ambulatory Visit: Payer: Self-pay

## 2019-10-13 DIAGNOSIS — R634 Abnormal weight loss: Secondary | ICD-10-CM

## 2019-10-13 DIAGNOSIS — R17 Unspecified jaundice: Secondary | ICD-10-CM

## 2019-10-13 DIAGNOSIS — K831 Obstruction of bile duct: Secondary | ICD-10-CM

## 2019-10-14 NOTE — Telephone Encounter (Signed)
EUS scheduled for 6/23 at [facility] with Dr Rush Landmark at [place] am COVID test 6/19 955. EUS scheduled, pt instructed and medications reviewed.  Patient instructions mailed to home.  Patient to call with any questions or concerns.

## 2019-11-29 ENCOUNTER — Other Ambulatory Visit (HOSPITAL_COMMUNITY)
Admission: RE | Admit: 2019-11-29 | Discharge: 2019-11-29 | Disposition: A | Discharge status: Home / Self Care | Payer: Medicare Other | Source: Ambulatory Visit | Attending: Gastroenterology | Admitting: Gastroenterology

## 2019-11-29 DIAGNOSIS — Z20822 Contact with and (suspected) exposure to covid-19: Secondary | ICD-10-CM | POA: Insufficient documentation

## 2019-11-29 DIAGNOSIS — Z01812 Encounter for preprocedural laboratory examination: Secondary | ICD-10-CM | POA: Insufficient documentation

## 2019-11-29 LAB — SARS CORONAVIRUS 2 (TAT 6-24 HRS): SARS Coronavirus 2: NEGATIVE

## 2019-12-03 ENCOUNTER — Encounter (HOSPITAL_COMMUNITY)
Admission: RE | Disposition: A | Discharge status: Home / Self Care | Payer: Self-pay | Source: Home / Self Care | Attending: Gastroenterology

## 2019-12-03 ENCOUNTER — Encounter (HOSPITAL_COMMUNITY): Payer: Self-pay | Admitting: Gastroenterology

## 2019-12-03 ENCOUNTER — Other Ambulatory Visit: Payer: Self-pay

## 2019-12-03 ENCOUNTER — Ambulatory Visit (HOSPITAL_COMMUNITY): Payer: Medicare Other | Admitting: Certified Registered Nurse Anesthetist

## 2019-12-03 ENCOUNTER — Ambulatory Visit (HOSPITAL_COMMUNITY)
Admission: RE | Admit: 2019-12-03 | Discharge: 2019-12-03 | Disposition: A | Discharge status: Home / Self Care | Payer: Medicare Other | Attending: Gastroenterology | Admitting: Gastroenterology

## 2019-12-03 DIAGNOSIS — K2970 Gastritis, unspecified, without bleeding: Secondary | ICD-10-CM

## 2019-12-03 DIAGNOSIS — K3189 Other diseases of stomach and duodenum: Secondary | ICD-10-CM | POA: Diagnosis not present

## 2019-12-03 DIAGNOSIS — R945 Abnormal results of liver function studies: Secondary | ICD-10-CM | POA: Insufficient documentation

## 2019-12-03 DIAGNOSIS — Z91041 Radiographic dye allergy status: Secondary | ICD-10-CM | POA: Diagnosis not present

## 2019-12-03 DIAGNOSIS — E039 Hypothyroidism, unspecified: Secondary | ICD-10-CM | POA: Insufficient documentation

## 2019-12-03 DIAGNOSIS — R634 Abnormal weight loss: Secondary | ICD-10-CM

## 2019-12-03 DIAGNOSIS — K2950 Unspecified chronic gastritis without bleeding: Secondary | ICD-10-CM | POA: Insufficient documentation

## 2019-12-03 DIAGNOSIS — K449 Diaphragmatic hernia without obstruction or gangrene: Secondary | ICD-10-CM | POA: Insufficient documentation

## 2019-12-03 DIAGNOSIS — I429 Cardiomyopathy, unspecified: Secondary | ICD-10-CM | POA: Insufficient documentation

## 2019-12-03 DIAGNOSIS — R17 Unspecified jaundice: Secondary | ICD-10-CM

## 2019-12-03 DIAGNOSIS — K831 Obstruction of bile duct: Secondary | ICD-10-CM

## 2019-12-03 DIAGNOSIS — Z8551 Personal history of malignant neoplasm of bladder: Secondary | ICD-10-CM | POA: Diagnosis not present

## 2019-12-03 DIAGNOSIS — K8689 Other specified diseases of pancreas: Secondary | ICD-10-CM | POA: Diagnosis not present

## 2019-12-03 DIAGNOSIS — N189 Chronic kidney disease, unspecified: Secondary | ICD-10-CM | POA: Diagnosis not present

## 2019-12-03 DIAGNOSIS — K259 Gastric ulcer, unspecified as acute or chronic, without hemorrhage or perforation: Secondary | ICD-10-CM | POA: Diagnosis not present

## 2019-12-03 DIAGNOSIS — Z880 Allergy status to penicillin: Secondary | ICD-10-CM | POA: Diagnosis not present

## 2019-12-03 DIAGNOSIS — R932 Abnormal findings on diagnostic imaging of liver and biliary tract: Secondary | ICD-10-CM | POA: Diagnosis not present

## 2019-12-03 HISTORY — PX: FINE NEEDLE ASPIRATION: SHX5430

## 2019-12-03 HISTORY — PX: ESOPHAGOGASTRODUODENOSCOPY (EGD) WITH PROPOFOL: SHX5813

## 2019-12-03 HISTORY — PX: BIOPSY: SHX5522

## 2019-12-03 HISTORY — PX: EUS: SHX5427

## 2019-12-03 SURGERY — ESOPHAGOGASTRODUODENOSCOPY (EGD) WITH PROPOFOL
Anesthesia: Monitor Anesthesia Care

## 2019-12-03 MED ORDER — PROPOFOL 500 MG/50ML IV EMUL
INTRAVENOUS | Status: DC | PRN
  Administered 2019-12-03: 100 ug/kg/min via INTRAVENOUS

## 2019-12-03 MED ORDER — EPHEDRINE SULFATE 50 MG/ML IJ SOLN
INTRAMUSCULAR | Status: DC | PRN
Start: 2019-12-03 — End: 2019-12-03
  Administered 2019-12-03 (×2): 5 mg via INTRAVENOUS
  Administered 2019-12-03: 10 mg via INTRAVENOUS

## 2019-12-03 MED ORDER — SODIUM CHLORIDE 0.9 % IV SOLN: INTRAVENOUS | Status: DC

## 2019-12-03 MED ORDER — PROPOFOL 10 MG/ML IV BOLUS
INTRAVENOUS | Status: AC
  Filled 2019-12-03: qty 20

## 2019-12-03 MED ORDER — OMEPRAZOLE 40 MG PO CPDR: 40.0000 mg | DELAYED_RELEASE_CAPSULE | Freq: Every day | ORAL | 3 refills | Status: DC

## 2019-12-03 MED ORDER — LACTATED RINGERS IV SOLN
INTRAVENOUS | Status: DC
  Administered 2019-12-03: 1000 mL via INTRAVENOUS

## 2019-12-03 SURGICAL SUPPLY — 14 items

## 2019-12-03 NOTE — Anesthesia Preprocedure Evaluation (Signed)
Anesthesia Evaluation  Patient identified by MRN, date of birth, ID band Patient awake    Reviewed: Allergy & Precautions, NPO status , Patient's Chart, lab work & pertinent test results, reviewed documented beta blocker date and time   Airway Mallampati: I  TM Distance: >3 FB Neck ROM: Full    Dental  (+) Teeth Intact   Pulmonary neg pulmonary ROS,    Pulmonary exam normal        Cardiovascular negative cardio ROS Normal cardiovascular exam     Neuro/Psych negative neurological ROS  negative psych ROS   GI/Hepatic negative GI ROS,   Endo/Other  Hypothyroidism   Renal/GU CRFRenal disease     Musculoskeletal negative musculoskeletal ROS (+)   Abdominal Normal abdominal exam  (+)   Peds  Hematology negative hematology ROS (+)   Anesthesia Other Findings   Reproductive/Obstetrics                             Anesthesia Physical  Anesthesia Plan  ASA: III  Anesthesia Plan: MAC   Post-op Pain Management:    Induction: Intravenous  PONV Risk Score and Plan: 2 and Treatment may vary due to age or medical condition and Propofol infusion  Airway Management Planned: Natural Airway and Nasal Cannula  Additional Equipment: None  Intra-op Plan:   Post-operative Plan:   Informed Consent: I have reviewed the patients History and Physical, chart, labs and discussed the procedure including the risks, benefits and alternatives for the proposed anesthesia with the patient or authorized representative who has indicated his/her understanding and acceptance.       Plan Discussed with:   Anesthesia Plan Comments:         Anesthesia Quick Evaluation

## 2019-12-03 NOTE — Discharge Instructions (Signed)
YOU HAD AN ENDOSCOPIC PROCEDURE TODAY: Refer to the procedure report and other information in the discharge instructions given to you for any specific questions about what was found during the examination. If this information does not answer your questions, please call [place] office at [phone] to clarify.   YOU SHOULD EXPECT: Some feelings of bloating in the abdomen. Passage of more gas than usual. Walking can help get rid of the air that was put into your GI tract during the procedure and reduce the bloating. If you had a lower endoscopy (such as a colonoscopy or flexible sigmoidoscopy) you may notice spotting of blood in your stool or on the toilet paper. Some abdominal soreness may be present for a day or two, also.  DIET: Your first meal following the procedure should be a light meal and then it is ok to progress to your normal diet. A half-sandwich or bowl of soup is an example of a good first meal. Heavy or fried foods are harder to digest and may make you feel nauseous or bloated. Drink plenty of fluids but you should avoid alcoholic beverages for 24 hours. If you had a esophageal dilation, please see attached instructions for diet.    ACTIVITY: Your care partner should take you home directly after the procedure. You should plan to take it easy, moving slowly for the rest of the day. You can resume normal activity the day after the procedure however YOU SHOULD NOT DRIVE, use power tools, machinery or perform tasks that involve climbing or major physical exertion for 24 hours (because of the sedation medicines used during the test).   SYMPTOMS TO REPORT IMMEDIATELY: A gastroenterologist can be reached at any hour. Please call [phone]  for any of the following symptoms:   Following upper endoscopy (EGD, EUS, ERCP, esophageal dilation) Vomiting of blood or coffee ground material  New, significant abdominal pain  New, significant chest pain or pain under the shoulder blades  Painful or  persistently difficult swallowing  New shortness of breath  Black, tarry-looking or red, bloody stools  FOLLOW UP:  If any biopsies were taken you will be contacted by phone or by letter within the next 1-3 weeks. Call [phone]  if you have not heard about the biopsies in 3 weeks.  Please also call with any specific questions about appointments or follow up tests.  

## 2019-12-03 NOTE — Transfer of Care (Signed)
Immediate Anesthesia Transfer of Care Note  Patient: GRECIA LYNK  Procedure(s) Performed: ESOPHAGOGASTRODUODENOSCOPY (EGD) WITH PROPOFOL (N/A ) UPPER ENDOSCOPIC ULTRASOUND (EUS) LINEAR (N/A ) FINE NEEDLE ASPIRATION (FNA) LINEAR (N/A ) BIOPSY  Patient Location: PACU  Anesthesia Type:MAC  Level of Consciousness: sedated, patient cooperative and responds to stimulation  Airway & Oxygen Therapy: Patient Spontanous Breathing and Patient connected to face mask oxygen  Post-op Assessment: Report given to RN and Post -op Vital signs reviewed and stable  Post vital signs: Reviewed and stable  Last Vitals:  Vitals Value Taken Time  BP 101/43 12/03/19 0845  Temp    Pulse 86 12/03/19 0846  Resp 26 12/03/19 0846  SpO2 100 % 12/03/19 0846  Vitals shown include unvalidated device data.  Last Pain:  Vitals:   12/03/19 0654  TempSrc: Oral  PainSc: 0-No pain         Complications: No complications documented.

## 2019-12-03 NOTE — H&P (Signed)
GASTROENTEROLOGY PROCEDURE H&P NOTE   Primary Care Physician: Javier Glazier, MD  HPI: Glenda King is a 86 y.o. female who presents for EGD/EUS for evaluation of double duct sign.  Past Medical History:  Diagnosis Date  . Anemia   . Cancer [facility])    bladder cancer  . Cardiomyopathy ([place])   . CKD (chronic kidney disease)   . Thyroid disease    hypothyroid   Past Surgical History:  Procedure Laterality Date  . BILIARY STENT PLACEMENT N/A 09/05/2019   Procedure: BILIARY STENT PLACEMENT;  Surgeon: Irene Shipper, MD;  Location: [facility] ENDOSCOPY;  Service: Endoscopy;  Laterality: N/A;  . ERCP N/A 09/05/2019   Procedure: ENDOSCOPIC RETROGRADE CHOLANGIOPANCREATOGRAPHY (ERCP);  Surgeon: Irene Shipper, MD;  Location: [facility];  Service: Endoscopy;  Laterality: N/A;  . SPHINCTEROTOMY  09/05/2019   Procedure: SPHINCTEROTOMY;  Surgeon: Irene Shipper, MD;  Location: [facility];  Service: Endoscopy;;   Current Facility-Administered Medications  Medication Dose Route Frequency Provider Last Rate Last Admin  . 0.9 %  sodium chloride infusion   Intravenous Continuous Mansouraty, Telford Nab., MD      . lactated ringers infusion   Intravenous Continuous Mansouraty, Telford Nab., MD 10 mL/hr at 12/03/19 0712 1,000 mL at 12/03/19 8469   Allergies  Allergen Reactions  . Amoxicillin     Pt unsure of reaction     . Gadolinium Derivatives Other (See Comments)    Secondary to her stage IV kidney disease   . Pollen Extract Other (See Comments)    Sneezing   History reviewed. No pertinent family history. Social History   Socioeconomic History  . Marital status: Married    Spouse name: Not on file  . Number of children: Not on file  . Years of education: Not on file  . Highest education level: Not on file  Occupational History  . Not on file  Tobacco Use  . Smoking status: Never Smoker  . Smokeless tobacco: Never Used  Substance and Sexual Activity  . Alcohol use: Not  Currently  . Drug use: Not Currently  . Sexual activity: Not on file  Other Topics Concern  . Not on file  Social History Narrative  . Not on file   Social Determinants of Health   Financial Resource Strain:   . Difficulty of Paying Living Expenses:   Food Insecurity:   . Worried About Charity fundraiser in the Last Year:   . Arboriculturist in the Last Year:   Transportation Needs:   . Film/video editor (Medical):   Marland Kitchen Lack of Transportation (Non-Medical):   Physical Activity:   . Days of Exercise per Week:   . Minutes of Exercise per Session:   Stress:   . Feeling of Stress :   Social Connections:   . Frequency of Communication with Friends and Family:   . Frequency of Social Gatherings with Friends and Family:   . Attends Religious Services:   . Active Member of Clubs or Organizations:   . Attends Archivist Meetings:   Marland Kitchen Marital Status:   Intimate Partner Violence:   . Fear of Current or Ex-Partner:   . Emotionally Abused:   Marland Kitchen Physically Abused:   . Sexually Abused:     Physical Exam: Vital signs in last 24 hours: Temp:  [98 F (36.7 C)] 98 F (36.7 C) (06/23 0654) Pulse Rate:  [71] 71 (06/23 0654) Resp:  [18] 18 (06/23 0654) BP: (  127)/(70) 127/70 (06/23 0654) SpO2:  [99 %] 99 % (06/23 0654) Weight:  [88 kg] 88 kg (06/23 0654)   GEN: NAD EYE: Sclerae anicteric ENT: MMM CV: Non-tachycardic GI: Soft, mild TTP in midabdomen NEURO:  Alert & Oriented x 3  Lab Results: No results for input(s): WBC, HGB, HCT, PLT in the last 72 hours. BMET No results for input(s): NA, K, CL, CO2, GLUCOSE, BUN, CREATININE, CALCIUM in the last 72 hours. LFT No results for input(s): PROT, ALBUMIN, AST, ALT, ALKPHOS, BILITOT, BILIDIR, IBILI in the last 72 hours. PT/INR No results for input(s): LABPROT, INR in the last 72 hours.   Impression / Plan: This is a 86 y.o.female who presents for EGD/EUS for evaluation of double duct sign.  The risks of an EUS  including intestinal perforation, bleeding, infection, aspiration, and medication effects were discussed as was the possibility it may not give a definitive diagnosis if a biopsy is performed.  When a biopsy of the pancreas is done as part of the EUS, there is an additional risk of pancreatitis at the rate of about 1-2%.  It was explained that procedure related pancreatitis is typically mild, although it can be severe and even life threatening, which is why we do not perform random pancreatic biopsies and only biopsy a lesion/area we feel is concerning enough to warrant the risk.  The risks and benefits of endoscopic evaluation were discussed with the patient; these include but are not limited to the risk of perforation, infection, bleeding, missed lesions, lack of diagnosis, severe illness requiring hospitalization, as well as anesthesia and sedation related illnesses.  The patient is agreeable to proceed.    Justice Britain, MD [facility] Office # [phone]

## 2019-12-03 NOTE — Anesthesia Postprocedure Evaluation (Signed)
Anesthesia Post Note  Patient: Glenda King  Procedure(s) Performed: ESOPHAGOGASTRODUODENOSCOPY (EGD) WITH PROPOFOL (N/A ) UPPER ENDOSCOPIC ULTRASOUND (EUS) LINEAR (N/A ) FINE NEEDLE ASPIRATION (FNA) LINEAR (N/A ) BIOPSY     Patient location during evaluation: Endoscopy Anesthesia Type: MAC Level of consciousness: awake and alert Pain management: pain level controlled Vital Signs Assessment: post-procedure vital signs reviewed and stable Respiratory status: spontaneous breathing, nonlabored ventilation and respiratory function stable Cardiovascular status: blood pressure returned to baseline and stable Postop Assessment: no apparent nausea or vomiting Anesthetic complications: no   No complications documented.  Last Vitals:  Vitals:   12/03/19 0910 12/03/19 0925  BP: (!) 115/50 (!) 113/51  Pulse: 70 78  Resp: (!) 21 11  Temp:    SpO2: 99% 95%    Last Pain:  Vitals:   12/03/19 0925  TempSrc:   PainSc: 0-No pain                 Lidia Collum

## 2019-12-03 NOTE — Op Note (Signed)
[facility] Patient Name: Glenda King Procedure Date: 12/03/2019 MRN: [id number] Attending MD: Justice Britain , MD Date of Birth: [birth year] CSN: [id number] Age: 86 Admit Type: Outpatient Procedure:                Upper EUS Indications:              Common bile duct dilation (acquired) seen on MRCP,                            Dilated pancreatic duct on MRCP, Obstruction of                            bile duct on MRCP, Abnormal MRCP, Abnormal liver                            function test history though improved s/p prior ERCP Providers:                Justice Britain, MD, Cleda Daub, RN, Laverda Sorenson, Technician, Tyrone Apple, Technician,                            Herbie Drape, CRNA Referring MD:             Docia Chuck. Henrene Pastor, MD, Javier Glazier Medicines:                Monitored Anesthesia Care Complications:            No immediate complications. Estimated Blood Loss:     Estimated blood loss was minimal. Procedure:                Pre-Anesthesia Assessment:                           - Prior to the procedure, a History and Physical                            was performed, and patient medications and                            allergies were reviewed. The patient's tolerance of                            previous anesthesia was also reviewed. The risks                            and benefits of the procedure and the sedation                            options and risks were discussed with the patient.                            All questions were answered, and informed consent  was obtained. Prior Anticoagulants: The patient has                            taken no previous anticoagulant or antiplatelet                            agents. ASA Grade Assessment: III - A patient with                            severe systemic disease. After reviewing the risks                            and  benefits, the patient was deemed in                            satisfactory condition to undergo the procedure.                           After obtaining informed consent, the endoscope was                            passed under direct vision. Throughout the                            procedure, the patient's blood pressure, pulse, and                            oxygen saturations were monitored continuously. The                            [id number]-[id number] ([id number]) Olympus gastroscope was                            introduced through the mouth, and advanced to the                            second part of duodenum. The [id number]-[id number] ([id number])                            Olympus Duodenoscope was introduced through the                            mouth, and advanced to the second part of duodenum.                            The [id number] ([id number]) Olympus Linear EUS was                            introduced through the mouth, and advanced to the                            duodenum for ultrasound examination from the  stomach and duodenum. The upper EUS was                            accomplished without difficulty. The patient                            tolerated the procedure. Scope In: Scope Out: Findings:      ENDOSCOPIC FINDING: :      No gross lesions were noted in the entire esophagus.      A small hiatal hernia was found. The proximal extent of the gastric       folds (end of tubular esophagus) was 40 cm from the incisors. The hiatal       narrowing was 41 cm from the incisors. The Z-line was 39 cm from the       incisors.      Patchy moderate inflammation characterized by erosions, erythema and       friability was found in the gastric body, at the incisura and in the       gastric antrum. Biopsies were taken with a cold forceps for histology       and Helicobacter pylori testing.      No gross lesions were noted in the duodenal bulb, in the first portion       of  the duodenum and in the second portion of the duodenum.      A previously placed plastic biliary stent was seen at the major papilla.      Localized mild mucosal changes characterized by granularity and altered       texture were found at the major papilla. Biopsies were taken with a cold       forceps for histology.      ENDOSONOGRAPHIC FINDING: :      An irregular hypoechoic region was identified in the pancreatic head       right where the pancreatic duct began to dilate. The area measured 22 mm       by 20 mm in maximal cross-sectional diameter. The outer margins were       irregular. There was sonographic evidence suggesting abutment of the       superior mesenteric vein. The remainder of the pancreas was examined.       The endosonographic appearance of parenchyma and the upstream pancreatic       duct indicated duct dilation and parenchymal atrophy. BD dilation noted       in the neck and the body was noted from the dilated pancreatic duct.       (PDH = 5.2 mm, PDN = 7.1 mm -> 4.5 mm, PDB = 5.7 mm, PDT = 4.2 mm). Two       cysts were noted in the region of the pancreatic tail (23 mm x 16 mm and       16 mm x 13 mm). Fine needle biopsy was performed of the hypoechoic       region in the head right as the PD dilated. Color Doppler imaging was       utilized prior to needle puncture to confirm a lack of significant       vascular structures within the needle path. Five passes were made with       the 22 gauge ultrasound core biopsy needle using a transduodenal       approach. A visible core of  tissue was obtained. Preliminary cytologic       examination and touch preps were performed. The cellularity of the       specimen was adequate. Final cytology results are pending.      One stent was visualized endosonographically in the common bile duct.       Extension of the stent was noted in the common bile duct.      Minimal hyperechoic material consistent with sludge was visualized        endosonographically in the common bile duct above the stent.      There was dilation in the common bile duct above the stent but not to       extent of prior MRICP.      Endosonographic imaging of the ampulla showed no intramural       (subepithelial) lesion.      Endosonographic imaging in the visualized portion of the liver showed no       mass.      No malignant-appearing lymph nodes were visualized in the celiac region       (level 20), peripancreatic region and porta hepatis region.      The celiac region was visualized. Impression:               EGD Impression:                           - No gross lesions in esophagus.                           - Small hiatal hernia.                           - Gastritis. Biopsied.                           - No gross lesions in the duodenal bulb, in the                            first portion of the duodenum and in the second                            portion of the duodenum.                           - Plastic biliary stent in the duodenum.                           - Mucosal changes at ampulla. Biopsied.                           EUS Impression:                           - A hypoechoic region was identified in the                            pancreatic head. Cytology results are pending.  However, the endosonographic appearance is                            suspicious for potential malignancy in the setting                            of the double duct sign and prior presentation with                            painless jaundice. This was staged T2 N0 Mx by                            endosonographic criteria if malignancy is                            confirmed. Fine needle biopsy performed.                           - One stent was visualized endosonographically in                            the common bile duct.                           - Hyperechoic material consistent with sludge was                             visualized endosonographically in the common bile                            duct.                           - There was dilation in the common bile duct.                           - No malignant-appearing lymph nodes were                            visualized in the celiac region (level 20),                            peripancreatic region and porta hepatis region. Moderate Sedation:      Not Applicable - Patient had care per Anesthesia. Recommendation:           - The patient will be observed post-procedure,                            until all discharge criteria are met.                           - Discharge patient to home.                           - Patient has a contact number available for  emergencies. The signs and symptoms of potential                            delayed complications were discussed with the                            patient. Return to normal activities tomorrow.                            Written discharge instructions were provided to the                            patient.                           - Low fat diet.                           - Monitor for signs/symptoms of bleeding,                            perforation, and infection. If issues please call                            our number to get further assistance as needed.                           - Observe patient's clinical course.                           - Await cytology results and await path results.                           - Start Omeprazole 40 mg daily.                           - If non-diagnostic biopsies, I recommend repeat                            EUS with stent pull and repeat FNB attempt and with                            ERCP and Spyglass in the future.                           - The findings and recommendations were discussed                            with the patient.                           - The findings and recommendations were discussed                             with the patient's family. Procedure Code(s):        ---  Professional ---                           [phone], Esophagogastroduodenoscopy, flexible,                            transoral; with transendoscopic ultrasound-guided                            intramural or transmural fine needle                            aspiration/biopsy(s), (includes endoscopic                            ultrasound examination limited to the esophagus,                            stomach or duodenum, and adjacent structures) Diagnosis Code(s):        --- Professional ---                           K44.9, Diaphragmatic hernia without obstruction or                            gangrene                           K29.70, Gastritis, unspecified, without bleeding                           K31.89, Other diseases of stomach and duodenum                           K86.89, Other specified diseases of pancreas                           K83.8, Other specified diseases of biliary tract                           I89.9, Noninfective disorder of lymphatic vessels                            and lymph nodes, unspecified                           K83.1, Obstruction of bile duct                           R94.5, Abnormal results of liver function studies                           R93.2, Abnormal findings on diagnostic imaging of                            liver and biliary tract CPT copyright 2019 [employer]. All rights reserved. The codes documented in this report are preliminary and  upon coder review may  be revised to meet current compliance requirements. Justice Britain, MD 12/03/2019 9:23:27 AM Number of Addenda: 0

## 2019-12-04 ENCOUNTER — Other Ambulatory Visit: Payer: Self-pay

## 2019-12-04 LAB — SURGICAL PATHOLOGY

## 2019-12-05 ENCOUNTER — Encounter: Payer: Self-pay | Admitting: Gastroenterology

## 2019-12-05 ENCOUNTER — Encounter: Payer: Self-pay | Admitting: *Deleted

## 2019-12-05 ENCOUNTER — Other Ambulatory Visit: Payer: Self-pay

## 2019-12-05 DIAGNOSIS — C259 Malignant neoplasm of pancreas, unspecified: Secondary | ICD-10-CM

## 2019-12-05 LAB — CYTOLOGY - NON PAP

## 2019-12-05 NOTE — Progress Notes (Signed)
Reached out to [name] to introduce myself as the office RN Navigator and explain our new patient process. Reviewed the reason for their referral and scheduled their new patient appointment along with labs. Provided address and directions to the office including call back phone number. Reviewed with patient any concerns they may have or any possible barriers to attending their appointment.   Informed patient about my role as a navigator and that I will meet with them prior to their New Patient appointment and more fully discuss what services I can provide. At this time patient has no further questions or needs.  Was able to schedule patient's New Patient Appointment for right after a CT that has been scheduled by the referring physicians office.

## 2019-12-09 ENCOUNTER — Other Ambulatory Visit (INDEPENDENT_AMBULATORY_CARE_PROVIDER_SITE_OTHER): Payer: Medicare Other

## 2019-12-09 DIAGNOSIS — C259 Malignant neoplasm of pancreas, unspecified: Secondary | ICD-10-CM

## 2019-12-09 LAB — COMPREHENSIVE METABOLIC PANEL
ALT: 57 U/L — ABNORMAL HIGH (ref 0–35)
AST: 37 U/L (ref 0–37)
Albumin: 3.8 g/dL (ref 3.5–5.2)
Alkaline Phosphatase: 436 U/L — ABNORMAL HIGH (ref 39–117)
BUN: 42 mg/dL — ABNORMAL HIGH (ref 6–23)
CO2: 29 mEq/L (ref 19–32)
Calcium: 9.4 mg/dL (ref 8.4–10.5)
Chloride: 101 mEq/L (ref 96–112)
Creatinine, Ser: 1.59 mg/dL — ABNORMAL HIGH (ref 0.40–1.20)
GFR: 30.6 mL/min — ABNORMAL LOW (ref >60.00)
Glucose, Bld: 163 mg/dL — ABNORMAL HIGH (ref 70–99)
Potassium: 4.6 mEq/L (ref 3.5–5.1)
Sodium: 138 mEq/L (ref 135–145)
Total Bilirubin: 0.8 mg/dL (ref 0.2–1.2)
Total Protein: 7.9 g/dL (ref 6.0–8.3)

## 2019-12-09 LAB — CBC WITH DIFFERENTIAL/PLATELET
Basophils Absolute: 0.1 10*3/uL (ref 0.0–0.1)
Basophils Relative: 1.1 % (ref 0.0–3.0)
Eosinophils Absolute: 0.1 10*3/uL (ref 0.0–0.7)
Eosinophils Relative: 1.6 % (ref 0.0–5.0)
HCT: 31.2 % — ABNORMAL LOW (ref 36.0–46.0)
Hemoglobin: 10.3 g/dL — ABNORMAL LOW (ref 12.0–15.0)
Lymphocytes Relative: 13.5 % (ref 12.0–46.0)
Lymphs Abs: 1 10*3/uL (ref 0.7–4.0)
MCHC: 33.1 g/dL (ref 30.0–36.0)
MCV: 95.9 fl (ref 78.0–100.0)
Monocytes Absolute: 0.4 10*3/uL (ref 0.1–1.0)
Monocytes Relative: 5.5 % (ref 3.0–12.0)
Neutro Abs: 5.9 10*3/uL (ref 1.4–7.7)
Neutrophils Relative %: 78.3 % — ABNORMAL HIGH (ref 43.0–77.0)
Platelets: 189 10*3/uL (ref 150.0–400.0)
RBC: 3.25 Mil/uL — ABNORMAL LOW (ref 3.87–5.11)
RDW: 13.5 % (ref 11.5–15.5)
WBC: 7.5 10*3/uL (ref 4.0–10.5)

## 2019-12-10 ENCOUNTER — Encounter: Payer: Self-pay | Admitting: Hematology & Oncology

## 2019-12-10 ENCOUNTER — Encounter: Payer: Self-pay | Admitting: *Deleted

## 2019-12-10 ENCOUNTER — Other Ambulatory Visit: Payer: Self-pay

## 2019-12-10 ENCOUNTER — Telehealth: Payer: Self-pay | Admitting: Gastroenterology

## 2019-12-10 ENCOUNTER — Ambulatory Visit (HOSPITAL_BASED_OUTPATIENT_CLINIC_OR_DEPARTMENT_OTHER)
Admission: RE | Admit: 2019-12-10 | Discharge: 2019-12-10 | Disposition: A | Discharge status: Home / Self Care | Payer: Medicare Other | Source: Ambulatory Visit | Attending: Gastroenterology | Admitting: Gastroenterology

## 2019-12-10 ENCOUNTER — Inpatient Hospital Stay (HOSPITAL_BASED_OUTPATIENT_CLINIC_OR_DEPARTMENT_OTHER): Payer: Medicare Other | Admitting: Hematology & Oncology

## 2019-12-10 ENCOUNTER — Inpatient Hospital Stay: Payer: Medicare Other | Attending: Hematology & Oncology

## 2019-12-10 VITALS — BP 141/68 | HR 86 | Temp 97.6°F | Resp 18 | Ht 66.0 in | Wt 98.0 lb

## 2019-12-10 DIAGNOSIS — Z79899 Other long term (current) drug therapy: Secondary | ICD-10-CM | POA: Diagnosis not present

## 2019-12-10 DIAGNOSIS — C259 Malignant neoplasm of pancreas, unspecified: Secondary | ICD-10-CM

## 2019-12-10 DIAGNOSIS — K831 Obstruction of bile duct: Secondary | ICD-10-CM

## 2019-12-10 DIAGNOSIS — C670 Malignant neoplasm of trigone of bladder: Secondary | ICD-10-CM

## 2019-12-10 DIAGNOSIS — N1832 Chronic kidney disease, stage 3b: Secondary | ICD-10-CM

## 2019-12-10 DIAGNOSIS — N184 Chronic kidney disease, stage 4 (severe): Secondary | ICD-10-CM | POA: Insufficient documentation

## 2019-12-10 DIAGNOSIS — Z8551 Personal history of malignant neoplasm of bladder: Secondary | ICD-10-CM

## 2019-12-10 DIAGNOSIS — Z7189 Other specified counseling: Secondary | ICD-10-CM

## 2019-12-10 DIAGNOSIS — Z880 Allergy status to penicillin: Secondary | ICD-10-CM | POA: Insufficient documentation

## 2019-12-10 HISTORY — DX: Malignant neoplasm of pancreas, unspecified: C25.9

## 2019-12-10 HISTORY — DX: Other specified counseling: Z71.89

## 2019-12-10 LAB — CMP (CANCER CENTER ONLY)
ALT: 57 U/L — ABNORMAL HIGH (ref 0–44)
AST: 40 U/L (ref 15–41)
Albumin: 4.2 g/dL (ref 3.5–5.0)
Alkaline Phosphatase: 490 U/L — ABNORMAL HIGH (ref 38–126)
Anion gap: 11 (ref 5–15)
BUN: 42 mg/dL — ABNORMAL HIGH (ref 8–23)
CO2: 29 mmol/L (ref 22–32)
Calcium: 10.1 mg/dL (ref 8.9–10.3)
Chloride: 100 mmol/L (ref 98–111)
Creatinine: 1.72 mg/dL — ABNORMAL HIGH (ref 0.44–1.00)
GFR, Est AFR Am: 30 mL/min — ABNORMAL LOW (ref >60)
GFR, Estimated: 26 mL/min — ABNORMAL LOW (ref >60)
Glucose, Bld: 114 mg/dL — ABNORMAL HIGH (ref 70–99)
Potassium: 4.8 mmol/L (ref 3.5–5.1)
Sodium: 140 mmol/L (ref 135–145)
Total Bilirubin: 0.9 mg/dL (ref 0.3–1.2)
Total Protein: 8.8 g/dL — ABNORMAL HIGH (ref 6.5–8.1)

## 2019-12-10 LAB — CBC WITH DIFFERENTIAL (CANCER CENTER ONLY)
Abs Immature Granulocytes: 0.04 10*3/uL (ref 0.00–0.07)
Basophils Absolute: 0.1 10*3/uL (ref 0.0–0.1)
Basophils Relative: 1 %
Eosinophils Absolute: 0.1 10*3/uL (ref 0.0–0.5)
Eosinophils Relative: 1 %
HCT: 34.3 % — ABNORMAL LOW (ref 36.0–46.0)
Hemoglobin: 10.8 g/dL — ABNORMAL LOW (ref 12.0–15.0)
Immature Granulocytes: 0 %
Lymphocytes Relative: 13 %
Lymphs Abs: 1.2 10*3/uL (ref 0.7–4.0)
MCH: 31.2 pg (ref 26.0–34.0)
MCHC: 31.5 g/dL (ref 30.0–36.0)
MCV: 99.1 fL (ref 80.0–100.0)
Monocytes Absolute: 0.5 10*3/uL (ref 0.1–1.0)
Monocytes Relative: 6 %
Neutro Abs: 7.7 10*3/uL (ref 1.7–7.7)
Neutrophils Relative %: 79 %
Platelet Count: 213 10*3/uL (ref 150–400)
RBC: 3.46 MIL/uL — ABNORMAL LOW (ref 3.87–5.11)
RDW: 12.9 % (ref 11.5–15.5)
WBC Count: 9.5 10*3/uL (ref 4.0–10.5)
nRBC: 0 % (ref 0.0–0.2)

## 2019-12-10 LAB — CANCER ANTIGEN 19-9: CA 19-9: 224 U/mL — ABNORMAL HIGH (ref <34)

## 2019-12-10 LAB — LACTATE DEHYDROGENASE: LDH: 98 U/L (ref 98–192)

## 2019-12-10 LAB — IRON AND TIBC
Iron: 143 ug/dL (ref 28–170)
Saturation Ratios: 34 % — ABNORMAL HIGH (ref 10.4–31.8)
TIBC: 423 ug/dL (ref 250–450)
UIBC: 280 ug/dL

## 2019-12-10 LAB — PREALBUMIN: Prealbumin: 25.5 mg/dL (ref 18–38)

## 2019-12-10 LAB — FERRITIN: Ferritin: 70 ng/mL (ref 11–307)

## 2019-12-10 IMAGING — CT CT ABD-PELV W/O CM
2 of 4 series · 11 of 36 positions shown, 13 images · non-contrast
Comparison: MRI/MRCP [DATE]

CLINICAL DATA: Staging adenocarcinoma of the pancreas.

EXAM:
CT CHEST, ABDOMEN AND PELVIS WITHOUT CONTRAST
TECHNIQUE: Multidetector CT imaging of the chest, abdomen and pelvis was
performed following the standard protocol without IV contrast.

[Series 2: axial st · axial · 0.72mm/px · z∈[-603,-73]mm · 8 of 130 slices shown, 10 images]
[im 12/130  mediastinal]
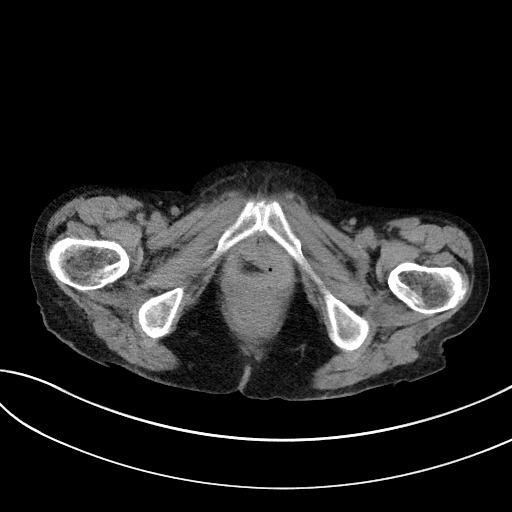
[im 12/130  lung]
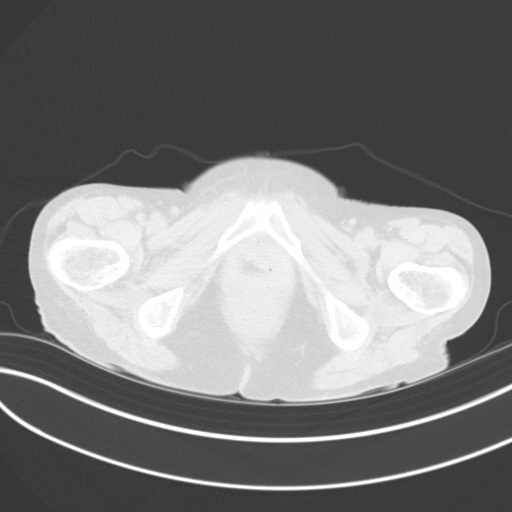
[im 24/130  lung]
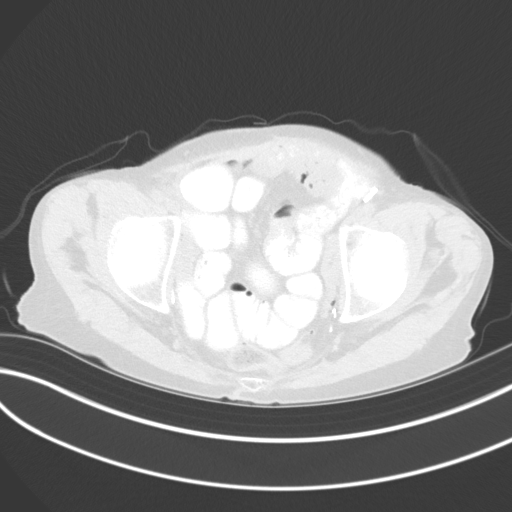
[im 47/130  lung]
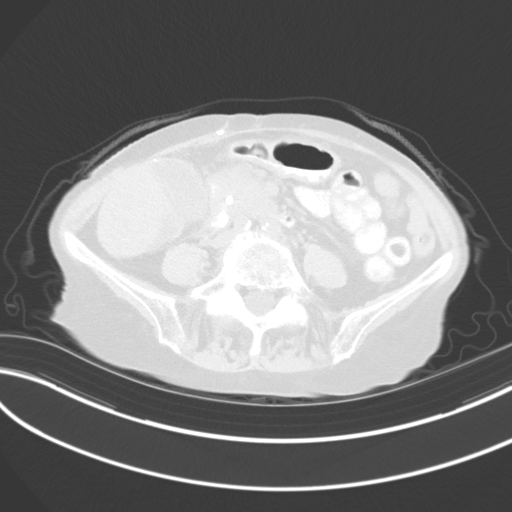
[im 59/130  lung]
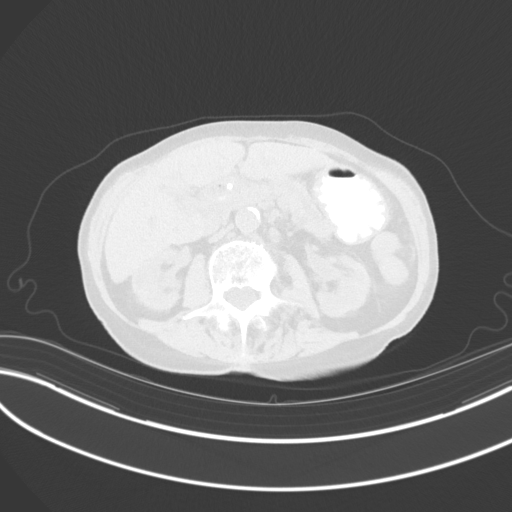
[im 71/130  mediastinal]
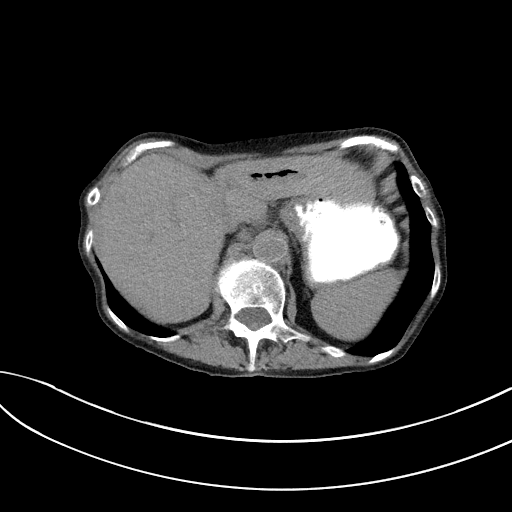
[im 71/130  lung]
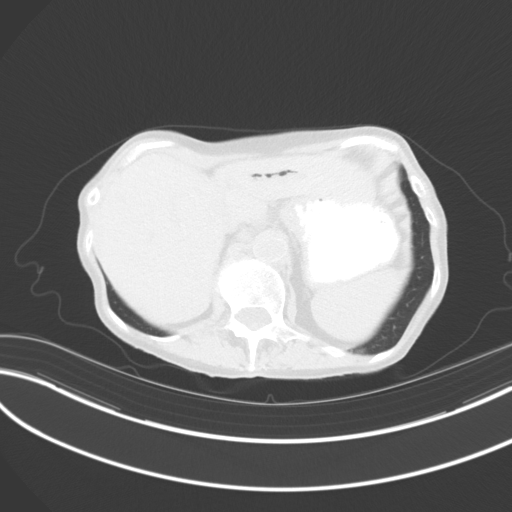
[im 83/130  lung]
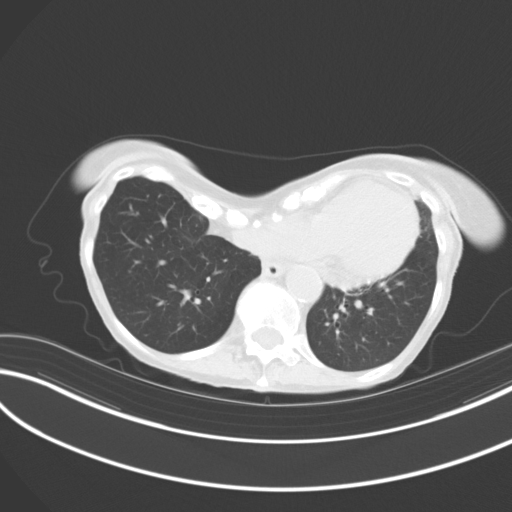
[im 106/130  lung]
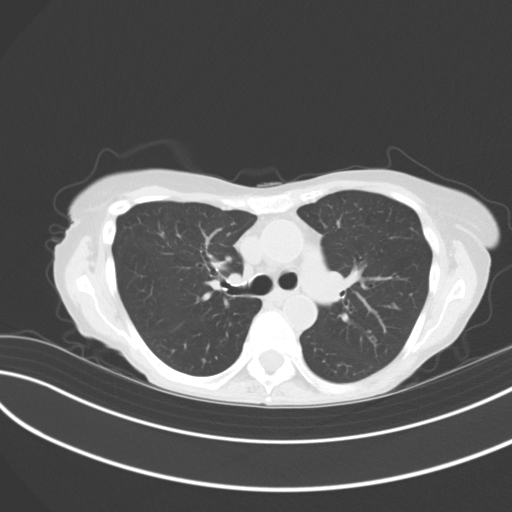
[im 118/130  lung]
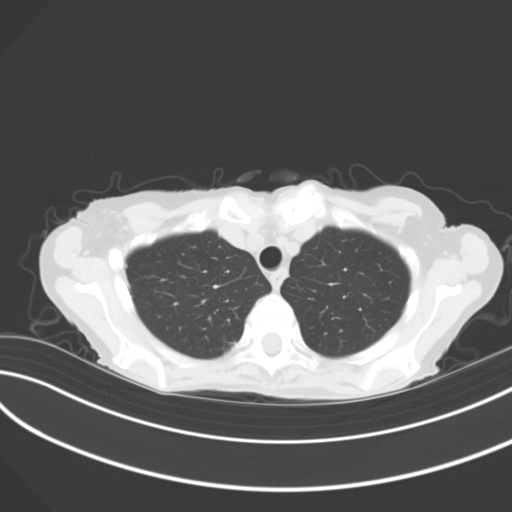

[Series 5: coronal · coronal · 0.74mm/px · 3 of 111 slices shown]
[im 23/111  lung]
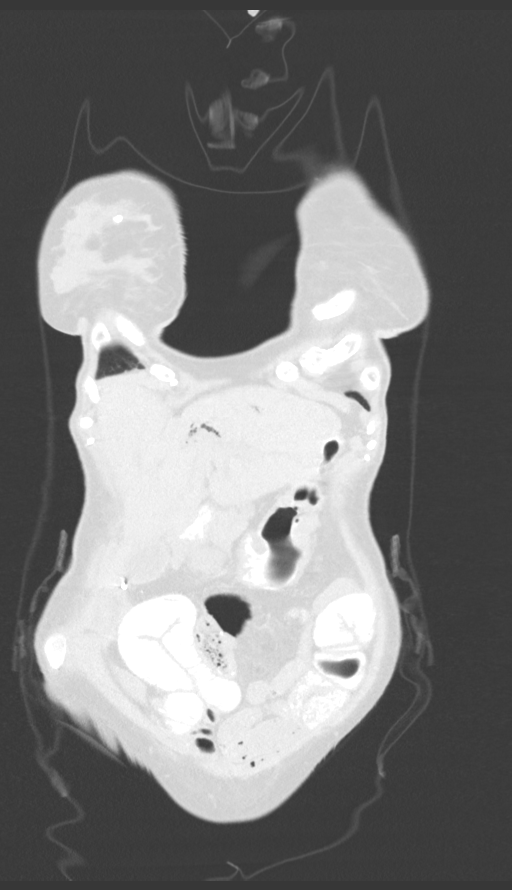
[im 45/111  lung]
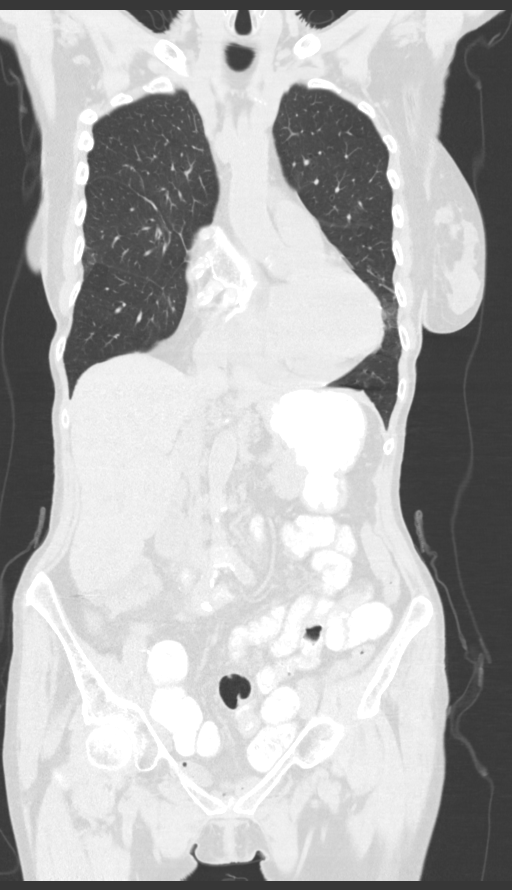
[im 67/111  lung]
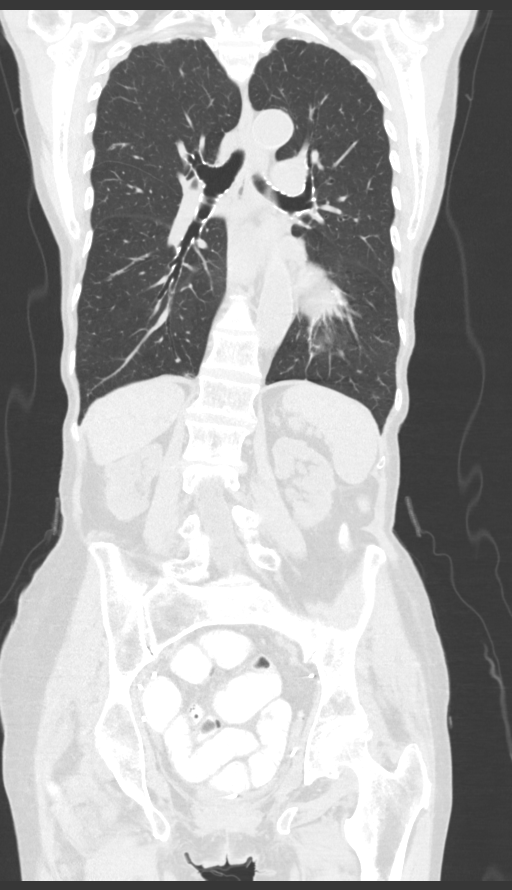

[11 of 36 positions shown; findings below may reference images not displayed]

FINDINGS: CT CHEST FINDINGS

Cardiovascular: Normal heart size. Aortic atherosclerosis. No
pericardial effusion identified.

Mediastinum/Nodes: The trachea appears patent and is midline. Normal
appearance of the esophagus. No enlarged axillary, supraclavicular,
or mediastinal adenopathy.

Lungs/Pleura: No pleural effusion identified. Tiny nonspecific
nodule identified within the lateral left lower lobe measuring 2 mm,
image 107/4. Right middle lobe lung nodule measures 4 mm, image
95/4. Cluster of tree-in-bud nodules within the posterior right
lower lobe are identified, likely postinflammatory. Scar noted
within the subpleural right lower lobe. 2 mm posterolateral right
lower lobe lung nodule noted. Within the anterior right lower lobe
there is a 3 mm nodule, image 116/4.

Musculoskeletal: Marked pectus deformity identified. T8 vertebral
hemangioma. No acute or suspicious osseous findings.

CT ABDOMEN PELVIS FINDINGS

Hepatobiliary: Within the limitations of unenhanced technique there
is no suspicious liver lesion. Pneumobilia is identified within left
lobe of liver. A common bile duct stent decompresses the previous
dilated CBD.

Pancreas: Persistent main duct dilatation identified which measures
7 mm at the level of the pancreatic head and neck. The underlying,
biopsy-proven suspicious lesion within the head of pancreas is
difficult to discern within the limitations of unenhanced technique.
Cystic lesions within the distal tail of pancreas are again noted.
The dominant cystic lesion measures 1.9 by 1.9 cm, image 68/2.

Spleen: Spleen is unremarkable.

Adrenals/Urinary Tract: Adrenal glands appear normal. Bilateral
renal atrophy and scarring identified. Similar appearance of
bilateral hydronephrosis. Status post cystectomy with right lower
quadrant diverting urostomy.

Stomach/Bowel: The stomach appears unremarkable. Proximal small
bowel loops appear normal. Increase caliber of mid small bowel loops
which measure up to 3.2 cm and contain multiple air-fluid levels.
Transition to decreased caliber distal small bowel loops noted in
the right lower quadrant of the abdomen near anastomotic suture
line, image 33/5.

Vascular/Lymphatic: Aortic atherosclerosis without aneurysm. No
abdominal or pelvic adenopathy identified.

Evaluation of upper abdominal vasculature including the portal vein,
and superior mesenteric artery is limited due to lack of intravenous
contrast material.

Reproductive: Status post hysterectomy. No adnexal masses.

Other: No free fluid or fluid collections. No convincing evidence
for peritoneal nodularity or mass.

Musculoskeletal: Scoliosis and degenerative disc disease. No acute
or suspicious osseous findings.
IMPRESSION: 1. The underlying biopsy-proven suspicious lesion within the head of
pancreas is difficult to discern secondary to limitations of
unenhanced technique. Further, assessment of involvement of the
superior mesenteric artery and portal vein is challenging due to
lack of IV contrast material. Refer to endoscopic ultrasound report
from [DATE] regarding pancreatic head mass and involvement of
surrounding vasculature.
2. No specific findings identified to suggest solid organ or nodal
metastasis within the abdomen or pelvis.
3. Small nonspecific pulmonary nodules are identified measuring up
to 4 mm. These are nonspecific but warrant attention on follow-up
imaging.
4. Similar appearance of bilateral hydronephrosis. Status post
cystectomy with right lower quadrant urinary diversion.
5. Abnormal increase caliber of the mid small bowel loops with
transition to decreased caliber distal small bowel loops in the
right lower quadrant of the abdomen near anastomotic suture line.
Findings are suspicious for at least a partial small bowel
obstruction which may be secondary to anastomotic stricture.
Clinical correlation for any signs or symptoms of bowel obstruction
is advised.
6. Aortic atherosclerosis.

Aortic Atherosclerosis ([8G]-[8G]).

These results will be called to the ordering clinician or
representative by the Radiologist Assistant, and communication
documented in the PACS or [REDACTED].

## 2019-12-10 IMAGING — CT CT CHEST W/O CM
2 of 4 series · 11 of 36 positions shown, 13 images · non-contrast
Comparison: MRI/MRCP [DATE]

CLINICAL DATA: Staging adenocarcinoma of the pancreas.

EXAM:
CT CHEST, ABDOMEN AND PELVIS WITHOUT CONTRAST
TECHNIQUE: Multidetector CT imaging of the chest, abdomen and pelvis was
performed following the standard protocol without IV contrast.

[Series 2: axial st · axial · 0.72mm/px · z∈[-603,-73]mm · 8 of 130 slices shown, 10 images]
[im 12/130  mediastinal]
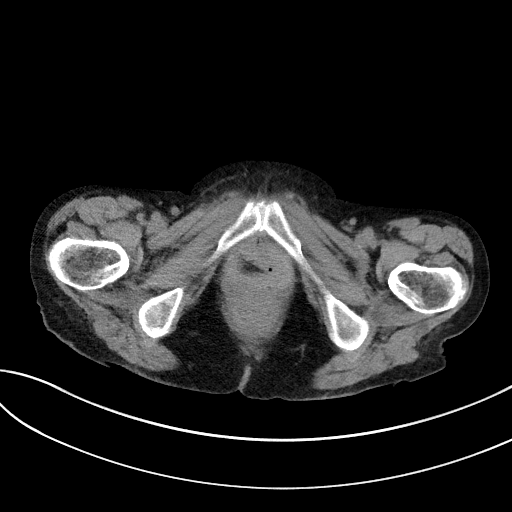
[im 12/130  lung]
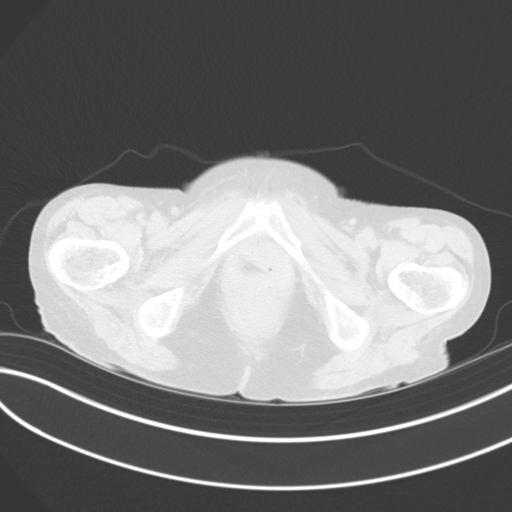
[im 24/130  lung]
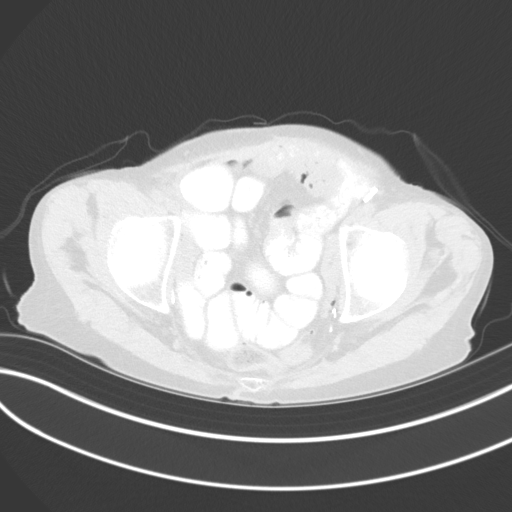
[im 47/130  lung]
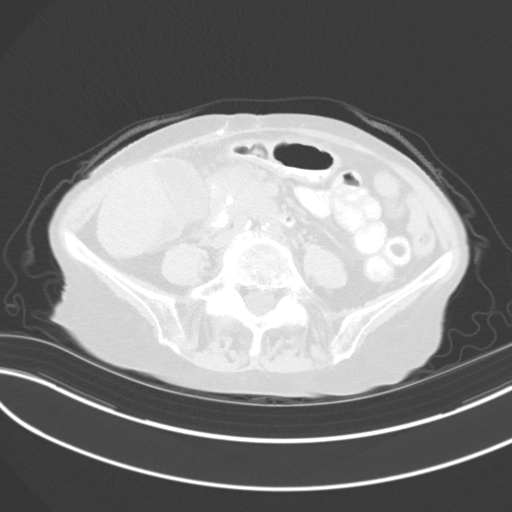
[im 59/130  lung]
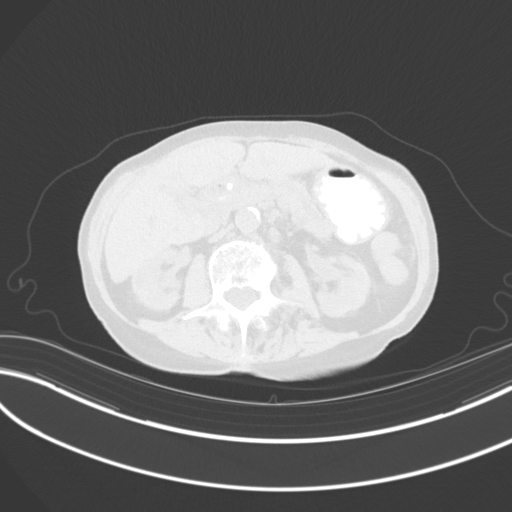
[im 71/130  mediastinal]
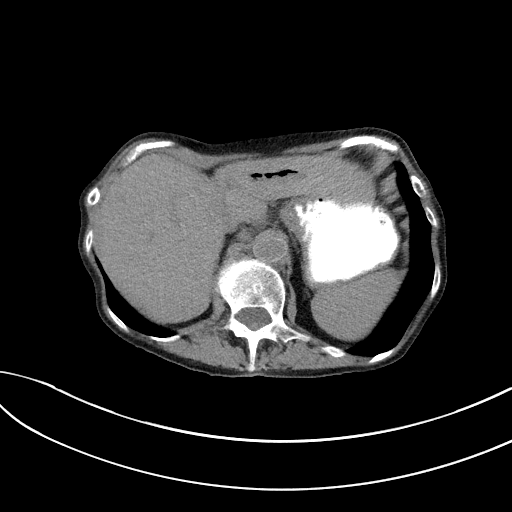
[im 71/130  lung]
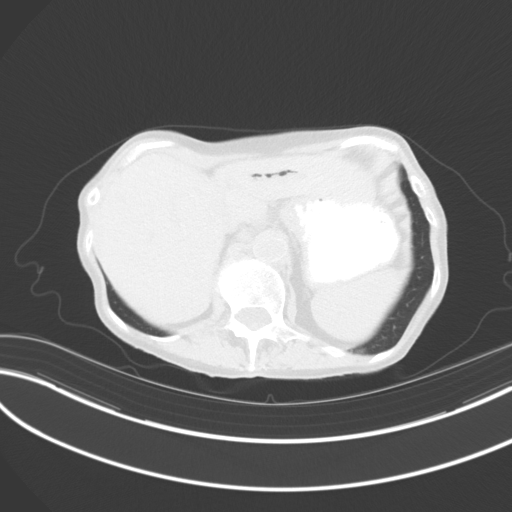
[im 83/130  lung]
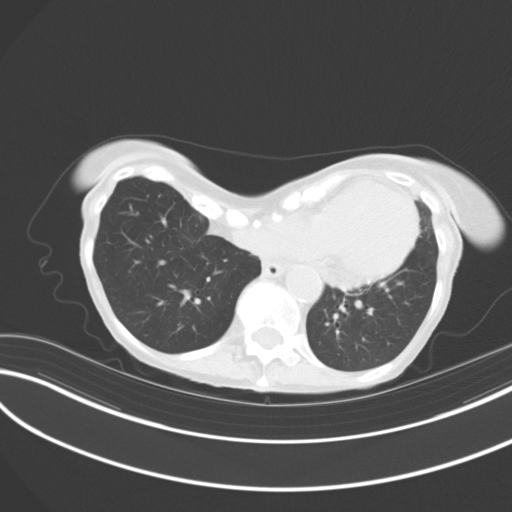
[im 106/130  lung]
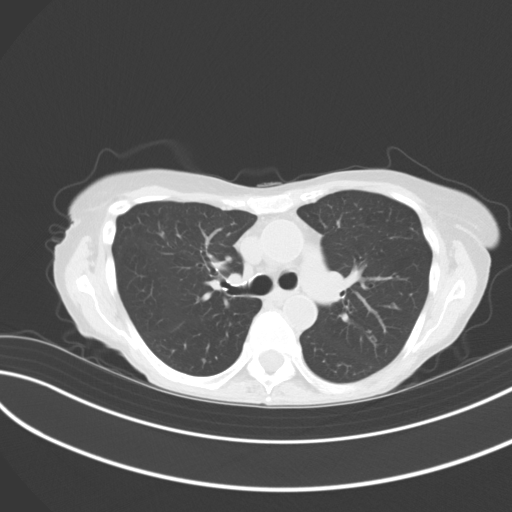
[im 118/130  lung]
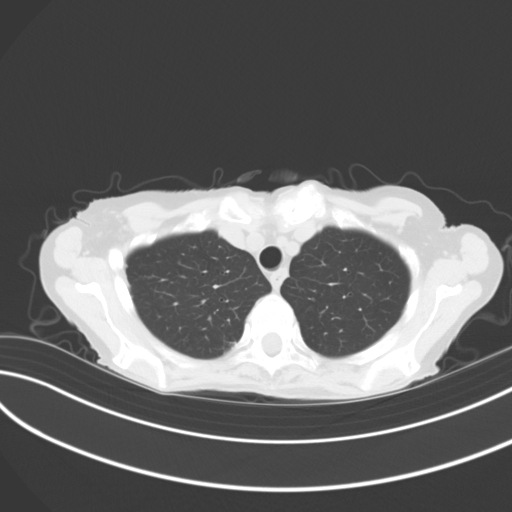

[Series 5: coronal · coronal · 0.74mm/px · 3 of 111 slices shown]
[im 23/111  lung]
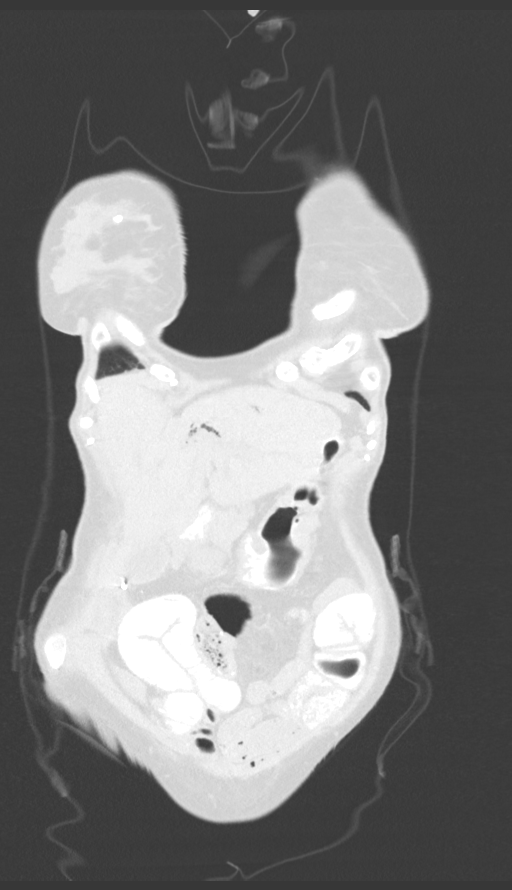
[im 45/111  lung]
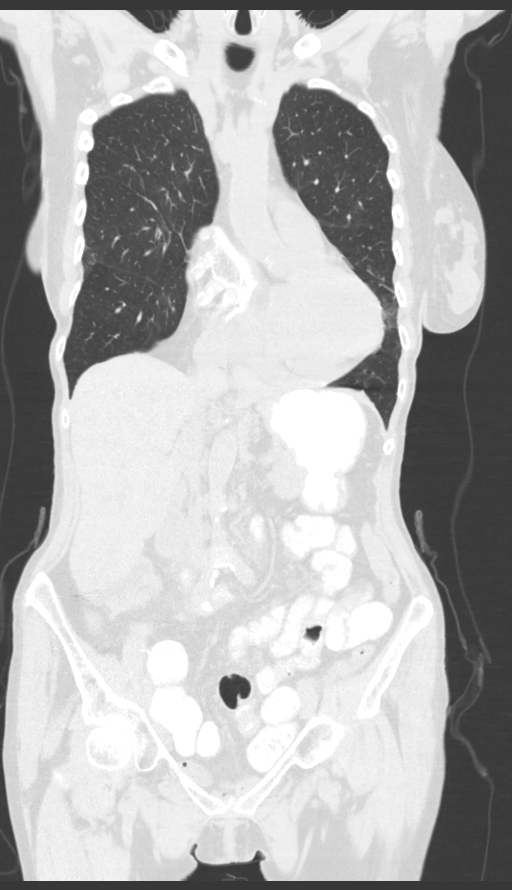
[im 67/111  lung]
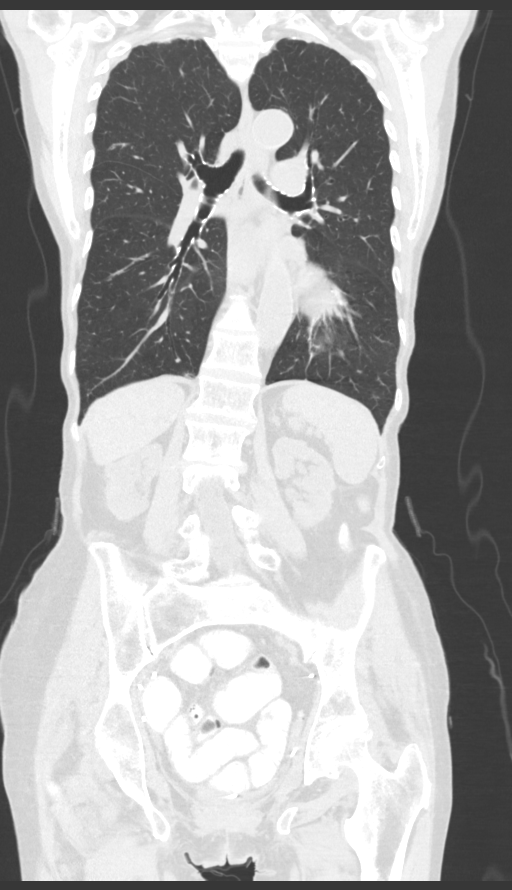

[11 of 36 positions shown; findings below may reference images not displayed]

FINDINGS: CT CHEST FINDINGS

Cardiovascular: Normal heart size. Aortic atherosclerosis. No
pericardial effusion identified.

Mediastinum/Nodes: The trachea appears patent and is midline. Normal
appearance of the esophagus. No enlarged axillary, supraclavicular,
or mediastinal adenopathy.

Lungs/Pleura: No pleural effusion identified. Tiny nonspecific
nodule identified within the lateral left lower lobe measuring 2 mm,
image 107/4. Right middle lobe lung nodule measures 4 mm, image
95/4. Cluster of tree-in-bud nodules within the posterior right
lower lobe are identified, likely postinflammatory. Scar noted
within the subpleural right lower lobe. 2 mm posterolateral right
lower lobe lung nodule noted. Within the anterior right lower lobe
there is a 3 mm nodule, image 116/4.

Musculoskeletal: Marked pectus deformity identified. T8 vertebral
hemangioma. No acute or suspicious osseous findings.

CT ABDOMEN PELVIS FINDINGS

Hepatobiliary: Within the limitations of unenhanced technique there
is no suspicious liver lesion. Pneumobilia is identified within left
lobe of liver. A common bile duct stent decompresses the previous
dilated CBD.

Pancreas: Persistent main duct dilatation identified which measures
7 mm at the level of the pancreatic head and neck. The underlying,
biopsy-proven suspicious lesion within the head of pancreas is
difficult to discern within the limitations of unenhanced technique.
Cystic lesions within the distal tail of pancreas are again noted.
The dominant cystic lesion measures 1.9 by 1.9 cm, image 68/2.

Spleen: Spleen is unremarkable.

Adrenals/Urinary Tract: Adrenal glands appear normal. Bilateral
renal atrophy and scarring identified. Similar appearance of
bilateral hydronephrosis. Status post cystectomy with right lower
quadrant diverting urostomy.

Stomach/Bowel: The stomach appears unremarkable. Proximal small
bowel loops appear normal. Increase caliber of mid small bowel loops
which measure up to 3.2 cm and contain multiple air-fluid levels.
Transition to decreased caliber distal small bowel loops noted in
the right lower quadrant of the abdomen near anastomotic suture
line, image 33/5.

Vascular/Lymphatic: Aortic atherosclerosis without aneurysm. No
abdominal or pelvic adenopathy identified.

Evaluation of upper abdominal vasculature including the portal vein,
and superior mesenteric artery is limited due to lack of intravenous
contrast material.

Reproductive: Status post hysterectomy. No adnexal masses.

Other: No free fluid or fluid collections. No convincing evidence
for peritoneal nodularity or mass.

Musculoskeletal: Scoliosis and degenerative disc disease. No acute
or suspicious osseous findings.
IMPRESSION: 1. The underlying biopsy-proven suspicious lesion within the head of
pancreas is difficult to discern secondary to limitations of
unenhanced technique. Further, assessment of involvement of the
superior mesenteric artery and portal vein is challenging due to
lack of IV contrast material. Refer to endoscopic ultrasound report
from [DATE] regarding pancreatic head mass and involvement of
surrounding vasculature.
2. No specific findings identified to suggest solid organ or nodal
metastasis within the abdomen or pelvis.
3. Small nonspecific pulmonary nodules are identified measuring up
to 4 mm. These are nonspecific but warrant attention on follow-up
imaging.
4. Similar appearance of bilateral hydronephrosis. Status post
cystectomy with right lower quadrant urinary diversion.
5. Abnormal increase caliber of the mid small bowel loops with
transition to decreased caliber distal small bowel loops in the
right lower quadrant of the abdomen near anastomotic suture line.
Findings are suspicious for at least a partial small bowel
obstruction which may be secondary to anastomotic stricture.
Clinical correlation for any signs or symptoms of bowel obstruction
is advised.
6. Aortic atherosclerosis.

Aortic Atherosclerosis ([8G]-[8G]).

These results will be called to the ordering clinician or
representative by the Radiologist Assistant, and communication
documented in the PACS or [REDACTED].

## 2019-12-10 NOTE — Progress Notes (Signed)
Initial RN Navigator Patient Visit  Name: IVIONA HOLE Date of Referral : 12/05/19 Diagnosis: Pancreatic Cancer  Met with patient prior to their visit with MD. Hanley Seamen patient "Your Patient Navigator" handout which explains my role, areas in which I am able to help, and all the contact information for myself and the office. Also gave patient MD and Navigator business card. Reviewed with patient the general overview of expected course after initial diagnosis and time frame for all steps to be completed.  New patient packet given to patient which includes: orientation to office and staff; campus directory; education on My Chart and Advance Directives; and patient centered education on pancreatic cancer.   Discussed possible genetics referral with patient. At this time she doesn't see the value in genetic testing but she'll reach out if that changes.   Patient completed visit with Dr. Marin Olp  Revisited with patient after MD visit. Patient will need  Referral to radiation oncology. Order placed.   Patient understands all follow up procedures and expectations. They have my number to reach out for any further clarification or additional needs.

## 2019-12-10 NOTE — Progress Notes (Addendum)
Referral MD  Reason for Referral: Adenocarcinoma of the pancreas -- biliary obstruction  Chief Complaint  Patient presents with  . New Patient (Initial Visit)  : I was told that I looked yellow after church 1 day.  HPI: Glenda King is an incredibly charming and very vigorous 86 year old white female.  She is originally from [place].  She is a retired Education officer, museum.  She had been living down in [place], [place].  While down in [place], she developed bladder cancer.  She went to [place], [place] back in 2003 and had a cystectomy with a urinary bladder made from her intestine.  Since then, she has never had problems with recurrent disease.  She moved up to [place] a few years ago.  She had family appear.  She lives in a retirement community.  She has done well with the coronavirus.  She has not had an exposure to the coronavirus.  She is not been vaccinated as of yet.  Back in March, she was admitted to the hospital with a bilirubin of 16.  She was thought at the time to have gallstones.  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  She had a MRCP.  This showed a dilated common bile duct.  There is some cystic lesions along the tip of the pancreatic tail.  Patient then underwent ERCP.  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  She had a stent placed.  She was then referred to Dr. Rush Landmark of gastroenterology.  He went ahead and did upper endoscopy and EUS on her.  Biopsies were taken.  This was done on 12/03/2019.  According to the operative report, a 22 x 20 mm area of abnormality was noted in the pancreatic head.  There is evidence that this abutted the superior mesenteric vein.  Biopsies were taken.  I think that the actual biopsies were negative for malignancy.  However cytology was done.  The cytology report ([id number]) showed suspicious cells for malignancy.  He was very concerning for adenocarcinoma.  She underwent a CT  scan done today.  Unfortunately, she did not have contrast.  The CT scan did not show anything that was obvious.  Again, because of lack of contrast this limited the results.  There is no obvious metastatic disease.  Glenda King comes in with her sister-in-law.  Both are very very nice.  She is eating well.  She has not lost much weight.  She is not a big woman to begin with.  She does not smoke.  She does not really drink.  There is really no history of cancer in the family.  There has been no nausea or vomiting.  She has had no diarrhea.  There is been no bleeding.  Overall, her performance status is ECOG 1.    Past Medical History:  Diagnosis Date  . Anemia   . Cancer [facility])    bladder cancer  . Cardiomyopathy ([place])   . CKD (chronic kidney disease)   . Thyroid disease    hypothyroid  :  Past Surgical History:  Procedure Laterality Date  . BILIARY STENT PLACEMENT N/A 09/05/2019   Procedure: BILIARY STENT PLACEMENT;  Surgeon: Irene Shipper, MD;  Location: [facility];  Service: Endoscopy;  Laterality: N/A;  . BIOPSY  12/03/2019   Procedure: BIOPSY;  Surgeon: Rush Landmark Telford Nab., MD;  Location: [facility];  Service: Gastroenterology;;  . ERCP N/A 09/05/2019   Procedure: ENDOSCOPIC RETROGRADE CHOLANGIOPANCREATOGRAPHY (ERCP);  Surgeon: Henrene Pastor,  Docia Chuck, MD;  Location: [facility];  Service: Endoscopy;  Laterality: N/A;  . ESOPHAGOGASTRODUODENOSCOPY (EGD) WITH PROPOFOL N/A 12/03/2019   Procedure: ESOPHAGOGASTRODUODENOSCOPY (EGD) WITH PROPOFOL;  Surgeon: Rush Landmark Telford Nab., MD;  Location[facility];  Service: Gastroenterology;  Laterality: N/A;  . EUS N/A 12/03/2019   Procedure: UPPER ENDOSCOPIC ULTRASOUND (EUS) LINEAR;  Surgeon: Irving Copas., MD;  Location: [facility];  Service: Gastroenterology;  Laterality: N/A;  . FINE NEEDLE ASPIRATION N/A 12/03/2019   Procedure: FINE NEEDLE ASPIRATION (FNA) LINEAR;  Surgeon: Irving Copas., MD;  Location[facility];  Service: Gastroenterology;  Laterality: N/A;  . SPHINCTEROTOMY  09/05/2019   Procedure: SPHINCTEROTOMY;  Surgeon: Irene Shipper, MD;  Location: [facility];  Service: Endoscopy;;  :   Current Outpatient Medications:  .  cholecalciferol (VITAMIN D3) 25 MCG (1000 UNIT) tablet, Take 1,000 Units by mouth 2 (two) times daily., Disp: , Rfl:  .  ferrous sulfate 325 (65 FE) MG tablet, Take 1 tablet (325 mg total) by mouth daily with breakfast., Disp: 30 tablet, Rfl: 3 .  levETIRAcetam (KEPPRA) 500 MG tablet, Take 500 mg by mouth 2 (two) times daily., Disp: , Rfl:  .  levothyroxine (SYNTHROID) 100 MCG tablet, Take 100 mcg by mouth daily., Disp: , Rfl:  .  Melatonin 10 MG CAPS, Take 10 mg by mouth at bedtime., Disp: , Rfl:  .  metoprolol tartrate (LOPRESSOR) 25 MG tablet, Take 12.5 mg by mouth 2 (two) times daily. , Disp: , Rfl:  .  omeprazole (PRILOSEC) 40 MG capsule, Take 1 capsule (40 mg total) by mouth daily., Disp: 30 capsule, Rfl: 3 .  sodium bicarbonate 650 MG tablet, Take 1,300 mg by mouth 2 (two) times daily., Disp: , Rfl:  .  vitamin B-12 1000 MCG tablet, Take 1 tablet (1,000 mcg total) by mouth daily., Disp: 30 tablet, Rfl: 0:  :  Allergies  Allergen Reactions  . Amoxicillin     Pt unsure of reaction     . Gadolinium Derivatives Other (See Comments)    Secondary to her stage IV kidney disease   . Pollen Extract Other (See Comments)    Sneezing  :  History reviewed. No pertinent family history.:  Social History   Socioeconomic History  . Marital status: Married    Spouse name: Not on file  . Number of children: Not on file  . Years of education: Not on file  . Highest education level: Not on file  Occupational History  . Not on file  Tobacco Use  . Smoking status: Never Smoker  . Smokeless tobacco: Never Used  Vaping Use  . Vaping Use: Never used  Substance and Sexual Activity  . Alcohol use: Not Currently  . Drug use: Not Currently  . Sexual activity:  Not on file  Other Topics Concern  . Not on file  Social History Narrative  . Not on file   Social Determinants of Health   Financial Resource Strain:   . Difficulty of Paying Living Expenses:   Food Insecurity:   . Worried About Charity fundraiser in the Last Year:   . Arboriculturist in the Last Year:   Transportation Needs:   . Film/video editor (Medical):   Marland Kitchen Lack of Transportation (Non-Medical):   Physical Activity:   . Days of Exercise per Week:   . Minutes of Exercise per Session:   Stress:   . Feeling of Stress :   Social Connections:   .  Frequency of Communication with Friends and Family:   . Frequency of Social Gatherings with Friends and Family:   . Attends Religious Services:   . Active Member of Clubs or Organizations:   . Attends Archivist Meetings:   Marland Kitchen Marital Status:   Intimate Partner Violence:   . Fear of Current or Ex-Partner:   . Emotionally Abused:   Marland Kitchen Physically Abused:   . Sexually Abused:   :  Review of Systems  Constitutional: Negative.   HENT: Negative.   Eyes: Negative.   Respiratory: Negative.   Cardiovascular: Negative.   Gastrointestinal: Negative.   Genitourinary: Negative.   Musculoskeletal: Negative.   Skin: Negative.   Neurological: Negative.   Endo/Heme/Allergies: Negative.   Psychiatric/Behavioral: Negative.      Exam:  This is a thin, elderly appearing white female in no obvious distress.  She has obvious tectum excavatum.  Vital signs are temperature 97.6.  Pulse 86.  Blood pressure 141/68.  Weight is 98 pounds.  Head and neck exam shows no scleral icterus.  She has no oral lesions.  There is no palatal icterus.  She has no adenopathy in the neck.  Lungs are clear bilaterally.  She has no rales, wheezes or rhonchi.  Cardiac exam regular rate and rhythm with no murmurs, rubs or bruits.  Abdomen is soft.  She has decent bowel sounds.  She has a ostomy site in the right lower quadrant.  There might be some  fullness in the hypoumbilical area from the bladder reconstruction.  There is no tenderness in the right upper quadrant.  There is no fluid wave.  There is no obvious liver or spleen tip.  Back exam shows no tenderness over the spine, ribs or hips.  Extremities shows some muscle atrophy in upper and lower extremities bilaterally.  She has good strength in upper and lower extremities.  She has good range of motion.  Skin exam shows no rashes, ecchymoses or petechia.  Neurological exam shows no focal neurological deficits.   @IPVITALS @   Recent Labs    12/09/19 1035 12/10/19 1216  WBC 7.5 9.5  HGB 10.3* 10.8*  HCT 31.2* 34.3*  PLT 189.0 213   Recent Labs    12/09/19 1035 12/10/19 1216  NA 138 140  K 4.6 4.8  CL 101 100  CO2 29 29  GLUCOSE 163* 114*  BUN 42* 42*  CREATININE 1.59* 1.72*  CALCIUM 9.4 10.1    Blood smear review: None  Pathology: See above    Assessment and Plan: Ms. Nored is a very charming 86 year old white female.  I would have to believe that she does have a localized pancreatic cancer.  Even though we do not have a confirmed diagnosis.  I think the cytology along with the clinical findings are highly suspicious for pancreatic cancer.  We will have to see what the CA 19-9 is.  It does not look like there is any obvious metastatic disease.  She is not a candidate for any aggressive surgical intervention.  I think a very good option for her would be stereotactic radiosurgery.  I think this would be reasonable to consider.  It certainly may not cure the malignancy but will certainly keep it under control.  I do not see a role for chemotherapy with her.  Her performance status is okay but I think that chemotherapy,which is necessary for pancreatic cancer, would be too much for her.  She values quality of life above all else.  I totally  agree with this.  I spent about an hour with she and her sister-in-law.  Her sister-in-law I think taped the  conversation we had.  I went over her reports.  Unfortunatly, the CT scan report was not available.  I did look at the actual films with Glenda King.  We will try to take care of a lot of this by phone.  Again I will see what the CA 19-9 is.  I probably will have to call Radiation Oncology and see what they have to say about all this.  ADDENDUM:  Her CA 19-9 is elevated at 255.  Other than this is fairly good evidence of pancreatic adenocarcinoma.  I spoke with Dr. Teryl Lucy of Radiation Oncology on 12/16/2019.  I gave him information on Glenda King.  He will get her in and talk to her about the use of radiation therapy for her highly likely pancreatic cancer.  I will plan to get her back to see [place] in about a month or so.  Lattie Haw, MD

## 2019-12-10 NOTE — Telephone Encounter (Signed)
The pt has been advised that she needs to pick up contrast as soon as she can this morning for the CT at 11 am.  She has been advised to pick up at [place] The pt has been advised of the information and verbalized understanding.

## 2019-12-10 NOTE — Telephone Encounter (Signed)
Pls call pt. She needs to know where she needs to pick up contrast for her imaging today.

## 2019-12-11 ENCOUNTER — Telehealth: Payer: Self-pay | Admitting: Hematology & Oncology

## 2019-12-11 LAB — CANCER ANTIGEN 19-9: CA 19-9: 255 U/mL — ABNORMAL HIGH (ref 0–35)

## 2019-12-11 NOTE — Telephone Encounter (Signed)
No los 6/30

## 2019-12-12 NOTE — Progress Notes (Signed)
Patient here for a new consult with Dr. Sondra Come.  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 New Patient (Initial Visit)  : I was told that I looked yellow after church 1 day.  HPI: Glenda King is an incredibly charming and very vigorous 86 year old white female.  She is originally from [place].  She is a retired Education officer, [employer].  She had been living down in [place], [place].  While down in [place], she developed bladder cancer.  She went to [place], [place] back in 2003 and had a cystectomy with a urinary bladder made from her intestine.  Since then, she has never had problems with recurrent disease.  She moved up to [place] a few years ago.  She had family appear.  She lives in a retirement community.  She has done well with the coronavirus.  She has not had an exposure to the coronavirus.  She is not been vaccinated as of yet.  Back in March, she was admitted to the hospital with a bilirubin of 16.  She was thought at the time to have gallstones.  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  She had a MRCP.  This showed a dilated common bile duct.  There is some cystic lesions along the tip of the pancreatic tail.  Patient then underwent ERCP.  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  She had a stent placed.  She was then referred to Dr. Rush Landmark of gastroenterology.  He went ahead and did upper endoscopy and EUS on her.  Biopsies were taken.  This was done on 12/03/2019.  According to the operative report, a 22 x 20 mm area of abnormality was noted in the pancreatic head.  There is  evidence that this abutted the superior mesenteric vein.   Biopsies were taken.  I think that the actual biopsies were negative for malignancy.  However cytology was done.  The cytology report ([id number]) showed suspicious cells for malignancy.  He was very concerning for adenocarcinoma.  She underwent a CT scan done today.  Unfortunately, she did not have contrast.  The CT scan did not show anything that was obvious.  Again, because of lack of contrast this limited the results.  There is no obvious metastatic disease.  Glenda King comes in with her sister-in-law.  Both are very very nice.  She is eating well.  She has not lost much weight.  She is not a big woman to begin with.  She does not smoke.  She does not really drink.  There is really no history of cancer in the family.  There has been no nausea or vomiting.  She has had no diarrhea.  There is been no bleeding.  Overall, her performance status is ECOG 1.  Assessment and Plan: Glenda King is a very charming 86 year old white female.  I would have to believe that she does have a localized pancreatic cancer.  Even though we do not have a confirmed diagnosis.  I think the cytology along with the clinical findings are highly suspicious for pancreatic cancer.  We will have  to see what the CA 19-9 is.  It does not look like there is any obvious metastatic disease.  She is not a candidate for any aggressive surgical intervention.  I think a very good option for her would be stereotactic radiosurgery.  I think this would be reasonable to consider.  It certainly may not cure the malignancy but will certainly keep it under control.  I do not see a role for chemotherapy with her.  Her performance status is okay but I think that chemotherapy, that is necessary for pancreatic cancer, would be too much for her.  She values quality of life above all else.  I totally agree with this.  I spent about an hour with she and  her sister-in-law.  Her sister-in-law I think taped the conversation we had.  I went over her reports.  Unfortunatly, the CT scan report was not available.  I did look at the actual films with Glenda King.  We will try to take care of a lot of this by phone.  Again I will see what the CA 9-9 is.  I probably will have to call Radiation Oncology and see with a had to say about all this.          Past/Anticipated interventions by surgeon, if any: none  Past/Anticipated interventions by medical oncology, if any: none  Weight changes, if any: lost 8-9 lbs in March and is gaining it back  Bowel/Bladder complaints, if any: no  Nausea / Vomiting, if any: no  Pain issues, if any: no  Any blood per rectum:  no  SAFETY ISSUES:  Prior radiation? unsure  Pacemaker/ICD? no  Possible current pregnancy? postmenopausal  Is the patient on methotrexate? no  Current Complaints/Details: Had a cystectomy in 2003 and unsure if had radiation then.  BP (!) 155/66 (BP Location: Left Arm, Patient Position: Sitting, Cuff Size: Normal)   Pulse 71   Temp 99 F (37.2 C)   Resp 20   Ht 5' 6.5" (1.689 m)   Wt 97 lb 12.8 oz (44.4 kg)   SpO2 96%   BMI 15.55 kg/m   Wt Readings from Last 3 Encounters:  12/17/19 97 lb 12.8 oz (44.4 kg)  12/10/19 98 lb (44.5 kg)  12/03/19 194 lb 0.1 oz (88 kg)

## 2019-12-16 ENCOUNTER — Encounter: Payer: Self-pay | Admitting: Genetic Counselor

## 2019-12-16 DIAGNOSIS — Z1379 Encounter for other screening for genetic and chromosomal anomalies: Secondary | ICD-10-CM | POA: Insufficient documentation

## 2019-12-16 NOTE — Progress Notes (Signed)
Radiation Oncology         [phone] ________________________________  Initial Outpatient Consultation  Name: Glenda King MRN: [id number]  Date: 12/17/2019  DOB: 05/12/1929  CB:SWHQPR, Christian Mate, MD  [name], MD   REFERRING PHYSICIAN: [name], MD  DIAGNOSIS: The encounter diagnosis was Primary pancreatic cancer  [facility]).  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 female who is seen as a courtesy of Dr. Marin Olp for an opinion concerning radiation therapy as part of management for her recently diagnosed adenocarcinoma of the pancreas. Today, she is accompanied by family member. The patient presented to the ED on 09/03/2019 with chief complaint of jaundice with associated generalized weakness. Chest x-ray at that time showed mild scarring of the right mid lung. Lungs elsewhere were clear. There was no adenopathy appreciated. Abdominal ultrasound showed cholelithiasis and gallbladder sludge with significant intra and extrahepatic biliary ductal dilatation, likely related to a distal common bile duct stone. There was also noted to be chronic hydronephrosis that was stable from the 2015 CT, consistent with the patient's known urinary diversion. After further workup, the patient was admitted to the hospital for further management of common bile duct stone, dilated common bile duct, renal failure, transaminitis, UTI with hematuria, and biliary obstruction.   Abdominal MRI/MRCP on 09/04/2019 showed choledocholithiasis with a cluster of small stones in the distal common bile duct that measured individually up to about 0.3 cm in diameter. The dilated common bilateral duct measured 1.4 cm in diameter. The gallbladder was noted to be distended with numerous gallstones that measured up to about 0.7 cm in diameter in addition to mild gallbladder wall thickening and pericholecystic fluid. There was  also dilation of the dorsal pancreatic duct which was new when compared to 11/08/2012. Additionally, there were noted to be several clustered cystic lesions along the tip of the pancreatic tail, the largest of which measured 1.9 x 1.6 x 1.9 cm, previously 1.2 by 1.0 x 1.1. cm on 11/08/2012. Differentials included post-inflammatory cystic lesions or intraductal mucinous neoplasm.   ERCP on 09/05/2019 performed by Dr. Henrene Pastor showed diffuse biliary dilatation secondary to potentially a focal stricture of the distal common bile duct. An endoscopic biliary stent was placed in the common bile duct. The patient was stabilized and discharged home on 09/06/2019.  The patient underwent an EGD/EUS on 12/03/2019 for evaluation of double duct sign. This was performed by Dr. Rush Landmark. EGD did not show any gross lesions in the esophagus, in the duodenal bulb, nor in the first or second portion of the duodenum. Mucosal changes at the ampulla and gastritis were both biopsied. EUS showed a hypoechoic region in the pancreatic head that appeared suspicious for potential malignancy in the setting of the double duct sign and prior presentation with painless jaundice. Fine needle biopsy was performed. There was also noted to be hyperechoic material consistent with sludge in the common bile duct as well as dilation in the common bile duct. There were no malignant-appearing lymph nodes visualized in the celiac region, peripancreatic region, or porta hepatis region.  Surgical pathology on 12/03/2019 revealed mild chronic gastritis without activity of the gastric. No H. Pylori or intestinal metaplasia was identified. The ampulla was also biopsied and showed ampullary mucosa with reactive changes, acute and chronic inflammation. There was no malignancy identified. Cytology revealed atypical cells suspicious for malignancy in the pancreatic head.  Per endoscopy ultrasound the patient was noted to  have a 2.2 x 2.0 cm abnormality within  the head of the pancreas  CT of chest/abdomen/pelvis on 12/10/2019 had difficulty discerning the underlying biopsy-proven suspicious lesion within the head of the pancreas secondary to limitation of un-enhanced technique. Further, assessment of involvement of the superior mesenteric artery and portal vein was challenging due to lack of IV contrast material. However, there were no specific findings identified to suggest solid organ or nodal metastasis within the abdomen or pelvis. There were small, non-specific pulmonary nodules that measured up to 4 mm. Additionally, there was a similar appearance of bilateral hydronephrosis; status post cystectomy with right lower quadrant urinary diversion. Finally, there was abnormal increase caliber of the mid small bowel loops with transition to decreased caliber distal small bowel loops in the right lower quadrant of the abdomen near the anastomotic suture line; suspicious for at least a partial small bowel obstruction.  The patient was referred to Dr. Marin Olp and was seen in consultation on 12/10/2019. He felt that the patient has a localized pancreatic cancer, even though there is not yet a confirmed diagnosis. There has been no obvious metastatic disease thus far. She was noted to not be a candidate for any aggressive surgical intervention and so they discussed stereotactic radiosurgery. Dr. Marin Olp did not see a role for chemotherapy given her performance status.  CA 19-9 is now elevated at 255 consistent with malignancy.  Of note, the patient has a remote history of bladder cancer in 2003. She underwent a cystectomy with a urinary bladder made from her intestine performed in [place], [place].  PREVIOUS RADIATION THERAPY: No  PAST MEDICAL HISTORY:  Past Medical History:  Diagnosis Date  . Anemia   . Cancer [facility])    bladder cancer  . Cardiomyopathy ([place])   . CKD (chronic kidney disease)   . Goals of care, counseling/discussion 12/10/2019  . Primary  pancreatic cancer  ([place]) 12/10/2019  . Thyroid disease    hypothyroid    PAST SURGICAL HISTORY: Past Surgical History:  Procedure Laterality Date  . BILIARY STENT PLACEMENT N/A 09/05/2019   Procedure: BILIARY STENT PLACEMENT;  Surgeon: Irene Shipper, MD;  Location: [facility] ENDOSCOPY;  Service: Endoscopy;  Laterality: N/A;  . BIOPSY  12/03/2019   Procedure: BIOPSY;  Surgeon: Rush Landmark Telford Nab., MD;  Location: [facility] ENDOSCOPY;  Service: Gastroenterology;;  . ERCP N/A 09/05/2019   Procedure: ENDOSCOPIC RETROGRADE CHOLANGIOPANCREATOGRAPHY (ERCP);  Surgeon: Irene Shipper, MD;  Location: [facility];  Service: Endoscopy;  Laterality: N/A;  . ESOPHAGOGASTRODUODENOSCOPY (EGD) WITH PROPOFOL N/A 12/03/2019   Procedure: ESOPHAGOGASTRODUODENOSCOPY (EGD) WITH PROPOFOL;  Surgeon: Rush Landmark Telford Nab., MD;  Location: [facility] ENDOSCOPY;  Service: Gastroenterology;  Laterality: N/A;  . EUS N/A 12/03/2019   Procedure: UPPER ENDOSCOPIC ULTRASOUND (EUS) LINEAR;  Surgeon: Irving Copas., MD;  Location: [facility] ENDOSCOPY;  Service: Gastroenterology;  Laterality: N/A;  . FINE NEEDLE ASPIRATION N/A 12/03/2019   Procedure: FINE NEEDLE ASPIRATION (FNA) LINEAR;  Surgeon: Irving Copas., MD;  Location: [facility] ENDOSCOPY;  Service: Gastroenterology;  Laterality: N/A;  . SPHINCTEROTOMY  09/05/2019   Procedure: SPHINCTEROTOMY;  Surgeon: Irene Shipper, MD;  Location: [facility];  Service: Endoscopy;;    FAMILY HISTORY: History reviewed. No pertinent family history.  SOCIAL HISTORY:  Social History   Tobacco Use  . Smoking status: Never Smoker  . Smokeless tobacco: Never Used  Vaping Use  . Vaping Use: Never used  Substance Use Topics  . Alcohol use: Not Currently  . Drug use: Not Currently  ALLERGIES:  Allergies  Allergen Reactions  . Amoxicillin     Pt unsure of reaction     . Gadolinium Derivatives Other (See Comments)    Secondary to her stage IV kidney disease   . Pollen Extract Other (See  Comments)    Sneezing    MEDICATIONS:  Current Outpatient Medications  Medication Sig Dispense Refill  . cholecalciferol (VITAMIN D3) 25 MCG (1000 UNIT) tablet Take 1,000 Units by mouth 2 (two) times daily.    . ferrous sulfate 325 (65 FE) MG tablet Take 1 tablet (325 mg total) by mouth daily with breakfast. 30 tablet 3  . levETIRAcetam (KEPPRA) 500 MG tablet Take 500 mg by mouth 2 (two) times daily.    Marland Kitchen levothyroxine (SYNTHROID) 100 MCG tablet Take 100 mcg by mouth daily.    . Melatonin 10 MG CAPS Take 10 mg by mouth at bedtime.    . metoprolol tartrate (LOPRESSOR) 25 MG tablet Take 12.5 mg by mouth 2 (two) times daily.     Marland Kitchen omeprazole (PRILOSEC) 40 MG capsule Take 1 capsule (40 mg total) by mouth daily. 30 capsule 3  . sodium bicarbonate 650 MG tablet Take 1,300 mg by mouth 2 (two) times daily.    . vitamin B-12 1000 MCG tablet Take 1 tablet (1,000 mcg total) by mouth daily. 30 tablet 0   No current facility-administered medications for this encounter.    REVIEW OF SYSTEMS:  A 10+ POINT REVIEW OF SYSTEMS WAS OBTAINED including neurology, dermatology, psychiatry, cardiac, respiratory, lymph, extremities, GI, GU, musculoskeletal, constitutional, reproductive, HEENT.  Denies any abdominal pain or problems with itching.   PHYSICAL EXAM:  height is 5' 6.5" (1.689 m) and weight is 97 lb 12.8 oz (44.4 kg). Her temperature is 99 F (37.2 C). Her blood pressure is 155/66 (abnormal) and her pulse is 71. Her respiration is 20 and oxygen saturation is 96%.   General: Alert and oriented, in no acute distress HEENT: Head is normocephalic. Extraocular movements are intact.  Neck: Neck is supple, no palpable cervical or supraclavicular lymphadenopathy. Heart: Regular in rate and rhythm with no murmurs, rubs, or gallops. Chest: Clear to auscultation bilaterally, with no rhonchi, wheezes, or rales. Abdomen: Soft, nontender, nondistended, with no rigidity or guarding.  No palpable epigastric mass, no  gastric tenderness Extremities: No cyanosis or edema. Lymphatics: see Neck Exam Skin: No concerning lesions. Musculoskeletal: symmetric strength and muscle tone throughout. Neurologic: Cranial nerves II through XII are grossly intact. No obvious focalities. Speech is fluent. Coordination is intact. Psychiatric: Judgment and insight are intact. Affect is appropriate.   ECOG = 1  0 - Asymptomatic (Fully active, able to carry on all predisease activities without restriction)  1 - Symptomatic but completely ambulatory (Restricted in physically strenuous activity but ambulatory and able to carry out work of a light or sedentary nature. For example, light housework, office work)  2 - Symptomatic, <50% in bed during the day (Ambulatory and capable of all self care but unable to carry out any work activities. Up and about more than 50% of waking hours)  3 - Symptomatic, >50% in bed, but not bedbound (Capable of only limited self-care, confined to bed or chair 50% or more of waking hours)  4 - Bedbound (Completely disabled. Cannot carry on any self-care. Totally confined to bed or chair)  5 - Death   Eustace Pen MM, Creech RH, Tormey DC, et al. [phone]). "Toxicity and response criteria of the [facility]". [place].  5 (6): 649-55  LABORATORY DATA:  Lab Results  Component Value Date   WBC 9.5 12/10/2019   HGB 10.8 (L) 12/10/2019   HCT 34.3 (L) 12/10/2019   MCV 99.1 12/10/2019   PLT 213 12/10/2019   NEUTROABS 7.7 12/10/2019   Lab Results  Component Value Date   NA 140 12/10/2019   K 4.8 12/10/2019   CL 100 12/10/2019   CO2 29 12/10/2019   GLUCOSE 114 (H) 12/10/2019   CREATININE 1.72 (H) 12/10/2019   CALCIUM 10.1 12/10/2019      RADIOGRAPHY: CT ABDOMEN PELVIS WO CONTRAST  Result Date: 12/10/2019 CLINICAL DATA:  Staging adenocarcinoma of the pancreas. EXAM: CT CHEST, ABDOMEN AND PELVIS WITHOUT CONTRAST TECHNIQUE: Multidetector CT imaging of the chest,  abdomen and pelvis was performed following the standard protocol without IV contrast. COMPARISON:  MRI/MRCP 09/04/2019 FINDINGS: CT CHEST FINDINGS Cardiovascular: Normal heart size. Aortic atherosclerosis. No pericardial effusion identified. Mediastinum/Nodes: The trachea appears patent and is midline. Normal appearance of the esophagus. No enlarged axillary, supraclavicular, or mediastinal adenopathy. Lungs/Pleura: No pleural effusion identified. Tiny nonspecific nodule identified within the lateral left lower lobe measuring 2 mm, image 107/4. Right middle lobe lung nodule measures 4 mm, image 95/4. Cluster of tree-in-bud nodules within the posterior right lower lobe are identified, likely postinflammatory. Scar noted within the subpleural right lower lobe. 2 mm posterolateral right lower lobe lung nodule noted. Within the anterior right lower lobe there is a 3 mm nodule, image 116/4. Musculoskeletal: Marked pectus deformity identified. T8 vertebral hemangioma. No acute or suspicious osseous findings. CT ABDOMEN PELVIS FINDINGS Hepatobiliary: Within the limitations of unenhanced technique there is no suspicious liver lesion. Pneumobilia is identified within left lobe of liver. A common bile duct stent decompresses the previous dilated CBD. Pancreas: Persistent main duct dilatation identified which measures 7 mm at the level of the pancreatic head and neck. The underlying, biopsy-proven suspicious lesion within the head of pancreas is difficult to discern within the limitations of unenhanced technique. Cystic lesions within the distal tail of pancreas are again noted. The dominant cystic lesion measures 1.9 by 1.9 cm, image 68/2. Spleen: Spleen is unremarkable. Adrenals/Urinary Tract: Adrenal glands appear normal. Bilateral renal atrophy and scarring identified. Similar appearance of bilateral hydronephrosis. Status post cystectomy with right lower quadrant diverting urostomy. Stomach/Bowel: The stomach appears  unremarkable. Proximal small bowel loops appear normal. Increase caliber of mid small bowel loops which measure up to 3.2 cm and contain multiple air-fluid levels. Transition to decreased caliber distal small bowel loops noted in the right lower quadrant of the abdomen near anastomotic suture line, image 33/5. Vascular/Lymphatic: Aortic atherosclerosis without aneurysm. No abdominal or pelvic adenopathy identified. Evaluation of upper abdominal vasculature including the portal vein, and superior mesenteric artery is limited due to lack of intravenous contrast material. Reproductive: Status post hysterectomy. No adnexal masses. Other: No free fluid or fluid collections. No convincing evidence for peritoneal nodularity or mass. Musculoskeletal: Scoliosis and degenerative disc disease. No acute or suspicious osseous findings. IMPRESSION: 1. The underlying biopsy-proven suspicious lesion within the head of pancreas is difficult to discern secondary to limitations of unenhanced technique. Further, assessment of involvement of the superior mesenteric artery and portal vein is challenging due to lack of IV contrast material. Refer to endoscopic ultrasound report from 12/03/2019 regarding pancreatic head mass and involvement of surrounding vasculature. 2. No specific findings identified to suggest solid organ or nodal metastasis within the abdomen or pelvis. 3. Small nonspecific pulmonary nodules are identified measuring up to  4 mm. These are nonspecific but warrant attention on follow-up imaging. 4. Similar appearance of bilateral hydronephrosis. Status post cystectomy with right lower quadrant urinary diversion. 5. Abnormal increase caliber of the mid small bowel loops with transition to decreased caliber distal small bowel loops in the right lower quadrant of the abdomen near anastomotic suture line. Findings are suspicious for at least a partial small bowel obstruction which may be secondary to anastomotic stricture.  Clinical correlation for any signs or symptoms of bowel obstruction is advised. 6. Aortic atherosclerosis. Aortic Atherosclerosis (ICD10-I70.0). These results will be called to the ordering clinician or representative by the Radiologist Assistant, and communication documented in the PACS or [employer]. Electronically Signed   By: Kerby Moors M.D.   On: 12/10/2019 15:49   CT CHEST WO CONTRAST  Result Date: 12/10/2019 CLINICAL DATA:  Staging adenocarcinoma of the pancreas. EXAM: CT CHEST, ABDOMEN AND PELVIS WITHOUT CONTRAST TECHNIQUE: Multidetector CT imaging of the chest, abdomen and pelvis was performed following the standard protocol without IV contrast. COMPARISON:  MRI/MRCP 09/04/2019 FINDINGS: CT CHEST FINDINGS Cardiovascular: Normal heart size. Aortic atherosclerosis. No pericardial effusion identified. Mediastinum/Nodes: The trachea appears patent and is midline. Normal appearance of the esophagus. No enlarged axillary, supraclavicular, or mediastinal adenopathy. Lungs/Pleura: No pleural effusion identified. Tiny nonspecific nodule identified within the lateral left lower lobe measuring 2 mm, image 107/4. Right middle lobe lung nodule measures 4 mm, image 95/4. Cluster of tree-in-bud nodules within the posterior right lower lobe are identified, likely postinflammatory. Scar noted within the subpleural right lower lobe. 2 mm posterolateral right lower lobe lung nodule noted. Within the anterior right lower lobe there is a 3 mm nodule, image 116/4. Musculoskeletal: Marked pectus deformity identified. T8 vertebral hemangioma. No acute or suspicious osseous findings. CT ABDOMEN PELVIS FINDINGS Hepatobiliary: Within the limitations of unenhanced technique there is no suspicious liver lesion. Pneumobilia is identified within left lobe of liver. A common bile duct stent decompresses the previous dilated CBD. Pancreas: Persistent main duct dilatation identified which measures 7 mm at the level of the  pancreatic head and neck. The underlying, biopsy-proven suspicious lesion within the head of pancreas is difficult to discern within the limitations of unenhanced technique. Cystic lesions within the distal tail of pancreas are again noted. The dominant cystic lesion measures 1.9 by 1.9 cm, image 68/2. Spleen: Spleen is unremarkable. Adrenals/Urinary Tract: Adrenal glands appear normal. Bilateral renal atrophy and scarring identified. Similar appearance of bilateral hydronephrosis. Status post cystectomy with right lower quadrant diverting urostomy. Stomach/Bowel: The stomach appears unremarkable. Proximal small bowel loops appear normal. Increase caliber of mid small bowel loops which measure up to 3.2 cm and contain multiple air-fluid levels. Transition to decreased caliber distal small bowel loops noted in the right lower quadrant of the abdomen near anastomotic suture line, image 33/5. Vascular/Lymphatic: Aortic atherosclerosis without aneurysm. No abdominal or pelvic adenopathy identified. Evaluation of upper abdominal vasculature including the portal vein, and superior mesenteric artery is limited due to lack of intravenous contrast material. Reproductive: Status post hysterectomy. No adnexal masses. Other: No free fluid or fluid collections. No convincing evidence for peritoneal nodularity or mass. Musculoskeletal: Scoliosis and degenerative disc disease. No acute or suspicious osseous findings. IMPRESSION: 1. The underlying biopsy-proven suspicious lesion within the head of pancreas is difficult to discern secondary to limitations of unenhanced technique. Further, assessment of involvement of the superior mesenteric artery and portal vein is challenging due to lack of IV contrast material. Refer to endoscopic ultrasound report from 12/03/2019  regarding pancreatic head mass and involvement of surrounding vasculature. 2. No specific findings identified to suggest solid organ or nodal metastasis within the  abdomen or pelvis. 3. Small nonspecific pulmonary nodules are identified measuring up to 4 mm. These are nonspecific but warrant attention on follow-up imaging. 4. Similar appearance of bilateral hydronephrosis. Status post cystectomy with right lower quadrant urinary diversion. 5. Abnormal increase caliber of the mid small bowel loops with transition to decreased caliber distal small bowel loops in the right lower quadrant of the abdomen near anastomotic suture line. Findings are suspicious for at least a partial small bowel obstruction which may be secondary to anastomotic stricture. Clinical correlation for any signs or symptoms of bowel obstruction is advised. 6. Aortic atherosclerosis. Aortic Atherosclerosis (ICD10-I70.0). These results will be called to the ordering clinician or representative by the Radiologist Assistant, and communication documented in the PACS or [employer]. Electronically Signed   By: Kerby Moors M.D.   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  I discussed the patient that her pathology is very suspicious for malignancy but not diagnostic.  Given the above findings concern is for early stage head of pancreas malignancy.  Given the patient's age and other medical issues she is not a surgical candidate.  Patient is comfortable proceeding with radiation therapy given the above findings.  We discussed watchful waiting she is not comfortable with this approach.  Today, I talked to the patient  about the findings and work-up thus far.  We discussed the natural history of pancreatic cancer and general treatment, highlighting the role of radiotherapy in the management.  We discussed the available radiation techniques, and focused on the details of logistics and delivery.  We  reviewed the anticipated acute and late sequelae associated with radiation in this setting.  The patient was encouraged to ask questions that I answered to the best of my ability.  A patient consent form was discussed and signed.  We retained a copy for our records.  The patient would like to proceed with radiation and will be scheduled for CT simulation.  PLAN: Patient will return early next week for CT simulation.  She will be planned in the stereotactic body radiation therapy configuration with 5-6 planned treatments.  Total time spent in this encounter was 65 minutes which included reviewing the patient's most recent ED visit, hospitalization, CT scans, MRI, ultrasound, procedures, labs and pathology/cytology reports, consultations, physical examination, and documentation.   ------------------------------------------------  Blair Promise, PhD, MD  This document serves as a record of services personally performed by Gery Pray, MD. It was created on his behalf by Clerance Lav, a trained medical scribe. The creation of this record is based on the scribe's personal observations and the provider's statements to them. This document has been checked and approved by the attending provider.

## 2019-12-16 NOTE — Addendum Note (Signed)
Addended by: Burney Gauze R on: 12/16/2019 04:58 PM   Modules accepted: Orders

## 2019-12-17 ENCOUNTER — Ambulatory Visit
Admission: RE | Admit: 2019-12-17 | Discharge: 2019-12-17 | Disposition: A | Discharge status: Home / Self Care | Payer: Medicare Other | Source: Ambulatory Visit | Attending: Radiation Oncology | Admitting: Radiation Oncology

## 2019-12-17 ENCOUNTER — Encounter: Payer: Self-pay | Admitting: Radiation Oncology

## 2019-12-17 ENCOUNTER — Other Ambulatory Visit: Payer: Self-pay

## 2019-12-17 DIAGNOSIS — I429 Cardiomyopathy, unspecified: Secondary | ICD-10-CM | POA: Insufficient documentation

## 2019-12-17 DIAGNOSIS — J984 Other disorders of lung: Secondary | ICD-10-CM | POA: Diagnosis not present

## 2019-12-17 DIAGNOSIS — Z8551 Personal history of malignant neoplasm of bladder: Secondary | ICD-10-CM | POA: Diagnosis not present

## 2019-12-17 DIAGNOSIS — R918 Other nonspecific abnormal finding of lung field: Secondary | ICD-10-CM | POA: Diagnosis not present

## 2019-12-17 DIAGNOSIS — D649 Anemia, unspecified: Secondary | ICD-10-CM | POA: Insufficient documentation

## 2019-12-17 DIAGNOSIS — K2950 Unspecified chronic gastritis without bleeding: Secondary | ICD-10-CM | POA: Diagnosis not present

## 2019-12-17 DIAGNOSIS — C259 Malignant neoplasm of pancreas, unspecified: Secondary | ICD-10-CM | POA: Diagnosis not present

## 2019-12-17 DIAGNOSIS — I700 Atherosclerosis of aorta: Secondary | ICD-10-CM | POA: Insufficient documentation

## 2019-12-17 DIAGNOSIS — N1330 Unspecified hydronephrosis: Secondary | ICD-10-CM | POA: Insufficient documentation

## 2019-12-17 DIAGNOSIS — N189 Chronic kidney disease, unspecified: Secondary | ICD-10-CM | POA: Diagnosis not present

## 2019-12-17 DIAGNOSIS — E039 Hypothyroidism, unspecified: Secondary | ICD-10-CM | POA: Diagnosis not present

## 2019-12-17 DIAGNOSIS — Z79899 Other long term (current) drug therapy: Secondary | ICD-10-CM | POA: Insufficient documentation

## 2019-12-22 ENCOUNTER — Encounter: Payer: Self-pay | Admitting: *Deleted

## 2019-12-22 ENCOUNTER — Telehealth: Payer: Self-pay | Admitting: Hematology & Oncology

## 2019-12-22 NOTE — Progress Notes (Signed)
Patient has been seen by RadOnc and will begin radiation on 12/31/2019. Message sent to schedulers for follow up appointment with Dr Marin Olp in approx one month.    Oncology Nurse Navigator Documentation  Oncology Nurse Navigator Flowsheets 12/22/2019  Abnormal Finding Date -  Diagnosis Status -  Planned Course of Treatment Radiation  Phase of Treatment Radiation  Radiation Actual Start Date: 12/31/2019  Radiation Expected End Date: 01/09/2020  Navigator Follow Up Date: 01/19/2020  Navigator Follow Up Reason: Follow-up Appointment  Navigator Location [facility]  Referral Date to RadOnc/MedOnc -  Navigator Encounter Type Appt/Treatment Plan Review  Patient Visit Type MedOnc  Treatment Phase Pre-Tx/Tx Discussion  Barriers/Navigation Needs Coordination of Care  Education -  Interventions Coordination of Care  Acuity Level 2-Minimal Needs (1-2 Barriers Identified)  Referrals -  Coordination of Care Appts  Education Method -  Support Groups/Services Friends and Family  Time Spent with Patient 30

## 2019-12-22 NOTE — Telephone Encounter (Signed)
lmom to inform patient of 8/9 appt at 1 pm per 7/12 sch msg

## 2019-12-24 ENCOUNTER — Other Ambulatory Visit: Payer: Self-pay

## 2019-12-24 ENCOUNTER — Ambulatory Visit
Admission: RE | Admit: 2019-12-24 | Discharge: 2019-12-24 | Disposition: A | Discharge status: Home / Self Care | Payer: Medicare Other | Source: Ambulatory Visit | Attending: Radiation Oncology | Admitting: Radiation Oncology

## 2019-12-24 DIAGNOSIS — D490 Neoplasm of unspecified behavior of digestive system: Secondary | ICD-10-CM | POA: Diagnosis present

## 2019-12-24 DIAGNOSIS — Z510 Encounter for antineoplastic radiation therapy: Secondary | ICD-10-CM | POA: Diagnosis present

## 2019-12-30 DIAGNOSIS — Z510 Encounter for antineoplastic radiation therapy: Secondary | ICD-10-CM | POA: Diagnosis not present

## 2019-12-30 NOTE — Progress Notes (Signed)
  Radiation Oncology         [phone] ________________________________  Name: MARALEE HIGUCHI MRN: [id number]  Date: 12/31/2019  DOB: 08/08/1928  Stereotactic Body Radiotherapy Treatment Procedure Note  NARRATIVE:  SALIMATA CHRISTENSON was brought to the stereotactic radiation treatment machine and placed supine on the CT couch. The patient was set up for stereotactic body radiotherapy on the body fix pillow.  3D TREATMENT PLANNING AND DOSIMETRY:  The patient's radiation plan was reviewed and approved prior to starting treatment.  It showed 3-dimensional radiation distributions overlaid onto the planning CT.  The [facility] for the target structures as well as the organs at risk were reviewed. The documentation of this is filed in the radiation oncology EMR.  SIMULATION VERIFICATION:  The patient underwent CT imaging on the treatment unit.  These were carefully aligned to document that the ablative radiation dose would cover the target volume and maximally spare the nearby organs at risk according to the planned distribution.  SPECIAL TREATMENT PROCEDURE: LANNETTE AVELLINO received high dose ablative stereotactic body radiotherapy to the planned target volume without unforeseen complications. Treatment was delivered uneventfully. The high doses associated with stereotactic body radiotherapy and the significant potential risks require careful treatment set up and patient monitoring constituting a special treatment procedure   STEREOTACTIC TREATMENT MANAGEMENT:  Following delivery, the patient was evaluated clinically. The patient tolerated treatment without significant acute effects, and was discharged to home in stable condition.    PLAN: Continue treatment as planned.  ________________________________  Blair Promise, PhD, MD  This document serves as a record of services personally performed by Gery Pray, MD. It was created on his behalf by Clerance Lav, a trained medical scribe.  The creation of this record is based on the scribe's personal observations and the provider's statements to them. This document has been checked and approved by the attending provider.

## 2019-12-31 ENCOUNTER — Ambulatory Visit
Admission: RE | Admit: 2019-12-31 | Discharge: 2019-12-31 | Disposition: A | Discharge status: Home / Self Care | Payer: Medicare Other | Source: Ambulatory Visit | Attending: Radiation Oncology | Admitting: Radiation Oncology

## 2019-12-31 ENCOUNTER — Other Ambulatory Visit: Payer: Self-pay

## 2019-12-31 DIAGNOSIS — C259 Malignant neoplasm of pancreas, unspecified: Secondary | ICD-10-CM

## 2019-12-31 DIAGNOSIS — Z510 Encounter for antineoplastic radiation therapy: Secondary | ICD-10-CM | POA: Diagnosis not present

## 2019-12-31 NOTE — Progress Notes (Signed)
  Radiation Oncology         ([phone]) [phone] ________________________________  Name: Glenda King MRN: [id number]  Date: 01/05/2020  DOB: [birth year]  Stereotactic Body Radiotherapy Treatment Procedure Note  NARRATIVE:  Glenda King was brought to the stereotactic radiation treatment machine and placed supine on the CT couch. The patient was set up for stereotactic body radiotherapy on the body fix pillow.  3D TREATMENT PLANNING AND DOSIMETRY:  The patient's radiation plan was reviewed and approved prior to starting treatment.  It showed 3-dimensional radiation distributions overlaid onto the planning CT.  The [facility] for the target structures as well as the organs at risk were reviewed. The documentation of this is filed in the radiation oncology EMR.  SIMULATION VERIFICATION:  The patient underwent CT imaging on the treatment unit.  These were carefully aligned to document that the ablative radiation dose would cover the target volume and maximally spare the nearby organs at risk according to the planned distribution.  SPECIAL TREATMENT PROCEDURE: Glenda King received high dose ablative stereotactic body radiotherapy to the planned target volume without unforeseen complications. Treatment was delivered uneventfully. The high doses associated with stereotactic body radiotherapy and the significant potential risks require careful treatment set up and patient monitoring constituting a special treatment procedure   STEREOTACTIC TREATMENT MANAGEMENT:  Following delivery, the patient was evaluated clinically. The patient tolerated treatment without significant acute effects, and was discharged to home in stable condition.    PLAN: Continue treatment as planned.  ________________________________  Blair Promise, PhD, MD  This document serves as a record of services personally performed by Gery Pray, MD. It was created on his behalf by Clerance Lav, a trained medical scribe.  The creation of this record is based on the scribe's personal observations and the provider's statements to them. This document has been checked and approved by the attending provider.

## 2019-12-31 NOTE — Progress Notes (Signed)
  Radiation Oncology         [phone]) [phone] ________________________________  Name: Glenda King MRN: [id number]  Date: 01/01/2020  DOB: 27-May-1929  Stereotactic Body Radiotherapy Treatment Procedure Note  NARRATIVE:  Glenda King was brought to the stereotactic radiation treatment machine and placed supine on the CT couch. The patient was set up for stereotactic body radiotherapy on the body fix pillow.  3D TREATMENT PLANNING AND DOSIMETRY:  The patient's radiation plan was reviewed and approved prior to starting treatment.  It showed 3-dimensional radiation distributions overlaid onto the planning CT.  The [facility] for the target structures as well as the organs at risk were reviewed. The documentation of this is filed in the radiation oncology EMR.  SIMULATION VERIFICATION:  The patient underwent CT imaging on the treatment unit.  These were carefully aligned to document that the ablative radiation dose would cover the target volume and maximally spare the nearby organs at risk according to the planned distribution.  SPECIAL TREATMENT PROCEDURE: Glenda King received high dose ablative stereotactic body radiotherapy to the planned target volume without unforeseen complications. Treatment was delivered uneventfully. The high doses associated with stereotactic body radiotherapy and the significant potential risks require careful treatment set up and patient monitoring constituting a special treatment procedure   STEREOTACTIC TREATMENT MANAGEMENT:  Following delivery, the patient was evaluated clinically. The patient tolerated treatment without significant acute effects, and was discharged to home in stable condition.    PLAN: Continue treatment as planned.  ________________________________  Blair Promise, PhD, MD  This document serves as a record of services personally performed by Gery Pray, MD. It was created on his behalf by Clerance Lav, a trained medical scribe.  The creation of this record is based on the scribe's personal observations and the provider's statements to them. This document has been checked and approved by the attending provider.

## 2020-01-01 ENCOUNTER — Ambulatory Visit
Admission: RE | Admit: 2020-01-01 | Discharge: 2020-01-01 | Disposition: A | Discharge status: Home / Self Care | Payer: Medicare Other | Source: Ambulatory Visit | Attending: Radiation Oncology | Admitting: Radiation Oncology

## 2020-01-01 ENCOUNTER — Other Ambulatory Visit: Payer: Self-pay

## 2020-01-01 DIAGNOSIS — C259 Malignant neoplasm of pancreas, unspecified: Secondary | ICD-10-CM

## 2020-01-01 DIAGNOSIS — Z510 Encounter for antineoplastic radiation therapy: Secondary | ICD-10-CM | POA: Diagnosis not present

## 2020-01-02 ENCOUNTER — Other Ambulatory Visit: Payer: Self-pay

## 2020-01-02 ENCOUNTER — Ambulatory Visit: Payer: Medicare Other | Admitting: Radiation Oncology

## 2020-01-02 ENCOUNTER — Ambulatory Visit
Admission: RE | Admit: 2020-01-02 | Discharge: 2020-01-02 | Disposition: A | Discharge status: Home / Self Care | Payer: Medicare Other | Source: Ambulatory Visit | Attending: Radiation Oncology | Admitting: Radiation Oncology

## 2020-01-04 NOTE — Progress Notes (Signed)
°  Radiation Oncology         [phone] ________________________________  Name: Glenda King MRN: [id number]  Date: 01/06/2020  DOB: 1928-12-29  Stereotactic Body Radiotherapy Treatment Procedure Note  NARRATIVE:  JEYLA BULGER was brought to the stereotactic radiation treatment machine and placed supine on the CT couch. The patient was set up for stereotactic body radiotherapy on the body fix pillow.  3D TREATMENT PLANNING AND DOSIMETRY:  The patient's radiation plan was reviewed and approved prior to starting treatment.  It showed 3-dimensional radiation distributions overlaid onto the planning CT.  The [facility] for the target structures as well as the organs at risk were reviewed. The documentation of this is filed in the radiation oncology EMR.  SIMULATION VERIFICATION:  The patient underwent CT imaging on the treatment unit.  These were carefully aligned to document that the ablative radiation dose would cover the target volume and maximally spare the nearby organs at risk according to the planned distribution.  SPECIAL TREATMENT PROCEDURE: ALYSSE RATHE received high dose ablative stereotactic body radiotherapy to the planned target volume without unforeseen complications. Treatment was delivered uneventfully. The high doses associated with stereotactic body radiotherapy and the significant potential risks require careful treatment set up and patient monitoring constituting a special treatment procedure   STEREOTACTIC TREATMENT MANAGEMENT:  Following delivery, the patient was evaluated clinically. The patient tolerated treatment without significant acute effects, and was discharged to home in stable condition.    PLAN: Continue treatment as planned.  ________________________________  Blair Promise, PhD, MD  This document serves as a record of services personally performed by Gery Pray, MD. It was created on his behalf by Clerance Lav, a trained medical scribe.  The creation of this record is based on the scribe's personal observations and the provider's statements to them. This document has been checked and approved by the attending provider.

## 2020-01-05 ENCOUNTER — Other Ambulatory Visit: Payer: Self-pay

## 2020-01-05 ENCOUNTER — Ambulatory Visit: Payer: Medicare Other | Admitting: Radiation Oncology

## 2020-01-05 ENCOUNTER — Ambulatory Visit
Admission: RE | Admit: 2020-01-05 | Discharge: 2020-01-05 | Disposition: A | Discharge status: Home / Self Care | Payer: Medicare Other | Source: Ambulatory Visit | Attending: Radiation Oncology | Admitting: Radiation Oncology

## 2020-01-05 DIAGNOSIS — Z510 Encounter for antineoplastic radiation therapy: Secondary | ICD-10-CM | POA: Diagnosis not present

## 2020-01-05 DIAGNOSIS — C259 Malignant neoplasm of pancreas, unspecified: Secondary | ICD-10-CM

## 2020-01-06 ENCOUNTER — Ambulatory Visit
Admission: RE | Admit: 2020-01-06 | Discharge: 2020-01-06 | Disposition: A | Discharge status: Home / Self Care | Payer: Medicare Other | Source: Ambulatory Visit | Attending: Radiation Oncology | Admitting: Radiation Oncology

## 2020-01-06 ENCOUNTER — Other Ambulatory Visit: Payer: Self-pay

## 2020-01-06 DIAGNOSIS — C259 Malignant neoplasm of pancreas, unspecified: Secondary | ICD-10-CM

## 2020-01-06 DIAGNOSIS — Z510 Encounter for antineoplastic radiation therapy: Secondary | ICD-10-CM | POA: Diagnosis not present

## 2020-01-07 ENCOUNTER — Other Ambulatory Visit: Payer: Self-pay | Admitting: Radiation Oncology

## 2020-01-07 ENCOUNTER — Ambulatory Visit: Payer: Medicare Other | Admitting: Radiation Oncology

## 2020-01-07 ENCOUNTER — Ambulatory Visit
Admission: RE | Admit: 2020-01-07 | Discharge: 2020-01-07 | Disposition: A | Discharge status: Home / Self Care | Payer: Medicare Other | Source: Ambulatory Visit | Attending: Radiation Oncology | Admitting: Radiation Oncology

## 2020-01-07 DIAGNOSIS — C259 Malignant neoplasm of pancreas, unspecified: Secondary | ICD-10-CM

## 2020-01-08 ENCOUNTER — Ambulatory Visit: Payer: Medicare Other | Admitting: Radiation Oncology

## 2020-01-08 ENCOUNTER — Telehealth: Payer: Self-pay | Admitting: Internal Medicine

## 2020-01-08 ENCOUNTER — Encounter: Payer: Self-pay | Admitting: *Deleted

## 2020-01-08 NOTE — Telephone Encounter (Signed)
Pt is requesting a call back from a nurse, pt states she has a tumor on her pancreas and needs a stent to be placed, pt would like to be seen before Sept for an appt with Dr Henrene Pastor or a PA, nothing available

## 2020-01-08 NOTE — Progress Notes (Signed)
Patient calls concerned about timing of upcoming procedure. She states she received 4 radiation treatments but due to positioning of the radiation field, Dr Sondra Come had told her that he wanted her to get a stent placed via endoscopy prior to proceeding with treatment.   She received a call today to schedule a consultation with the MD which would be scheduled into September. She requested an earlier time, but was told nothing was available. She is upset in thinking that she will go so long in between radiation appointments and is worried about the implications on her cancer. She would like the procedure done by Dr Henrene Pastor or Dr Rush Landmark.   Patient would like the consultation and endoscopy completed ASAP for optimal health outcomes. Message sent to Dr Sondra Come to follow up on his request and see how he would like to move forward. Will await his response.

## 2020-01-09 ENCOUNTER — Encounter: Payer: Self-pay | Admitting: *Deleted

## 2020-01-09 ENCOUNTER — Ambulatory Visit: Payer: Medicare Other | Admitting: Radiation Oncology

## 2020-01-09 NOTE — Telephone Encounter (Signed)
This patient had a biliary stent placed in March.  Subsequent EUS performed, stent left in.  CT scan from 1 month ago shows patent stent in good position, supported by the presence of pneumobilia.  Liver tests have been stable with normal bilirubin.  Not sure what is the question.  Thanks

## 2020-01-09 NOTE — Telephone Encounter (Signed)
Hi Patty, Inocencio Homes from the [employer] at [employer]. [facility] called requesting pt to be seen sooner than Sept. She stated that pt has pancreatic cancer and needs EGD with stent placement in order to continue her radiation therapy. More information about referral is in Epic. Any questions please call Roselyn Reef at [phone].

## 2020-01-09 NOTE — Progress Notes (Signed)
Followed up with Dr Clabe Seal office today regarding message sent late yesterday. He is out of the office today and next week.  I called [facility] and spoke to [facility]. Explained the situation to her, that patient's treatment could not continue until the stent was placed and she couldn't wait until September to be seen. She will send a message to the nurse manager to see about getting the patient in sooner.  I shared with her, per the patient, that she is comfortable seeing any provider for the consultation, but for the procedure she would like either Dr Henrene Pastor or Dr Rush Landmark.   Received phone call from Koren Shiver stating the MDs needed clarification on what procedure what needed, as the patient has a functioning stent. I was able to reach Dr Sondra Come and he clarified that the patient needs placement of fiduciall markers in head of pancreas. This procedure needs to be done asap as the patient's treatment is on hold until done. He also stated that he had called and is waiting for a call back from Dr Ardis Hughs. I sent a message to Patty to clarify Dr Clabe Seal need.   Called patient and gave her an update on the progress. She is thankful for the information. Told her that both the clinic head nurse and Dr Ardis Hughs would be made aware of Dr Clabe Seal urgent request and that hopefully they would work her into their schedule.

## 2020-01-09 NOTE — Telephone Encounter (Signed)
Please advise if pt needs office visit or procedure.

## 2020-01-09 NOTE — Telephone Encounter (Signed)
I do not see any reference to a repeat procedure being needed and Dr. Clabe Seal recent note from 2 days ago.    I will send this to Dr. Henrene Pastor who is her primary gastroenterologist.

## 2020-01-09 NOTE — Telephone Encounter (Signed)
I spoke with Roselyn Reef and advised her of Dr Blanch Media comment. I have also forwarded the information to Dr Sondra Come and Roselyn Reef.

## 2020-01-12 ENCOUNTER — Ambulatory Visit: Payer: Medicare Other | Admitting: Radiation Oncology

## 2020-01-12 NOTE — Progress Notes (Signed)
I am not sure about the advisability (safety wise) of placing a fiducial marker into a mass that has already undergone several radiation treatments. I am happy to discuss this with Dr. Sondra Come however.  I am also going to forward this to Dr. Rush Landmark who has quite a bit more experience with fiducials that I have.  Will keep Dr. Henrene Pastor in the loop as well.

## 2020-01-13 ENCOUNTER — Telehealth: Payer: Self-pay | Admitting: *Deleted

## 2020-01-13 ENCOUNTER — Telehealth: Payer: Self-pay | Admitting: Gastroenterology

## 2020-01-13 ENCOUNTER — Ambulatory Visit: Payer: Medicare Other | Admitting: Radiation Oncology

## 2020-01-13 NOTE — Telephone Encounter (Signed)
Patient called and is upset for she has not heard anything from our office. I advised that the providers were discussing a plan for her and she will get a call either from our office or Dr. Clabe Seal office per Chong Sicilian. Patient understood and hopes it will be soon.

## 2020-01-13 NOTE — Telephone Encounter (Signed)
Message received from patient stating that she has not heard from Dr. Ardis Hughs or Dr. Clabe Seal office and would like to know what the plan is.  Informed pt that Dr. Sondra Come is off for the week and that I would contact Dr. Ardis Hughs office to see if they will call her with a plan.  Pt appreciative of assistance.

## 2020-01-13 NOTE — Telephone Encounter (Signed)
Message left at Dr. Ardis Hughs office requesting that [facility] call pt with update due to pt is calling this office for update.

## 2020-01-14 ENCOUNTER — Ambulatory Visit: Payer: Medicare Other | Admitting: Radiation Oncology

## 2020-01-19 ENCOUNTER — Other Ambulatory Visit: Payer: Self-pay

## 2020-01-19 ENCOUNTER — Encounter: Payer: Self-pay | Admitting: *Deleted

## 2020-01-19 ENCOUNTER — Inpatient Hospital Stay: Payer: Medicare Other | Attending: Hematology & Oncology | Admitting: Hematology & Oncology

## 2020-01-19 ENCOUNTER — Inpatient Hospital Stay: Payer: Medicare Other

## 2020-01-19 ENCOUNTER — Encounter: Payer: Self-pay | Admitting: Radiation Oncology

## 2020-01-19 ENCOUNTER — Other Ambulatory Visit: Payer: Self-pay | Admitting: *Deleted

## 2020-01-19 ENCOUNTER — Encounter: Payer: Self-pay | Admitting: Hematology & Oncology

## 2020-01-19 VITALS — BP 157/68 | HR 68 | Temp 97.7°F | Resp 18 | Wt 98.0 lb

## 2020-01-19 DIAGNOSIS — C259 Malignant neoplasm of pancreas, unspecified: Secondary | ICD-10-CM

## 2020-01-19 DIAGNOSIS — N289 Disorder of kidney and ureter, unspecified: Secondary | ICD-10-CM | POA: Insufficient documentation

## 2020-01-19 DIAGNOSIS — C670 Malignant neoplasm of trigone of bladder: Secondary | ICD-10-CM

## 2020-01-19 LAB — CMP (CANCER CENTER ONLY)
ALT: 41 U/L (ref 0–44)
AST: 32 U/L (ref 15–41)
Albumin: 4.1 g/dL (ref 3.5–5.0)
Alkaline Phosphatase: 142 U/L — ABNORMAL HIGH (ref 38–126)
Anion gap: 7 (ref 5–15)
BUN: 48 mg/dL — ABNORMAL HIGH (ref 8–23)
CO2: 28 mmol/L (ref 22–32)
Calcium: 9.7 mg/dL (ref 8.9–10.3)
Chloride: 105 mmol/L (ref 98–111)
Creatinine: 1.56 mg/dL — ABNORMAL HIGH (ref 0.44–1.00)
GFR, Est AFR Am: 34 mL/min — ABNORMAL LOW (ref >60)
GFR, Estimated: 29 mL/min — ABNORMAL LOW (ref >60)
Glucose, Bld: 131 mg/dL — ABNORMAL HIGH (ref 70–99)
Potassium: 5.1 mmol/L (ref 3.5–5.1)
Sodium: 140 mmol/L (ref 135–145)
Total Bilirubin: 0.4 mg/dL (ref 0.3–1.2)
Total Protein: 7.6 g/dL (ref 6.5–8.1)

## 2020-01-19 LAB — CBC WITH DIFFERENTIAL (CANCER CENTER ONLY)
Abs Immature Granulocytes: 0.02 10*3/uL (ref 0.00–0.07)
Basophils Absolute: 0 10*3/uL (ref 0.0–0.1)
Basophils Relative: 1 %
Eosinophils Absolute: 0.1 10*3/uL (ref 0.0–0.5)
Eosinophils Relative: 2 %
HCT: 33.2 % — ABNORMAL LOW (ref 36.0–46.0)
Hemoglobin: 10.5 g/dL — ABNORMAL LOW (ref 12.0–15.0)
Immature Granulocytes: 0 %
Lymphocytes Relative: 14 %
Lymphs Abs: 0.8 10*3/uL (ref 0.7–4.0)
MCH: 31.3 pg (ref 26.0–34.0)
MCHC: 31.6 g/dL (ref 30.0–36.0)
MCV: 98.8 fL (ref 80.0–100.0)
Monocytes Absolute: 0.5 10*3/uL (ref 0.1–1.0)
Monocytes Relative: 9 %
Neutro Abs: 4.2 10*3/uL (ref 1.7–7.7)
Neutrophils Relative %: 74 %
Platelet Count: 138 10*3/uL — ABNORMAL LOW (ref 150–400)
RBC: 3.36 MIL/uL — ABNORMAL LOW (ref 3.87–5.11)
RDW: 13.5 % (ref 11.5–15.5)
WBC Count: 5.7 10*3/uL (ref 4.0–10.5)
nRBC: 0 % (ref 0.0–0.2)

## 2020-01-19 NOTE — Progress Notes (Signed)
Oncology Nurse Navigator Documentation  Oncology Nurse Navigator Flowsheets 01/19/2020  Abnormal Finding Date -  Diagnosis Status -  Planned Course of Treatment -  Phase of Treatment -  Radiation Actual Start Date: -  Radiation Expected End Date: -  Navigator Follow Up Date: -  Navigator Follow Up Reason: Follow-up Appointment  Navigation Complete Date: 02/27/2020  Navigator Location [facility]  Referral Date to RadOnc/MedOnc -  Navigator Encounter Type Follow-up Appt  Telephone -  Patient Visit Type MedOnc  Treatment Phase Active Tx  Barriers/Navigation Needs Anxiety;Coordination of Care  Education -  Interventions Psycho-Social Support  Acuity Level 2-Minimal Needs (1-2 Barriers Identified)  Referrals -  Coordination of Care -  Education Method -  Support Groups/Services Friends and Family  Time Spent with Patient 15

## 2020-01-19 NOTE — Progress Notes (Signed)
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  We discussed that this procedurwe may  not be successful given her previous for radiation treatments and associated soft tissue swelling.  Given the potential complications with this procedure, her age and the fact it may not be successful,  she does not wish to pursue this procedure.  She does wish to continue with conventional radiation therapy directed at the pancreatic head and will simulated  August 11 with treatments to begin later in the week.  She will receive 6 additional radiation treatments for a cumulative dose of 30 Gray in 10 fractions.  -----------------------------------  Blair Promise, PhD, MD

## 2020-01-20 LAB — CANCER ANTIGEN 19-9: CA 19-9: 197 U/mL — ABNORMAL HIGH (ref 0–35)

## 2020-01-20 NOTE — Progress Notes (Signed)
Hematology and Oncology Follow Up Visit  Glenda King [id number] 09-12-1928 86 y.o. 01/20/2020   Principle Diagnosis:   Pancreatic cancer-localized  Current Therapy:    Patient currently receiving radiation therapy to pancreatic tumor.     Interim History:  Glenda King is back for her second office visit.  We actually saw her for the first time on June 30.  At that time, we did some additional studies on her.  She had an elevated CA 19-9 of 255.  She was seen by Radiation Oncology.  They started radiation on her.  Apparently, there is some issues with respect to trying to accurately delivering the radiation.  There was some thought of getting gastroenterology to put in a fiduciary to help with guiding radiation.  It seems like gastroenterology does not think that a fiduciary would be necessary or helpful.  She will resume radiation therapy next week.  She seems be doing pretty well right now.  She is eating.  She is having no nausea or vomiting.  There is no bleeding.  She is not having any complaints of pain.  There is no problems with bowels or bladder.  She is little bit anemic.  She does have some mild renal insufficiency.  There is no iron deficiency as back in June, her ferritin was 70 with iron saturation of 34%.  Despite her maturity, she really looks quite good.  As always, she is very spunky and perky.  I would have to say that her performance status is probably ECOG 1.  Medications:  Current Outpatient Medications:  .  cholecalciferol (VITAMIN D3) 25 MCG (1000 UNIT) tablet, Take 1,000 Units by mouth 2 (two) times daily., Disp: , Rfl:  .  ferrous sulfate 325 (65 FE) MG tablet, Take 1 tablet (325 mg total) by mouth daily with breakfast., Disp: 30 tablet, Rfl: 3 .  levETIRAcetam (KEPPRA) 500 MG tablet, Take 500 mg by mouth 2 (two) times daily., Disp: , Rfl:  .  levothyroxine (SYNTHROID) 100 MCG tablet, Take 100 mcg by mouth daily., Disp: , Rfl:  .  Melatonin 10  MG CAPS, Take 10 mg by mouth at bedtime., Disp: , Rfl:  .  metoprolol tartrate (LOPRESSOR) 25 MG tablet, Take 12.5 mg by mouth 2 (two) times daily. , Disp: , Rfl:  .  omeprazole (PRILOSEC) 40 MG capsule, Take 1 capsule (40 mg total) by mouth daily., Disp: 30 capsule, Rfl: 3 .  sodium bicarbonate 650 MG tablet, Take 1,300 mg by mouth 2 (two) times daily., Disp: , Rfl:  .  vitamin B-12 1000 MCG tablet, Take 1 tablet (1,000 mcg total) by mouth daily., Disp: 30 tablet, Rfl: 0  Allergies:  Allergies  Allergen Reactions  . Amoxicillin     Pt unsure of reaction     . Gadolinium Derivatives Other (See Comments)    Secondary to her stage IV kidney disease   . Pollen Extract Other (See Comments)    Sneezing    Past Medical History, Surgical history, Social history, and Family History were reviewed and updated.  Review of Systems: Review of Systems  Constitutional: Negative.   HENT:  Negative.   Eyes: Negative.   Respiratory: Negative.   Cardiovascular: Negative.   Gastrointestinal: Negative.   Endocrine: Negative.   Genitourinary: Negative.    Musculoskeletal: Negative.   Skin: Negative.   Neurological: Negative.   Hematological: Negative.   Psychiatric/Behavioral: Negative.     Physical Exam:  weight is 98 lb (44.5 kg). Her oral  temperature is 97.7 F (36.5 C). Her blood pressure is 157/68 (abnormal) and her pulse is 68. Her respiration is 18 and oxygen saturation is 97%.   Wt Readings from Last 3 Encounters:  01/19/20 98 lb (44.5 kg)  12/17/19 97 lb 12.8 oz (44.4 kg)  12/10/19 98 lb (44.5 kg)    Physical Exam Vitals reviewed.  HENT:     Head: Normocephalic and atraumatic.  Eyes:     Pupils: Pupils are equal, round, and reactive to light.  Cardiovascular:     Rate and Rhythm: Normal rate and regular rhythm.     Heart sounds: Normal heart sounds.  Pulmonary:     Effort: Pulmonary effort is normal.     Breath sounds: Normal breath sounds.  Abdominal:     General:  Bowel sounds are normal.     Palpations: Abdomen is soft.  Musculoskeletal:        General: No tenderness or deformity. Normal range of motion.     Cervical back: Normal range of motion.  Lymphadenopathy:     Cervical: No cervical adenopathy.  Skin:    General: Skin is warm and dry.     Findings: No erythema or rash.  Neurological:     Mental Status: She is alert and oriented to person, place, and time.  Psychiatric:        Behavior: Behavior normal.        Thought Content: Thought content normal.        Judgment: Judgment normal.      Lab Results  Component Value Date   WBC 5.7 01/19/2020   HGB 10.5 (L) 01/19/2020   HCT 33.2 (L) 01/19/2020   MCV 98.8 01/19/2020   PLT 138 (L) 01/19/2020     Chemistry      Component Value Date/Time   NA 140 01/19/2020 1356   K 5.1 01/19/2020 1356   CL 105 01/19/2020 1356   CO2 28 01/19/2020 1356   BUN 48 (H) 01/19/2020 1356   CREATININE 1.56 (H) 01/19/2020 1356      Component Value Date/Time   CALCIUM 9.7 01/19/2020 1356   ALKPHOS 142 (H) 01/19/2020 1356   AST 32 01/19/2020 1356   ALT 41 01/19/2020 1356   BILITOT 0.4 01/19/2020 1356      Impression and Plan: Glenda King is a very charming 86 year old white female with pancreatic cancer.  This certainly seems to be localized from all of her studies.  Hopefully, she will be able to finish radiation therapy and will be effective for her.  She does look great.  I know she has a very strong constitution to be able to manage radiation and any side effects.  She will restart radiation next week.  She is supposed to have a total of 10 treatments but radiation may be added onto what she is already had.  I would probably get her back to see her in another month or so.  I would not plan for any scans for least 2 months.  I suspect that she probably is going to need an upper endoscopy and endoscopic ultrasound to better assess for tumor response.   [name], MD 8/10/20217:02  AM

## 2020-01-21 ENCOUNTER — Other Ambulatory Visit: Payer: Self-pay

## 2020-01-21 ENCOUNTER — Ambulatory Visit
Admission: RE | Admit: 2020-01-21 | Discharge: 2020-01-21 | Disposition: A | Discharge status: Home / Self Care | Payer: Medicare Other | Source: Ambulatory Visit | Attending: Radiation Oncology | Admitting: Radiation Oncology

## 2020-01-21 DIAGNOSIS — Z510 Encounter for antineoplastic radiation therapy: Secondary | ICD-10-CM | POA: Diagnosis present

## 2020-01-21 DIAGNOSIS — D490 Neoplasm of unspecified behavior of digestive system: Secondary | ICD-10-CM | POA: Insufficient documentation

## 2020-01-21 DIAGNOSIS — C259 Malignant neoplasm of pancreas, unspecified: Secondary | ICD-10-CM

## 2020-01-22 ENCOUNTER — Other Ambulatory Visit: Payer: Self-pay

## 2020-01-22 ENCOUNTER — Ambulatory Visit
Admission: RE | Admit: 2020-01-22 | Discharge: 2020-01-22 | Disposition: A | Discharge status: Home / Self Care | Payer: Medicare Other | Source: Ambulatory Visit | Attending: Radiation Oncology | Admitting: Radiation Oncology

## 2020-01-22 DIAGNOSIS — Z510 Encounter for antineoplastic radiation therapy: Secondary | ICD-10-CM | POA: Diagnosis not present

## 2020-01-23 ENCOUNTER — Other Ambulatory Visit: Payer: Self-pay

## 2020-01-23 ENCOUNTER — Ambulatory Visit
Admission: RE | Admit: 2020-01-23 | Discharge: 2020-01-23 | Disposition: A | Discharge status: Home / Self Care | Payer: Medicare Other | Source: Ambulatory Visit | Attending: Radiation Oncology | Admitting: Radiation Oncology

## 2020-01-23 DIAGNOSIS — Z510 Encounter for antineoplastic radiation therapy: Secondary | ICD-10-CM | POA: Diagnosis not present

## 2020-01-26 ENCOUNTER — Ambulatory Visit
Admission: RE | Admit: 2020-01-26 | Discharge: 2020-01-26 | Disposition: A | Discharge status: Home / Self Care | Payer: Medicare Other | Source: Ambulatory Visit | Attending: Radiation Oncology | Admitting: Radiation Oncology

## 2020-01-26 ENCOUNTER — Other Ambulatory Visit: Payer: Self-pay

## 2020-01-26 DIAGNOSIS — Z510 Encounter for antineoplastic radiation therapy: Secondary | ICD-10-CM | POA: Diagnosis not present

## 2020-01-27 ENCOUNTER — Ambulatory Visit
Admission: RE | Admit: 2020-01-27 | Discharge: 2020-01-27 | Disposition: A | Discharge status: Home / Self Care | Payer: Medicare Other | Source: Ambulatory Visit | Attending: Radiation Oncology | Admitting: Radiation Oncology

## 2020-01-27 ENCOUNTER — Other Ambulatory Visit: Payer: Self-pay

## 2020-01-27 DIAGNOSIS — Z510 Encounter for antineoplastic radiation therapy: Secondary | ICD-10-CM | POA: Diagnosis not present

## 2020-01-28 ENCOUNTER — Other Ambulatory Visit: Payer: Self-pay

## 2020-01-28 ENCOUNTER — Ambulatory Visit
Admission: RE | Admit: 2020-01-28 | Discharge: 2020-01-28 | Disposition: A | Discharge status: Home / Self Care | Payer: Medicare Other | Source: Ambulatory Visit | Attending: Radiation Oncology | Admitting: Radiation Oncology

## 2020-01-28 DIAGNOSIS — Z510 Encounter for antineoplastic radiation therapy: Secondary | ICD-10-CM | POA: Diagnosis not present

## 2020-01-29 ENCOUNTER — Encounter: Payer: Self-pay | Admitting: Radiation Oncology

## 2020-01-29 ENCOUNTER — Other Ambulatory Visit: Payer: Self-pay

## 2020-01-29 ENCOUNTER — Ambulatory Visit
Admission: RE | Admit: 2020-01-29 | Discharge: 2020-01-29 | Disposition: A | Discharge status: Home / Self Care | Payer: Medicare Other | Source: Ambulatory Visit | Attending: Radiation Oncology | Admitting: Radiation Oncology

## 2020-01-29 DIAGNOSIS — Z510 Encounter for antineoplastic radiation therapy: Secondary | ICD-10-CM | POA: Diagnosis not present

## 2020-02-02 NOTE — Progress Notes (Incomplete)
  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 She did report some mild fatigue. She denied abdominal pain, nausea, vomiting, diarrhea, and skin changes. On 01/06/2020, the patient's anatomy was noted to have changed from day-to-day, which made it difficult to see the target area. In light of that, we had discussed endoscopic ultrasound and potentially placing fiducial markers in the pancreas head to facilitate imaged-guied therapy. We discussed that the procedure may not be successful given her previous radiation treatments and associated soft tissue swelling. Given the potential complications with the procedure, her age, and the fact it may not be successful, she did not wish to pursue the procedure. She did, however, wish to continue with conventional radiation therapy directed at the pancreatic head. She was re-simulated on August 11th and received six additional radiation treatments for a cumulative dose of 30 Gray in 10 fractions.  Plan: The patient will follow-up with radiation oncology in one month.  ________________________________________________   Blair Promise,  PhD, MD  This document serves as a record of services personally performed by Gery Pray, MD. It was created on his behalf by Clerance Lav, a trained medical scribe. The creation of this record is based on the scribe's personal observations and the provider's statements to them. This document has been checked and approved by the attending provider.

## 2020-02-26 NOTE — Progress Notes (Signed)
Radiation Oncology         [phone] ________________________________  Name: Glenda King MRN: [id number]  Date: 03/01/2020  DOB: June 11, 1929  Follow-Up Visit Note  CC: Javier Glazier, MD  [name], MD    ICD-10-CM   1. 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  Radiation Treatment Dates: 12/31/2019 through 01/29/2020 Site Technique Total Dose (Gy) Dose per Fx (Gy) Completed Fx Beam Energies  Pancreas: Pancreas 3D 18/18 3 6/6 10X, 15X  Pancreas: Pancreas IMRT 12/12 3 4/4 6XFFF   Narrative:  The patient returns today for routine follow-up. Since the end of treatment, the patient was seen by Dr. Marin Olp on 02/27/2020. At that time, her liver function studies were noted to be elevated, possibly from recent radiosurgery. Labs will be repeated during her next follow-up with him in a few weeks.  On review of systems, she reports feeling well and that fatigue is gradually improving. She denies abdominal pain and changes in bowel/bladder patterns.  ALLERGIES:  is allergic to gadolinium derivatives, amoxicillin, and pollen extract.  Meds: Current Outpatient Medications  Medication Sig Dispense Refill  . cholecalciferol (VITAMIN D3) 25 MCG (1000 UNIT) tablet Take 1,000 Units by mouth 2 (two) times daily.    . ferrous sulfate 325 (65 FE) MG tablet Take 1 tablet (325 mg total) by mouth daily with breakfast. 30 tablet 3  . levETIRAcetam (KEPPRA) 500 MG tablet Take 500 mg by mouth 2 (two) times daily.    Marland Kitchen levothyroxine (SYNTHROID) 100 MCG tablet Take 100 mcg by mouth daily.    . Melatonin 10 MG CAPS Take 10 mg by mouth at bedtime.    . metoprolol tartrate (LOPRESSOR) 25 MG tablet Take 12.5 mg by mouth 2 (two) times daily.     . sodium bicarbonate 650 MG tablet Take 1,300 mg by mouth 2 (two) times daily.    . vitamin B-12 1000 MCG tablet  Take 1 tablet (1,000 mcg total) by mouth daily. 30 tablet 0   No current facility-administered medications for this encounter.    Physical Findings: The patient is in no acute distress. Patient is alert and oriented.  height is 5\' 6"  (1.676 m) and weight is 98 lb 3.2 oz (44.5 kg). Her temperature is 97.4 F (36.3 C) (abnormal). Her blood pressure is 118/59 (abnormal) and her pulse is 70. Her respiration is 20 and oxygen saturation is 98%.  No significant changes. Lungs are clear to auscultation bilaterally. Heart has regular rate and rhythm. No palpable cervical, supraclavicular, or axillary adenopathy. Abdomen soft, non-tender, normal bowel sounds.  No hepatosplenomegaly.  No palpable mass in the epigastric region  Lab Findings: Lab Results  Component Value Date   WBC 5.2 02/27/2020   HGB 10.7 (L) 02/27/2020   HCT 33.7 (L) 02/27/2020   MCV 98.8 02/27/2020   PLT 139 (L) 02/27/2020    Radiographic Findings: No results found.  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  CA 19-9 continues to decrease consistent with favorable response from her radiation therapy.  Plan: The patient will follow-up with Dr. Marin Olp on 03/12/2020, during which time LFTs will be repeated. She will follow-up with radiation oncology as needed basis in light of her close follow-up with medical oncology.    ____________________________________   Blair Promise,  PhD, MD  This document serves as a record of services personally performed by Gery Pray, MD. It was created on his behalf by Clerance Lav, a trained medical scribe. The creation of this record is based on the scribe's personal observations and the provider's statements to them. This document has been checked and approved by the attending provider.

## 2020-02-27 ENCOUNTER — Inpatient Hospital Stay: Payer: Medicare Other

## 2020-02-27 ENCOUNTER — Inpatient Hospital Stay: Payer: Medicare Other | Attending: Hematology & Oncology | Admitting: Hematology & Oncology

## 2020-02-27 ENCOUNTER — Other Ambulatory Visit: Payer: Self-pay

## 2020-02-27 ENCOUNTER — Encounter: Payer: Self-pay | Admitting: *Deleted

## 2020-02-27 ENCOUNTER — Encounter: Payer: Self-pay | Admitting: Hematology & Oncology

## 2020-02-27 VITALS — BP 138/59 | HR 75 | Temp 97.4°F | Resp 18 | Ht 66.0 in | Wt 96.0 lb

## 2020-02-27 DIAGNOSIS — C259 Malignant neoplasm of pancreas, unspecified: Secondary | ICD-10-CM

## 2020-02-27 DIAGNOSIS — R7989 Other specified abnormal findings of blood chemistry: Secondary | ICD-10-CM | POA: Diagnosis not present

## 2020-02-27 LAB — CMP (CANCER CENTER ONLY)
ALT: 155 U/L — ABNORMAL HIGH (ref 0–44)
AST: 115 U/L — ABNORMAL HIGH (ref 15–41)
Albumin: 3.9 g/dL (ref 3.5–5.0)
Alkaline Phosphatase: 267 U/L — ABNORMAL HIGH (ref 38–126)
Anion gap: 7 (ref 5–15)
BUN: 45 mg/dL — ABNORMAL HIGH (ref 8–23)
CO2: 28 mmol/L (ref 22–32)
Calcium: 9.6 mg/dL (ref 8.9–10.3)
Chloride: 102 mmol/L (ref 98–111)
Creatinine: 1.73 mg/dL — ABNORMAL HIGH (ref 0.44–1.00)
GFR, Est AFR Am: 30 mL/min — ABNORMAL LOW (ref >60)
GFR, Estimated: 26 mL/min — ABNORMAL LOW (ref >60)
Glucose, Bld: 199 mg/dL — ABNORMAL HIGH (ref 70–99)
Potassium: 5.1 mmol/L (ref 3.5–5.1)
Sodium: 137 mmol/L (ref 135–145)
Total Bilirubin: 0.5 mg/dL (ref 0.3–1.2)
Total Protein: 7.8 g/dL (ref 6.5–8.1)

## 2020-02-27 LAB — CBC WITH DIFFERENTIAL (CANCER CENTER ONLY)
Abs Immature Granulocytes: 0.03 10*3/uL (ref 0.00–0.07)
Basophils Absolute: 0 10*3/uL (ref 0.0–0.1)
Basophils Relative: 1 %
Eosinophils Absolute: 0.1 10*3/uL (ref 0.0–0.5)
Eosinophils Relative: 1 %
HCT: 33.7 % — ABNORMAL LOW (ref 36.0–46.0)
Hemoglobin: 10.7 g/dL — ABNORMAL LOW (ref 12.0–15.0)
Immature Granulocytes: 1 %
Lymphocytes Relative: 11 %
Lymphs Abs: 0.6 10*3/uL — ABNORMAL LOW (ref 0.7–4.0)
MCH: 31.4 pg (ref 26.0–34.0)
MCHC: 31.8 g/dL (ref 30.0–36.0)
MCV: 98.8 fL (ref 80.0–100.0)
Monocytes Absolute: 0.4 10*3/uL (ref 0.1–1.0)
Monocytes Relative: 8 %
Neutro Abs: 4.1 10*3/uL (ref 1.7–7.7)
Neutrophils Relative %: 78 %
Platelet Count: 139 10*3/uL — ABNORMAL LOW (ref 150–400)
RBC: 3.41 MIL/uL — ABNORMAL LOW (ref 3.87–5.11)
RDW: 12.8 % (ref 11.5–15.5)
WBC Count: 5.2 10*3/uL (ref 4.0–10.5)
nRBC: 0 % (ref 0.0–0.2)

## 2020-02-27 MED ORDER — SODIUM CHLORIDE 0.9 % IV SOLN
INTRAVENOUS | Status: AC
  Filled 2020-02-27: qty 250

## 2020-02-27 MED ORDER — SODIUM CHLORIDE 0.9 % IV SOLN
Freq: Once | INTRAVENOUS | Status: AC
  Filled 2020-02-27: qty 250

## 2020-02-27 NOTE — Progress Notes (Signed)
Hematology and Oncology Follow Up Visit  Glenda King [id number] Mar 15, 1929 86 y.o. 02/27/2020   Principle Diagnosis:   Pancreatic cancer-localized  Current Therapy:    Status post radiosurgery-completed on 01/29/2020.       Interim History:  Glenda King is back for her follow-up.  She is still quite thin.  She is just worried about having long-term effects from the radiosurgery.  I told her that she could certainly have effects for about 2-3 months.  She finished up her radiosurgery in mid August.  She actually thought to pretty well with this.  She is eating okay.  She has had no nausea or vomiting.  There is no diarrhea.  She clearly looks dehydrated.  We we will give her some IV fluid in the office.  She has had no rashes.  She has had no fever.  There is no cough.  When we last saw her, her ferritin was 70 with iron saturation of 34%.  I would have to say that her performance status is probably ECOG 1.  Medications:  Current Outpatient Medications:  .  cholecalciferol (VITAMIN D3) 25 MCG (1000 UNIT) tablet, Take 1,000 Units by mouth 2 (two) times daily., Disp: , Rfl:  .  ferrous sulfate 325 (65 FE) MG tablet, Take 1 tablet (325 mg total) by mouth daily with breakfast., Disp: 30 tablet, Rfl: 3 .  levETIRAcetam (KEPPRA) 500 MG tablet, Take 500 mg by mouth 2 (two) times daily., Disp: , Rfl:  .  levothyroxine (SYNTHROID) 100 MCG tablet, Take 100 mcg by mouth daily., Disp: , Rfl:  .  Melatonin 10 MG CAPS, Take 10 mg by mouth at bedtime., Disp: , Rfl:  .  metoprolol tartrate (LOPRESSOR) 25 MG tablet, Take 12.5 mg by mouth 2 (two) times daily. , Disp: , Rfl:  .  sodium bicarbonate 650 MG tablet, Take 1,300 mg by mouth 2 (two) times daily., Disp: , Rfl:  .  vitamin B-12 1000 MCG tablet, Take 1 tablet (1,000 mcg total) by mouth daily., Disp: 30 tablet, Rfl: 0 No current facility-administered medications for this visit.  Facility-Administered Medications Ordered in Other  Visits:  .  0.9 %  sodium chloride infusion, , Intravenous, Once, Tiare Rohlman, Rudell Cobb, MD  Allergies:  Allergies  Allergen Reactions  . Gadolinium Derivatives Other (See Comments)    Secondary to her stage IV kidney disease   . Amoxicillin Other (See Comments)    UNKNOWN REACTION     . Pollen Extract Other (See Comments)    Sneezing    Past Medical History, Surgical history, Social history, and Family History were reviewed and updated.  Review of Systems: Review of Systems  Constitutional: Negative.   HENT:  Negative.   Eyes: Negative.   Respiratory: Negative.   Cardiovascular: Negative.   Gastrointestinal: Negative.   Endocrine: Negative.   Genitourinary: Negative.    Musculoskeletal: Negative.   Skin: Negative.   Neurological: Negative.   Hematological: Negative.   Psychiatric/Behavioral: Negative.     Physical Exam:  height is 5\' 6"  (1.676 m) and weight is 96 lb (43.5 kg). Her oral temperature is 97.4 F (36.3 C) (abnormal). Her blood pressure is 138/59 (abnormal) and her pulse is 75. Her respiration is 18 and oxygen saturation is 98%.   Wt Readings from Last 3 Encounters:  02/27/20 96 lb (43.5 kg)  01/19/20 98 lb (44.5 kg)  12/17/19 97 lb 12.8 oz (44.4 kg)    Physical Exam Vitals reviewed.  HENT:  Head: Normocephalic and atraumatic.  Eyes:     Pupils: Pupils are equal, round, and reactive to light.  Cardiovascular:     Rate and Rhythm: Normal rate and regular rhythm.     Heart sounds: Normal heart sounds.  Pulmonary:     Effort: Pulmonary effort is normal.     Breath sounds: Normal breath sounds.  Abdominal:     General: Bowel sounds are normal.     Palpations: Abdomen is soft.  Musculoskeletal:        General: No tenderness or deformity. Normal range of motion.     Cervical back: Normal range of motion.  Lymphadenopathy:     Cervical: No cervical adenopathy.  Skin:    General: Skin is warm and dry.     Findings: No erythema or rash.    Neurological:     Mental Status: She is alert and oriented to person, place, and time.  Psychiatric:        Behavior: Behavior normal.        Thought Content: Thought content normal.        Judgment: Judgment normal.      Lab Results  Component Value Date   WBC 5.2 02/27/2020   HGB 10.7 (L) 02/27/2020   HCT 33.7 (L) 02/27/2020   MCV 98.8 02/27/2020   PLT 139 (L) 02/27/2020     Chemistry      Component Value Date/Time   NA 137 02/27/2020 1301   K 5.1 02/27/2020 1301   CL 102 02/27/2020 1301   CO2 28 02/27/2020 1301   BUN 45 (H) 02/27/2020 1301   CREATININE 1.73 (H) 02/27/2020 1301      Component Value Date/Time   CALCIUM 9.6 02/27/2020 1301   ALKPHOS 267 (H) 02/27/2020 1301   AST 115 (H) 02/27/2020 1301   ALT 155 (H) 02/27/2020 1301   BILITOT 0.5 02/27/2020 1301      Impression and Plan: Glenda King is a very charming 86 year old white female with pancreatic cancer.  This certainly seems to be localized from all of her studies.  I do feel bad that she is having a tough time.  She is a very strong lady.  I noted that her liver function studies were elevated.  I suppose this might be from the radiosurgery.  Her bilirubin is not elevated so I would not think that this is blockage of her bowel system.  Hopefully, the IV fluids will help a little bit.  We will go to have to follow-up probably in a couple weeks.  We will have to see how the liver function studies look.  If they are no better or worse, then we will have to get scans to see what might be going on.  This is a little more complicated than I would have thought.  I had to spend a good 30 minutes with her or so.     [name], MD 9/17/20212:46 PM

## 2020-02-27 NOTE — Patient Instructions (Signed)

## 2020-02-27 NOTE — Progress Notes (Signed)
Met with patient prior to visit with MD. She is doing well post radiation but still has complaints of fatigue and decreased appetite. She is currently using 2 Boost daily to help supplement her intake. We discussed a nutrition consult. She agreed and it was scheduled. Boost samples given to patient.   Oncology Nurse Navigator Documentation  Oncology Nurse Navigator Flowsheets 02/27/2020  Abnormal Finding Date -  Diagnosis Status -  Planned Course of Treatment -  Phase of Treatment Radiation  Radiation Actual Start Date: -  Radiation Expected End Date: -  Radiation Actual End Date: 01/29/2020  Navigator Follow Up Date: 03/09/2020  Navigator Follow Up Reason: Other:  Navigation Complete Date: -  Navigator Location [facility]  Referral Date to RadOnc/MedOnc -  Navigator Encounter Type Follow-up Appt  Telephone -  Patient Visit Type MedOnc  Treatment Phase Post-Tx Follow-up  Barriers/Navigation Needs Anxiety;Coordination of Care  Education -  Interventions Coordination of Care;Education;Psycho-Social Support;Referrals  Acuity Level 2-Minimal Needs (1-2 Barriers Identified)  Referrals Nutrition/dietician  Coordination of Care Appts  Education Method Verbal  Support Groups/Services Friends and Family  Time Spent with Patient 13

## 2020-02-28 LAB — CANCER ANTIGEN 19-9: CA 19-9: 150 U/mL — ABNORMAL HIGH (ref 0–35)

## 2020-03-01 ENCOUNTER — Encounter: Payer: Self-pay | Admitting: Radiation Oncology

## 2020-03-01 ENCOUNTER — Ambulatory Visit
Admission: RE | Admit: 2020-03-01 | Discharge: 2020-03-01 | Disposition: A | Discharge status: Home / Self Care | Payer: Medicare Other | Source: Ambulatory Visit | Attending: Radiation Oncology | Admitting: Radiation Oncology

## 2020-03-01 ENCOUNTER — Other Ambulatory Visit: Payer: Self-pay

## 2020-03-01 ENCOUNTER — Telehealth: Payer: Self-pay

## 2020-03-01 DIAGNOSIS — Z79899 Other long term (current) drug therapy: Secondary | ICD-10-CM | POA: Diagnosis not present

## 2020-03-01 DIAGNOSIS — Z923 Personal history of irradiation: Secondary | ICD-10-CM | POA: Insufficient documentation

## 2020-03-01 DIAGNOSIS — R5383 Other fatigue: Secondary | ICD-10-CM | POA: Diagnosis not present

## 2020-03-01 DIAGNOSIS — C259 Malignant neoplasm of pancreas, unspecified: Secondary | ICD-10-CM | POA: Diagnosis present

## 2020-03-01 DIAGNOSIS — K831 Obstruction of bile duct: Secondary | ICD-10-CM | POA: Diagnosis not present

## 2020-03-01 NOTE — Progress Notes (Signed)
Patient here for a 1 month follow up with Dr. Sondra Come. Patient denies pain and reports feeling better. Weight is leveling out. Patient reports eating every day and fatigue is gradually getting better. Denies problems with bladder and bowels.  BP (!) 118/59 (BP Location: Left Arm, Patient Position: Sitting, Cuff Size: Normal)   Pulse 70   Temp (!) 97.4 F (36.3 C)   Resp 20   Ht 5\' 6"  (1.676 m)   Wt 98 lb 3.2 oz (44.5 kg)   SpO2 98%   BMI 15.85 kg/m   Wt Readings from Last 3 Encounters:  03/01/20 98 lb 3.2 oz (44.5 kg)  02/27/20 96 lb (43.5 kg)  01/19/20 98 lb (44.5 kg)

## 2020-03-01 NOTE — Telephone Encounter (Signed)
Called and informed patient CA-19 has come down, patient verbalized understanding and denies any questions or concerns at this time.

## 2020-03-09 ENCOUNTER — Other Ambulatory Visit: Payer: Self-pay

## 2020-03-09 ENCOUNTER — Encounter: Payer: Self-pay | Admitting: *Deleted

## 2020-03-09 ENCOUNTER — Inpatient Hospital Stay: Payer: Medicare Other | Admitting: Nutrition

## 2020-03-09 NOTE — Progress Notes (Signed)
86 year old female diagnosed with pancreas cancer s/p radiation therapy with Dr. Sondra Come. Patient is followed by Dr. Marin Olp.  PMH includes CKD and Anemia.  Medications include Vitamin D, Ferrous sulfate, Synthroid, and Vitamin B12.  Labs include Glucose 199, BUN 45 and Creatinine 1.73.  Height: 5'6" Weight: 95.08 pounds. UBW: 95-98 pounds per patient. BMI: 15.35.  Patient reports decreased appetite and fatigue after radiation treatments. She is underweight. Reports drinking juice and eating a muffin with butter at breakfast. She does not eat lunch. She eats a meat, vegetable and starch at dinner. She will occasionally snack on a piece of salami and triscuit. She is drinking 2 cartons of Boost BID.  Reports drinking about 6 glasses of water. She denies other nutrition impact symptoms.  Estimated Nutrition Needs: 1500-1800 kcal, 50-65 grams protein, 1.8 L fluid.  Nutrition Diagnosis: Underweight related to inadequate oral intake as evidenced by actual intake less than estimated nutrition needs.  Intervention: Calculated average calorie intake to be ~1200 kcal daily. Recommended increase Boost to Boost Plus and consume TID between meals. (360 kcal, 14 gms protein each) Educated on adding additional calories to breakfast and snacks. Discouraged limiting foods patient loves. Provided fact sheets and contact information. Questions answered and teach back method used. Contact information provided.  Monitoring, Evaluation, Goals: Patient will increase calories and protein to promote weight gain and improve energy.  Next Visit: To be scheduled as needed.

## 2020-03-09 NOTE — Progress Notes (Signed)
Oncology Nurse Navigator Documentation  Oncology Nurse Navigator Flowsheets 03/09/2020  Abnormal Finding Date -  Diagnosis Status -  Planned Course of Treatment -  Phase of Treatment -  Radiation Actual Start Date: -  Radiation Expected End Date: -  Radiation Actual End Date: -  Navigator Follow Up Date: 03/12/2020  Navigator Follow Up Reason: Follow-up Appointment  Navigation Complete Date: -  Navigator Location [facility]  Referral Date to RadOnc/MedOnc -  Navigator Encounter Type Telephone;Appt/Treatment Plan Review  Telephone -  Patient Visit Type MedOnc  Treatment Phase Post-Tx Follow-up  Barriers/Navigation Needs Anxiety;Coordination of Care  Education -  Interventions Coordination of Care;Psycho-Social Support  Acuity Level 2-Minimal Needs (1-2 Barriers Identified)  Referrals Nutrition/dietician  Coordination of Care Appts  Education Method -  Support Groups/Services Friends and Family  Time Spent with Patient 30

## 2020-03-12 ENCOUNTER — Inpatient Hospital Stay: Payer: Medicare Other | Attending: Hematology & Oncology

## 2020-03-12 ENCOUNTER — Other Ambulatory Visit: Payer: Self-pay

## 2020-03-12 ENCOUNTER — Encounter: Payer: Self-pay | Admitting: Hematology & Oncology

## 2020-03-12 ENCOUNTER — Inpatient Hospital Stay (HOSPITAL_BASED_OUTPATIENT_CLINIC_OR_DEPARTMENT_OTHER): Payer: Medicare Other | Admitting: Hematology & Oncology

## 2020-03-12 ENCOUNTER — Telehealth: Payer: Self-pay | Admitting: Hematology & Oncology

## 2020-03-12 ENCOUNTER — Encounter: Payer: Self-pay | Admitting: *Deleted

## 2020-03-12 ENCOUNTER — Inpatient Hospital Stay: Payer: Medicare Other

## 2020-03-12 VITALS — BP 125/53 | HR 77 | Temp 97.6°F | Resp 20 | Wt 97.0 lb

## 2020-03-12 DIAGNOSIS — I4891 Unspecified atrial fibrillation: Secondary | ICD-10-CM | POA: Diagnosis not present

## 2020-03-12 DIAGNOSIS — D72829 Elevated white blood cell count, unspecified: Secondary | ICD-10-CM | POA: Diagnosis not present

## 2020-03-12 DIAGNOSIS — R7989 Other specified abnormal findings of blood chemistry: Secondary | ICD-10-CM | POA: Insufficient documentation

## 2020-03-12 DIAGNOSIS — Z955 Presence of coronary angioplasty implant and graft: Secondary | ICD-10-CM | POA: Insufficient documentation

## 2020-03-12 DIAGNOSIS — C259 Malignant neoplasm of pancreas, unspecified: Secondary | ICD-10-CM | POA: Insufficient documentation

## 2020-03-12 LAB — CBC WITH DIFFERENTIAL (CANCER CENTER ONLY)
Abs Immature Granulocytes: 0.02 10*3/uL (ref 0.00–0.07)
Basophils Absolute: 0.1 10*3/uL (ref 0.0–0.1)
Basophils Relative: 1 %
Eosinophils Absolute: 0.2 10*3/uL (ref 0.0–0.5)
Eosinophils Relative: 3 %
HCT: 32.8 % — ABNORMAL LOW (ref 36.0–46.0)
Hemoglobin: 10.4 g/dL — ABNORMAL LOW (ref 12.0–15.0)
Immature Granulocytes: 0 %
Lymphocytes Relative: 10 %
Lymphs Abs: 0.5 10*3/uL — ABNORMAL LOW (ref 0.7–4.0)
MCH: 31.4 pg (ref 26.0–34.0)
MCHC: 31.7 g/dL (ref 30.0–36.0)
MCV: 99.1 fL (ref 80.0–100.0)
Monocytes Absolute: 0.3 10*3/uL (ref 0.1–1.0)
Monocytes Relative: 6 %
Neutro Abs: 4.3 10*3/uL (ref 1.7–7.7)
Neutrophils Relative %: 80 %
Platelet Count: 169 10*3/uL (ref 150–400)
RBC: 3.31 MIL/uL — ABNORMAL LOW (ref 3.87–5.11)
RDW: 12.5 % (ref 11.5–15.5)
WBC Count: 5.4 10*3/uL (ref 4.0–10.5)
nRBC: 0 % (ref 0.0–0.2)

## 2020-03-12 LAB — CMP (CANCER CENTER ONLY)
ALT: 27 U/L (ref 0–44)
AST: 23 U/L (ref 15–41)
Albumin: 3.8 g/dL (ref 3.5–5.0)
Alkaline Phosphatase: 130 U/L — ABNORMAL HIGH (ref 38–126)
Anion gap: 6 (ref 5–15)
BUN: 49 mg/dL — ABNORMAL HIGH (ref 8–23)
CO2: 29 mmol/L (ref 22–32)
Calcium: 9.9 mg/dL (ref 8.9–10.3)
Chloride: 103 mmol/L (ref 98–111)
Creatinine: 1.61 mg/dL — ABNORMAL HIGH (ref 0.44–1.00)
GFR, Est AFR Am: 33 mL/min — ABNORMAL LOW (ref >60)
GFR, Estimated: 28 mL/min — ABNORMAL LOW (ref >60)
Glucose, Bld: 251 mg/dL — ABNORMAL HIGH (ref 70–99)
Potassium: 4.8 mmol/L (ref 3.5–5.1)
Sodium: 138 mmol/L (ref 135–145)
Total Bilirubin: 0.4 mg/dL (ref 0.3–1.2)
Total Protein: 7.5 g/dL (ref 6.5–8.1)

## 2020-03-12 LAB — LACTATE DEHYDROGENASE: LDH: 85 U/L — ABNORMAL LOW (ref 98–192)

## 2020-03-12 NOTE — Progress Notes (Signed)
Oncology Nurse Navigator Documentation  Oncology Nurse Navigator Flowsheets 03/12/2020  Abnormal Finding Date -  Diagnosis Status -  Planned Course of Treatment -  Phase of Treatment -  Radiation Actual Start Date: -  Radiation Expected End Date: -  Radiation Actual End Date: -  Navigator Follow Up Date: 04/26/2020  Navigator Follow Up Reason: Follow-up Appointment  Navigation Complete Date: -  Navigator Location [facility]  Referral Date to RadOnc/MedOnc -  Navigator Encounter Type Appt/Treatment Plan Review  Telephone -  Patient Visit Type MedOnc  Treatment Phase Post-Tx Follow-up  Barriers/Navigation Needs Anxiety;Coordination of Care  Education -  Interventions -  Acuity Level 2-Minimal Needs (1-2 Barriers Identified)  Referrals -  Coordination of Care -  Education Method -  Support Groups/Services Friends and Family  Time Spent with Patient 15

## 2020-03-12 NOTE — Progress Notes (Signed)
Hematology and Oncology Follow Up Visit  Glenda King [id number] 1928-09-15 86 y.o. 03/12/2020   Principle Diagnosis:   Pancreatic cancer-localized  Current Therapy:    Status post radiosurgery-completed on 01/29/2020.       Interim History:  [name] is back for her follow-up.  She is holding her own right now.  Her weight is holding steady.  She is eating more.  She is liberalizing her diet.  Her blood sugar is up a little bit but that does not bother me all that much.  She has had no abdominal pain.  There has been no problems with bowels or bladder.  Her last CA 19-9 was 150.  This is improved.  She has had no problems with fever.  There is no cough or shortness of breath.  She has had no bleeding.  There has been no leg swelling.  I would have to say that her performance status is probably ECOG 1.  Medications:  Current Outpatient Medications:  .  cholecalciferol (VITAMIN D3) 25 MCG (1000 UNIT) tablet, Take 1,000 Units by mouth 2 (two) times daily., Disp: , Rfl:  .  ferrous sulfate 325 (65 FE) MG tablet, Take 1 tablet (325 mg total) by mouth daily with breakfast., Disp: 30 tablet, Rfl: 3 .  levETIRAcetam (KEPPRA) 500 MG tablet, Take 500 mg by mouth 2 (two) times daily., Disp: , Rfl:  .  levothyroxine (SYNTHROID) 100 MCG tablet, Take 100 mcg by mouth daily., Disp: , Rfl:  .  Melatonin 10 MG CAPS, Take 10 mg by mouth at bedtime., Disp: , Rfl:  .  metoprolol tartrate (LOPRESSOR) 25 MG tablet, Take 12.5 mg by mouth 2 (two) times daily. , Disp: , Rfl:  .  sodium bicarbonate 650 MG tablet, Take 1,300 mg by mouth 2 (two) times daily., Disp: , Rfl:  .  vitamin B-12 1000 MCG tablet, Take 1 tablet (1,000 mcg total) by mouth daily., Disp: 30 tablet, Rfl: 0  Allergies:  Allergies  Allergen Reactions  . Gadolinium Derivatives Other (See Comments)    Secondary to her stage IV kidney disease   . Amoxicillin Other (See Comments)    UNKNOWN REACTION     . Pollen  Extract Other (See Comments)    Sneezing    Past Medical History, Surgical history, Social history, and Family History were reviewed and updated.  Review of Systems: Review of Systems  Constitutional: Negative.   HENT:  Negative.   Eyes: Negative.   Respiratory: Negative.   Cardiovascular: Negative.   Gastrointestinal: Negative.   Endocrine: Negative.   Genitourinary: Negative.    Musculoskeletal: Negative.   Skin: Negative.   Neurological: Negative.   Hematological: Negative.   Psychiatric/Behavioral: Negative.     Physical Exam:  weight is 97 lb (44 kg). Her oral temperature is 97.6 F (36.4 C). Her blood pressure is 125/53 (abnormal) and her pulse is 77. Her respiration is 20 and oxygen saturation is 98%.   Wt Readings from Last 3 Encounters:  03/12/20 97 lb (44 kg)  03/09/20 95 lb 1.3 oz (43.1 kg)  03/01/20 98 lb 3.2 oz (44.5 kg)    Physical Exam Vitals reviewed.  HENT:     Head: Normocephalic and atraumatic.  Eyes:     Pupils: Pupils are equal, round, and reactive to light.  Cardiovascular:     Rate and Rhythm: Normal rate and regular rhythm.     Heart sounds: Normal heart sounds.  Pulmonary:     Effort: Pulmonary effort  is normal.     Breath sounds: Normal breath sounds.  Abdominal:     General: Bowel sounds are normal.     Palpations: Abdomen is soft.  Musculoskeletal:        General: No tenderness or deformity. Normal range of motion.     Cervical back: Normal range of motion.  Lymphadenopathy:     Cervical: No cervical adenopathy.  Skin:    General: Skin is warm and dry.     Findings: No erythema or rash.  Neurological:     Mental Status: She is alert and oriented to person, place, and time.  Psychiatric:        Behavior: Behavior normal.        Thought Content: Thought content normal.        Judgment: Judgment normal.      Lab Results  Component Value Date   WBC 5.4 03/12/2020   HGB 10.4 (L) 03/12/2020   HCT 32.8 (L) 03/12/2020   MCV  99.1 03/12/2020   PLT 169 03/12/2020     Chemistry      Component Value Date/Time   NA 138 03/12/2020 1109   K 4.8 03/12/2020 1109   CL 103 03/12/2020 1109   CO2 29 03/12/2020 1109   BUN 49 (H) 03/12/2020 1109   CREATININE 1.61 (H) 03/12/2020 1109      Component Value Date/Time   CALCIUM 9.9 03/12/2020 1109   ALKPHOS 130 (H) 03/12/2020 1109   AST 23 03/12/2020 1109   ALT 27 03/12/2020 1109   BILITOT 0.4 03/12/2020 1109      Impression and Plan: [name] is a very charming 86 year old white female with pancreatic cancer.  This certainly seems to be localized from all of her studies.  I I am happy that she is making a recovery.  She does look better.  She is more energetic.  It was nice talking to her about the [facility].  We will now get her set up with a MRI of the abdomen.  We will do this the first week in November.  This should give [place] a good idea as to how well the radiation worked.  I will plan to see her back the week afterwards.  We are dealing with quality of life issues here.  I does want her to have a good quality of life.       [name], MD 10/1/202112:12 PM

## 2020-03-12 NOTE — Telephone Encounter (Signed)
Appointments scheduled calendar printed & mailed per 10/1 los 

## 2020-03-13 LAB — CANCER ANTIGEN 19-9: CA 19-9: 130 U/mL — ABNORMAL HIGH (ref 0–35)

## 2020-03-15 LAB — IRON AND TIBC
Iron: 69 ug/dL (ref 41–142)
Saturation Ratios: 21 % (ref 21–57)
TIBC: 328 ug/dL (ref 236–444)
UIBC: 259 ug/dL (ref 120–384)

## 2020-03-15 LAB — FERRITIN: Ferritin: 77 ng/mL (ref 11–307)

## 2020-03-18 ENCOUNTER — Encounter: Payer: Self-pay | Admitting: *Deleted

## 2020-03-18 NOTE — Progress Notes (Signed)
Patient MRI scheduled for 04/12/2020. Called patient and reviewed appointment date, time and location. Also reviewed NPO status with patient. Will also mail appointment information and scan prep to patient's home for reinforcement.  Oncology Nurse Navigator Documentation  Oncology Nurse Navigator Flowsheets 03/18/2020  Abnormal Finding Date -  Diagnosis Status -  Planned Course of Treatment -  Phase of Treatment -  Radiation Actual Start Date: -  Radiation Expected End Date: -  Radiation Actual End Date: -  Navigator Follow Up Date: 04/12/2020  Navigator Follow Up Reason: Scan Review  Navigation Complete Date: -  Navigator Location [facility]  Referral Date to RadOnc/MedOnc -  Navigator Encounter Type Telephone;Appt/Treatment Plan Review  Telephone Outgoing Call;Appt Confirmation/Clarification  Patient Visit Type MedOnc  Treatment Phase Post-Tx Follow-up  Barriers/Navigation Needs Anxiety;Coordination of Care;Education  Education Other  Interventions Coordination of Care;Education  Acuity Level 2-Minimal Needs (1-2 Barriers Identified)  Referrals -  Coordination of Care Appts;Radiology  Education Method Verbal;Written  Support Groups/Services Friends and Family  Time Spent with Patient 24

## 2020-03-19 NOTE — Progress Notes (Signed)
Oncology Nurse Navigator Documentation  Oncology Nurse Navigator Flowsheets 03/18/2020  Abnormal Finding Date -  Diagnosis Status -  Planned Course of Treatment -  Phase of Treatment -  Radiation Actual Start Date: -  Radiation Expected End Date: -  Radiation Actual End Date: -  Navigator Follow Up Date: 04/12/2020  Navigator Follow Up Reason: Scan Review  Navigation Complete Date: -  Navigator Location [facility]  Referral Date to RadOnc/MedOnc -  Navigator Encounter Type Telephone;Appt/Treatment Plan Review  Telephone Outgoing Call;Appt Confirmation/Clarification  Patient Visit Type MedOnc  Treatment Phase Post-Tx Follow-up  Barriers/Navigation Needs Anxiety;Coordination of Care;Education  Education Other  Interventions Coordination of Care;Education  Acuity Level 2-Minimal Needs (1-2 Barriers Identified)  Referrals -  Coordination of Care Appts;Radiology  Education Method Verbal;Written  Support Groups/Services Friends and Family  Time Spent with Patient 30

## 2020-04-01 ENCOUNTER — Encounter: Payer: Self-pay | Admitting: *Deleted

## 2020-04-01 NOTE — Progress Notes (Signed)
Patient called with several complaints over the last few days, which have been progressively getting worse. Patient has been lightheaded and dizzy. Last night she said she had a hard time ambulating due to dizziness. She also been getting more nauseated. She states last night and this morning she is intensely nauseous with a strong desire to vomit, but she is unable to vomit. She also notes discomfort along the top of her abdomen. She states her BP is high for her, at 160/70 with a pulse of 78.  Spoke to Judson Roch about all these complaints. Judson Roch would like her to come in tomorrow for labs, assessment and possible IVF. Patient aware of appointments.  Oncology Nurse Navigator Documentation  Oncology Nurse Navigator Flowsheets 04/01/2020  Abnormal Finding Date -  Diagnosis Status -  Planned Course of Treatment -  Phase of Treatment -  Radiation Actual Start Date: -  Radiation Expected End Date: -  Radiation Actual End Date: -  Navigator Follow Up Date: 04/02/2020  Navigator Follow Up Reason: Symptom Management  Navigation Complete Date: -  Navigator Location [facility]  Referral Date to RadOnc/MedOnc -  Navigator Encounter Type Telephone  Telephone Symptom Mgt;Incoming Call  Patient Visit Type MedOnc  Treatment Phase Post-Tx Follow-up  Barriers/Navigation Needs Anxiety;Coordination of Care;Education  Education -  Interventions Coordination of Care;Psycho-Social Support  Acuity Level 2-Minimal Needs (1-2 Barriers Identified)  Referrals -  Coordination of Care Appts  Education Method -  Support Groups/Services Friends and Family  Time Spent with Patient 30

## 2020-04-02 ENCOUNTER — Ambulatory Visit: Payer: Medicare Other

## 2020-04-02 ENCOUNTER — Inpatient Hospital Stay: Payer: Medicare Other

## 2020-04-02 ENCOUNTER — Inpatient Hospital Stay (HOSPITAL_COMMUNITY): Payer: Medicare Other

## 2020-04-02 ENCOUNTER — Other Ambulatory Visit (HOSPITAL_BASED_OUTPATIENT_CLINIC_OR_DEPARTMENT_OTHER): Payer: Self-pay

## 2020-04-02 ENCOUNTER — Encounter: Payer: Self-pay | Admitting: *Deleted

## 2020-04-02 ENCOUNTER — Inpatient Hospital Stay (HOSPITAL_BASED_OUTPATIENT_CLINIC_OR_DEPARTMENT_OTHER)
Admission: EM | Admit: 2020-04-02 | Discharge: 2020-04-13 | DRG: 308 | Disposition: A | Discharge status: Skilled Nursing Facility | Payer: Medicare Other | Source: Ambulatory Visit | Attending: Internal Medicine | Admitting: Internal Medicine

## 2020-04-02 ENCOUNTER — Encounter (HOSPITAL_BASED_OUTPATIENT_CLINIC_OR_DEPARTMENT_OTHER): Payer: Self-pay | Admitting: *Deleted

## 2020-04-02 ENCOUNTER — Inpatient Hospital Stay (HOSPITAL_BASED_OUTPATIENT_CLINIC_OR_DEPARTMENT_OTHER): Payer: Medicare Other | Admitting: Family

## 2020-04-02 ENCOUNTER — Encounter: Payer: Self-pay | Admitting: Family

## 2020-04-02 ENCOUNTER — Other Ambulatory Visit: Payer: Self-pay

## 2020-04-02 ENCOUNTER — Emergency Department (HOSPITAL_BASED_OUTPATIENT_CLINIC_OR_DEPARTMENT_OTHER): Payer: Medicare Other

## 2020-04-02 VITALS — BP 99/61 | HR 130 | Temp 98.8°F | Resp 19 | Wt 96.0 lb

## 2020-04-02 DIAGNOSIS — Z880 Allergy status to penicillin: Secondary | ICD-10-CM | POA: Diagnosis not present

## 2020-04-02 DIAGNOSIS — Z8249 Family history of ischemic heart disease and other diseases of the circulatory system: Secondary | ICD-10-CM

## 2020-04-02 DIAGNOSIS — R6520 Severe sepsis without septic shock: Secondary | ICD-10-CM

## 2020-04-02 DIAGNOSIS — R399 Unspecified symptoms and signs involving the genitourinary system: Secondary | ICD-10-CM | POA: Diagnosis not present

## 2020-04-02 DIAGNOSIS — Z9889 Other specified postprocedural states: Secondary | ICD-10-CM | POA: Diagnosis not present

## 2020-04-02 DIAGNOSIS — I313 Pericardial effusion (noninflammatory): Secondary | ICD-10-CM | POA: Diagnosis present

## 2020-04-02 DIAGNOSIS — Z888 Allergy status to other drugs, medicaments and biological substances status: Secondary | ICD-10-CM

## 2020-04-02 DIAGNOSIS — R140 Abdominal distension (gaseous): Secondary | ICD-10-CM | POA: Diagnosis not present

## 2020-04-02 DIAGNOSIS — D631 Anemia in chronic kidney disease: Secondary | ICD-10-CM | POA: Diagnosis present

## 2020-04-02 DIAGNOSIS — D509 Iron deficiency anemia, unspecified: Secondary | ICD-10-CM | POA: Diagnosis present

## 2020-04-02 DIAGNOSIS — E039 Hypothyroidism, unspecified: Secondary | ICD-10-CM | POA: Diagnosis present

## 2020-04-02 DIAGNOSIS — K858 Other acute pancreatitis without necrosis or infection: Secondary | ICD-10-CM | POA: Diagnosis not present

## 2020-04-02 DIAGNOSIS — N184 Chronic kidney disease, stage 4 (severe): Secondary | ICD-10-CM

## 2020-04-02 DIAGNOSIS — N390 Urinary tract infection, site not specified: Secondary | ICD-10-CM | POA: Diagnosis not present

## 2020-04-02 DIAGNOSIS — I480 Paroxysmal atrial fibrillation: Secondary | ICD-10-CM | POA: Diagnosis present

## 2020-04-02 DIAGNOSIS — R933 Abnormal findings on diagnostic imaging of other parts of digestive tract: Secondary | ICD-10-CM

## 2020-04-02 DIAGNOSIS — R778 Other specified abnormalities of plasma proteins: Secondary | ICD-10-CM | POA: Diagnosis not present

## 2020-04-02 DIAGNOSIS — Z681 Body mass index (BMI) 19 or less, adult: Secondary | ICD-10-CM | POA: Diagnosis not present

## 2020-04-02 DIAGNOSIS — C259 Malignant neoplasm of pancreas, unspecified: Secondary | ICD-10-CM | POA: Diagnosis not present

## 2020-04-02 DIAGNOSIS — Z4689 Encounter for fitting and adjustment of other specified devices: Secondary | ICD-10-CM

## 2020-04-02 DIAGNOSIS — N179 Acute kidney failure, unspecified: Secondary | ICD-10-CM | POA: Diagnosis present

## 2020-04-02 DIAGNOSIS — Z20822 Contact with and (suspected) exposure to covid-19: Secondary | ICD-10-CM | POA: Diagnosis present

## 2020-04-02 DIAGNOSIS — I4891 Unspecified atrial fibrillation: Secondary | ICD-10-CM | POA: Diagnosis present

## 2020-04-02 DIAGNOSIS — I429 Cardiomyopathy, unspecified: Secondary | ICD-10-CM | POA: Diagnosis present

## 2020-04-02 DIAGNOSIS — I4510 Unspecified right bundle-branch block: Secondary | ICD-10-CM | POA: Diagnosis present

## 2020-04-02 DIAGNOSIS — Z66 Do not resuscitate: Secondary | ICD-10-CM | POA: Diagnosis present

## 2020-04-02 DIAGNOSIS — E876 Hypokalemia: Secondary | ICD-10-CM | POA: Diagnosis present

## 2020-04-02 DIAGNOSIS — Z87898 Personal history of other specified conditions: Secondary | ICD-10-CM

## 2020-04-02 DIAGNOSIS — D630 Anemia in neoplastic disease: Secondary | ICD-10-CM | POA: Diagnosis present

## 2020-04-02 DIAGNOSIS — Z4659 Encounter for fitting and adjustment of other gastrointestinal appliance and device: Secondary | ICD-10-CM | POA: Diagnosis not present

## 2020-04-02 DIAGNOSIS — K315 Obstruction of duodenum: Secondary | ICD-10-CM | POA: Diagnosis present

## 2020-04-02 DIAGNOSIS — Z23 Encounter for immunization: Secondary | ICD-10-CM | POA: Diagnosis present

## 2020-04-02 DIAGNOSIS — E43 Unspecified severe protein-calorie malnutrition: Secondary | ICD-10-CM | POA: Diagnosis present

## 2020-04-02 DIAGNOSIS — C250 Malignant neoplasm of head of pancreas: Secondary | ICD-10-CM | POA: Diagnosis present

## 2020-04-02 DIAGNOSIS — C679 Malignant neoplasm of bladder, unspecified: Secondary | ICD-10-CM | POA: Diagnosis present

## 2020-04-02 DIAGNOSIS — N136 Pyonephrosis: Secondary | ICD-10-CM | POA: Diagnosis present

## 2020-04-02 DIAGNOSIS — C670 Malignant neoplasm of trigone of bladder: Secondary | ICD-10-CM

## 2020-04-02 DIAGNOSIS — A419 Sepsis, unspecified organism: Secondary | ICD-10-CM | POA: Diagnosis present

## 2020-04-02 DIAGNOSIS — K8000 Calculus of gallbladder with acute cholecystitis without obstruction: Secondary | ICD-10-CM | POA: Diagnosis present

## 2020-04-02 DIAGNOSIS — K831 Obstruction of bile duct: Secondary | ICD-10-CM | POA: Diagnosis present

## 2020-04-02 DIAGNOSIS — I129 Hypertensive chronic kidney disease with stage 1 through stage 4 chronic kidney disease, or unspecified chronic kidney disease: Secondary | ICD-10-CM | POA: Diagnosis present

## 2020-04-02 DIAGNOSIS — N189 Chronic kidney disease, unspecified: Secondary | ICD-10-CM | POA: Diagnosis present

## 2020-04-02 DIAGNOSIS — K810 Acute cholecystitis: Secondary | ICD-10-CM

## 2020-04-02 DIAGNOSIS — C257 Malignant neoplasm of other parts of pancreas: Secondary | ICD-10-CM

## 2020-04-02 DIAGNOSIS — Z923 Personal history of irradiation: Secondary | ICD-10-CM

## 2020-04-02 DIAGNOSIS — G40909 Epilepsy, unspecified, not intractable, without status epilepticus: Secondary | ICD-10-CM | POA: Diagnosis present

## 2020-04-02 DIAGNOSIS — Z9049 Acquired absence of other specified parts of digestive tract: Secondary | ICD-10-CM

## 2020-04-02 DIAGNOSIS — K8001 Calculus of gallbladder with acute cholecystitis with obstruction: Secondary | ICD-10-CM | POA: Diagnosis not present

## 2020-04-02 LAB — I-STAT CHEM 8, ED
BUN: 41 mg/dL — ABNORMAL HIGH (ref 8–23)
Calcium, Ion: 1.19 mmol/L (ref 1.15–1.40)
Chloride: 99 mmol/L (ref 98–111)
Creatinine, Ser: 2 mg/dL — ABNORMAL HIGH (ref 0.44–1.00)
Glucose, Bld: 194 mg/dL — ABNORMAL HIGH (ref 70–99)
HCT: 32 % — ABNORMAL LOW (ref 36.0–46.0)
Hemoglobin: 10.9 g/dL — ABNORMAL LOW (ref 12.0–15.0)
Potassium: 4.1 mmol/L (ref 3.5–5.1)
Sodium: 135 mmol/L (ref 135–145)
TCO2: 22 mmol/L (ref 22–32)

## 2020-04-02 LAB — MAGNESIUM: Magnesium: 1.7 mg/dL (ref 1.7–2.4)

## 2020-04-02 LAB — CBC WITH DIFFERENTIAL (CANCER CENTER ONLY)
Abs Immature Granulocytes: 0.31 10*3/uL — ABNORMAL HIGH (ref 0.00–0.07)
Basophils Absolute: 0 10*3/uL (ref 0.0–0.1)
Basophils Relative: 0 %
Eosinophils Absolute: 0 10*3/uL (ref 0.0–0.5)
Eosinophils Relative: 0 %
HCT: 33.6 % — ABNORMAL LOW (ref 36.0–46.0)
Hemoglobin: 11 g/dL — ABNORMAL LOW (ref 12.0–15.0)
Immature Granulocytes: 1 %
Lymphocytes Relative: 2 %
Lymphs Abs: 0.3 10*3/uL — ABNORMAL LOW (ref 0.7–4.0)
MCH: 32 pg (ref 26.0–34.0)
MCHC: 32.7 g/dL (ref 30.0–36.0)
MCV: 97.7 fL (ref 80.0–100.0)
Monocytes Absolute: 1.4 10*3/uL — ABNORMAL HIGH (ref 0.1–1.0)
Monocytes Relative: 6 %
Neutro Abs: 20.1 10*3/uL — ABNORMAL HIGH (ref 1.7–7.7)
Neutrophils Relative %: 91 %
Platelet Count: 172 10*3/uL (ref 150–400)
RBC: 3.44 MIL/uL — ABNORMAL LOW (ref 3.87–5.11)
RDW: 12.5 % (ref 11.5–15.5)
WBC Count: 22.2 10*3/uL — ABNORMAL HIGH (ref 4.0–10.5)
nRBC: 0 % (ref 0.0–0.2)

## 2020-04-02 LAB — URINALYSIS, ROUTINE W REFLEX MICROSCOPIC
Glucose, UA: NEGATIVE mg/dL
Ketones, ur: NEGATIVE mg/dL
Nitrite: NEGATIVE
Protein, ur: 100 mg/dL — AB
Specific Gravity, Urine: 1.015 (ref 1.005–1.030)
pH: 7.5 (ref 5.0–8.0)

## 2020-04-02 LAB — DIFFERENTIAL
Abs Immature Granulocytes: 0.22 10*3/uL — ABNORMAL HIGH (ref 0.00–0.07)
Basophils Absolute: 0 10*3/uL (ref 0.0–0.1)
Basophils Relative: 0 %
Eosinophils Absolute: 0 10*3/uL (ref 0.0–0.5)
Eosinophils Relative: 0 %
Immature Granulocytes: 1 %
Lymphocytes Relative: 2 %
Lymphs Abs: 0.3 10*3/uL — ABNORMAL LOW (ref 0.7–4.0)
Monocytes Absolute: 1.3 10*3/uL — ABNORMAL HIGH (ref 0.1–1.0)
Monocytes Relative: 6 %
Neutro Abs: 19 10*3/uL — ABNORMAL HIGH (ref 1.7–7.7)
Neutrophils Relative %: 91 %

## 2020-04-02 LAB — CMP (CANCER CENTER ONLY)
ALT: 121 U/L — ABNORMAL HIGH (ref 0–44)
AST: 47 U/L — ABNORMAL HIGH (ref 15–41)
Albumin: 3.5 g/dL (ref 3.5–5.0)
Alkaline Phosphatase: 270 U/L — ABNORMAL HIGH (ref 38–126)
Anion gap: 12 (ref 5–15)
BUN: 43 mg/dL — ABNORMAL HIGH (ref 8–23)
CO2: 22 mmol/L (ref 22–32)
Calcium: 9.7 mg/dL (ref 8.9–10.3)
Chloride: 99 mmol/L (ref 98–111)
Creatinine: 1.88 mg/dL — ABNORMAL HIGH (ref 0.44–1.00)
GFR, Estimated: 25 mL/min — ABNORMAL LOW (ref >60)
Glucose, Bld: 242 mg/dL — ABNORMAL HIGH (ref 70–99)
Potassium: 4.5 mmol/L (ref 3.5–5.1)
Sodium: 133 mmol/L — ABNORMAL LOW (ref 135–145)
Total Bilirubin: 3.4 mg/dL — ABNORMAL HIGH (ref 0.3–1.2)
Total Protein: 7.8 g/dL (ref 6.5–8.1)

## 2020-04-02 LAB — TROPONIN I (HIGH SENSITIVITY)
Troponin I (High Sensitivity): 28 ng/L — ABNORMAL HIGH (ref <18)
Troponin I (High Sensitivity): 21 ng/L — ABNORMAL HIGH (ref <18)
Troponin I (High Sensitivity): 20 ng/L — ABNORMAL HIGH (ref <18)
Troponin I (High Sensitivity): 20 ng/L — ABNORMAL HIGH (ref <18)

## 2020-04-02 LAB — RESP PANEL BY RT PCR (RSV, FLU A&B, COVID)
Influenza A by PCR: NEGATIVE
Influenza B by PCR: NEGATIVE
Respiratory Syncytial Virus by PCR: NEGATIVE
SARS Coronavirus 2 by RT PCR: NEGATIVE

## 2020-04-02 LAB — LIPASE, BLOOD: Lipase: 44 U/L (ref 11–51)

## 2020-04-02 LAB — PROCALCITONIN: Procalcitonin: 6.26 ng/mL

## 2020-04-02 LAB — AMYLASE: Amylase: 57 U/L (ref 28–100)

## 2020-04-02 LAB — CBC
HCT: 31.9 % — ABNORMAL LOW (ref 36.0–46.0)
Hemoglobin: 10.4 g/dL — ABNORMAL LOW (ref 12.0–15.0)
MCH: 31.9 pg (ref 26.0–34.0)
MCHC: 32.6 g/dL (ref 30.0–36.0)
MCV: 97.9 fL (ref 80.0–100.0)
Platelets: 159 10*3/uL (ref 150–400)
RBC: 3.26 MIL/uL — ABNORMAL LOW (ref 3.87–5.11)
RDW: 12.7 % (ref 11.5–15.5)
WBC: 20.8 10*3/uL — ABNORMAL HIGH (ref 4.0–10.5)
nRBC: 0 % (ref 0.0–0.2)

## 2020-04-02 LAB — LACTIC ACID, PLASMA
Lactic Acid, Venous: 2.1 mmol/L (ref 0.5–1.9)
Lactic Acid, Venous: 1.4 mmol/L (ref 0.5–1.9)

## 2020-04-02 LAB — BRAIN NATRIURETIC PEPTIDE: B Natriuretic Peptide: 321.5 pg/mL — ABNORMAL HIGH (ref 0.0–100.0)

## 2020-04-02 LAB — URINALYSIS, MICROSCOPIC (REFLEX)

## 2020-04-02 LAB — PROTIME-INR
INR: 1.2 (ref 0.8–1.2)
Prothrombin Time: 14.9 seconds (ref 11.4–15.2)

## 2020-04-02 LAB — APTT: aPTT: 39 seconds — ABNORMAL HIGH (ref 24–36)

## 2020-04-02 IMAGING — DX DG ABDOMEN 1V
2 series · 2 of 2 positions shown · non-contrast
Comparison: None.

CLINICAL DATA: Abdominal distension.

EXAM:
ABDOMEN - 1 VIEW

[abdomen kub (1 of 2)]
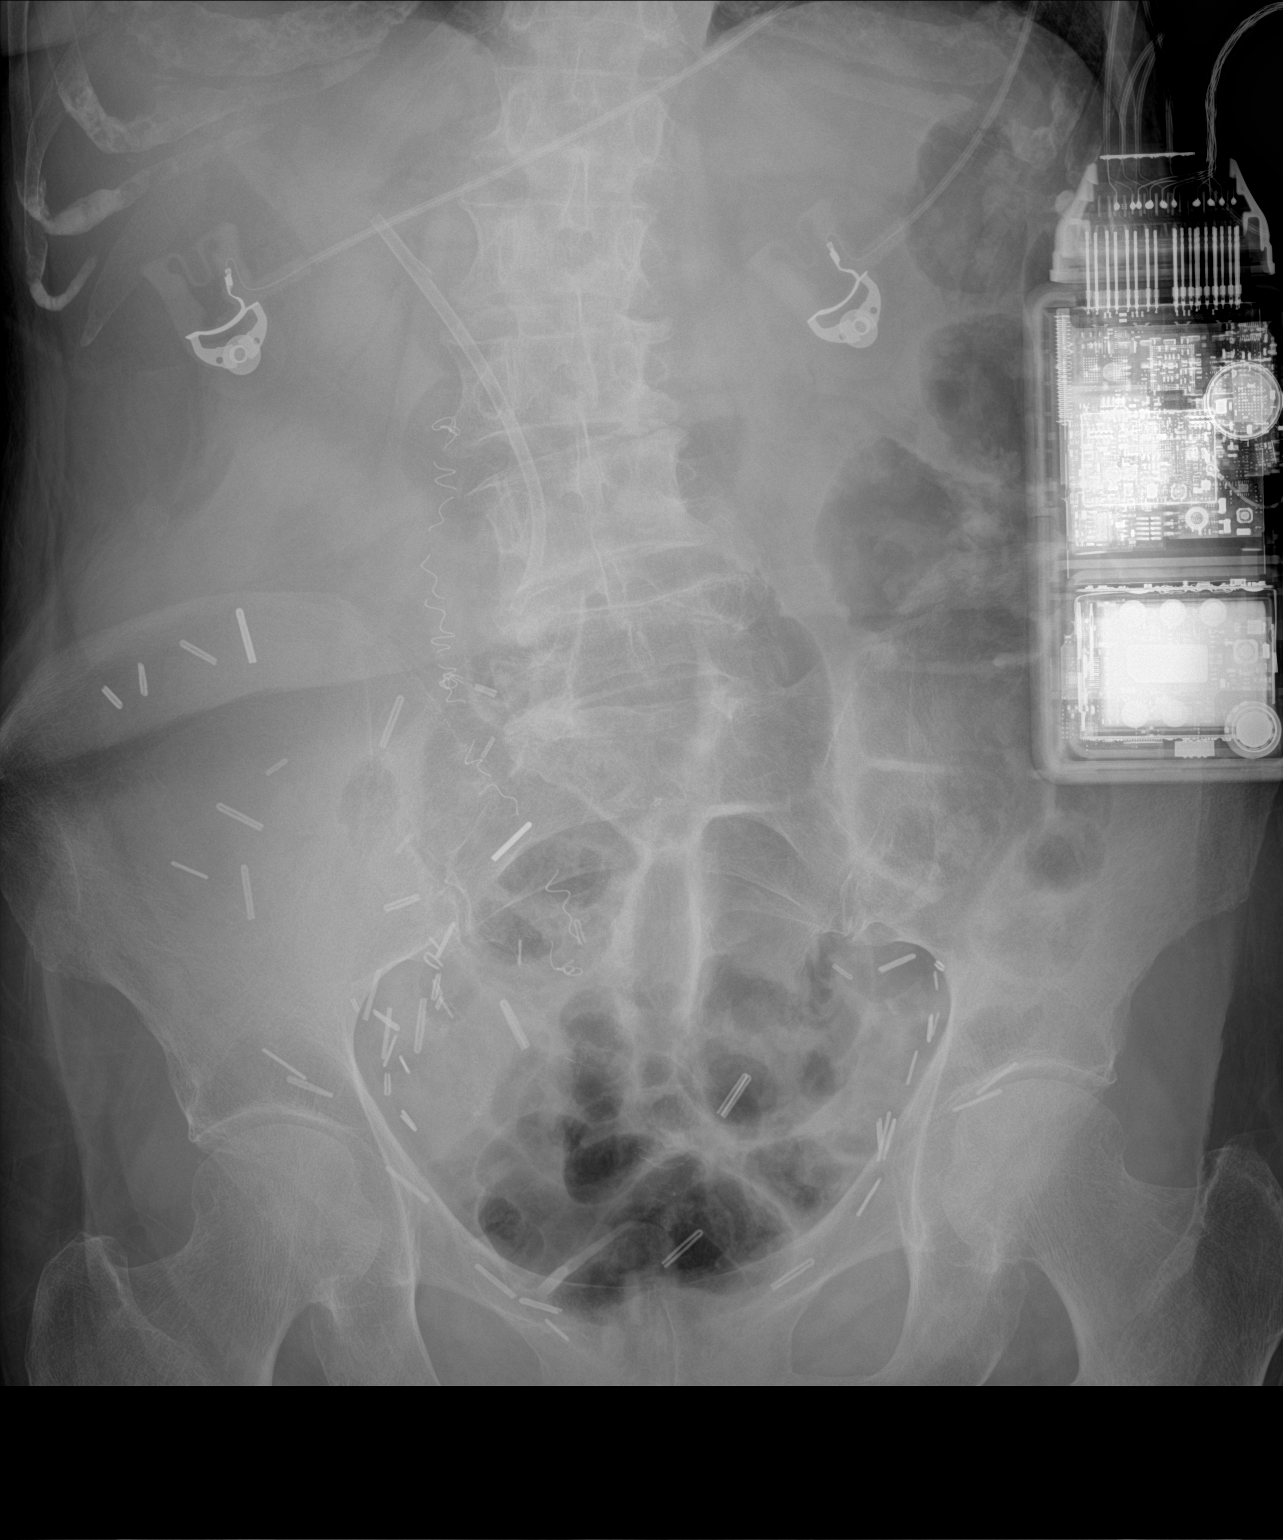

[abdomen kub (2 of 2)]
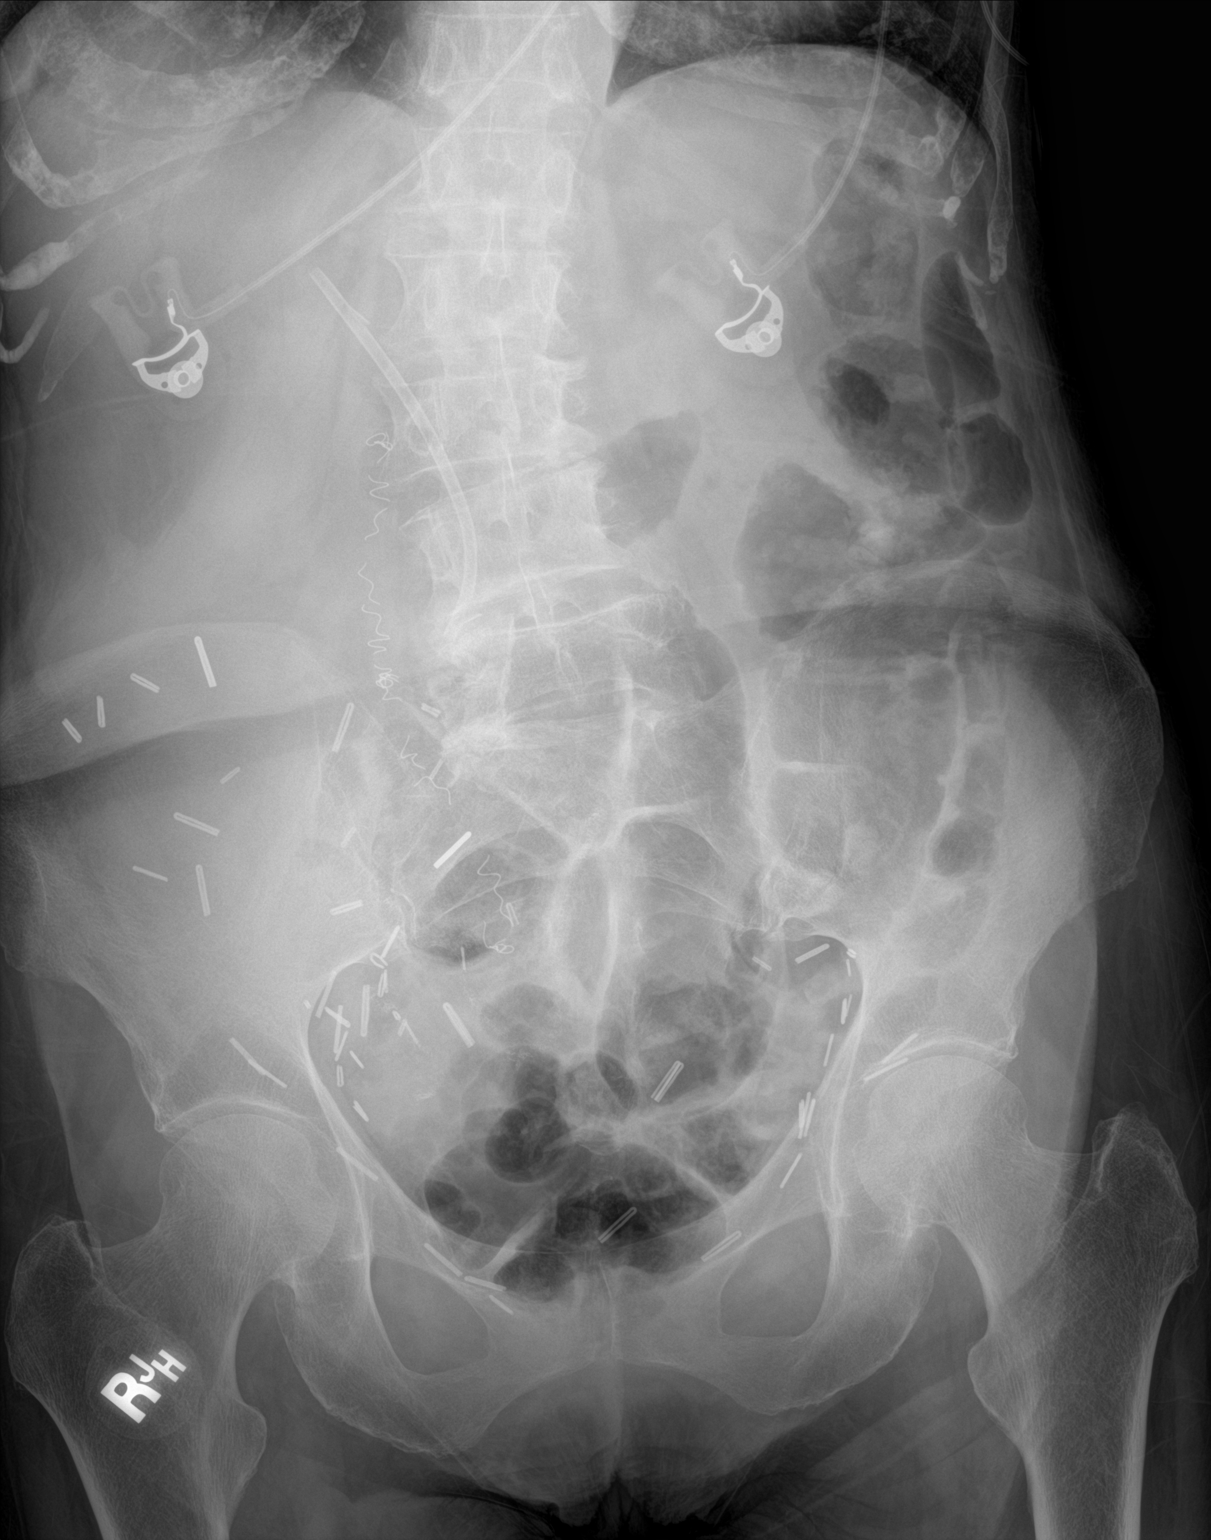

[2 of 2 positions shown; findings below may reference images not displayed]

FINDINGS: The bowel gas pattern is normal. A radiopaque biliary stent is seen
overlying the right upper quadrant. No radio-opaque calculi or other
significant radiographic abnormality are seen. Numerous radiopaque
surgical clips are seen overlying the pelvis and right lower
quadrant.
IMPRESSION: 1. Postoperative changes without evidence of bowel obstruction.

## 2020-04-02 IMAGING — DX DG CHEST 1V PORT
1 series · 1 of 1 positions shown · non-contrast
Comparison: [DATE].

CLINICAL DATA: Atrial fibrillation.

EXAM:
PORTABLE CHEST 1 VIEW

[chest ap]
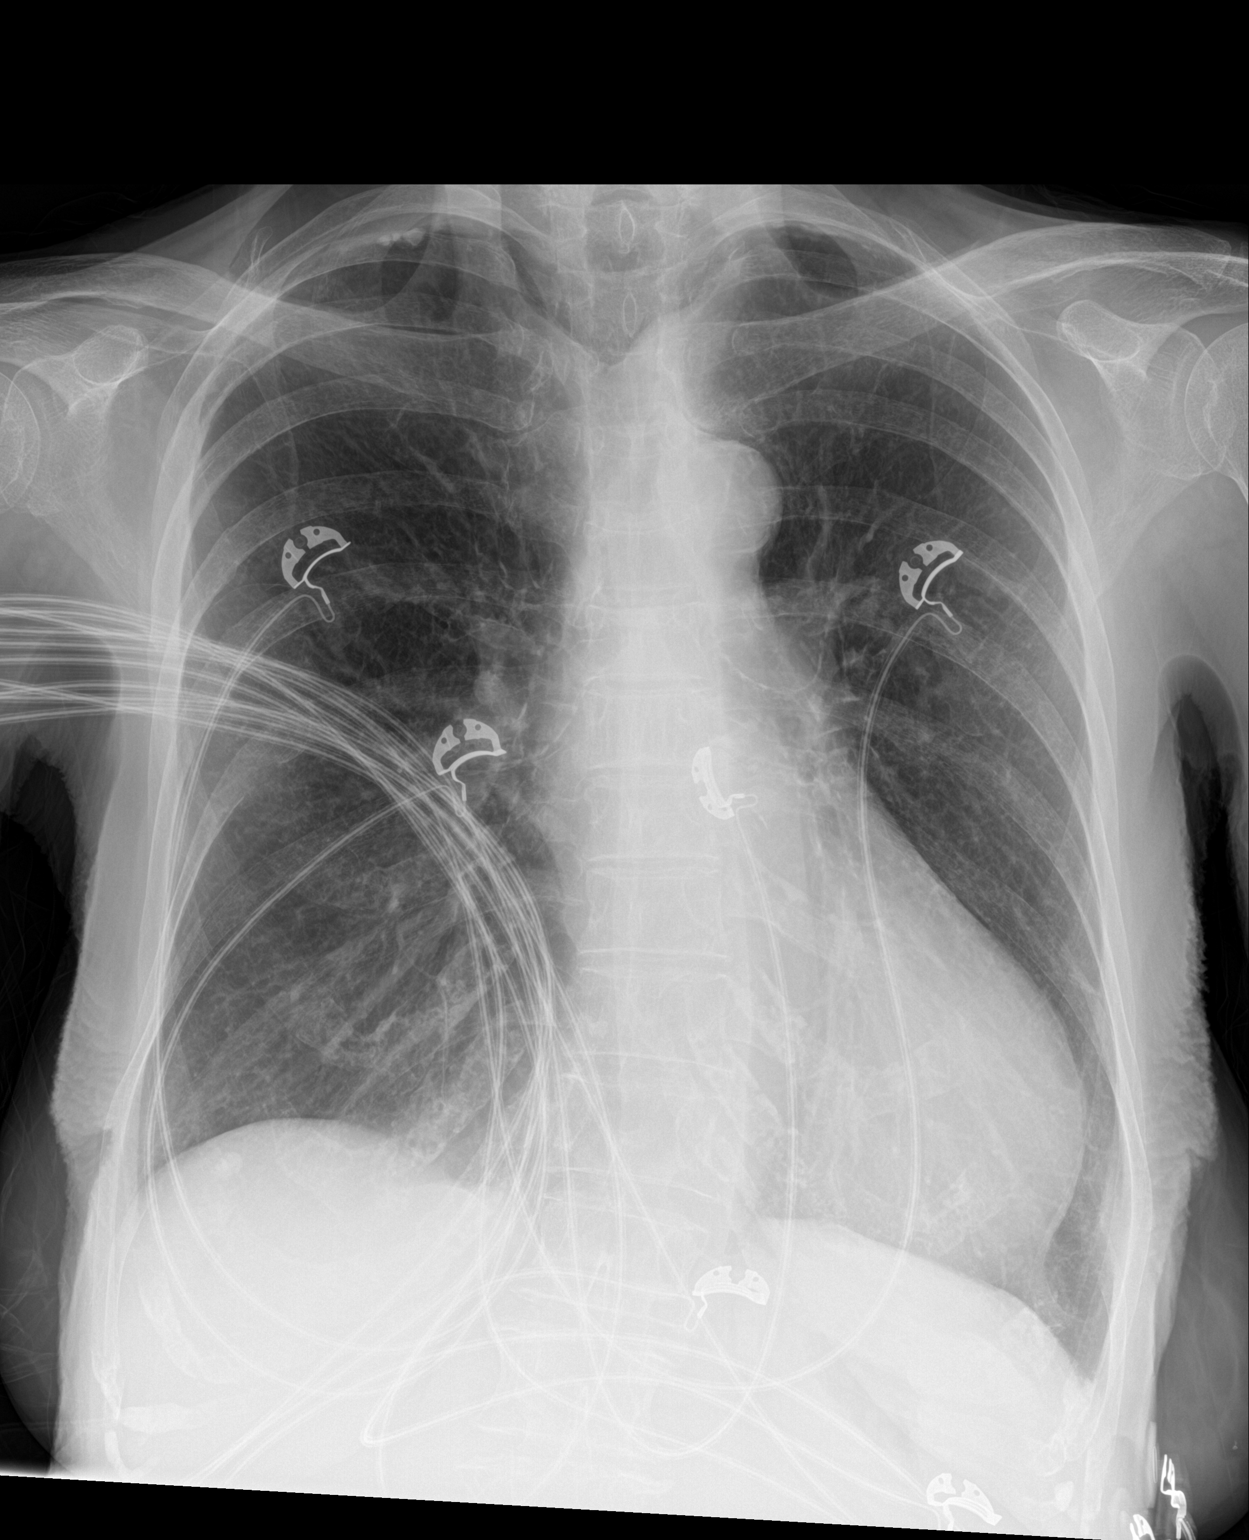

[1 of 1 positions shown; findings below may reference images not displayed]

FINDINGS: The heart size and mediastinal contours are within normal limits.
Both lungs are clear. No pneumothorax or pleural effusion is noted.
The visualized skeletal structures are unremarkable.
IMPRESSION: No active disease.

## 2020-04-02 MED ORDER — SODIUM CHLORIDE 0.9 % IV BOLUS
500.0000 mL | Freq: Once | INTRAVENOUS | Status: AC
  Administered 2020-04-02: 500 mL via INTRAVENOUS

## 2020-04-02 MED ORDER — ACETAMINOPHEN 325 MG PO TABS: 650.0000 mg | ORAL_TABLET | Freq: Four times a day (QID) | ORAL | Status: DC | PRN

## 2020-04-02 MED ORDER — LEVETIRACETAM 500 MG PO TABS
500.0000 mg | ORAL_TABLET | Freq: Two times a day (BID) | ORAL | Status: DC
  Administered 2020-04-02 – 2020-04-13 (×21): 500 mg via ORAL
  Filled 2020-04-02 (×21): qty 1

## 2020-04-02 MED ORDER — DILTIAZEM HCL-DEXTROSE 125-5 MG/125ML-% IV SOLN (PREMIX)
5.0000 mg/h | INTRAVENOUS | Status: DC
  Administered 2020-04-02 – 2020-04-03 (×3): 5 mg/h via INTRAVENOUS
  Filled 2020-04-02 (×2): qty 125

## 2020-04-02 MED ORDER — MELATONIN 5 MG PO TABS
10.0000 mg | ORAL_TABLET | Freq: Every day | ORAL | Status: DC
  Administered 2020-04-02 – 2020-04-12 (×11): 10 mg via ORAL
  Filled 2020-04-02 (×11): qty 2

## 2020-04-02 MED ORDER — SODIUM CHLORIDE 0.9 % IV SOLN
1.0000 g | INTRAVENOUS | Status: AC
  Administered 2020-04-02 – 2020-04-04 (×3): 1 g via INTRAVENOUS
  Filled 2020-04-02: qty 1
  Filled 2020-04-02 (×2): qty 10

## 2020-04-02 MED ORDER — HEPARIN BOLUS VIA INFUSION
1400.0000 [IU] | Freq: Once | INTRAVENOUS | Status: AC
  Administered 2020-04-02: 1400 [IU] via INTRAVENOUS

## 2020-04-02 MED ORDER — SODIUM CHLORIDE 0.9 % IV SOLN
1.0000 g | Freq: Once | INTRAVENOUS | Status: AC
  Administered 2020-04-02: 1 g via INTRAVENOUS
  Filled 2020-04-02: qty 1

## 2020-04-02 MED ORDER — FERROUS SULFATE 325 (65 FE) MG PO TABS
325.0000 mg | ORAL_TABLET | Freq: Every day | ORAL | Status: DC
  Administered 2020-04-03 – 2020-04-04 (×2): 325 mg via ORAL
  Filled 2020-04-02 (×2): qty 1

## 2020-04-02 MED ORDER — ACETAMINOPHEN 650 MG RE SUPP: 650.0000 mg | Freq: Four times a day (QID) | RECTAL | Status: DC | PRN

## 2020-04-02 MED ORDER — METOPROLOL TARTRATE 25 MG PO TABS
12.5000 mg | ORAL_TABLET | Freq: Two times a day (BID) | ORAL | Status: DC
  Administered 2020-04-03: 12.5 mg via ORAL
  Filled 2020-04-02: qty 1

## 2020-04-02 MED ORDER — SODIUM BICARBONATE 650 MG PO TABS
650.0000 mg | ORAL_TABLET | Freq: Two times a day (BID) | ORAL | Status: DC
  Administered 2020-04-02 – 2020-04-13 (×21): 650 mg via ORAL
  Filled 2020-04-02 (×21): qty 1

## 2020-04-02 MED ORDER — HEPARIN (PORCINE) 25000 UT/250ML-% IV SOLN
750.0000 [IU]/h | INTRAVENOUS | Status: DC
  Administered 2020-04-02 (×2): 600 [IU]/h via INTRAVENOUS
  Filled 2020-04-02: qty 250

## 2020-04-02 MED ORDER — LEVOTHYROXINE SODIUM 100 MCG PO TABS
100.0000 ug | ORAL_TABLET | Freq: Every day | ORAL | Status: DC
  Administered 2020-04-03 – 2020-04-13 (×9): 100 ug via ORAL
  Filled 2020-04-02 (×9): qty 1

## 2020-04-02 MED ORDER — SODIUM CHLORIDE 0.9 % IV SOLN
75.0000 mL/h | INTRAVENOUS | Status: AC
  Administered 2020-04-02: 75 mL/h via INTRAVENOUS

## 2020-04-02 MED ORDER — HYDROCODONE-ACETAMINOPHEN 5-325 MG PO TABS
1.0000 | ORAL_TABLET | ORAL | Status: DC | PRN
  Administered 2020-04-04: 1 via ORAL
  Administered 2020-04-06: 2 via ORAL
  Filled 2020-04-02: qty 1
  Filled 2020-04-02: qty 2

## 2020-04-02 NOTE — Progress Notes (Signed)
Visited with patient prior to seeing provider. She is feeling very poorly. Her WBCs are elevated, but talking with patient she has no clear symptoms which would point to a source of infection. After seeing provider, patient was sent to the ED due to multiple lab abnormalities and new onset afib.   Oncology Nurse Navigator Documentation  Oncology Nurse Navigator Flowsheets 04/02/2020  Abnormal Finding Date -  Diagnosis Status -  Planned Course of Treatment -  Phase of Treatment -  Radiation Actual Start Date: -  Radiation Expected End Date: -  Radiation Actual End Date: -  Navigator Follow Up Date: 04/26/2020  Navigator Follow Up Reason: Follow-up Appointment  Navigation Complete Date: -  Navigator Location [facility]  Referral Date to RadOnc/MedOnc -  Navigator Encounter Type Follow-up Appt  Telephone Symptom Mgt  Patient Visit Type MedOnc  Treatment Phase Post-Tx Follow-up  Barriers/Navigation Needs Anxiety;Coordination of Care;Education  Education -  Interventions Psycho-Social Support  Acuity Level 2-Minimal Needs (1-2 Barriers Identified)  Referrals -  Coordination of Care -  Education Method -  Support Groups/Services Friends and Family  Time Spent with Patient 30

## 2020-04-02 NOTE — Treatment Plan (Signed)
Med center [facility] transfer from Dr. Ralene Bathe  Patient Glenda King with a history of pancreatic cancer date of birth [birth year] presented directly from her PCP office for new onset A. fib.  Likely has a UTI given her dirty urine and self cathing.  Unfortunately only one blood culture was obtained prior to initiation of antibiotics.  Has an amoxicillin allergy so she was given IV aztreonam.  A. fib is being managed with a Cardizem drip now.  Will be started on anticoagulation.  Does not have a cardiologist but may need cardiology consultation for further assessment and evaluation.  Per EDP appears dehydrated given her leukocytosis.  Accepted to telemetry bed and will need PT OT evaluation once her heart rate is improved.  She presented with feeling unwell, chills, malaise and lightheadedness.  Patient has been given fluids and antibiotics.  She is accepted to a telemetry bed as an inpatient

## 2020-04-02 NOTE — Progress Notes (Signed)
Hematology and Oncology Follow Up Visit  Glenda King [id number] [birth year] 86 y.o. 04/02/2020   Principle Diagnosis:  Pancreatic cancer-localized  Current Therapy:        Status post radiosurgery-completed on 01/29/2020.     Interim History:  Glenda King is here today for symptom management. She has noted lightheadedness, palpitations, weakness, chills and feeling shaky when she gets up or exerts any energy.  She has also noted pain across the top of her abdomen right under her breasts. This seems to ease up when she lays down to go to bed.  She has had nausea with dry heaves but is unable to vomit.  EKG revealed new onset of atrial fib, HR 120-130's.  Her WBC count is 22.2 (5.4 at last visit). LFT's are also significantly elevated. Amylase and Lipase added to her lab work-up.  No fever, cough, rash, chest pain or changes in bowel or bladder habits.  She states that she had bladder cancer and had her bladder removed in 2003 and a new one made from her intestine. She self caths 3-4 times a day. She has not noted any blood loss. No bruising or petechiae.  No swelling, tenderness, numbness or tingling in her extremities.  She has no appetite but is doing her best to stay well hydrated. Her weight is stable at 96 lbs.   ECOG Performance Status: 1 - Symptomatic but completely ambulatory  Medications:  Allergies as of 04/02/2020      Reactions   Gadolinium Derivatives Other (See Comments)   Secondary to her stage IV kidney disease   Amoxicillin Other (See Comments)   UNKNOWN REACTION   Pollen Extract Other (See Comments)   Sneezing      Medication List       Accurate as of April 02, 2020 11:10 AM. If you have any questions, ask your nurse or doctor.        calcium carbonate 1250 (500 Ca) MG chewable tablet Commonly known as: OS-CAL Chew 1,250 mg by mouth in the morning, at noon, and at bedtime.   cholecalciferol 25 MCG (1000 UNIT) tablet Commonly known as:  VITAMIN D3 Take 1,000 Units by mouth 2 (two) times daily.   cyanocobalamin 1000 MCG tablet Take 1 tablet (1,000 mcg total) by mouth daily.   ferrous sulfate 325 (65 FE) MG tablet Take 1 tablet (325 mg total) by mouth daily with breakfast.   levETIRAcetam 500 MG tablet Commonly known as: KEPPRA Take 500 mg by mouth 2 (two) times daily.   levothyroxine 100 MCG tablet Commonly known as: SYNTHROID Take 100 mcg by mouth daily.   Melatonin 10 MG Caps Take 10 mg by mouth at bedtime.   metoprolol tartrate 25 MG tablet Commonly known as: LOPRESSOR Take 12.5 mg by mouth 2 (two) times daily.   sodium bicarbonate 650 MG tablet Take 650 mg by mouth 2 (two) times daily. What changed: Another medication with the same name was removed. Continue taking this medication, and follow the directions you see here. Changed by: Laverna Peace, NP       Allergies:  Allergies  Allergen Reactions  . Gadolinium Derivatives Other (See Comments)    Secondary to her stage IV kidney disease   . Amoxicillin Other (See Comments)    UNKNOWN REACTION     . Pollen Extract Other (See Comments)    Sneezing    Past Medical History, Surgical history, Social history, and Family History were reviewed and updated.  Review of Systems: All other  10 point review of systems is negative.   Physical Exam:  weight is 96 lb (43.5 kg). Her oral temperature is 98.8 F (37.1 C). Her blood pressure is 99/61 and her pulse is 130 (abnormal). Her respiration is 19 and oxygen saturation is 98%.   Wt Readings from Last 3 Encounters:  04/02/20 96 lb (43.5 kg)  03/12/20 97 lb (44 kg)  03/09/20 95 lb 1.3 oz (43.1 kg)    Ocular: Sclerae unicteric, pupils equal, round and reactive to light Ear-nose-throat: Oropharynx clear, dentition fair Lymphatic: No cervical or supraclavicular adenopathy Lungs no rales or rhonchi, good excursion bilaterally Heart regular rate and rhythm, no murmur appreciated Abd soft, tender,  positive bowel sounds, no organomegaly noted on exam MSK no focal spinal tenderness, no joint edema Neuro: non-focal, well-oriented, appropriate affect Breasts: Deferred   Lab Results  Component Value Date   WBC 22.2 (H) 04/02/2020   HGB 11.0 (L) 04/02/2020   HCT 33.6 (L) 04/02/2020   MCV 97.7 04/02/2020   PLT 172 04/02/2020   Lab Results  Component Value Date   FERRITIN 77 03/12/2020   IRON 69 03/12/2020   TIBC 328 03/12/2020   UIBC 259 03/12/2020   IRONPCTSAT 21 03/12/2020   Lab Results  Component Value Date   RBC 3.44 (L) 04/02/2020   No results found for: KPAFRELGTCHN, LAMBDASER, KAPLAMBRATIO No results found for: IGGSERUM, IGA, IGMSERUM No results found for: Odetta Pink, SPEI   Chemistry      Component Value Date/Time   NA 138 03/12/2020 1109   K 4.8 03/12/2020 1109   CL 103 03/12/2020 1109   CO2 29 03/12/2020 1109   BUN 49 (H) 03/12/2020 1109   CREATININE 1.61 (H) 03/12/2020 1109      Component Value Date/Time   CALCIUM 9.9 03/12/2020 1109   ALKPHOS 130 (H) 03/12/2020 1109   AST 23 03/12/2020 1109   ALT 27 03/12/2020 1109   BILITOT 0.4 03/12/2020 1109       Impression and Plan: Glenda King is a very charming 86 yo white female with history of pancreatic cancer.  She is here today symptomatic with new onset of atrial fib and infectious process.  Her WBC count, LFT's and bilirubin are elevated. Amylase and Lipase were added as part of work up for pancreatitis. Of note, she does have a stent in the CBD.  At this time we will send her to the ED for further work-up, treatment and hospital admission. She is in agreement with the plan and has contacted her family to update them.  I was able to give report to the charge nurse and they are ready for her in room 8. We will follow-up with her at discharge.  She was encouraged to contact our office with any questions or concerns.  Note also routed to Dr.  Marin Olp.   Laverna Peace, NP 10/22/202111:10 AM

## 2020-04-02 NOTE — ED Triage Notes (Signed)
Pt was sent from her PCP upstair with new onset of A-fib.

## 2020-04-02 NOTE — H&P (Signed)
Glenda King ZOX:[id number] DOB: 10/23/1928 DOA: 04/02/2020     PCP: Javier Glazier, MD   Outpatient Specialists:     Oncology  Dr. Marin Olp    Patient arrived to ER on 04/02/20 at 1220 Referred by Attending Toy Baker, MD   Patient coming from: home Lives alone,       Chief Complaint:   Chief Complaint  Patient presents with  . A-fib    HPI: Glenda King is a 86 y.o. female with medical history significant of  Pancreatic Ca seizure episode, HTN, hypothyroidism, sp cystectomy now self catheterizes 3-4 times a day.     Presented with fatigue for the past 3 days She has been feeling very weak lightheaded and dizzy. She has been having hard time ambulating secondary to her lightheaded she fell. She has been getting progressively more nauseated. She did not report any dysuria but patient status post cystectomy and in and out caths 4 times a day. She has been having a bit of epigastric pain. Her blood pressure has been running a bit up to 160/70 which is unusual for her. Also reports some palpitations for past few days. When patient presented to oncologist office she was noted to have EKG that showed atrial fibrillation heart rate in 120s 130s . Also noted to have white blood cell count of 22.2 Patient denies any fevers or cough she is not vaccinated for Covid. No bleeding or blood in stool. Patient has long-term problems with weight loss She has very poor appetite but weight is stable at 96 pounds She has been taking her medications but have not self cath today yet. Her blood pressure was a bit low in the office down to 90s over 60s and she was asked to go to emergency department Infectious risk factors:  Reports  severe fatigue    Has NOt been vaccinated against COVID (refused)   Initial COVID TEST  NEGATIVE   Lab Results  Component Value Date   [place] 04/02/2020   [place] NEGATIVE 11/29/2019   [place] NEGATIVE  09/03/2019   Regarding pertinent Chronic problems:  Pancreatic cancer status post radiation therapy  Bladder cancer status post cystectomy ileal conduit currently self caths 3-4 times a day  History of seizure disorder on Keppra   HTN on metoprolol  Hypothyroidism:  Lab Results  Component Value Date   TSH 3.01 10/21/2007   on synthroid  CKD stage III- baseline Cr 1.6 Estimated Creatinine Clearance: 13.4 mL/min (A) (by C-G formula based on SCr of 2 mg/dL (H)).  Lab Results  Component Value Date   CREATININE 2.00 (H) 04/02/2020   CREATININE 1.88 (H) 04/02/2020   CREATININE 1.61 (H) 03/12/2020     Chronic anemia - baseline hg Hemoglobin & Hematocrit  Recent Labs    04/02/20 1042 04/02/20 1245 04/02/20 1345  HGB 11.0* 10.9* 10.4*   While in ER: UA significant for UTI lactic acid elevated at 2.1 White cell count 20.8 Patient initially meeting sepsis criteria   Hospitalist was called for admission for new onset atrial fibrillation and sepsis  The following Work up has been ordered so far:  Orders Placed This Encounter  Procedures  . Urine culture  . Culture, blood (single)  . Resp Panel by RT PCR (RSV, Flu A&B, Covid) - Nasopharyngeal Swab  . DG Chest Portable 1 View  . CBC with Differential  . Urinalysis, Routine w reflex microscopic  . Magnesium  . Protime-INR  . Lactic acid, plasma  .  CBC  . Differential  . Urinalysis, Microscopic (reflex)  . APTT  . Heparin level (unfractionated)  . Cardiac monitoring  . Consult to hospitalist  ALL PATIENTS BEING ADMITTED/HAVING PROCEDURES NEED COVID-19 SCREENING  . heparin per pharmacy consult  . I-stat chem 8, ED (not at [facility] or [facility])  . Admit to Inpatient (patient's expected length of stay will be greater than 2 midnights or inpatient only procedure)    Following Medications were ordered in ER: Medications  diltiazem (CARDIZEM) 125 mg in dextrose 5% 125 mL (1 mg/mL) infusion (7.5 mg/hr Intravenous Rate/Dose Change  04/02/20 1439)  heparin ADULT infusion 100 units/mL (25000 units/261m sodium chloride 0.45%) (600 Units/hr Intravenous New Bag/Given 04/02/20 1531)  sodium chloride 0.9 % bolus 500 mL (0 mLs Intravenous Stopped 04/02/20 1514)  aztreonam (AZACTAM) 1 g in sodium chloride 0.9 % 100 mL IVPB (0 g Intravenous Stopped 04/02/20 1514)  sodium chloride 0.9 % bolus 500 mL (0 mLs Intravenous Stopped 04/02/20 1655)  heparin bolus via infusion 1,400 Units (1,400 Units Intravenous Bolus from Bag 04/02/20 1532)    Significant initial  Findings: Abnormal Labs Reviewed  URINALYSIS, ROUTINE W REFLEX MICROSCOPIC - Abnormal; Notable for the following components:      Result Value   APPearance CLOUDY (*)    Hgb urine dipstick MODERATE (*)    Bilirubin Urine SMALL (*)    Protein, ur 100 (*)    Leukocytes,Ua MODERATE (*)    All other components within normal limits  LACTIC ACID, PLASMA - Abnormal; Notable for the following components:   Lactic Acid, Venous 2.1 (*)    All other components within normal limits  CBC - Abnormal; Notable for the following components:   WBC 20.8 (*)    RBC 3.26 (*)    Hemoglobin 10.4 (*)    HCT 31.9 (*)    All other components within normal limits  DIFFERENTIAL - Abnormal; Notable for the following components:   Neutro Abs 19.0 (*)    Lymphs Abs 0.3 (*)    Monocytes Absolute 1.3 (*)    Abs Immature Granulocytes 0.22 (*)    All other components within normal limits  URINALYSIS, MICROSCOPIC (REFLEX) - Abnormal; Notable for the following components:   Bacteria, UA MANY (*)    All other components within normal limits  APTT - Abnormal; Notable for the following components:   aPTT 39 (*)    All other components within normal limits  I-STAT CHEM 8, ED - Abnormal; Notable for the following components:   BUN 41 (*)    Creatinine, Ser 2.00 (*)    Glucose, Bld 194 (*)    Hemoglobin 10.9 (*)    HCT 32.0 (*)    All other components within normal limits  TROPONIN I (HIGH  SENSITIVITY) - Abnormal; Notable for the following components:   Troponin I (High Sensitivity) 28 (*)    All other components within normal limits  TROPONIN I (HIGH SENSITIVITY) - Abnormal; Notable for the following components:   Troponin I (High Sensitivity) 21 (*)    All other components within normal limits   Otherwise labs showing:    Recent Labs  Lab 04/02/20 1042 04/02/20 1245 04/02/20 1308  NA 133* 135  --   K 4.5 4.1  --   CO2 22  --   --   GLUCOSE 242* 194*  --   BUN 43* 41*  --   CREATININE 1.88* 2.00*  --   CALCIUM 9.7  --   --  MG  --   --  1.7    Cr  Up from baseline see below Lab Results  Component Value Date   CREATININE 2.00 (H) 04/02/2020   CREATININE 1.88 (H) 04/02/2020   CREATININE 1.61 (H) 03/12/2020    Recent Labs  Lab 04/02/20 1042  AST 47*  ALT 121*  ALKPHOS 270*  BILITOT 3.4*  PROT 7.8  ALBUMIN 3.5   Lab Results  Component Value Date   CALCIUM 9.7 04/02/2020        WBC      Component Value Date/Time   WBC 20.8 (H) 04/02/2020 1345   WBC 22.2 (H) 04/02/2020 1042   LYMPHSABS 0.3 (L) 04/02/2020 1345   MONOABS 1.3 (H) 04/02/2020 1345   EOSABS 0.0 04/02/2020 1345   BASOSABS 0.0 04/02/2020 1345    Plt: Lab Results  Component Value Date   PLT 159 04/02/2020   Lactic Acid, Venous    Component Value Date/Time   LATICACIDVEN 1.4 04/02/2020 1515    Procalcitonin Ordered   HG/HCT   Stable,     Component Value Date/Time   HGB 10.4 (L) 04/02/2020 1345   HGB 11.0 (L) 04/02/2020 1042   HCT 31.9 (L) 04/02/2020 1345   MCV 97.9 04/02/2020 1345    Recent Labs  Lab 04/02/20 1145  LIPASE 44  AMYLASE 57   No results for input(s): AMMONIA in the last 168 hours.   Troponin  28-21     ECG: Ordered Personally reviewed by me showing: HR : 116 Rhythm:   A.fib/flutter.     no evidence of ischemic changes QTC 465   BNP (last 3 results) Recent Labs    04/02/20 2205  BNP 321.5*      UA  evidence of UTI       Urine  analysis:    Component Value Date/Time   COLORURINE YELLOW 04/02/2020 1308   APPEARANCEUR CLOUDY (A) 04/02/2020 1308   LABSPEC 1.015 04/02/2020 1308   PHURINE 7.5 04/02/2020 1308   GLUCOSEU NEGATIVE 04/02/2020 1308   HGBUR MODERATE (A) 04/02/2020 1308   BILIRUBINUR SMALL (A) 04/02/2020 1308   KETONESUR NEGATIVE 04/02/2020 1308   PROTEINUR 100 (A) 04/02/2020 1308   NITRITE NEGATIVE 04/02/2020 1308   LEUKOCYTESUR MODERATE (A) 04/02/2020 1308   Ordered     CXR -  NON acute    ED Triage Vitals  Enc Vitals Group     BP 04/02/20 1302 (!) 96/57     Pulse Rate 04/02/20 1302 (!) 118     Resp 04/02/20 1302 18     Temp 04/02/20 1358 98.8 F (37.1 C)     Temp Source 04/02/20 1358 Oral     SpO2 04/02/20 1302 95 %     Weight --      Height --      Head Circumference --      Peak Flow --      Pain Score 04/02/20 1220 0     Pain Loc --      Pain Edu? --      Excl. in [place]? --   TMAX(24)@       Latest  Blood pressure 117/62, pulse 98, temperature 98.8 F (37.1 C), temperature source Oral, resp. rate (!) 31, SpO2 94 %.     Review of Systems:    Pertinent positives include:  Fatigue, palpitations lightheadedness nausea, Constitutional:  No weight loss, night sweats, Fevers, chills, weight loss  HEENT:  No headaches, Difficulty swallowing,Tooth/dental problems,Sore throat,  No  sneezing, itching, ear ache, nasal congestion, post nasal drip,  Cardio-vascular:  No chest pain, Orthopnea, PND, anasarca,  .no Bilateral lower extremity swelling  GI:  No heartburn, indigestion, abdominal pain,  vomiting, diarrhea, change in bowel habits, loss of appetite, melena, blood in stool, hematemesis Resp:  no shortness of breath at rest. No dyspnea on exertion, No excess mucus, no productive cough, No non-productive cough, No coughing up of blood.No change in color of mucus.No wheezing. Skin:  no rash or lesions. No jaundice GU:  no dysuria, change in color of urine, no urgency or frequency. No  straining to urinate.  No flank pain.  Musculoskeletal:  No joint pain or no joint swelling. No decreased range of motion. No back pain.  Psych:  No change in mood or affect. No depression or anxiety. No memory loss.  Neuro: no localizing neurological complaints, no tingling, no weakness, no double vision, no gait abnormality, no slurred speech, no confusion  All systems reviewed and apart from [place] all are negative  Past Medical History:   Past Medical History:  Diagnosis Date  . Anemia   . Cancer [facility])    bladder cancer  . Cardiomyopathy ([place])   . CKD (chronic kidney disease)   . Goals of care, counseling/discussion 12/10/2019  . Primary pancreatic cancer ([place]) 12/10/2019  . Thyroid disease    hypothyroid     Past Surgical History:  Procedure Laterality Date  . BILIARY STENT PLACEMENT N/A 09/05/2019   Procedure: BILIARY STENT PLACEMENT;  Surgeon: Irene Shipper, MD;  Location: [facility] ENDOSCOPY;  Service: Endoscopy;  Laterality: N/A;  . BIOPSY  12/03/2019   Procedure: BIOPSY;  Surgeon: Rush Landmark Telford Nab., MD;  Location: [facility] ENDOSCOPY;  Service: Gastroenterology;;  . ERCP N/A 09/05/2019   Procedure: ENDOSCOPIC RETROGRADE CHOLANGIOPANCREATOGRAPHY (ERCP);  Surgeon: Irene Shipper, MD;  Location: [facility];  Service: Endoscopy;  Laterality: N/A;  . ESOPHAGOGASTRODUODENOSCOPY (EGD) WITH PROPOFOL N/A 12/03/2019   Procedure: ESOPHAGOGASTRODUODENOSCOPY (EGD) WITH PROPOFOL;  Surgeon: Rush Landmark Telford Nab., MD;  Location: [facility];  Service: Gastroenterology;  Laterality: N/A;  . EUS N/A 12/03/2019   Procedure: UPPER ENDOSCOPIC ULTRASOUND (EUS) LINEAR;  Surgeon: Irving Copas., MD;  Location: [facility];  Service: Gastroenterology;  Laterality: N/A;  . FINE NEEDLE ASPIRATION N/A 12/03/2019   Procedure: FINE NEEDLE ASPIRATION (FNA) LINEAR;  Surgeon: Irving Copas., MD;  Location: [facility];  Service: Gastroenterology;  Laterality: N/A;  . SPHINCTEROTOMY  09/05/2019    Procedure: SPHINCTEROTOMY;  Surgeon: Irene Shipper, MD;  Location: [facility];  Service: Endoscopy;;    Social History:  Ambulatory   independently       reports that she has never smoked. She has never used smokeless tobacco. She reports previous alcohol use. She reports previous drug use.   Family History:   Family History  Problem Relation Age of Onset  . Hypertension Other     Allergies: Allergies  Allergen Reactions  . Gadolinium Derivatives Other (See Comments)    Secondary to her stage IV kidney disease   . Amoxicillin Other (See Comments)    UNKNOWN REACTION     . Pollen Extract Other (See Comments)    Sneezing     Prior to Admission medications   Medication Sig Start Date End Date Taking? Authorizing Provider  calcium carbonate (OS-CAL) 1250 (500 Ca) MG chewable tablet Chew 1,250 mg by mouth in the morning, at noon, and at bedtime. 03/31/20   [provider]  cholecalciferol (VITAMIN D3) 25 MCG (  1000 UNIT) tablet Take 1,000 Units by mouth 2 (two) times daily.    [provider]  ferrous sulfate 325 (65 FE) MG tablet Take 1 tablet (325 mg total) by mouth daily with breakfast. 09/07/19   Regalado, Belkys A, MD  levETIRAcetam (KEPPRA) 500 MG tablet Take 500 mg by mouth 2 (two) times daily. 05/13/19   [provider]  levothyroxine (SYNTHROID) 100 MCG tablet Take 100 mcg by mouth daily. 05/13/19   [provider]  Melatonin 10 MG CAPS Take 10 mg by mouth at bedtime.    [provider]  metoprolol tartrate (LOPRESSOR) 25 MG tablet Take 12.5 mg by mouth 2 (two) times daily.  02/23/14   [provider]  sodium bicarbonate 650 MG tablet Take 650 mg by mouth 2 (two) times daily. 05/27/19   [provider]  vitamin B-12 1000 MCG tablet Take 1 tablet (1,000 mcg total) by mouth daily. 09/06/19   Elmarie Shiley, MD   Physical Exam: Vitals with BMI 04/02/2020 04/02/2020 04/02/2020  Height - - -  Weight - - -   BMI - - -  Systolic 161 096 045  Diastolic 62 54 57  Pulse 98 92 100     1. General:  in No  Acute distress   Chronically ill  -appearing 2. Psychological: Alert and  Oriented 3. Head/ENT:    Dry Mucous Membranes                          Head Non traumatic, neck supple                           Poor Dentition 4. SKIN:   decreased Skin turgor,  Skin clean Dry and intact no rash 5. Heart: Regular rate and rhythm mild  Murmur, no Rub or gallop 6. Lungs:  Clear to auscultation bilaterally, no wheezes or crackles   7. Abdomen: Soft, non-tender,   Distended bowel sounds present 8. Lower extremities: no clubbing, cyanosis, no   edema 9. Neurologically Grossly intact, moving all 4 extremities equally   10. MSK: Normal range of motion   All other LABS:     Recent Labs  Lab 04/02/20 1042 04/02/20 1245 04/02/20 1345  WBC 22.2*  --  20.8*  NEUTROABS 20.1*  --  19.0*  HGB 11.0* 10.9* 10.4*  HCT 33.6* 32.0* 31.9*  MCV 97.7  --  97.9  PLT 172  --  159     Recent Labs  Lab 04/02/20 1042 04/02/20 1245 04/02/20 1308  NA 133* 135  --   K 4.5 4.1  --   CL 99 99  --   CO2 22  --   --   GLUCOSE 242* 194*  --   BUN 43* 41*  --   CREATININE 1.88* 2.00*  --   CALCIUM 9.7  --   --   MG  --   --  1.7     Recent Labs  Lab 04/02/20 1042  AST 47*  ALT 121*  ALKPHOS 270*  BILITOT 3.4*  PROT 7.8  ALBUMIN 3.5       Cultures:    Component Value Date/Time   SDES  09/04/2019 0826    URINE, CATHETERIZED Performed at [facility], [place] [address] [place],  [address]    SPECREQUEST  09/04/2019 1914    NONE Performed at [facility], [place]  [address] [place], [place] [address]    CULT  09/04/2019 9562    NO GROWTH Performed at [place] [address], [place], [place] [address]    REPTSTATUS 09/05/2019 FINAL 09/04/2019 0826     Radiological Exams on Admission: DG Chest Portable 1 View  Result Date:  04/02/2020 CLINICAL DATA:  Atrial fibrillation. EXAM: PORTABLE CHEST 1 VIEW COMPARISON:  September 03, 2019. FINDINGS: The heart size and mediastinal contours are within normal limits. Both lungs are clear. No pneumothorax or pleural effusion is noted. The visualized skeletal structures are unremarkable. IMPRESSION: No active disease. Electronically Signed   By: Marijo Conception M.D.   On: 04/02/2020 13:00    Chart has been reviewed  Assessment/Plan   86 y.o. female with medical history significant of  Pancreatic Ca seizure episode, HTN, hypothyroidism, sp cystectomy now self catheterizes 3-4 times a day.      Admitted for new onset atrial fibrillation and sepsis Present on Admission: . New onset a-fib ([place]) -  initially on Cardizem drip appears to have converted to sinus Will order ECG to confirm        CHA2D-VASC score >3       started on  Heparin,        Check TSH      Cycle cardiac enzymes mild ly elevated but stable       Obtain ECHO      Cardiology consult in AM pending echo results Will need long term follow up Resume metoprolol A.fib w RVR in the setting of sepsis  . Primary pancreatic cancer (HCC)-followed by oncology as an outpatient resume care after discharge  . Malignant neoplasm of bladder, unspecified ([place]) -patient status post operative repair, continues to self cath four times a day.  . Hypothyroidism - - Check TSH continue home medications at current dose  . Chronic kidney disease (CKD) stage G4/A1, severely decreased glomerular filtration rate (GFR) between 15-29 mL/min/1.73 square meter and albuminuria creatinine ratio less than 30 mg/g (HCC) -chronic stable continue to monitor, avoid nephrotoxic medications and hypotension   . Anemia in chronic renal disease -obtain anemia panel and follow  . Acute lower UTI -  - treat with Rocephin          await results of urine culture and adjust antibiotic coverage as needed  . Sepsis ([place]) -   -SIRS criteria met with  elevated white blood cell count,       Component Value Date/Time   WBC 20.8 (H) 04/02/2020 1345   WBC 22.2 (H) 04/02/2020 1042   LYMPHSABS 0.3 (L) 04/02/2020 1345     tachycardia   ,   RR >20 Today's Vitals   04/02/20 1600 04/02/20 1730 04/02/20 1800 04/02/20 2012  BP: (!) 108/57 (!) 119/54 117/62 139/62  Pulse: 100 92 98 98  Resp: 18 (!) 29 (!) 31 20  Temp:    98.2 F (36.8 C)  TempSrc:    Oral  SpO2: 95% 95% 94% 97%  Weight:    44.4 kg  Height:    5' 8.4" (1.737 m)  PainSc:    0-No pain   Body mass index is 14.71 kg/m.  This patient meets SIRS Criteria and may be septic.    -Most likely source being:  urinary,     Patient meeting criteria for Severe sepsis with    elevated lactic acid >2      - Obtain serial lactic acid and procalcitonin level.  - Initiated IV antibiotics   -  await results of blood and urine culture  - Rehydrate aggressively         12:11 AM  . AKI (acute kidney injury) ([place]) -acute on chronic renal insufficiency gently rehydrate and follow  . Elevated troponin -mild stable  -no chest pain no EKG changes in the setting of sepsis  and   chronic kidney disease likely due to demand ischemia and poor clearance, monitor on telemetry and cycle cardiac enzymes to trend.  if continues to rise will need further work-up   Other plan as per orders.  DVT prophylaxis: Heparin     Code Status:    Code Status: Prior  DNR/DNI  as per patient   I had personally discussed CODE STATUS with patient    Family Communication:   Family not at  Bedside    Disposition Plan:      To home once workup is complete and patient is stable    Following barriers for discharge:                            Electrolytes corrected                               transition to PO antibiotics                             Will need to be able to tolerate PO                            Will likely need home health,      Would benefit from PT/OT eval prior to DC  Ordered                                        Consults called: none, please consult Cardiology in AM  Admission status:  ED Disposition    ED Disposition Condition [place]: [place] [[id number]]  Level of Care: Telemetry [5]  Admit to tele based on following criteria: Monitor QTC interval  Admit to tele based on following criteria: Monitor for Ischemic changes  May admit patient to [facility] or [facility] if equivalent level of care is available:: Yes  Covid Evaluation: Asymptomatic Screening Protocol (No Symptoms)  Diagnosis: New onset a-fib [facility]) [[id number]]  Admitting Physician: [place], [place] [[id number]]  Attending Physician: Raiford Noble LATIF [[id number]]  Estimated length of stay: past midnight tomorrow  Certification:: I certify this patient will need inpatient services for at least 2 midnights       inpatient     I Expect 2 midnight stay secondary to severity of patient's current illness need for inpatient interventions justified by the following:  hemodynamic instability despite optimal treatment (tachycardia )  Severe lab/radiological/exam abnormalities including:    Sepsis, uti and extensive comorbidities including:   malignancy, .    That are currently affecting medical management.   I expect  patient to be hospitalized for 2 midnights requiring inpatient medical care.  Patient is at high risk for adverse outcome (such as loss of life or disability) if not treated.  Indication for inpatient stay as follows:   Need for IV antibiotics, IV fluids,  IV  anticoagulation    Level of care   tele  For  24H    Lab Results  Component Value Date   [place] NEGATIVE 04/02/2020     Precautions: admitted as  Covid Negative     PPE: Used by the provider:   P100  eye Goggles,  Gloves    Hatcher Froning 04/03/2020, 12:16 AM    [facility]     after 2 AM please page floor coverage PA If 7AM-7PM, please contact the day team  taking care of the patient using [email or web address]   Patient was evaluated in the context of the global COVID-19 pandemic, which necessitated consideration that the patient might be at risk for infection with the SARS-CoV-2 virus that causes COVID-19. Institutional protocols and algorithms that pertain to the evaluation of patients at risk for COVID-19 are in a state of rapid change based on information released by regulatory bodies including the CDC and federal and state organizations. These policies and algorithms were followed during the patient's care.

## 2020-04-02 NOTE — Progress Notes (Signed)
ANTICOAGULATION CONSULT NOTE - Initial Consult  Pharmacy Consult for heparin Indication: atrial fibrillation  Allergies  Allergen Reactions  . Gadolinium Derivatives Other (See Comments)    Secondary to her stage IV kidney disease   . Amoxicillin Other (See Comments)    UNKNOWN REACTION     . Pollen Extract Other (See Comments)    Sneezing    Patient Measurements:   Heparin Dosing Weight: 44 kg  Vital Signs: Temp: 98.8 F (37.1 C) (10/22 1358) Temp Source: Oral (10/22 1358) BP: 109/64 (10/22 1451) Pulse Rate: 97 (10/22 1451)  Labs: Recent Labs    04/02/20 1042 04/02/20 1245 04/02/20 1308 04/02/20 1345  HGB 11.0* 10.9*  --  10.4*  HCT 33.6* 32.0*  --  31.9*  PLT 172  --   --  159  LABPROT  --   --  14.9  --   INR  --   --  1.2  --   CREATININE 1.88* 2.00*  --   --   TROPONINIHS  --   --  28*  --     Estimated Creatinine Clearance: 13.4 mL/min (A) (by C-G formula based on SCr of 2 mg/dL (H)).   Medical History: Past Medical History:  Diagnosis Date  . Anemia   . Cancer [facility])    bladder cancer  . Cardiomyopathy ([place])   . CKD (chronic kidney disease)   . Goals of care, counseling/discussion 12/10/2019  . Primary pancreatic cancer ([place]) 12/10/2019  . Thyroid disease    hypothyroid    Medications:  Infusions:  . aztreonam 1 g (04/02/20 1438)  . diltiazem (CARDIZEM) infusion 7.5 mg/hr (04/02/20 1439)  . heparin    . sodium chloride      Assessment: Patient with new onset A fib; PMH includes hx of pancreatic CA.  Goal of Therapy:  Heparin level 0.3-0.7 units/ml Monitor platelets by anticoagulation protocol: Yes   Plan:  Give 1400 units bolus x 1 Start heparin infusion at 600 units/hr Check anti-Xa level in 8 hours and daily while on heparin Continue to monitor H&H and platelets  Mallie Mussel A Tiani Stanbery 04/02/2020,3:02 PM

## 2020-04-02 NOTE — ED Notes (Signed)
Called to give report  RN unable to come to phone,  Left call back number for [facility]

## 2020-04-02 NOTE — ED Provider Notes (Signed)
[place] EMERGENCY DEPARTMENT Provider Note   CSN: [id number] Arrival date & time: 04/02/20  1220     History Chief Complaint  Patient presents with  . A-fib    Glenda King is a 86 y.o. female.  The history is provided by the patient and medical records.   Glenda King is a 86 y.o. female who presents to the Emergency Department complaining of atrial fibrillation. She is referred to the emergency department by her PCP for evaluation of new onset a fib. She has a history of pancreatic cancer status post stereotactic radiation therapy in August. Three days ago she began to feel poorly with chills, fatigue and lightheadedness. She saw her PCP today and was found to be in new onset a fib with RVR was referred to the emergency department. She denies any fevers, chest pain, abdominal pain. She does have nausea. She does perform in and out cath at home for the last 18 years. There is been no change in the color or quality of her urine. Symptoms are moderate and constant nature.    Past Medical History:  Diagnosis Date  . Anemia   . Cancer [facility])    bladder cancer  . Cardiomyopathy ([employer])   . CKD (chronic kidney disease)   . Goals of care, counseling/discussion 12/10/2019  . Primary pancreatic cancer ([place]) 12/10/2019  . Thyroid disease    hypothyroid    Patient Active Problem List   Diagnosis Date Noted  . New onset a-fib ([place]) 04/02/2020  . Genetic testing 12/16/2019  . Primary pancreatic cancer ([place]) 12/10/2019  . Goals of care, counseling/discussion 12/10/2019  . Biliary obstruction   . Jaundice 09/04/2019  . Hyperbilirubinemia 09/04/2019  . Cholelithiases 09/03/2019  . Choledocholithiasis 09/03/2019  . Chronic kidney disease (CKD) stage G4/A1, severely decreased glomerular filtration rate (GFR) between 15-29 mL/min/1.73 square meter and albuminuria creatinine ratio less than 30 mg/g (HCC) 09/03/2019  . History of seizure 09/03/2019  . Meningioma  ([place]) 05/15/2016  . Urinary retention 01/20/2016  . Left frontal lobe mass 03/24/2014  . History of bladder cancer 02/12/2014  . History of urinary diversion procedure 02/12/2014  . Hydronephrosis, bilateral 02/12/2014  . Anemia in chronic renal disease 11/17/2013  . Lack of adequate sleep 11/16/2012  . Malignant neoplasm of bladder, unspecified ([place]) 10/17/2007  . COLONIC POLYPS 10/17/2007  . Hypothyroidism 10/17/2007  . HYPERCHOLESTEROLEMIA, BORDERLINE 10/17/2007  . RENAL INSUFFICIENCY 10/17/2007    Past Surgical History:  Procedure Laterality Date  . BILIARY STENT PLACEMENT N/A 09/05/2019   Procedure: BILIARY STENT PLACEMENT;  Surgeon: Irene Shipper, MD;  Location: [facility] ENDOS[facility];  Service: Endoscopy;  Laterality: N/A;  . BIOPSY  12/03/2019   Procedure: BIOPSY;  Surgeon: Rush Landmark Telford Nab., MD;  Location: [facility] ENDOS[facility];  Service: Gastroenterology;;  . ERCP N/A 09/05/2019   Procedure: ENDOSCOPIC RETROGRADE CHOLANGIOPANCREATOGRAPHY (ERCP);  Surgeon: Irene Shipper, MD;  Location: [facility];  Service: Endoscopy;  Laterality: N/A;  . ESOPHAGOGASTRODUODENOSCOPY (EGD) WITH PROPOFOL N/A 12/03/2019   Procedure: ESOPHAGOGASTRODUODENOSCOPY (EGD) WITH PROPOFOL;  Surgeon: Rush Landmark Telford Nab., MD;  Location: [facility];  Service: Gastroenterology;  Laterality: N/A;  . EUS N/A 12/03/2019   Procedure: UPPER ENDOSCOPIC ULTRASOUND (EUS) LINEAR;  Surgeon: Irving Copas., MD;  Location: [facility];  Service: Gastroenterology;  Laterality: N/A;  . FINE NEEDLE ASPIRATION N/A 12/03/2019   Procedure: FINE NEEDLE ASPIRATION (FNA) LINEAR;  Surgeon: Irving Copas., MD;  Location: [facility];  Service: Gastroenterology;  Laterality: N/A;  . SPHINCTEROTOMY  09/05/2019   Procedure: SPHINCTEROTOMY;  Surgeon: Irene Shipper, MD;  Location: [facility];  Service: Endoscopy;;     OB History   No obstetric history on file.     History reviewed. No pertinent family history.  Social  History   Tobacco Use  . Smoking status: Never Smoker  . Smokeless tobacco: Never Used  Vaping Use  . Vaping Use: Never used  Substance Use Topics  . Alcohol use: Not Currently  . Drug use: Not Currently    Home Medications Prior to Admission medications   Medication Sig Start Date End Date Taking? Authorizing Provider  calcium carbonate (OS-CAL) 1250 (500 Ca) MG chewable tablet Chew 1,250 mg by mouth in the morning, at noon, and at bedtime. 03/31/20   [provider]  cholecalciferol (VITAMIN D3) 25 MCG (1000 UNIT) tablet Take 1,000 Units by mouth 2 (two) times daily.    [provider]  ferrous sulfate 325 (65 FE) MG tablet Take 1 tablet (325 mg total) by mouth daily with breakfast. 09/07/19   Regalado, Belkys A, MD  levETIRAcetam (KEPPRA) 500 MG tablet Take 500 mg by mouth 2 (two) times daily. 05/13/19   [provider]  levothyroxine (SYNTHROID) 100 MCG tablet Take 100 mcg by mouth daily. 05/13/19   [provider]  Melatonin 10 MG CAPS Take 10 mg by mouth at bedtime.    [provider]  metoprolol tartrate (LOPRESSOR) 25 MG tablet Take 12.5 mg by mouth 2 (two) times daily.  02/23/14   [provider]  sodium bicarbonate 650 MG tablet Take 650 mg by mouth 2 (two) times daily. 05/27/19   [provider]  vitamin B-12 1000 MCG tablet Take 1 tablet (1,000 mcg total) by mouth daily. 09/06/19   Regalado, Belkys A, MD    Allergies    Gadolinium derivatives, Amoxicillin, and Pollen extract  Review of Systems   Review of Systems  All other systems reviewed and are negative.   Physical Exam Updated Vital Signs BP 114/67   Pulse (!) 107   Temp 98.8 F (37.1 C) (Oral)   Resp (!) 25   SpO2 94%   Physical Exam Vitals and nursing note reviewed.  Constitutional:      Appearance: She is well-developed. She is ill-appearing.     Comments: Frail  HENT:     Head: Normocephalic and atraumatic.  Cardiovascular:     Rate and  Rhythm: Tachycardia present. Rhythm irregular.     Heart sounds: No murmur heard.   Pulmonary:     Effort: Pulmonary effort is normal. No respiratory distress.     Breath sounds: Normal breath sounds.  Abdominal:     Palpations: Abdomen is soft.     Tenderness: There is no guarding or rebound.     Comments: Mild generalized abdominal tenderness  Musculoskeletal:        General: No swelling or tenderness.  Skin:    General: Skin is warm and dry.  Neurological:     Mental Status: She is alert and oriented to person, place, and time.  Psychiatric:        Behavior: Behavior normal.     ED Results / Procedures / Treatments   Labs (all labs ordered are listed, but only abnormal results are displayed) Labs Reviewed  URINALYSIS, ROUTINE W REFLEX MICROSCOPIC - Abnormal; Notable for the following components:      Result Value   APPearance CLOUDY (*)    Hgb urine dipstick MODERATE (*)  Bilirubin Urine SMALL (*)    Protein, ur 100 (*)    Leukocytes,Ua MODERATE (*)    All other components within normal limits  LACTIC ACID, PLASMA - Abnormal; Notable for the following components:   Lactic Acid, Venous 2.1 (*)    All other components within normal limits  CBC - Abnormal; Notable for the following components:   WBC 20.8 (*)    RBC 3.26 (*)    Hemoglobin 10.4 (*)    HCT 31.9 (*)    All other components within normal limits  DIFFERENTIAL - Abnormal; Notable for the following components:   Neutro Abs 19.0 (*)    Lymphs Abs 0.3 (*)    Monocytes Absolute 1.3 (*)    Abs Immature Granulocytes 0.22 (*)    All other components within normal limits  URINALYSIS, MICROSCOPIC (REFLEX) - Abnormal; Notable for the following components:   Bacteria, UA MANY (*)    All other components within normal limits  APTT - Abnormal; Notable for the following components:   aPTT 39 (*)    All other components within normal limits  I-STAT CHEM 8, ED - Abnormal; Notable for the following components:   BUN  41 (*)    Creatinine, Ser 2.00 (*)    Glucose, Bld 194 (*)    Hemoglobin 10.9 (*)    HCT 32.0 (*)    All other components within normal limits  TROPONIN I (HIGH SENSITIVITY) - Abnormal; Notable for the following components:   Troponin I (High Sensitivity) 28 (*)    All other components within normal limits  RESP PANEL BY RT PCR (RSV, FLU A&B, COVID)  URINE CULTURE  CULTURE, BLOOD (SINGLE)  MAGNESIUM  PROTIME-INR  LACTIC ACID, PLASMA  CBC WITH DIFFERENTIAL/PLATELET  TROPONIN I (HIGH SENSITIVITY)    EKG None  Radiology DG Chest Portable 1 View  Result Date: 04/02/2020 CLINICAL DATA:  Atrial fibrillation. EXAM: PORTABLE CHEST 1 VIEW COMPARISON:  September 03, 2019. FINDINGS: The heart size and mediastinal contours are within normal limits. Both lungs are clear. No pneumothorax or pleural effusion is noted. The visualized skeletal structures are unremarkable. IMPRESSION: No active disease. Electronically Signed   By: Marijo Conception M.D.   On: 04/02/2020 13:00    Procedures Procedures (including critical care time) CRITICAL CARE Performed by: Quintella Reichert   Total critical care time: 35 minutes  Critical care time was exclusive of separately billable procedures and treating other patients.  Critical care was necessary to treat or prevent imminent or life-threatening deterioration.  Critical care was time spent personally by me on the following activities: development of treatment plan with patient and/or surrogate as well as nursing, discussions with consultants, evaluation of patient's response to treatment, examination of patient, obtaining history from patient or surrogate, ordering and performing treatments and interventions, ordering and review of laboratory studies, ordering and review of radiographic studies, pulse oximetry and re-evaluation of patient's condition.  Medications Ordered in ED Medications  diltiazem (CARDIZEM) 125 mg in dextrose 5% 125 mL (1 mg/mL) infusion  (7.5 mg/hr Intravenous Rate/Dose Change 04/02/20 1439)  heparin ADULT infusion 100 units/mL (25000 units/277mL sodium chloride 0.45%) (600 Units/hr Intravenous New Bag/Given 04/02/20 1531)  sodium chloride 0.9 % bolus 500 mL (0 mLs Intravenous Stopped 04/02/20 1514)  aztreonam (AZACTAM) 1 g in sodium chloride 0.9 % 100 mL IVPB (0 g Intravenous Stopped 04/02/20 1514)  sodium chloride 0.9 % bolus 500 mL (500 mLs Intravenous New Bag/Given 04/02/20 1535)  heparin bolus via infusion 1,400  Units (1,400 Units Intravenous Bolus from Bag 04/02/20 1532)    ED Course  I have reviewed the triage vital signs and the nursing notes.  Pertinent labs & imaging results that were available during my care of the patient were reviewed by me and considered in my medical decision making (see chart for details).    MDM Rules/Calculators/A&P                         Patient here for evaluation of new onset a fib with RVR. She has experienced chills at home. On evaluation she is chronically ill appearing, appears dehydrated. She was treated with IV fluids. CBC with leukocytosis. UA is concerning for UTI and she was treated with antibiotics. BMP with acute on chronic kidney injury. She was also treated with Cardizem for rate control. Lactic acid is mildly elevated, feel this is more likely secondary to dehydration over sepsis. Discussed with patient findings of studies recommendation for mission and she is in agreement with treatment plan. Hospitalist consulted for admission.   Final Clinical Impression(s) / ED Diagnoses Final diagnoses:  Atrial fibrillation with rapid ventricular response ([place])  Acute UTI    Rx / DC Orders ED Discharge Orders    None       Quintella Reichert, MD 04/02/20 1600

## 2020-04-03 ENCOUNTER — Inpatient Hospital Stay (HOSPITAL_COMMUNITY): Payer: Medicare Other

## 2020-04-03 DIAGNOSIS — N390 Urinary tract infection, site not specified: Secondary | ICD-10-CM | POA: Diagnosis not present

## 2020-04-03 DIAGNOSIS — I4891 Unspecified atrial fibrillation: Secondary | ICD-10-CM

## 2020-04-03 DIAGNOSIS — N184 Chronic kidney disease, stage 4 (severe): Secondary | ICD-10-CM | POA: Diagnosis not present

## 2020-04-03 DIAGNOSIS — N189 Chronic kidney disease, unspecified: Secondary | ICD-10-CM | POA: Diagnosis not present

## 2020-04-03 LAB — IRON AND TIBC
Iron: 15 ug/dL — ABNORMAL LOW (ref 28–170)
Saturation Ratios: 7 % — ABNORMAL LOW (ref 10.4–31.8)
TIBC: 221 ug/dL — ABNORMAL LOW (ref 250–450)
UIBC: 206 ug/dL

## 2020-04-03 LAB — CBC WITH DIFFERENTIAL/PLATELET
Abs Immature Granulocytes: 0.33 10*3/uL — ABNORMAL HIGH (ref 0.00–0.07)
Basophils Absolute: 0 10*3/uL (ref 0.0–0.1)
Basophils Relative: 0 %
Eosinophils Absolute: 0 10*3/uL (ref 0.0–0.5)
Eosinophils Relative: 0 %
HCT: 27 % — ABNORMAL LOW (ref 36.0–46.0)
Hemoglobin: 9 g/dL — ABNORMAL LOW (ref 12.0–15.0)
Immature Granulocytes: 2 %
Lymphocytes Relative: 2 %
Lymphs Abs: 0.4 10*3/uL — ABNORMAL LOW (ref 0.7–4.0)
MCH: 32.4 pg (ref 26.0–34.0)
MCHC: 33.3 g/dL (ref 30.0–36.0)
MCV: 97.1 fL (ref 80.0–100.0)
Monocytes Absolute: 1 10*3/uL (ref 0.1–1.0)
Monocytes Relative: 5 %
Neutro Abs: 15.9 10*3/uL — ABNORMAL HIGH (ref 1.7–7.7)
Neutrophils Relative %: 91 %
Platelets: 132 10*3/uL — ABNORMAL LOW (ref 150–400)
RBC: 2.78 MIL/uL — ABNORMAL LOW (ref 3.87–5.11)
RDW: 12.5 % (ref 11.5–15.5)
WBC: 17.5 10*3/uL — ABNORMAL HIGH (ref 4.0–10.5)
nRBC: 0 % (ref 0.0–0.2)

## 2020-04-03 LAB — URINE CULTURE

## 2020-04-03 LAB — HEPARIN LEVEL (UNFRACTIONATED)
Heparin Unfractionated: <0.1 IU/mL — ABNORMAL LOW (ref 0.30–0.70)
Heparin Unfractionated: 0.11 IU/mL — ABNORMAL LOW (ref 0.30–0.70)

## 2020-04-03 LAB — FERRITIN: Ferritin: 196 ng/mL (ref 11–307)

## 2020-04-03 LAB — GLUCOSE, CAPILLARY
Glucose-Capillary: 136 mg/dL — ABNORMAL HIGH (ref 70–99)
Glucose-Capillary: 132 mg/dL — ABNORMAL HIGH (ref 70–99)

## 2020-04-03 LAB — COMPREHENSIVE METABOLIC PANEL
ALT: 86 U/L — ABNORMAL HIGH (ref 0–44)
AST: 32 U/L (ref 15–41)
Albumin: 2.7 g/dL — ABNORMAL LOW (ref 3.5–5.0)
Alkaline Phosphatase: 206 U/L — ABNORMAL HIGH (ref 38–126)
Anion gap: 12 (ref 5–15)
BUN: 41 mg/dL — ABNORMAL HIGH (ref 8–23)
CO2: 20 mmol/L — ABNORMAL LOW (ref 22–32)
Calcium: 8.2 mg/dL — ABNORMAL LOW (ref 8.9–10.3)
Chloride: 105 mmol/L (ref 98–111)
Creatinine, Ser: 1.62 mg/dL — ABNORMAL HIGH (ref 0.44–1.00)
GFR, Estimated: 30 mL/min — ABNORMAL LOW (ref >60)
Glucose, Bld: 116 mg/dL — ABNORMAL HIGH (ref 70–99)
Potassium: 3.8 mmol/L (ref 3.5–5.1)
Sodium: 137 mmol/L (ref 135–145)
Total Bilirubin: 2.7 mg/dL — ABNORMAL HIGH (ref 0.3–1.2)
Total Protein: 6.3 g/dL — ABNORMAL LOW (ref 6.5–8.1)

## 2020-04-03 LAB — VITAMIN B12: Vitamin B-12: 2067 pg/mL — ABNORMAL HIGH (ref 180–914)

## 2020-04-03 LAB — FOLATE: Folate: 16.8 ng/mL (ref >5.9)

## 2020-04-03 LAB — ECHOCARDIOGRAM COMPLETE
Area-P 1/2: 3.6 cm2
Height: 68.4 in
S' Lateral: 2.1 cm
Weight: 1566.15 oz

## 2020-04-03 LAB — RETICULOCYTES
Immature Retic Fract: 12.5 % (ref 2.3–15.9)
RBC.: 2.69 MIL/uL — ABNORMAL LOW (ref 3.87–5.11)
Retic Count, Absolute: 28.8 10*3/uL (ref 19.0–186.0)
Retic Ct Pct: 1.1 % (ref 0.4–3.1)

## 2020-04-03 LAB — TSH: TSH: 0.839 u[IU]/mL (ref 0.350–4.500)

## 2020-04-03 LAB — CANCER ANTIGEN 19-9: CA 19-9: 170 U/mL — ABNORMAL HIGH (ref 0–35)

## 2020-04-03 LAB — MAGNESIUM: Magnesium: 1.8 mg/dL (ref 1.7–2.4)

## 2020-04-03 LAB — PHOSPHORUS: Phosphorus: 3.3 mg/dL (ref 2.5–4.6)

## 2020-04-03 MED ORDER — POTASSIUM CHLORIDE CRYS ER 20 MEQ PO TBCR
40.0000 meq | EXTENDED_RELEASE_TABLET | Freq: Once | ORAL | Status: AC
  Administered 2020-04-03: 40 meq via ORAL
  Filled 2020-04-03: qty 2

## 2020-04-03 MED ORDER — MAGNESIUM SULFATE 2 GM/50ML IV SOLN
2.0000 g | Freq: Once | INTRAVENOUS | Status: AC
  Administered 2020-04-03: 2 g via INTRAVENOUS
  Filled 2020-04-03: qty 50

## 2020-04-03 MED ORDER — HEPARIN BOLUS VIA INFUSION
1000.0000 [IU] | Freq: Once | INTRAVENOUS | Status: AC
  Administered 2020-04-03: 1000 [IU] via INTRAVENOUS
  Filled 2020-04-03: qty 1000

## 2020-04-03 MED ORDER — METOPROLOL TARTRATE 5 MG/5ML IV SOLN: 5.0000 mg | INTRAVENOUS | Status: DC | PRN

## 2020-04-03 MED ORDER — APIXABAN 2.5 MG PO TABS
2.5000 mg | ORAL_TABLET | Freq: Two times a day (BID) | ORAL | Status: DC
  Administered 2020-04-03 – 2020-04-04 (×3): 2.5 mg via ORAL
  Filled 2020-04-03 (×4): qty 1

## 2020-04-03 MED ORDER — METOPROLOL TARTRATE 25 MG PO TABS: 25.0000 mg | ORAL_TABLET | Freq: Two times a day (BID) | ORAL | Status: DC

## 2020-04-03 MED ORDER — HEPARIN (PORCINE) 25000 UT/250ML-% IV SOLN
900.0000 [IU]/h | INTRAVENOUS | Status: DC
  Administered 2020-04-03: 900 [IU]/h via INTRAVENOUS

## 2020-04-03 MED ORDER — METOPROLOL TARTRATE 25 MG PO TABS
25.0000 mg | ORAL_TABLET | Freq: Two times a day (BID) | ORAL | Status: DC
  Administered 2020-04-03 – 2020-04-13 (×18): 25 mg via ORAL
  Filled 2020-04-03 (×18): qty 1

## 2020-04-03 MED ORDER — HEPARIN BOLUS VIA INFUSION
1400.0000 [IU] | Freq: Once | INTRAVENOUS | Status: AC
  Administered 2020-04-03: 1400 [IU] via INTRAVENOUS
  Filled 2020-04-03: qty 1400

## 2020-04-03 NOTE — Discharge Instructions (Signed)

## 2020-04-03 NOTE — Progress Notes (Signed)
PROGRESS NOTE    Glenda King  LFY:[id number] DOB: 1928-11-15 DOA: 04/02/2020 PCP: Javier Glazier, MD    Brief Narrative:  Glenda King was admitted to the hospital with the working diagnosis of new onset atrial fibrillation.   86 year old female with past medical history for pancreatic cancer, seizures, hypertension and hypothyroidism.  Reported 3 days of fatigue, generalized weakness, lightheadedness and dizziness.  Intermittent palpitations.  On the day of admission she was seen as a follow-up at her oncologist office, she was noted to be tachycardic, EKG showed atrial fibrillation with heart rate of 120s to 130s.  She was referred to the hospital for further evaluation.  On her initial physical examination blood pressure 96/57, heart rate 118, respiratory rate 18, temperature 98.8 F, oxygen saturation 95%.  She had dry mucous membranes, lungs clear to auscultation bilaterally, heart S1-S2, no gallops or rubs, abdomen soft, no lower extremity edema.   Sodium 133, potassium 4.5, chloride 99, bicarb 22, glucose 242, BUN 43, creatinine 1.8, white count 22.2, hemoglobin 11.0, hematocrit 33.6, platelets 172.  SARS COVID-19 negative.  Urinalysis 21-50 white cells, 11-20 red cells, specific gravity 1.015 Chest radiograph with hyperinflation, increased lung markings bilaterally, no infiltrates.  Abdominal film no bowel obstruction pattern.  EKG #1  143 bpm, normal axis, right bundle branch block, atrial fibrillation rhythm, no ST segment or T wave changes. #2 116 atrial fibrillation rhythm. #3 96 bpm sinus rhythm.   Patient placed on diltiazem infusion for rate control and anticoagulation with heparin.     Assessment & Plan:   Principal Problem:   New onset a-fib [facility]) Active Problems:   Malignant neoplasm of bladder, unspecified (HCC)   Hypothyroidism   Chronic kidney disease (CKD) stage G4/A1, severely decreased glomerular filtration rate (GFR) between 15-29 mL/min/1.73  square meter and albuminuria creatinine ratio less than 30 mg/g (HCC)   History of seizure   Anemia in chronic renal disease   History of urinary diversion procedure   Primary pancreatic cancer (HCC)   Acute lower UTI   Elevated troponin   1. New onset atrial fibrillation. Patient back on atrial fibrillation, telemetry personally reviewed.   Increase metoprolol to 25 mg po bid and wean off diltiazem infusion, to heart rate less than 130 bpm.  ChadsVAsc score is 4, will transition from heparin to apixaban.   Follow up on echocardiogram, continue with telemetry monitoring.   2. Urine infection (no sepsis). Patient with dark urine, but no abdominal pain or dysuria. Culture with multiple bacteria (contamination).  Wbc is down to 17 and cultures blood with no growth.   Plan to completed short course of antibiotic therapy with ceftriaxone.   3. CKD stage 4/ hypomagenesemia/ hypokalemia. Base cr at 1.8 to 2.0. Continue close follow up on renal function and electrolytes.  K is 3,8 and cr today 1,62, Mg 1,8.   Add 40 meq Kcl and 2 g mag sulfate today. Continue with oral bicarbonate.   4. Pancreatic cancer/ localized/ chronic anemia/ iron def. Stable, follow with outpatient oncology. Patient is tolerating po well.  Old records personally reviewed, patient had radiosurgery on 01/2020. Her oncologist with Dr. Marin Olp.   Continue with iron supplementation.   5. Seizures. Continue with Keppra. No active seizures.   6. Hypothyroid. Continue with levothyroxine, TSH 0,83     Patient continue to be at high risk for worsening arrhythmia   Status is: Inpatient  Remains inpatient appropriate because:Inpatient level of care appropriate due to severity of illness  Dispo: The patient is from: ALF              Anticipated d/c is to: ALF              Anticipated d/c date is: 3 days              Patient currently is not medically stable to d/c.   DVT prophylaxis: apixaban   Code Status:      dnr   Family Communication:  Patient will communicate with her brother.      Antimicrobials:   Ceftriaxone     Subjective: Patient is feeling better, no nausea or vomiting, no chest pain or dyspnea, palpitations have improved, continue to be very weak and deconditioned.   Objective: Vitals:   04/02/20 2012 04/03/20 0230 04/03/20 0542 04/03/20 1332  BP: 139/62 (!) 144/67 (!) 119/59 (!) 93/52  Pulse: 98 (!) 106 92 94  Resp: 20 20 20 18   Temp: 98.2 F (36.8 C) 98.8 F (37.1 C) (!) 100.4 F (38 C) 98.4 F (36.9 C)  TempSrc: Oral Oral Oral Oral  SpO2: 97% 91% 92% 97%  Weight: 44.4 kg     Height: 5' 8.4" (1.737 m)       Intake/Output Summary (Last 24 hours) at 04/03/2020 1459 Last data filed at 04/03/2020 0500 Gross per 24 hour  Intake 842.18 ml  Output 350 ml  Net 492.18 ml   Filed Weights   04/02/20 2012  Weight: 44.4 kg    Examination:   General: Not in pain or dyspnea, deconditioned  Neurology: Awake and alert, non focal  E ENT: mild pallor, no icterus, oral mucosa moist Cardiovascular: No JVD. S1-S2 present, irregularly irregular with no gallops, rubs, or murmurs. No lower extremity edema. Pulmonary: positive breath sounds with adequate air movement, no wheezing, rhonchi or rales. Gastrointestinal. Abdomen soft and non tender Skin. No rashes Musculoskeletal: no joint deformities     Data Reviewed: I have personally reviewed following labs and imaging studies  CBC: Recent Labs  Lab 04/02/20 1042 04/02/20 1245 04/02/20 1345 04/03/20 0024  WBC 22.2*  --  20.8* 17.5*  NEUTROABS 20.1*  --  19.0* 15.9*  HGB 11.0* 10.9* 10.4* 9.0*  HCT 33.6* 32.0* 31.9* 27.0*  MCV 97.7  --  97.9 97.1  PLT 172  --  159 481*   Basic Metabolic Panel: Recent Labs  Lab 04/02/20 1042 04/02/20 1245 04/02/20 1308 04/03/20 0024  NA 133* 135  --  137  K 4.5 4.1  --  3.8  CL 99 99  --  105  CO2 22  --   --  20*  GLUCOSE 242* 194*  --  116*  BUN 43* 41*  --  41*   CREATININE 1.88* 2.00*  --  1.62*  CALCIUM 9.7  --   --  8.2*  MG  --   --  1.7 1.8  PHOS  --   --   --  3.3   GFR: Estimated Creatinine Clearance: 16.8 mL/min (A) (by C-G formula based on SCr of 1.62 mg/dL (H)). Liver Function Tests: Recent Labs  Lab 04/02/20 1042 04/03/20 0024  AST 47* 32  ALT 121* 86*  ALKPHOS 270* 206*  BILITOT 3.4* 2.7*  PROT 7.8 6.3*  ALBUMIN 3.5 2.7*   Recent Labs  Lab 04/02/20 1145  LIPASE 44  AMYLASE 57   No results for input(s): AMMONIA in the last 168 hours. Coagulation Profile: Recent Labs  Lab 04/02/20 1308  INR 1.2  Cardiac Enzymes: No results for input(s): CKTOTAL, CKMB, CKMBINDEX, TROPONINI in the last 168 hours. BNP (last 3 results) No results for input(s): PROBNP in the last 8760 hours. HbA1C: No results for input(s): HGBA1C in the last 72 hours. CBG: Recent Labs  Lab 04/03/20 1204  GLUCAP 136*   Lipid Profile: No results for input(s): CHOL, HDL, LDLCALC, TRIG, CHOLHDL, LDLDIRECT in the last 72 hours. Thyroid Function Tests: Recent Labs    04/03/20 0024  TSH 0.839   Anemia Panel: Recent Labs    04/03/20 0024  VITAMINB12 2,067*  FOLATE 16.8  FERRITIN 196  TIBC 221*  IRON 15*  RETICCTPCT 1.1      Radiology Studies: I have reviewed all of the imaging during this hospital visit personally     Scheduled Meds: . ferrous sulfate  325 mg Oral Q breakfast  . levETIRAcetam  500 mg Oral BID  . levothyroxine  100 mcg Oral Q0600  . melatonin  10 mg Oral QHS  . metoprolol tartrate  12.5 mg Oral BID  . sodium bicarbonate  650 mg Oral BID   Continuous Infusions: . cefTRIAXone (ROCEPHIN)  IV 1 g (04/02/20 2242)  . diltiazem (CARDIZEM) infusion 5 mg/hr (04/03/20 0355)  . heparin 900 Units/hr (04/03/20 1123)     LOS: 1 day        Kaivon Livesey Gerome Apley, MD

## 2020-04-03 NOTE — Progress Notes (Signed)
[place] for heparin Indication: atrial fibrillation  Allergies  Allergen Reactions  . Gadolinium Derivatives Other (See Comments)    Secondary to her stage IV kidney disease   . Amoxicillin Other (See Comments)    UNKNOWN REACTION     . Pollen Extract Other (See Comments)    Sneezing    Patient Measurements: Height: 5' 8.4" (173.7 cm) Weight: 44.4 kg (97 lb 14.2 oz) IBW/kg (Calculated) : 64.82 Heparin Dosing Weight: 44 kg  Vital Signs: Temp: 98.2 F (36.8 C) (10/22 2012) Temp Source: Oral (10/22 2012) BP: 139/62 (10/22 2012) Pulse Rate: 98 (10/22 2012)  Labs: Recent Labs    04/02/20 1042 04/02/20 1042 04/02/20 1245 04/02/20 1245 04/02/20 1308 04/02/20 1308 04/02/20 1345 04/02/20 1515 04/02/20 2058 04/02/20 2205 04/03/20 0024  HGB 11.0*  --  10.9*   < >  --   --  10.4*  --   --   --  9.0*  HCT 33.6*   < > 32.0*  --   --   --  31.9*  --   --   --  27.0*  PLT 172  --   --   --   --   --  159  --   --   --  132*  APTT  --   --   --   --  39*  --   --   --   --   --   --   LABPROT  --   --   --   --  14.9  --   --   --   --   --   --   INR  --   --   --   --  1.2  --   --   --   --   --   --   HEPARINUNFRC  --   --   --   --   --   --   --   --   --   --  <0.10*  CREATININE 1.88*  --  2.00*  --   --   --   --   --   --   --  1.62*  TROPONINIHS  --   --   --   --  28*   < >  --  21* 20* 20*  --    < > = values in this interval not displayed.    Estimated Creatinine Clearance: 16.8 mL/min (A) (by C-G formula based on SCr of 1.62 mg/dL (H)).   Medical History: Past Medical History:  Diagnosis Date  . Anemia   . Cancer [facility])    bladder cancer  . Cardiomyopathy ([place])   . CKD (chronic kidney disease)   . Goals of care, counseling/discussion 12/10/2019  . Primary pancreatic cancer ([place]) 12/10/2019  . Thyroid disease    hypothyroid    Medications:  Infusions:  . sodium chloride 75 mL/hr (04/02/20 2129)  .  cefTRIAXone (ROCEPHIN)  IV 1 g (04/02/20 2242)  . diltiazem (CARDIZEM) infusion 5 mg/hr (04/02/20 2244)  . heparin 600 Units/hr (04/02/20 2248)    Assessment: Patient with new onset A fib; PMH includes hx of pancreatic CA.  04/03/2020 HL <0.1 on 600 units/hr Hgb down to 9 (hx of chronic anemia) plts down to 132 No bleeding per d/w with RN   Goal of Therapy:  Heparin level 0.3-0.7 units/ml Monitor platelets by anticoagulation protocol: Yes   Plan:  Bolus heparin 1400 units x 1 Increase heparin drip to 750 units/hr Heparin level in 8 hours Continue to monitor H&H and platelets  Dolly Rias RPh 04/03/2020, 1:40 AM

## 2020-04-03 NOTE — Evaluation (Addendum)
Physical Therapy Evaluation Patient Details Name: Glenda King MRN: [id number] DOB: [birth year] Today's Date: 04/03/2020   History of Present Illness  86 y.o. female with medical history significant of  Pancreatic Ca seizure episode, HTN, hypothyroidism, sp cystectomy now self catheterizes 3-4 times a day. Pt admitted with fatigue, dizziness, a fall. Dx of new afib, sepsis, UTI.  Clinical Impression  Pt admitted with above diagnosis. Pt ambulated 100' with IV pole and hand held assist for balance due to mild posterior lean. Pt is independent with mobility at baseline. 8/10 RLQ pain limits activity tolerance. Instructed pt in LE strengthening exercises to be done independently to minimize deconditioning during hospitalization.  HHPT and supervision for mobility recommended due to fall risk.  Pt currently with functional limitations due to the deficits listed below (see PT Problem List). Pt will benefit from skilled PT to increase their independence and safety with mobility to allow discharge to the venue listed below.       Follow Up Recommendations Home health PT;Supervision for mobility/OOB    Equipment Recommendations  Rolling walker with 5" wheels    Recommendations for Other Services       Precautions / Restrictions Precautions Precautions: Fall Precaution Comments: pt denies falls in past 6 months Restrictions Weight Bearing Restrictions: No      Mobility  Bed Mobility Overal bed mobility: Modified Independent             General bed mobility comments: HOB up, used rail    Transfers Overall transfer level: Needs assistance Equipment used: 1 person hand held assist Transfers: Sit to/from Stand Sit to Stand: Min assist         General transfer comment: min A to rise and to steady 2* posterior lean in standing  Ambulation/Gait Ambulation/Gait assistance: Min guard;Min assist Gait Distance (Feet): 100 Feet Assistive device: IV Pole;1 person hand held  assist Gait Pattern/deviations: Step-through pattern;Decreased stride length Gait velocity: decr   General Gait Details: mild posterior lean, min guard to min A for balance  Stairs            Wheelchair Mobility    Modified Rankin (Stroke Patients Only)       Balance Overall balance assessment: Needs assistance Sitting-balance support: Feet supported Sitting balance-Leahy Scale: Fair     Standing balance support: No upper extremity supported Standing balance-Leahy Scale: Poor Standing balance comment: posterior lean requiring min A                             Pertinent Vitals/Pain Pain Assessment: 0-10 Pain Score: 8  Pain Location: RLQ with movement Pain Descriptors / Indicators: Aching;Grimacing;Guarding Pain Intervention(s): Limited activity within patient's tolerance;Monitored during session;Repositioned    Home Living Family/patient expects to be discharged to:: Private residence Living Arrangements: Alone Available Help at Discharge: Family;Available PRN/intermittently Type of Home: Other(Comment) (cottage) Home Access: Level entry     Home Layout: One level Home Equipment: Shower seat - built in      Prior Function Level of Independence: Independent         Comments: walks without AD, drives to mass daily     Hand Dominance   Dominant Hand: Right    Extremity/Trunk Assessment   Upper Extremity Assessment Upper Extremity Assessment: Defer to OT evaluation    Lower Extremity Assessment Lower Extremity Assessment: Overall WFL for tasks assessed    Cervical / Trunk Assessment Cervical / Trunk Assessment: Kyphotic  Communication  Communication: No difficulties  Cognition Arousal/Alertness: Awake/alert Behavior During Therapy: WFL for tasks assessed/performed Overall Cognitive Status: Within Functional Limits for tasks assessed                                        General Comments      Exercises      Assessment/Plan    PT Assessment Patient needs continued PT services  PT Problem List Decreased mobility;Pain;Decreased balance;Decreased knowledge of use of DME;Decreased activity tolerance       PT Treatment Interventions DME instruction;Gait training;Therapeutic activities;Therapeutic exercise;Balance training;Patient/family education    PT Goals (Current goals can be found in the Care Plan section)  Acute Rehab PT Goals Patient Stated Goal: to return home, go to mass daily PT Goal Formulation: With patient Time For Goal Achievement: 04/17/20 Potential to Achieve Goals: Good    Frequency Min 3X/week   Barriers to discharge        Co-evaluation PT/OT/SLP Co-Evaluation/Treatment: Yes Reason for Co-Treatment: For patient/therapist safety PT goals addressed during session: Mobility/safety with mobility;Balance;Proper use of DME;Strengthening/ROM         AM-PAC PT "6 Clicks" Mobility  Outcome Measure Help needed turning from your back to your side while in a flat bed without using bedrails?: None Help needed moving from lying on your back to sitting on the side of a flat bed without using bedrails?: A Little Help needed moving to and from a bed to a chair (including a wheelchair)?: A Little Help needed standing up from a chair using your arms (e.g., wheelchair or bedside chair)?: None Help needed to walk in hospital room?: A Little Help needed climbing 3-5 steps with a railing? : A Little 6 Click Score: 20    End of Session Equipment Utilized During Treatment: Gait belt Activity Tolerance: Patient tolerated treatment well Patient left: in chair;with call bell/phone within reach;with chair alarm set Nurse Communication: Mobility status PT Visit Diagnosis: Difficulty in walking, not elsewhere classified (R26.2)    Time: 7253-6644 PT Time Calculation (min) (ACUTE ONLY): 19 min   Charges:   PT Evaluation $PT Eval Low Complexity: 1 Low        Philomena Doheny PT 04/03/2020  Acute Rehabilitation Services Pager [phone] Office [phone]

## 2020-04-03 NOTE — Evaluation (Signed)
Occupational Therapy Evaluation Patient Details Name: Glenda King MRN: [id number] DOB: 11-21-1928 Today's Date: 04/03/2020    History of Present Illness 86 y.o. female with medical history significant of  Pancreatic Ca seizure episode, HTN, hypothyroidism, sp cystectomy now self catheterizes 3-4 times a day. Pt admitted with fatigue, dizziness, a fall. Dx of new afib, sepsis, UTI.   Clinical Impression   Glenda King is an 86 year old woman who presents with generalized weakness, impaired balance and decreased activity tolerance secondary to complaints of pain resulting in a decline in independence and safety. Patient currently requiring min assist for standing and ambulation secondary to posterior lean and setup to min assist for ADLs. Patient will benefit from skilled OT services while in hospital in order to improve deficits and learn compensatory strategies as needed in order to return PLOF. Resistant to the idea of staying with family or family staying with her. Would highly recommend increased supervision at home for safety.     Follow Up Recommendations  Home health OT    Equipment Recommendations  None recommended by OT    Recommendations for Other Services       Precautions / Restrictions Precautions Precautions: Fall Precaution Comments: pt denies falls in past 6 months Restrictions Weight Bearing Restrictions: No      Mobility Bed Mobility Overal bed mobility: Modified Independent             General bed mobility comments: HOB up, used rail    Transfers Overall transfer level: Needs assistance Equipment used: 1 person hand held assist Transfers: Sit to/from Stand Sit to Stand: Min assist         General transfer comment: min A to rise and to steady 2* posterior lean in standing    Balance Overall balance assessment: Needs assistance Sitting-balance support: Feet supported Sitting balance-Leahy Scale: Fair     Standing balance  support: No upper extremity supported Standing balance-Leahy Scale: Poor Standing balance comment: posterior lean requiring min A                           ADL either performed or assessed with clinical judgement   ADL Overall ADL's : Needs assistance/impaired Eating/Feeding: Independent   Grooming: Set up;Sitting   Upper Body Bathing: Set up;Sitting   Lower Body Bathing: Minimal assistance;Sit to/from stand   Upper Body Dressing : Set up;Sitting;Supervision/safety   Lower Body Dressing: Minimal assistance;Sit to/from stand;Set up Lower Body Dressing Details (indicate cue type and reason): min assist to don socks - required new position to reach feet with verbal cue from therapist Toilet Transfer: Minimal assistance;Ambulation;Grab bars;Regular Museum/gallery exhibitions officer and Hygiene: Min guard;Sit to/from stand       Functional mobility during ADLs: Minimal assistance       Vision Patient Visual Report: No change from baseline       Perception     Praxis      Pertinent Vitals/Pain Pain Assessment: 0-10 Pain Score: 8  Pain Location: RLQ with movement Pain Descriptors / Indicators: Aching;Grimacing;Guarding Pain Intervention(s): Limited activity within patient's tolerance;Monitored during session     Hand Dominance Right   Extremity/Trunk Assessment Upper Extremity Assessment Upper Extremity Assessment: Overall WFL for tasks assessed   Lower Extremity Assessment Lower Extremity Assessment: Defer to PT evaluation   Cervical / Trunk Assessment Cervical / Trunk Assessment: Kyphotic   Communication Communication Communication: No difficulties   Cognition Arousal/Alertness: Awake/alert Behavior During Therapy:  WFL for tasks assessed/performed Overall Cognitive Status: Within Functional Limits for tasks assessed                                     General Comments       Exercises     Shoulder Instructions       Home Living Family/patient expects to be discharged to:: Private residence Living Arrangements: Alone Available Help at Discharge: Family;Available PRN/intermittently Type of Home: Other(Comment) (cottage) Home Access: Level entry     Home Layout: One level     Bathroom Shower/Tub: Occupational psychologist: Standard     Home Equipment: Shower seat - built in          Prior Functioning/Environment Level of Independence: Independent        Comments: walks without AD, drives to mass daily        OT Problem List: Decreased activity tolerance;Impaired balance (sitting and/or standing);Decreased knowledge of use of DME or AE;Pain      OT Treatment/Interventions: Self-care/ADL training;Therapeutic exercise;DME and/or AE instruction;Energy conservation;Patient/family education;Therapeutic activities;Balance training    OT Goals(Current goals can be found in the care plan section) Acute Rehab OT Goals Patient Stated Goal: to return home, go to mass daily OT Goal Formulation: With patient Time For Goal Achievement: 04/17/20 Potential to Achieve Goals: Good  OT Frequency: Min 2X/week   Barriers to D/C: Decreased caregiver support  lives alone       Co-evaluation PT/OT/SLP Co-Evaluation/Treatment: Yes Reason for Co-Treatment: For patient/therapist safety;To address functional/ADL transfers PT goals addressed during session: Mobility/safety with mobility OT goals addressed during session: ADL's and self-care      AM-PAC OT "6 Clicks" Daily Activity     Outcome Measure Help from another person eating meals?: None Help from another person taking care of personal grooming?: A Little Help from another person toileting, which includes using toliet, bedpan, or urinal?: A Little Help from another person bathing (including washing, rinsing, drying)?: A Little Help from another person to put on and taking off regular upper body clothing?: A Little Help from  another person to put on and taking off regular lower body clothing?: A Little 6 Click Score: 19   End of Session Equipment Utilized During Treatment: Gait belt Nurse Communication:  (okay to see per RN, IV alarm)  Activity Tolerance: Patient limited by pain Patient left: in chair;with chair alarm set  OT Visit Diagnosis: Unsteadiness on feet (R26.81);Muscle weakness (generalized) (M62.81);Pain Pain - part of body:  (RLQ pain)                Time: 5035-4656 OT Time Calculation (min): 18 min Charges:  OT General Charges $OT Visit: 1 Visit OT Evaluation $OT Eval Low Complexity: 1 Low  Maudell Stanbrough, OTR/L [place] [phone] Pager: [place] 04/03/2020, 11:19 AM

## 2020-04-03 NOTE — Progress Notes (Addendum)
[facility] for heparin Indication: atrial fibrillation  Allergies  Allergen Reactions  . Gadolinium Derivatives Other (See Comments)    Secondary to her stage IV kidney disease   . Amoxicillin Other (See Comments)    UNKNOWN REACTION     . Pollen Extract Other (See Comments)    Sneezing    Patient Measurements: Height: 5' 8.4" (173.7 cm) Weight: 44.4 kg (97 lb 14.2 oz) IBW/kg (Calculated) : 64.82 Heparin Dosing Weight: n/a. TBW < IBW. Use TBW = 45 kg  Vital Signs: Temp: 100.4 F (38 C) (10/23 0542) Temp Source: Oral (10/23 0542) BP: 119/59 (10/23 0542) Pulse Rate: 92 (10/23 0542)  Labs: Recent Labs    04/02/20 1042 04/02/20 1042 04/02/20 1245 04/02/20 1245 04/02/20 1308 04/02/20 1308 04/02/20 1345 04/02/20 1515 04/02/20 2058 04/02/20 2205 04/03/20 0024 04/03/20 1009  HGB 11.0*  --  10.9*   < >  --   --  10.4*  --   --   --  9.0*  --   HCT 33.6*   < > 32.0*  --   --   --  31.9*  --   --   --  27.0*  --   PLT 172  --   --   --   --   --  159  --   --   --  132*  --   APTT  --   --   --   --  39*  --   --   --   --   --   --   --   LABPROT  --   --   --   --  14.9  --   --   --   --   --   --   --   INR  --   --   --   --  1.2  --   --   --   --   --   --   --   HEPARINUNFRC  --   --   --   --   --   --   --   --   --   --  <0.10* 0.11*  CREATININE 1.88*  --  2.00*  --   --   --   --   --   --   --  1.62*  --   TROPONINIHS  --   --   --   --  28*   < >  --  21* 20* 20*  --   --    < > = values in this interval not displayed.    Estimated Creatinine Clearance: 16.8 mL/min (A) (by C-G formula based on SCr of 1.62 mg/dL (H)).   Medical History: Past Medical History:  Diagnosis Date  . Anemia   . Cancer [facility])    bladder cancer  . Cardiomyopathy ([place])   . CKD (chronic kidney disease)   . Goals of care, counseling/discussion 12/10/2019  . Primary pancreatic cancer ([place]) 12/10/2019  . Thyroid disease    hypothyroid     Medications:  Infusions:  . cefTRIAXone (ROCEPHIN)  IV 1 g (04/02/20 2242)  . diltiazem (CARDIZEM) infusion 5 mg/hr (04/03/20 0355)  . heparin 750 Units/hr (04/03/20 0158)    Assessment: Pt is 42 yoF diagnosed with new onset atrial fibrillation. PMH significant for history of pancreatic cancer. Pt not prescribed anticoagulants PTA.  Baseline labs: -Hgb 10.4, Plt 159  Today, 04/03/20  CBC: Hgb (9) slightly decreased, low. Plt (132) slightly low, slightly decreased  HL = 0.11 remains subtherapeutic, but improved from undetectable after rate change. Currently infusing at 750 units/hr  Confirmed with RN that heparin infusing at correct rate. No interruptions or line issues. No signs of bleeding.   Verified TBW is correct  SCr 1.61, CrCl ~17 mL/min. Renal function improved since admission  Goal of Therapy:  Heparin level 0.3-0.7 units/ml Monitor platelets by anticoagulation protocol: Yes   Plan:   Heparin bolus 1000 units once  Increase heparin infusion to 900 units/hr  Check 8 hour HL  HL, CBC daily while on heparin infusion  Monitor for signs/symptoms of bleeding  Lenis Noon, PharmD 04/03/20 11:16 AM  ________________________________________________  Addendum: Pharmacy consulted to transition anticoagulation from IV UFH to apixaban for atrial fibrillation.   Assessment:  Age: 86; TBW 45 kg; SCr 1.62 (baseline is ~2)  Plan:  Discontinue heparin infusion at the time apixaban started  Apixaban 2.5 mg PO BID  CBC and SCr with AM labs tomorrow  Lenis Noon, PharmD 04/03/20 4:11 PM

## 2020-04-03 NOTE — Progress Notes (Signed)
Echocardiogram 2D Echocardiogram has been performed.  [place] Deputy Abhinav Mayorquin 04/03/2020, 9:46 AM

## 2020-04-04 DIAGNOSIS — N184 Chronic kidney disease, stage 4 (severe): Secondary | ICD-10-CM | POA: Diagnosis not present

## 2020-04-04 DIAGNOSIS — N189 Chronic kidney disease, unspecified: Secondary | ICD-10-CM | POA: Diagnosis not present

## 2020-04-04 DIAGNOSIS — N390 Urinary tract infection, site not specified: Secondary | ICD-10-CM | POA: Diagnosis not present

## 2020-04-04 DIAGNOSIS — I4891 Unspecified atrial fibrillation: Secondary | ICD-10-CM | POA: Diagnosis not present

## 2020-04-04 LAB — CBC WITH DIFFERENTIAL/PLATELET
Abs Immature Granulocytes: 0.12 10*3/uL — ABNORMAL HIGH (ref 0.00–0.07)
Basophils Absolute: 0 10*3/uL (ref 0.0–0.1)
Basophils Relative: 0 %
Eosinophils Absolute: 0 10*3/uL (ref 0.0–0.5)
Eosinophils Relative: 0 %
HCT: 25.6 % — ABNORMAL LOW (ref 36.0–46.0)
Hemoglobin: 8.1 g/dL — ABNORMAL LOW (ref 12.0–15.0)
Immature Granulocytes: 1 %
Lymphocytes Relative: 2 %
Lymphs Abs: 0.4 10*3/uL — ABNORMAL LOW (ref 0.7–4.0)
MCH: 32 pg (ref 26.0–34.0)
MCHC: 31.6 g/dL (ref 30.0–36.0)
MCV: 101.2 fL — ABNORMAL HIGH (ref 80.0–100.0)
Monocytes Absolute: 0.9 10*3/uL (ref 0.1–1.0)
Monocytes Relative: 6 %
Neutro Abs: 15 10*3/uL — ABNORMAL HIGH (ref 1.7–7.7)
Neutrophils Relative %: 91 %
Platelets: 136 10*3/uL — ABNORMAL LOW (ref 150–400)
RBC: 2.53 MIL/uL — ABNORMAL LOW (ref 3.87–5.11)
RDW: 12.9 % (ref 11.5–15.5)
WBC: 16.4 10*3/uL — ABNORMAL HIGH (ref 4.0–10.5)
nRBC: 0 % (ref 0.0–0.2)

## 2020-04-04 LAB — BASIC METABOLIC PANEL
Anion gap: 9 (ref 5–15)
BUN: 42 mg/dL — ABNORMAL HIGH (ref 8–23)
CO2: 20 mmol/L — ABNORMAL LOW (ref 22–32)
Calcium: 8.2 mg/dL — ABNORMAL LOW (ref 8.9–10.3)
Chloride: 106 mmol/L (ref 98–111)
Creatinine, Ser: 1.75 mg/dL — ABNORMAL HIGH (ref 0.44–1.00)
GFR, Estimated: 28 mL/min — ABNORMAL LOW (ref >60)
Glucose, Bld: 105 mg/dL — ABNORMAL HIGH (ref 70–99)
Potassium: 3.9 mmol/L (ref 3.5–5.1)
Sodium: 135 mmol/L (ref 135–145)

## 2020-04-04 MED ORDER — INFLUENZA VAC A&B SA ADJ QUAD 0.5 ML IM PRSY
0.5000 mL | PREFILLED_SYRINGE | INTRAMUSCULAR | Status: DC
  Filled 2020-04-04: qty 0.5

## 2020-04-04 MED ORDER — SODIUM CHLORIDE 0.9 % IV SOLN
510.0000 mg | Freq: Once | INTRAVENOUS | Status: AC
  Administered 2020-04-04: 510 mg via INTRAVENOUS
  Filled 2020-04-04: qty 510

## 2020-04-04 NOTE — Progress Notes (Signed)
PROGRESS NOTE    Glenda King  EXH:[id number] DOB: March 11, 1929 DOA: 04/02/2020 PCP: Javier Glazier, MD    Brief Narrative:  Mrs. Vorhees was admitted to the hospital with the working diagnosis of new onset atrial fibrillation.   86 year old female with past medical history for pancreatic cancer, seizures, hypertension and hypothyroidism.  Reported 3 days of fatigue, generalized weakness, lightheadedness and dizziness.  Intermittent palpitations.  On the day of admission she was seen as a follow-up at her oncologist office, she was noted to be tachycardic, EKG showed atrial fibrillation with heart rate of 120s to 130s.  She was referred to the hospital for further evaluation.  On her initial physical examination blood pressure 96/57, heart rate 118, respiratory rate 18, temperature 98.8 F, oxygen saturation 95%.  She had dry mucous membranes, lungs clear to auscultation bilaterally, heart S1-S2, no gallops or rubs, abdomen soft, no lower extremity edema.   Sodium 133, potassium 4.5, chloride 99, bicarb 22, glucose 242, BUN 43, creatinine 1.8, white count 22.2, hemoglobin 11.0, hematocrit 33.6, platelets 172.  SARS COVID-19 negative.  Urinalysis 21-50 white cells, 11-20 red cells, specific gravity 1.015 Chest radiograph with hyperinflation, increased lung markings bilaterally, no infiltrates.  Abdominal film no bowel obstruction pattern.  EKG #1  143 bpm, normal axis, right bundle branch block, atrial fibrillation rhythm, no ST segment or T wave changes. #2 116 atrial fibrillation rhythm. #3 96 bpm sinus rhythm.   Patient placed on diltiazem infusion for rate control and anticoagulation with heparin.   Paroxysmal atrial fibrillation, transition to oral AV blockade and oral anticoagulation with good toleration.   Monitor telemetry for the next 24 if no further RVR will plan to dc home in am.    Assessment & Plan:   Principal Problem:   New onset a-fib [facility]) Active  Problems:   Malignant neoplasm of bladder, unspecified (HCC)   Hypothyroidism   Chronic kidney disease (CKD) stage G4/A1, severely decreased glomerular filtration rate (GFR) between 15-29 mL/min/1.73 square meter and albuminuria creatinine ratio less than 30 mg/g (HCC)   History of seizure   Anemia in chronic renal disease   History of urinary diversion procedure   Primary pancreatic cancer (HCC)   Acute lower UTI   Elevated troponin    1. New onset atrial fibrillation. Paroxymal atrial fibrillation, telemetry today with sinus rhythm rate in the 60 to 80 at rest.  Echocardiogram with preserved LV and RV systolic function. Has a small posterior (to the LV) pericardial effusion, no tamponade physiology.  ChadsVasc score of 4.  Troponin 20-20 due to atrial fibrillation, no clinical signs of acute coronary syndrome.   Continue rate control with 25 mg po bid and anticoagulation with apixaban.   Out of bed to chair tid with meals, continue PT and OT, will need home health services.   2. Urine infection (no sepsis). Cell count continue to trend down to 16,4 from 17,5.  Culture with multiple bacteria.  Continue with ceftriaxone #2/3.  3. CKD stage 4/ hypomagenesemia/ hypokalemia. Stable renal function with serum cr at 1,75, K is 3,9 and bicarbonate at 20.   Follow up on renal function in am.   4. Pancreatic cancer/ localized/ chronic anemia/ iron def. Stable, follow with outpatient oncology. Patient is tolerating po well.  Old records personally reviewed, patient had radiosurgery on 01/2020. Her oncologist with Dr. Marin Olp.   Hgb is 8,1 with Hct 25.6. Iron panel with iron 15, TIBC 221, transferrin saturation 7, ferritin 196.  Add one  dose of IV iron, hold on oral supplements for now.    5. Seizures. On Keppra. No signs of active seizures.   6. Hypothyroid. TSH 0.83. On levothyroxine.    Status is: Inpatient  Remains inpatient appropriate because:Inpatient level of care  appropriate due to severity of illness   Dispo: The patient is from: Home              Anticipated d/c is to: Home              Anticipated d/c date is: 1 day              Patient currently is not medically stable to d/c.  DVT prophylaxis: apixaban   Code Status:   full  Family Communication:  Patient talking to her brother over the phone       Antimicrobials:   Ceftriaxone     Subjective: Patient is awake and alert, no nausea or vomiting, continue to be very weak and deconditioned, not yet back to baseline. No palpitations or chest pain, no dyspnea.   Objective: Vitals:   04/03/20 1332 04/03/20 2015 04/04/20 0627 04/04/20 1340  BP: (!) 93/52 (!) 112/51 (!) 112/53 (!) 88/52  Pulse: 94 85 98 (!) 56  Resp: 18 20 20 16   Temp: 98.4 F (36.9 C) 100.1 F (37.8 C) 99.7 F (37.6 C) 97.8 F (36.6 C)  TempSrc: Oral   Oral  SpO2: 97% 92% 94% 98%  Weight:      Height:        Intake/Output Summary (Last 24 hours) at 04/04/2020 1421 Last data filed at 04/04/2020 6073 Gross per 24 hour  Intake 486.3 ml  Output 900 ml  Net -413.7 ml   Filed Weights   04/02/20 2012  Weight: 44.4 kg    Examination:   General: Not in pain or dyspnea, deconditioned  Neurology: Awake and alert, non focal  E ENT: mild pallor, no icterus, oral mucosa moist Cardiovascular: No JVD. S1-S2 present, rhythmic, no gallops, rubs, or murmurs. No lower extremity edema. Pulmonary: positive breath sounds bilaterally with no wheezing, rhonchi or rales. Gastrointestinal. Abdomen soft and non tender Skin. No rashes Musculoskeletal: no joint deformities     Data Reviewed: I have personally reviewed following labs and imaging studies  CBC: Recent Labs  Lab 04/02/20 1042 04/02/20 1245 04/02/20 1345 04/03/20 0024 04/04/20 0515  WBC 22.2*  --  20.8* 17.5* 16.4*  NEUTROABS 20.1*  --  19.0* 15.9* 15.0*  HGB 11.0* 10.9* 10.4* 9.0* 8.1*  HCT 33.6* 32.0* 31.9* 27.0* 25.6*  MCV 97.7  --  97.9 97.1  101.2*  PLT 172  --  159 132* 710*   Basic Metabolic Panel: Recent Labs  Lab 04/02/20 1042 04/02/20 1245 04/02/20 1308 04/03/20 0024 04/04/20 0515  NA 133* 135  --  137 135  K 4.5 4.1  --  3.8 3.9  CL 99 99  --  105 106  CO2 22  --   --  20* 20*  GLUCOSE 242* 194*  --  116* 105*  BUN 43* 41*  --  41* 42*  CREATININE 1.88* 2.00*  --  1.62* 1.75*  CALCIUM 9.7  --   --  8.2* 8.2*  MG  --   --  1.7 1.8  --   PHOS  --   --   --  3.3  --    GFR: Estimated Creatinine Clearance: 15.6 mL/min (A) (by C-G formula based on SCr of 1.75 mg/dL (H)).  Liver Function Tests: Recent Labs  Lab 04/02/20 1042 04/03/20 0024  AST 47* 32  ALT 121* 86*  ALKPHOS 270* 206*  BILITOT 3.4* 2.7*  PROT 7.8 6.3*  ALBUMIN 3.5 2.7*   Recent Labs  Lab 04/02/20 1145  LIPASE 44  AMYLASE 57   No results for input(s): AMMONIA in the last 168 hours. Coagulation Profile: Recent Labs  Lab 04/02/20 1308  INR 1.2   Cardiac Enzymes: No results for input(s): CKTOTAL, CKMB, CKMBINDEX, TROPONINI in the last 168 hours. BNP (last 3 results) No results for input(s): PROBNP in the last 8760 hours. HbA1C: No results for input(s): HGBA1C in the last 72 hours. CBG: Recent Labs  Lab 04/03/20 1204 04/03/20 1712  GLUCAP 136* 132*   Lipid Profile: No results for input(s): CHOL, HDL, LDLCALC, TRIG, CHOLHDL, LDLDIRECT in the last 72 hours. Thyroid Function Tests: Recent Labs    04/03/20 0024  TSH 0.839   Anemia Panel: Recent Labs    04/03/20 0024  VITAMINB12 2,067*  FOLATE 16.8  FERRITIN 196  TIBC 221*  IRON 15*  RETICCTPCT 1.1      Radiology Studies: I have reviewed all of the imaging during this hospital visit personally     Scheduled Meds: . apixaban  2.5 mg Oral BID  . ferrous sulfate  325 mg Oral Q breakfast  . levETIRAcetam  500 mg Oral BID  . levothyroxine  100 mcg Oral Q0600  . melatonin  10 mg Oral QHS  . metoprolol tartrate  25 mg Oral BID  . sodium bicarbonate  650 mg Oral  BID   Continuous Infusions: . cefTRIAXone (ROCEPHIN)  IV 1 g (04/03/20 2054)     LOS: 2 days        Judee Hennick Gerome Apley, MD

## 2020-04-05 ENCOUNTER — Ambulatory Visit (HOSPITAL_BASED_OUTPATIENT_CLINIC_OR_DEPARTMENT_OTHER): Payer: Medicare Other

## 2020-04-05 DIAGNOSIS — N184 Chronic kidney disease, stage 4 (severe): Secondary | ICD-10-CM | POA: Diagnosis not present

## 2020-04-05 DIAGNOSIS — I4891 Unspecified atrial fibrillation: Secondary | ICD-10-CM

## 2020-04-05 DIAGNOSIS — N390 Urinary tract infection, site not specified: Secondary | ICD-10-CM | POA: Diagnosis not present

## 2020-04-05 DIAGNOSIS — N189 Chronic kidney disease, unspecified: Secondary | ICD-10-CM | POA: Diagnosis not present

## 2020-04-05 LAB — ABO/RH: ABO/RH(D): A POS

## 2020-04-05 LAB — PREPARE RBC (CROSSMATCH)

## 2020-04-05 MED ORDER — PANTOPRAZOLE SODIUM 40 MG PO TBEC
40.0000 mg | DELAYED_RELEASE_TABLET | Freq: Every day | ORAL | Status: DC
  Administered 2020-04-06 – 2020-04-13 (×7): 40 mg via ORAL
  Filled 2020-04-05 (×7): qty 1

## 2020-04-05 MED ORDER — MEGESTROL ACETATE 20 MG PO TABS
20.0000 mg | ORAL_TABLET | Freq: Every day | ORAL | Status: DC
  Administered 2020-04-05 – 2020-04-06 (×2): 20 mg via ORAL
  Filled 2020-04-05 (×2): qty 1

## 2020-04-05 MED ORDER — FUROSEMIDE 10 MG/ML IJ SOLN
20.0000 mg | Freq: Once | INTRAMUSCULAR | Status: AC
  Administered 2020-04-05: 20 mg via INTRAVENOUS
  Filled 2020-04-05: qty 2

## 2020-04-05 MED ORDER — SODIUM CHLORIDE 0.9 % IV SOLN: 510.0000 mg | Freq: Once | INTRAVENOUS | Status: DC

## 2020-04-05 MED ORDER — INFLUENZA VAC A&B SA ADJ QUAD 0.5 ML IM PRSY
0.5000 mL | PREFILLED_SYRINGE | INTRAMUSCULAR | Status: AC | PRN
  Administered 2020-04-06: 0.5 mL via INTRAMUSCULAR
  Filled 2020-04-05: qty 0.5

## 2020-04-05 MED ORDER — BOOST PLUS PO LIQD
237.0000 mL | Freq: Two times a day (BID) | ORAL | Status: DC
  Administered 2020-04-05 – 2020-04-13 (×10): 237 mL via ORAL
  Filled 2020-04-05 (×17): qty 237

## 2020-04-05 MED ORDER — SODIUM CHLORIDE 0.9% IV SOLUTION: Freq: Once | INTRAVENOUS | Status: AC

## 2020-04-05 MED ORDER — FUROSEMIDE 10 MG/ML IJ SOLN: 20.0000 mg | Freq: Once | INTRAMUSCULAR | Status: DC

## 2020-04-05 NOTE — Progress Notes (Signed)
PT Cancellation Note  Patient Details Name: Glenda King MRN: [id number] DOB: [birth year]   Cancelled Treatment:    Reason Eval/Treat Not Completed: Medical issues which prohibited therapy Spoke with RN in am and pt just starting to get 1st unit of PRBC, when returned in pm 2nd unit starting.  RN reports pt sat in chair mid-day and just returned to bed due to fatigue.  Will f/u as able. Glenda King, PT Acute Rehab Services Pager [phone] [facility] Rehab [phone]    Glenda King 04/05/2020, 4:26 PM

## 2020-04-05 NOTE — Care Management Important Message (Signed)
Important Message  Patient Details IM Letter given to the Patient Name: Glenda King MRN: [id number] Date of Birth: [birth year]   Medicare Important Message Given:  Yes     Kerin Salen 04/05/2020, 12:09 PM

## 2020-04-05 NOTE — Consult Note (Signed)
Referral MD  Reason for Referral: New onset atrial fibrillation, locally advanced pancreatic cancer, anemia.  Chief Complaint  Patient presents with  . A-fib  : I just felt weak.  HPI: Glenda King is a very nice 86 year old white female.  She has localized pancreatic cancer.  She has her ptotic radiosurgery that was completed back in August.  She was seen in the office on Friday, October 22.  She had some lightheadedness.  She had palpitations and weakness.  She was found to have rapid atrial fibrillation.  She was subsequently sent to the emergency room and admitted.  She seems to be back in normal sinus rhythm right now.  She had an echocardiogram done on admission.  Her left ventricular ejection fraction was 60-65%.  She had a small pericardial effusion.  She had normal valvular function.  Her left atrium was normal in size.  The right atrium was normal in size.  Cultures were taken.  A urine culture showed multiple species.  On 23rd, her labs showed a BUN of 41 creatinine 1.62.  Her bilirubin was 2.7.  Her iron studies showed iron saturation of only 7%.  She is quite anemic.  Her hemoglobin is 8.1.  I really think you need to be transfused.  Her CA 19-9 is little bit elevated.  It was 170.  We last saw her in early October, the CA 19-9 was 130.  She was set up for a MRI to be done in a week or so.  I think we should probably get this while she is in the hospital.  She does not have much of an appetite.  She has had no diarrhea.  She has had no pain.  There may be a little bit of pain over on the right side of the abdomen which is transient.  She has had no cough.  There is no obvious jaundice.  On the 22nd, her bilirubin was 3.4.  Currently, her performance status is ECOG 2-3.    Past Medical History:  Diagnosis Date  . Anemia   . Cancer [facility])    bladder cancer  . Cardiomyopathy ([place])   . CKD (chronic kidney disease)   . Goals of care, counseling/discussion  12/10/2019  . Primary pancreatic cancer ([place]) 12/10/2019  . Thyroid disease    hypothyroid  :  Past Surgical History:  Procedure Laterality Date  . BILIARY STENT PLACEMENT N/A 09/05/2019   Procedure: BILIARY STENT PLACEMENT;  Surgeon: Irene Shipper, MD;  Location: [facility] ENDOSCOPY;  Service: Endoscopy;  Laterality: N/A;  . BIOPSY  12/03/2019   Procedure: BIOPSY;  Surgeon: Rush Landmark Telford Nab., MD;  Location: [facility] ENDOSCOPY;  Service: Gastroenterology;;  . ERCP N/A 09/05/2019   Procedure: ENDOSCOPIC RETROGRADE CHOLANGIOPANCREATOGRAPHY (ERCP);  Surgeon: Irene Shipper, MD;  Location: [facility];  Service: Endoscopy;  Laterality: N/A;  . ESOPHAGOGASTRODUODENOSCOPY (EGD) WITH PROPOFOL N/A 12/03/2019   Procedure: ESOPHAGOGASTRODUODENOSCOPY (EGD) WITH PROPOFOL;  Surgeon: Rush Landmark Telford Nab., MD;  Location: [facility];  Service: Gastroenterology;  Laterality: N/A;  . EUS N/A 12/03/2019   Procedure: UPPER ENDOSCOPIC ULTRASOUND (EUS) LINEAR;  Surgeon: Irving Copas., MD;  Location: [facility];  Service: Gastroenterology;  Laterality: N/A;  . FINE NEEDLE ASPIRATION N/A 12/03/2019   Procedure: FINE NEEDLE ASPIRATION (FNA) LINEAR;  Surgeon: Irving Copas., MD;  Location: [facility];  Service: Gastroenterology;  Laterality: N/A;  . SPHINCTEROTOMY  09/05/2019   Procedure: SPHINCTEROTOMY;  Surgeon: Irene Shipper, MD;  Location: [facility];  Service: Endoscopy;;  :  Current Facility-Administered Medications:  .  0.9 %  sodium chloride infusion (Manually program via Guardrails IV Fluids), , Intravenous, Once, Shalini Mair, Rudell Cobb, MD .  acetaminophen (TYLENOL) tablet 650 mg, 650 mg, Oral, Q6H PRN **OR** acetaminophen (TYLENOL) suppository 650 mg, 650 mg, Rectal, Q6H PRN, Doutova, Anastassia, MD .  apixaban (ELIQUIS) tablet 2.5 mg, 2.5 mg, Oral, BID, Lenis Noon, RPH, 2.5 mg at 04/04/20 2121 .  furosemide (LASIX) injection 20 mg, 20 mg, Intravenous, Once, Denvil Canning R, MD .   furosemide (LASIX) injection 20 mg, 20 mg, Intravenous, Once, Chinara Hertzberg, Rudell Cobb, MD .  HYDROcodone-acetaminophen (NORCO/VICODIN) 5-325 MG per tablet 1-2 tablet, 1-2 tablet, Oral, Q4H PRN, Toy Baker, MD, 1 tablet at 04/04/20 0922 .  influenza vaccine adjuvanted (FLUAD) injection 0.5 mL, 0.5 mL, Intramuscular, Tomorrow-1000, Arrien, Jimmy Picket, MD .  levETIRAcetam (KEPPRA) tablet 500 mg, 500 mg, Oral, BID, Doutova, Anastassia, MD, 500 mg at 04/04/20 2121 .  levothyroxine (SYNTHROID) tablet 100 mcg, 100 mcg, Oral, Q0600, Doutova, Anastassia, MD, 100 mcg at 04/05/20 0536 .  melatonin tablet 10 mg, 10 mg, Oral, QHS, Doutova, Anastassia, MD, 10 mg at 04/04/20 2121 .  metoprolol tartrate (LOPRESSOR) injection 5 mg, 5 mg, Intravenous, Q4H PRN, Arrien, Jimmy Picket, MD .  metoprolol tartrate (LOPRESSOR) tablet 25 mg, 25 mg, Oral, BID, Arrien, Jimmy Picket, MD, 25 mg at 04/04/20 1828 .  sodium bicarbonate tablet 650 mg, 650 mg, Oral, BID, Doutova, Anastassia, MD, 650 mg at 04/04/20 2121:  . sodium chloride   Intravenous Once  . apixaban  2.5 mg Oral BID  . furosemide  20 mg Intravenous Once  . furosemide  20 mg Intravenous Once  . influenza vaccine adjuvanted  0.5 mL Intramuscular Tomorrow-1000  . levETIRAcetam  500 mg Oral BID  . levothyroxine  100 mcg Oral Q0600  . melatonin  10 mg Oral QHS  . metoprolol tartrate  25 mg Oral BID  . sodium bicarbonate  650 mg Oral BID  :  Allergies  Allergen Reactions  . Gadolinium Derivatives Other (See Comments)    Secondary to her stage IV kidney disease   . Amoxicillin Other (See Comments)    UNKNOWN REACTION     . Pollen Extract Other (See Comments)    Sneezing  :  Family History  Problem Relation Age of Onset  . Hypertension Other   :  Social History   Socioeconomic History  . Marital status: Married    Spouse name: Not on file  . Number of children: Not on file  . Years of education: Not on file  . Highest education  level: Not on file  Occupational History  . Not on file  Tobacco Use  . Smoking status: Never Smoker  . Smokeless tobacco: Never Used  Vaping Use  . Vaping Use: Never used  Substance and Sexual Activity  . Alcohol use: Not Currently  . Drug use: Not Currently  . Sexual activity: Not on file  Other Topics Concern  . Not on file  Social History Narrative  . Not on file   Social Determinants of Health   Financial Resource Strain:   . Difficulty of Paying Living Expenses: Not on file  Food Insecurity:   . Worried About Charity fundraiser in the Last Year: Not on file  . Ran Out of Food in the Last Year: Not on file  Transportation Needs:   . Lack of Transportation (Medical): Not on file  . Lack of Transportation (Non-Medical): Not  on file  Physical Activity:   . Days of Exercise per Week: Not on file  . Minutes of Exercise per Session: Not on file  Stress:   . Feeling of Stress : Not on file  Social Connections:   . Frequency of Communication with Friends and Family: Not on file  . Frequency of Social Gatherings with Friends and Family: Not on file  . Attends Religious Services: Not on file  . Active Member of Clubs or Organizations: Not on file  . Attends Archivist Meetings: Not on file  . Marital Status: Not on file  Intimate Partner Violence:   . Fear of Current or Ex-Partner: Not on file  . Emotionally Abused: Not on file  . Physically Abused: Not on file  . Sexually Abused: Not on file  :  Pertinent items are noted in HPI.  Exam:  This is an elderly, thin white female in no obvious distress.  Her vital signs are temperature 99.5.  Pulse 75.  Blood pressure is 104/66.  Oxygen saturation on room air is 94%.  Her head and neck exam shows no scleral icterus.  She has no adenopathy in the neck.  Her lungs are clear bilaterally.  Cardiac exam regular rate and rhythm.  I do not hear any obvious irregularity that would suggest atrial fibrillation.  Abdomen is  soft.  There is no fluid wave.  There is no abdominal mass.  There is no guarding or rebound tenderness.  I cannot palpate her liver or spleen tip.  Extremities shows muscle atrophy in upper and lower extremities.  Neurological exam is nonfocal.  Skin exam shows no obvious jaundice.  Patient Vitals for the past 24 hrs:  BP Temp Temp src Pulse Resp SpO2  04/05/20 0536 104/66 99.5 F (37.5 C) Oral 75 20 94 %  04/04/20 2234 120/62 98.3 F (36.8 C) Oral 64 16 93 %  04/04/20 1829 111/69 -- -- 62 -- --  04/04/20 1646 (!) 96/45 -- -- 62 -- --  04/04/20 1340 (!) 88/52 97.8 F (36.6 C) Oral (!) 56 16 98 %     Recent Labs    04/03/20 0024 04/04/20 0515  WBC 17.5* 16.4*  HGB 9.0* 8.1*  HCT 27.0* 25.6*  PLT 132* 136*   Recent Labs    04/03/20 0024 04/04/20 0515  NA 137 135  K 3.8 3.9  CL 105 106  CO2 20* 20*  GLUCOSE 116* 105*  BUN 41* 42*  CREATININE 1.62* 1.75*  CALCIUM 8.2* 8.2*    Blood smear review: None  Pathology: None    Assessment and Plan: Ms. Ehresman is a very charming 86 year old white female.  She has localized pancreatic cancer.  This was not operable.  She was treated with stereotactic radiosurgery.  She was scheduled for an MRI next week to assess for response.  Patient has a onset of atrial fibrillation.  This seems to be transient.  I am unsure if this is from her anemia.  She seems to be in normal sinus rhythm by monitor.  I will try to hold off on anticoagulating her.  She seems to be in normal sinus rhythm right now.  My concern is the anemia.  She is quite iron deficient.  I will be worried that she is having some kind of GI bleeding.  I would check her stool for blood.  She needs have a blood transfusion.  I talked to her about this.  She is agreeable to the blood  transfusion.  I think the MRI will be helpful.  Again I am not sure why she would have the elevated bilirubin even though is not that bad.  She is all about quality of life.  It would be  nice if we could cure this pancreatic cancer and since it is localized.  However, this might be difficult without actual surgery.  We will hopefully be able to get her home real soon.  Again I think the blood transfusion and IV iron will be incredibly helpful.  The MRI being done while she is in the hospital is also helpful.  I know she will get incredible care from all the staff on 4 W.  Glenda Haw, MD  Colossians 1:17

## 2020-04-05 NOTE — Progress Notes (Signed)
PROGRESS NOTE    FAITH PATRICELLI  ZMO:[id number] DOB: January 05, 1929 DOA: 04/02/2020 PCP: Javier Glazier, MD    Brief Narrative:  Mrs. Mcclatchey was admitted to the hospital with the working diagnosis of new onset atrial fibrillation.  86 year old female with past medical history for pancreatic cancer, seizures, hypertensionandhypothyroidism. Reported 3 days of fatigue, generalized weakness, lightheadedness and dizziness. Intermittent palpitations. On the day of admission she was seen as a follow-up at her oncologist office, she was noted to be tachycardic, EKG showed atrial fibrillation with heart rate of 120s to 130s. She was referred to the hospital for further evaluation. On her initial physical examination blood pressure 96/57, heart rate 118, respiratory rate 18, temperature 98.8 F, oxygen saturation 95%. She had dry mucous membranes, lungs clear to auscultation bilaterally, heart S1-S2, no gallops or rubs, abdomen soft, no lower extremity edema.  Sodium 133, potassium 4.5, chloride 99, bicarb 22, glucose 242, BUN 43, creatinine 1.8, white count 22.2, hemoglobin 11.0, hematocrit 33.6, platelets 172. SARS COVID-19 negative. Urinalysis 21-50 white cells, 11-20 red cells, specific gravity 1.015 Chest radiograph with hyperinflation, increased lung markings bilaterally, no infiltrates. Abdominal film no bowel obstruction pattern.  EKG #1143 bpm, normal axis, right bundle branch block, atrial fibrillation rhythm, no ST segment or T wave changes. #2 116 atrial fibrillation rhythm. #3 96 bpm sinus rhythm.  Patient placed on diltiazem infusion for rate control and anticoagulation with heparin.  Paroxysmal atrial fibrillation, transition to oral AV blockade and oral anticoagulation with good toleration.   Monitor telemetry for the next 24 if no further RVR will plan to dc home in am.     Assessment & Plan:   Principal Problem:   New onset a-fib [facility]) Active  Problems:   Malignant neoplasm of bladder, unspecified (HCC)   Hypothyroidism   Chronic kidney disease (CKD) stage G4/A1, severely decreased glomerular filtration rate (GFR) between 15-29 mL/min/1.73 square meter and albuminuria creatinine ratio less than 30 mg/g (HCC)   History of seizure   Anemia in chronic renal disease   History of urinary diversion procedure   Primary pancreatic cancer (HCC)   Acute lower UTI   Elevated troponin   1. New onset atrial fibrillation. Paroxymal atrial fibrillation,back to sinus rhythm. Echocardiogram with preserved LV and RV systolic function. Has a small posterior (to the LV) pericardial effusion, no tamponade physiology.  ChadsVasc score of 4.  Troponin 20-20 due to atrial fibrillation, no clinical signs of acute coronary syndrome.   Tolerating well metoprolol at 25 mg po bid, keep MAP 60 or above. Per Dr Marin Olp primary oncologist, patient with high risk of bleeding and apixaban has been held.  Follow on hemoccult.  If no bleeding identified, patient will benefit from anticoagulation.   One dose of furosemide to prevent volume overload (no active heart failure).   2. Urine infection (no sepsis).  Completed antibiotic therapy today.   3. CKD stage 4/ hypomagenesemia/ hypokalemia. Follow up on renal function in am, patient has poor oral intake, no nausea or vomiting,  Continue oral sodium bicarbonate.   4. Pancreatic cancer/ localized/ chronic anemia/ iron def. Stable, follow with outpatient oncology. Patient is tolerating po well.  Old records personally reviewed, patient had radiosurgery on 01/2020. Her oncologist with Dr. Marin Olp.   Iron panel with iron 15, TIBC 221, transferrin saturation 7, ferritin 196.   Patient will have 2 units PRBC transfuse today and follow up on abdominal MRI. Check cell count in am.  Possible discharge home in am and  follow up with Dr Marin Olp at the outpatient cancer center.   5. Seizures. Continue with  Keppra..   6. Hypothyroid. TSH 0.83. Continue with levothyroxine.   Status is: Inpatient  Remains inpatient appropriate because:IV treatments appropriate due to intensity of illness or inability to take PO   Dispo: The patient is from: Home              Anticipated d/c is to: Home              Anticipated d/c date is: 1 day              Patient currently is not medically stable to d/c. Possible dc home in am.     DVT prophylaxis: scd   Code Status:   dnr   Family Communication:  No family at the bedside, she has been in contact with her brother.       Consultants:   Oncology    Subjective: Patient feeling fatigued, no nausea or vomiting, no dyspnea or chest pain.   Objective: Vitals:   04/04/20 1829 04/04/20 2234 04/05/20 0536 04/05/20 0920  BP: 111/69 120/62 104/66 (!) 112/52  Pulse: 62 64 75 72  Resp:  16 20   Temp:  98.3 F (36.8 C) 99.5 F (37.5 C)   TempSrc:  Oral Oral   SpO2:  93% 94%   Weight:      Height:        Intake/Output Summary (Last 24 hours) at 04/05/2020 0940 Last data filed at 04/04/2020 2300 Gross per 24 hour  Intake 217 ml  Output 290 ml  Net -73 ml   Filed Weights   04/02/20 2012  Weight: 44.4 kg    Examination:   General: Not in pain or dyspnea. Deconditioned  Neurology: Awake and alert, non focal  E ENT: positive pallor, no icterus, oral mucosa moist Cardiovascular: No JVD. S1-S2 present, rhythmic, no gallops, rubs, or murmurs. No lower extremity edema. Pulmonary: positive breath sounds bilaterally,with no wheezing, rhonchi or rales. Gastrointestinal. Abdomen soft and non tender Skin. No rashes Musculoskeletal: no joint deformities     Data Reviewed: I have personally reviewed following labs and imaging studies  CBC: Recent Labs  Lab 04/02/20 1042 04/02/20 1245 04/02/20 1345 04/03/20 0024 04/04/20 0515  WBC 22.2*  --  20.8* 17.5* 16.4*  NEUTROABS 20.1*  --  19.0* 15.9* 15.0*  HGB 11.0* 10.9* 10.4* 9.0* 8.1*    HCT 33.6* 32.0* 31.9* 27.0* 25.6*  MCV 97.7  --  97.9 97.1 101.2*  PLT 172  --  159 132* 462*   Basic Metabolic Panel: Recent Labs  Lab 04/02/20 1042 04/02/20 1245 04/02/20 1308 04/03/20 0024 04/04/20 0515  NA 133* 135  --  137 135  K 4.5 4.1  --  3.8 3.9  CL 99 99  --  105 106  CO2 22  --   --  20* 20*  GLUCOSE 242* 194*  --  116* 105*  BUN 43* 41*  --  41* 42*  CREATININE 1.88* 2.00*  --  1.62* 1.75*  CALCIUM 9.7  --   --  8.2* 8.2*  MG  --   --  1.7 1.8  --   PHOS  --   --   --  3.3  --    GFR: Estimated Creatinine Clearance: 15.6 mL/min (A) (by C-G formula based on SCr of 1.75 mg/dL (H)). Liver Function Tests: Recent Labs  Lab 04/02/20 1042 04/03/20 0024  AST 47* 32  ALT 121* 86*  ALKPHOS 270* 206*  BILITOT 3.4* 2.7*  PROT 7.8 6.3*  ALBUMIN 3.5 2.7*   Recent Labs  Lab 04/02/20 1145  LIPASE 44  AMYLASE 57   No results for input(s): AMMONIA in the last 168 hours. Coagulation Profile: Recent Labs  Lab 04/02/20 1308  INR 1.2   Cardiac Enzymes: No results for input(s): CKTOTAL, CKMB, CKMBINDEX, TROPONINI in the last 168 hours. BNP (last 3 results) No results for input(s): PROBNP in the last 8760 hours. HbA1C: No results for input(s): HGBA1C in the last 72 hours. CBG: Recent Labs  Lab 04/03/20 1204 04/03/20 1712  GLUCAP 136* 132*   Lipid Profile: No results for input(s): CHOL, HDL, LDLCALC, TRIG, CHOLHDL, LDLDIRECT in the last 72 hours. Thyroid Function Tests: Recent Labs    04/03/20 0024  TSH 0.839   Anemia Panel: Recent Labs    04/03/20 0024  VITAMINB12 2,067*  FOLATE 16.8  FERRITIN 196  TIBC 221*  IRON 15*  RETICCTPCT 1.1      Radiology Studies: I have reviewed all of the imaging during this hospital visit personally     Scheduled Meds: . sodium chloride   Intravenous Once  . furosemide  20 mg Intravenous Once  . influenza vaccine adjuvanted  0.5 mL Intramuscular Tomorrow-1000  . levETIRAcetam  500 mg Oral BID  .  levothyroxine  100 mcg Oral Q0600  . melatonin  10 mg Oral QHS  . metoprolol tartrate  25 mg Oral BID  . sodium bicarbonate  650 mg Oral BID   Continuous Infusions: . [START ON 04/07/2020] ferumoxytol       LOS: 3 days        Irva Loser Gerome Apley, MD

## 2020-04-05 NOTE — TOC Progression Note (Signed)
Transition of Care [facility]) - Progression Note    Patient Details  Name: Glenda King MRN: [id number] Date of Birth: 04-29-1929  [facility]) CM/SW Contact  Purcell Mouton, RN Phone Number: 04/05/2020, 1:55 PM  Clinical Narrative:     Pt from [facility] and may go to SNF if needed.   Expected Discharge Plan: Assisted Living Barriers to Discharge: No Barriers Identified  Expected Discharge Plan and Services Expected Discharge Plan: Assisted Living       Living arrangements for the past 2 months: Assisted Living Facility                                       Social Determinants of Health (SDOH) Interventions    Readmission Risk Interventions No flowsheet data found.

## 2020-04-05 NOTE — Progress Notes (Signed)
Initial Nutrition Assessment  DOCUMENTATION CODES:   Severe malnutrition in context of chronic illness, Underweight  INTERVENTION:  - will order Boost Plus BID, each supplement provides 360 kcal and 14 grams of protein. - recommend trial of appetite stimulant.   NUTRITION DIAGNOSIS:   Severe Malnutrition related to chronic illness, cancer and cancer related treatments as evidenced by moderate fat depletion, moderate muscle depletion, severe fat depletion, severe muscle depletion.  GOAL:   Patient will meet greater than or equal to 90% of their needs  MONITOR:   PO intake, Supplement acceptance, Labs, Weight trends  REASON FOR ASSESSMENT:   Consult Assessment of nutrition requirement/status  ASSESSMENT:   86 year old female with medical history of pancreatic cancer, seizures, HTN, and hypothyroidism. She presented to the ED with 3 day hx of fatigue, generalized weakness, lightheadedness, dizziness, and intermittent palpitations.  No intakes documented since admission on 10/22. Patient laying in bed and no family/visitors present at bedside. She reports being very tired and planning to take a nap after RD visit; kept visit on the shorter side for this reason.  She states ongoing abdominal pain/pressure since PTA which is somewhat usual for her. She denies any nausea. She denies any chewing or swallowing difficulties. She limits salty and sugary foods and beverages. She denies any taste alterations but has been experiencing anorexia/poor appetite for several months which makes it difficult for her to desire to eat.  She tried to eat the best she could for lunch: meat loaf, a veggie, and salad. She has been sipping on water and tea throughout the day to avoid dry mouth.   Weight has been stable since 11/2019; patient confirms this. She was drinking Boost PTA and states that she has some remaining in her refrigerator at [employer].     Labs reviewed; BUN: 42 mg/dl, creatinine: 1.75  mg/dl, Ca: 8.2 mg/dl, GFR: 28 ml/min. Medications reviewed; 510 mg feraheme x1 dose 10/24 and x1 dose 10/27, 20 mg IV lasix x1 dose 10/25, 100 mcg oral synthroid/day, 10 mg melatonin/night, 650 mg oral sodium bicarb BID.     NUTRITION - FOCUSED PHYSICAL EXAM:    Most Recent Value  Orbital Region Severe depletion  Upper Arm Region Severe depletion  Thoracic and Lumbar Region Severe depletion  Buccal Region Moderate depletion  Temple Region Moderate depletion  Clavicle Bone Region Severe depletion  Clavicle and Acromion Bone Region Severe depletion  Scapular Bone Region Severe depletion  Dorsal Hand Severe depletion  Patellar Region Unable to assess  Anterior Thigh Region Unable to assess  Posterior Calf Region Unable to assess  Edema (RD Assessment) Unable to assess  Hair Reviewed  Eyes Reviewed  Mouth Reviewed  Skin Reviewed  Nails Reviewed       Diet Order:   Diet Order            Diet Heart Room service appropriate? Yes; Fluid consistency: Thin  Diet effective now                 EDUCATION NEEDS:   No education needs have been identified at this time  Skin:  Skin Assessment: Reviewed RN Assessment  Last BM:  10/22  Height:   Ht Readings from Last 1 Encounters:  04/02/20 5' 8.4" (1.737 m)    Weight:   Wt Readings from Last 1 Encounters:  04/02/20 44.4 kg    Estimated Nutritional Needs:  Kcal:  8756-4332 kcal Protein:  65-75 grams Fluid:  >/= 1.7 L/day     Jarome Matin,  MS, RD, LDN, CNSC Inpatient Clinical Dietitian RD pager # available in [place]  After hours/weekend pager # available in [facility]

## 2020-04-06 ENCOUNTER — Encounter (HOSPITAL_COMMUNITY): Payer: Self-pay | Admitting: Internal Medicine

## 2020-04-06 ENCOUNTER — Inpatient Hospital Stay: Payer: Medicare Other | Admitting: Nutrition

## 2020-04-06 ENCOUNTER — Inpatient Hospital Stay (HOSPITAL_COMMUNITY): Payer: Medicare Other

## 2020-04-06 DIAGNOSIS — E43 Unspecified severe protein-calorie malnutrition: Secondary | ICD-10-CM | POA: Diagnosis present

## 2020-04-06 DIAGNOSIS — I4891 Unspecified atrial fibrillation: Secondary | ICD-10-CM | POA: Diagnosis not present

## 2020-04-06 LAB — BASIC METABOLIC PANEL
Anion gap: 11 (ref 5–15)
BUN: 53 mg/dL — ABNORMAL HIGH (ref 8–23)
CO2: 20 mmol/L — ABNORMAL LOW (ref 22–32)
Calcium: 8.2 mg/dL — ABNORMAL LOW (ref 8.9–10.3)
Chloride: 105 mmol/L (ref 98–111)
Creatinine, Ser: 1.73 mg/dL — ABNORMAL HIGH (ref 0.44–1.00)
GFR, Estimated: 28 mL/min — ABNORMAL LOW (ref >60)
Glucose, Bld: 110 mg/dL — ABNORMAL HIGH (ref 70–99)
Potassium: 3.3 mmol/L — ABNORMAL LOW (ref 3.5–5.1)
Sodium: 136 mmol/L (ref 135–145)

## 2020-04-06 LAB — TYPE AND SCREEN
ABO/RH(D): A POS
Antibody Screen: POSITIVE
Donor AG Type: NEGATIVE
Donor AG Type: NEGATIVE
PT AG Type: NEGATIVE
Unit division: 0
Unit division: 0

## 2020-04-06 LAB — BPAM RBC
Blood Product Expiration Date: 202111152359
Blood Product Expiration Date: 202111162359
ISSUE DATE / TIME: 202110251205
ISSUE DATE / TIME: 202110251601
Unit Type and Rh: 6200
Unit Type and Rh: 6200

## 2020-04-06 LAB — CBC WITH DIFFERENTIAL/PLATELET
Abs Immature Granulocytes: 0.4 10*3/uL — ABNORMAL HIGH (ref 0.00–0.07)
Basophils Absolute: 0.1 10*3/uL (ref 0.0–0.1)
Basophils Relative: 0 %
Eosinophils Absolute: 0 10*3/uL (ref 0.0–0.5)
Eosinophils Relative: 0 %
HCT: 33.4 % — ABNORMAL LOW (ref 36.0–46.0)
Hemoglobin: 11.2 g/dL — ABNORMAL LOW (ref 12.0–15.0)
Immature Granulocytes: 2 %
Lymphocytes Relative: 2 %
Lymphs Abs: 0.4 10*3/uL — ABNORMAL LOW (ref 0.7–4.0)
MCH: 32.2 pg (ref 26.0–34.0)
MCHC: 33.5 g/dL (ref 30.0–36.0)
MCV: 96 fL (ref 80.0–100.0)
Monocytes Absolute: 0.9 10*3/uL (ref 0.1–1.0)
Monocytes Relative: 4 %
Neutro Abs: 17.8 10*3/uL — ABNORMAL HIGH (ref 1.7–7.7)
Neutrophils Relative %: 92 %
Platelets: 131 10*3/uL — ABNORMAL LOW (ref 150–400)
RBC: 3.48 MIL/uL — ABNORMAL LOW (ref 3.87–5.11)
RDW: 13.7 % (ref 11.5–15.5)
WBC: 19.5 10*3/uL — ABNORMAL HIGH (ref 4.0–10.5)
nRBC: 0 % (ref 0.0–0.2)

## 2020-04-06 LAB — SARS CORONAVIRUS 2 BY RT PCR (HOSPITAL ORDER, PERFORMED IN ~~LOC~~ HOSPITAL LAB): SARS Coronavirus 2: NEGATIVE

## 2020-04-06 LAB — GLUCOSE, CAPILLARY: Glucose-Capillary: 106 mg/dL — ABNORMAL HIGH (ref 70–99)

## 2020-04-06 IMAGING — MR MR 3D RECON AT SCANNER
10 of 15 series · 36 of 48 positions shown · non-contrast
Comparison: CT abdomen pelvis, [DATE], MR abdomen, [DATE]

CLINICAL DATA: Pancreatic cancer, bladder cancer, assess treatment
response,

EXAM:
MRI ABDOMEN WITHOUT CONTRAST
TECHNIQUE: Multiplanar multisequence MR imaging was performed without the
administration of intravenous contrast. Patient declined contrast
administration.

[Series 4: T2 fat-sat · axial · 6.0mm · 1.14mm/px · z∈[-219,+55]mm · 2 of 39 slices shown]
[im 1/39]
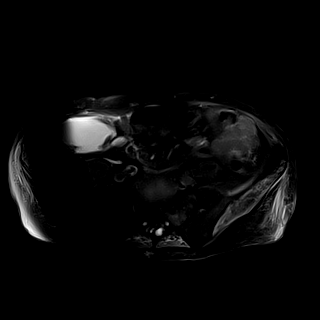
[im 39/39]
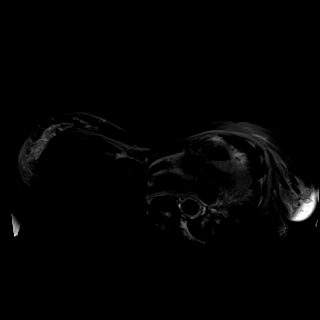

[Series 6: T2 · axial · 6.0mm · 1.17mm/px · z∈[-206,+39]mm · 2 of 35 slices shown (1 of 2)]
[im 1/35]
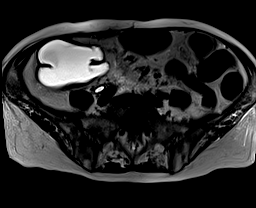
[im 35/35]
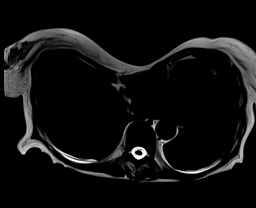

[Series 7: DWI · axial · 6.0mm · 1.36mm/px · z∈[-214,+45]mm · 5 of 73 slices shown (1 of 2)]
[im 1/73]
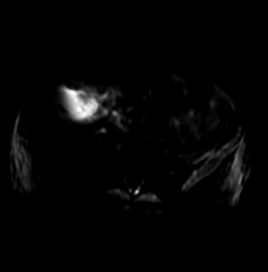
[im 19/73]
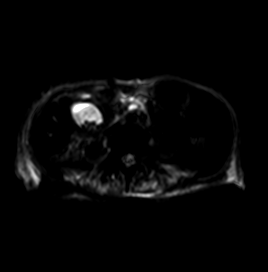
[im 37/73]
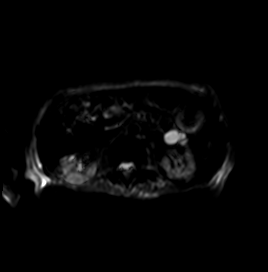
[im 55/73]
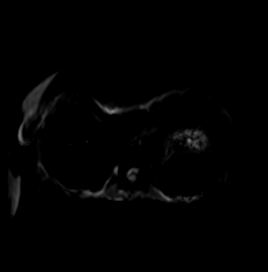
[im 73/73]
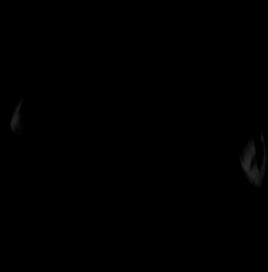

[Series 8: DWI · axial · 6.0mm · 1.36mm/px · z∈[-214,+45]mm · 3 of 37 slices shown (2 of 2)]
[im 1/37]
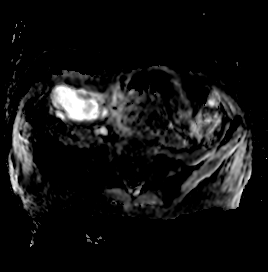
[im 19/37]
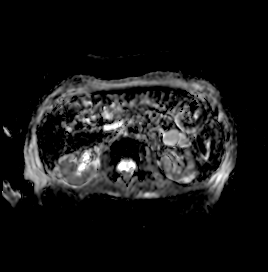
[im 37/37]
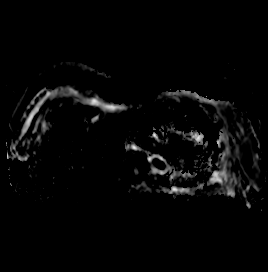

[Series 10: T1 dynamic · axial · 3.0mm · 1.02mm/px · z∈[-213,+47]mm · 6 of 88 slices shown]
[im 1/88]
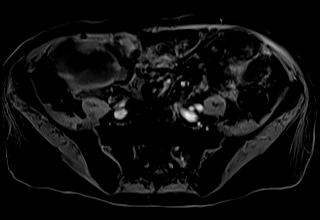
[im 18/88]
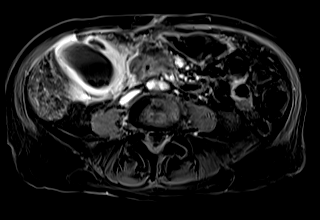
[im 35/88]
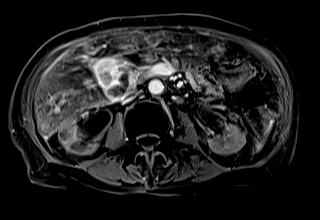
[im 53/88]
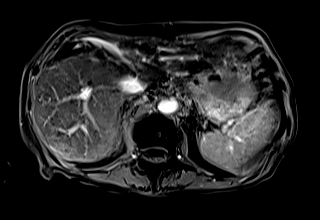
[im 70/88]
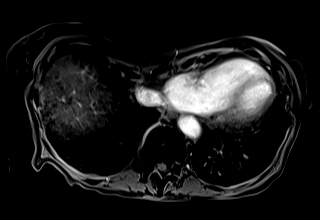
[im 88/88]
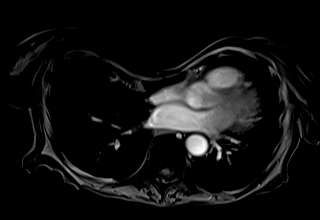

[Series 11: T1 · axial · 3.0mm · 1.02mm/px · z∈[-217,+43]mm · 6 of 88 slices shown (1 of 2)]
[im 1/88]
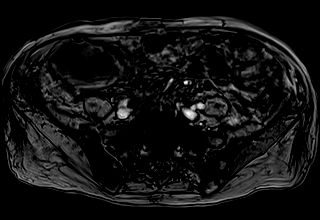
[im 18/88]
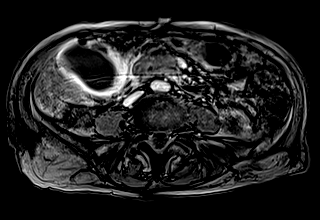
[im 35/88]
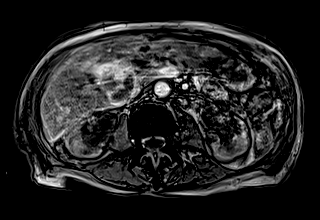
[im 53/88]
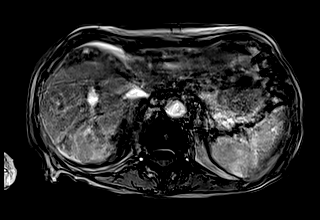
[im 70/88]
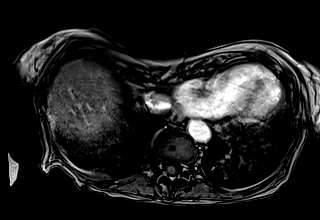
[im 88/88]
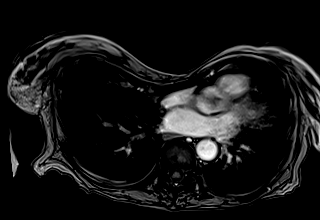

[Series 12: T1 · axial · 3.0mm · 1.02mm/px · z∈[-217,+43]mm · 6 of 88 slices shown (2 of 2)]
[im 1/88]
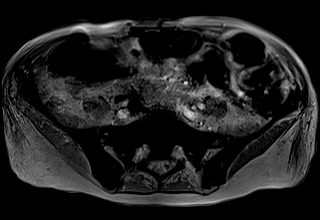
[im 18/88]
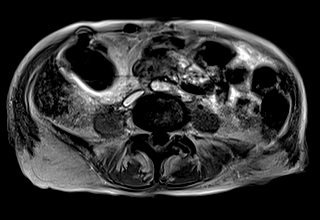
[im 35/88]
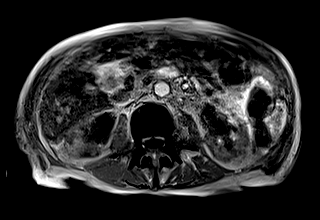
[im 53/88]
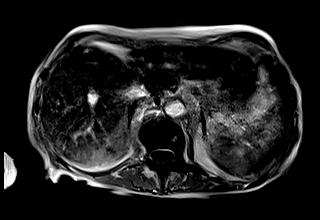
[im 70/88]
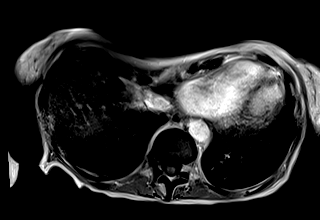
[im 88/88]
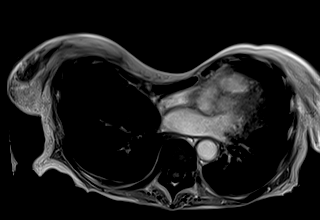

[Series 13: T2 · coronal · 6.0mm · 1.27mm/px · 2 of 27 slices shown (2 of 2)]
[im 1/27]
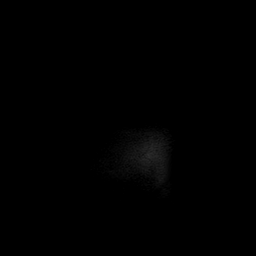
[im 27/27]
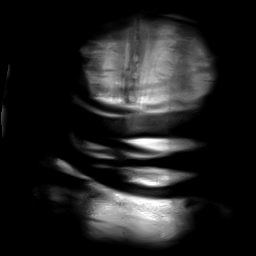

[Series 15: cor obl thk · oblique · 50.0mm · 0.78mm/px · 1 of 9 slices shown]
[im 1/9]
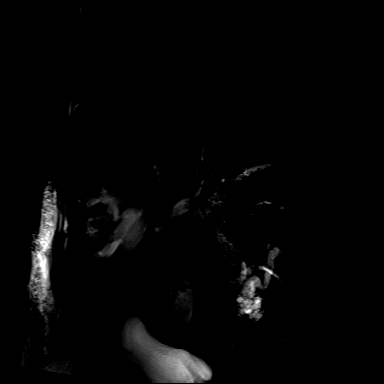

[Series 17: cor_3d_spc_trig · coronal · 1.0mm · 0.42mm/px · 3 of 80 slices shown]
[im 1/80]
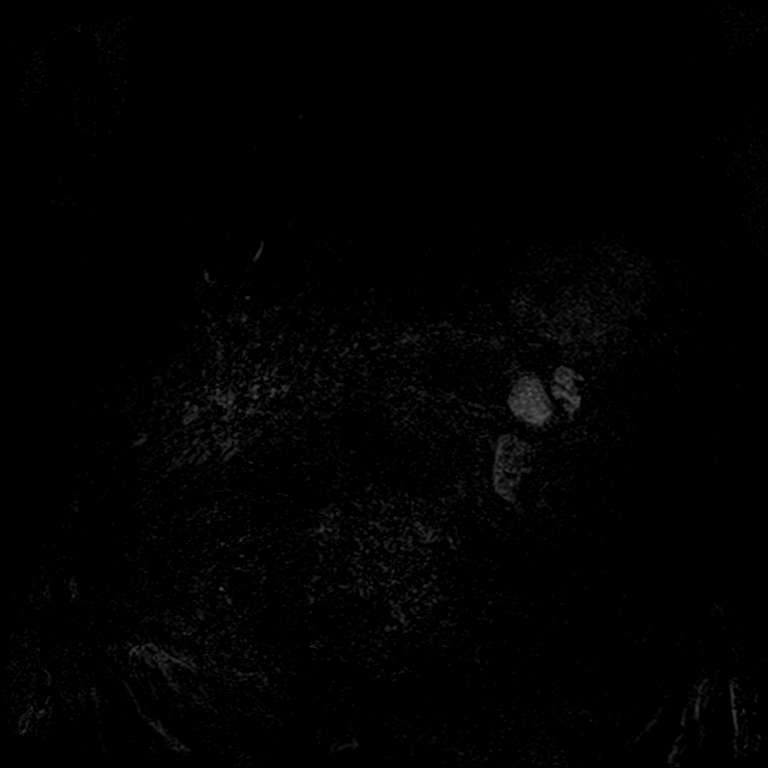
[im 16/80]
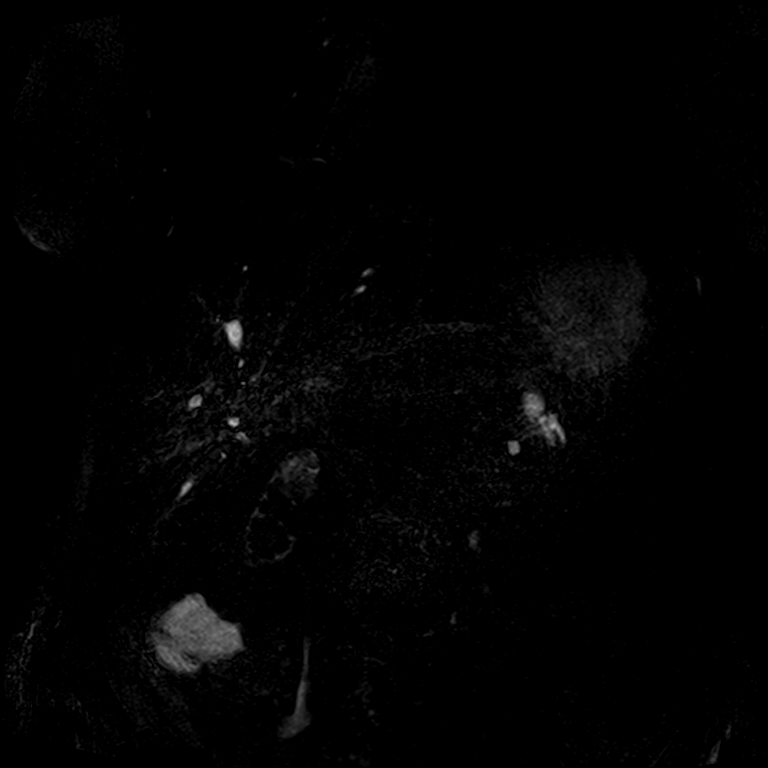
[im 32/80]
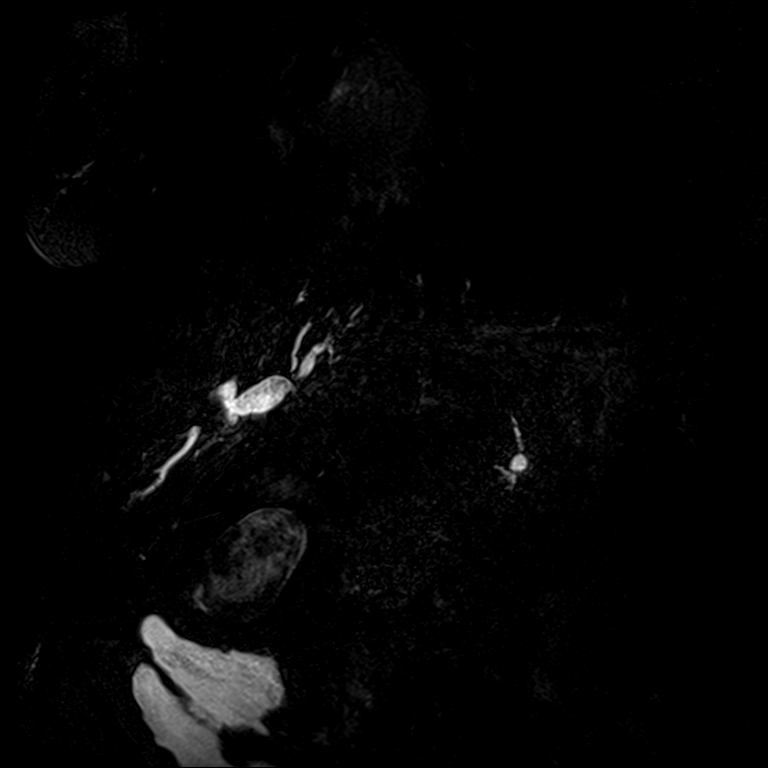

[36 of 48 positions shown; findings below may reference images not displayed]

FINDINGS: Examination is generally limited throughout by breath motion
artifact and lack of intravenous contrast.

Lower chest: No acute findings.

Hepatobiliary: There is intra and extrahepatic biliary ductal
dilatation, similar to prior examination, with abrupt obstruction of
the central common bile duct in the pancreatic head. There appears
to be significant scarring and volume loss of the left lobe of the
liver. A common bile duct stent appears to be present but is poorly
imaged by MR. Numerous gallstones in the gallbladder with severe
thickening and inflammatory change about the gallbladder wall, with
what appear to be multiple ulcerations of the gallbladder wall
(series 10, image 70, 62). There appears to be a gallstone near the
gallbladder neck (series 4, image 24).

Pancreas: Subtly T1 and T2 hypointense, small mass of the central
pancreatic head is again noted, difficult to accurately measure
given lack of intravenous contrast and small size but not obviously
changed compared to prior examinations (series 10, image 71).
Inflammatory changes, or other parenchymal abnormality identified.

Spleen:  Within normal limits in size and appearance.

Adrenals/Urinary Tract: Atrophic appearance of the bilateral
kidneys, with moderate bilateral hydronephrosis.

Stomach/Bowel: Visualized portions within the abdomen are
unremarkable.

Vascular/Lymphatic: No pathologically enlarged lymph nodes
identified. No abdominal aortic aneurysm demonstrated.

Other:  None.

Musculoskeletal: No suspicious bone lesions identified. Pectus
deformity of the included lower chest.
IMPRESSION: 1. Examination is very limited by lack of intravenous contrast and
breath motion artifact.
2. Numerous gallstones in the gallbladder with severe thickening and
inflammatory change about the gallbladder wall, with what appear to
be multiple ulcerations of the gallbladder wall. Findings are highly
concerning for acute cholecystitis. There appears to be a gallstone
near the gallbladder neck, which may be obstructing.
3. Subtly T1 and T2 hypointense, small mass of the central
pancreatic head is again noted, difficult to accurately measure
given lack of intravenous contrast and small size but not obviously
changed compared to prior examinations. This remains consistent with
small pancreatic adenocarcinoma.
4. Intra and extrahepatic biliary ductal dilatation, with abrupt
obstruction of the central common bile duct in the pancreatic head.
A common bile duct stent appears to be present but is poorly imaged
by MR.
5. Marked postobstructive scarring and volume loss of the left lobe
of the liver, similar to prior examination.
6. Moderate bilateral hydronephrosis, slightly increased compared to
prior examination. Distal ureters and bladder are not imaged.
Consider additional imaging of the pelvis given reported history of
bladder cancer.

## 2020-04-06 MED ORDER — METOPROLOL TARTRATE 25 MG PO TABS: 25.0000 mg | ORAL_TABLET | Freq: Two times a day (BID) | ORAL | 0 refills | Status: DC

## 2020-04-06 MED ORDER — CEPHALEXIN 250 MG PO CAPS: 250.0000 mg | ORAL_CAPSULE | Freq: Two times a day (BID) | ORAL | 0 refills | Status: DC

## 2020-04-06 MED ORDER — PANTOPRAZOLE SODIUM 40 MG PO TBEC: 40.0000 mg | DELAYED_RELEASE_TABLET | Freq: Every day | ORAL | 0 refills | Status: DC

## 2020-04-06 MED ORDER — METRONIDAZOLE IN NACL 5-0.79 MG/ML-% IV SOLN
500.0000 mg | Freq: Three times a day (TID) | INTRAVENOUS | Status: DC
  Administered 2020-04-06 – 2020-04-13 (×21): 500 mg via INTRAVENOUS
  Filled 2020-04-06 (×20): qty 100

## 2020-04-06 MED ORDER — SULFAMETHOXAZOLE-TRIMETHOPRIM 800-160 MG PO TABS: 1.0000 | ORAL_TABLET | Freq: Two times a day (BID) | ORAL | Status: DC

## 2020-04-06 MED ORDER — POTASSIUM CHLORIDE CRYS ER 20 MEQ PO TBCR
40.0000 meq | EXTENDED_RELEASE_TABLET | Freq: Once | ORAL | Status: AC
  Administered 2020-04-06: 40 meq via ORAL
  Filled 2020-04-06: qty 2

## 2020-04-06 MED ORDER — CEPHALEXIN 250 MG PO CAPS: 250.0000 mg | ORAL_CAPSULE | Freq: Two times a day (BID) | ORAL | Status: DC

## 2020-04-06 MED ORDER — BOOST PLUS PO LIQD: 237.0000 mL | Freq: Two times a day (BID) | ORAL | 0 refills | Status: AC

## 2020-04-06 MED ORDER — MORPHINE SULFATE (CONCENTRATE) 10 MG/0.5ML PO SOLN
5.0000 mg | ORAL | Status: DC | PRN
  Administered 2020-04-07: 5 mg via ORAL
  Filled 2020-04-06: qty 0.5

## 2020-04-06 MED ORDER — SULFAMETHOXAZOLE-TRIMETHOPRIM 800-160 MG PO TABS: 1.0000 | ORAL_TABLET | Freq: Two times a day (BID) | ORAL | 0 refills | Status: DC

## 2020-04-06 MED ORDER — SODIUM CHLORIDE 0.9 % IV SOLN
2.0000 g | INTRAVENOUS | Status: DC
  Administered 2020-04-06 – 2020-04-12 (×7): 2 g via INTRAVENOUS
  Filled 2020-04-06 (×7): qty 2

## 2020-04-06 NOTE — Progress Notes (Addendum)
PHARMACY NOTE:  ANTIMICROBIAL RENAL DOSAGE ADJUSTMENT  Current antimicrobial regimen includes a mismatch between antimicrobial dosage and estimated renal function.  As per policy approved by the Pharmacy & Therapeutics and Medical Executive Committees, the antimicrobial dosage will be adjusted accordingly.  Current antimicrobial dosage:  Ordered Bactrim DS bid   Indication: UTI  Renal Function:   Estimated Creatinine Clearance: 15.8 mL/min (A) (by C-G formula based on SCr of 1.73 mg/dL (H)). []      On intermittent HD, scheduled: []      On CRRT    Antimicrobial dosage has been changed to:  Bactrim DS x 1 tablet, followed by Bactrim SS tablet bid for clearance 15-30 ml/min   Additional Comments: Patient discharged while processing Bactrim order, antibiotics changed to Keflex, dose appropriate for renal function   Thank you for allowing pharmacy to be a part of this patient's care.  Minda Ditto PharmD 04/06/2020 5:32 PM

## 2020-04-06 NOTE — Discharge Summary (Addendum)
Physician Discharge Summary  [name] MWU:[id number] DOB: Jul 02, 1928 DOA: 04/02/2020  PCP: Javier Glazier, MD  Admit date: 04/02/2020 Discharge date: 04/06/2020  Admitted From: Home/ ALF   Disposition:  SNF   Recommendations for Outpatient Follow-up and new medication changes:  1. Follow up with Dr. Antony Salmon in 7 days.  2. Metoprolol has been increased to 25 mg po bid 3. Holding on anticoagulation due to risk of bleeding 4. Patient had Iv iron x2, holding now on oral iron supplementation.  5. Pending abdominal MRI report.  6. Continue with cephalexin for 5 more days.   Home Health: no   Equipment/Devices: stable    Discharge Condition: full CODE STATUS: dnr   Diet recommendation: heart healthy   Brief/Interim Summary: Glenda King was admitted to the hospital with the working diagnosis of new onset atrial fibrillation.  86 year old female with past medical history for pancreatic cancer, seizures, hypertensionandhypothyroidism. Reported 3 days of fatigue, generalized weakness, lightheadedness and dizziness. Intermittent palpitations. On the day of admission she was seen as a follow-up at her oncologist office, she was noted to be tachycardic, EKG showed atrial fibrillation with heart rate of 120s to 130s. She was referred to the hospital for further evaluation. On her initial physical examination blood pressure 96/57, heart rate 118, respiratory rate 18, temperature 98.8 F, oxygen saturation 95%. She had dry mucous membranes, lungs clear to auscultation bilaterally, heart S1-S2, no gallops or rubs, abdomen soft, no lower extremity edema.  Sodium 133, potassium 4.5, chloride 99, bicarb 22, glucose 242, BUN 43, creatinine 1.8, white count 22.2, hemoglobin 11.0, hematocrit 33.6, platelets 172. SARS COVID-19 negative. Urinalysis 21-50 white cells, 11-20 red cells, specific gravity 1.015 Chest radiograph with hyperinflation, increased lung markings bilaterally,  no infiltrates. Abdominal film no bowel obstruction pattern.  EKG #1143 bpm, normal axis, right bundle branch block, atrial fibrillation rhythm, no ST segment or T wave changes. #2 116 atrial fibrillation rhythm. #3 96 bpm sinus rhythm.  Patient placed on diltiazem infusion for rate control and anticoagulation with IV heparin.  Paroxysmal atrial fibrillation, transition to oral AV blockade and oral anticoagulation with good toleration.   Heart rate continue to be well controlled with metoprolol. Because high bleeding risk anticoagulation has been discontinued. Patient received PRBC transfusion and IV iron during her hospitalization.  Abdominal MRI will be done before her discharge.   1.  New onset paroxysmal atrial fibrillation.  Patient has returned to sinus rhythm, rate has remained well controlled with AV blockade with metoprolol.  Further work-up with echocardiography showed preserved LV and RV systolic function.  She had a small posterior to the LV pericardial effusion with no tamponade physiology.  She had mild troponin elevation related to tachyarrhythmia.  Patient has a chads vas score of 4 initially placed on heparin drip and transition to apixaban.  Per recommendations from Dr. Marin Olp, patient with high risk of bleeding, this has been discontinued.  2.  Urinary tract infection, no sepsis.  Patient received 3 days therapy with ceftriaxone with good toleration.  Blood cultures no growth, urine culture contaminated. Initial white cell count is 20.8, dropped to 16.4, discharge is 19.5, follow-up as an outpatient.  Continue with cephalexin for 5 more days.   3.  Chronic kidney disease stage IV, hypomagnesemia, hypokalemia patient received supportive medical therapy, continue with oral bicarbonate. At discharge sodium 136, potassium 3.3, chloride 105, bicarb 20, glucose 110, BUN 53, creatinine 1.73.  4.  Pancreatic cancer, localized, chronic anemia of malignancy, anemia of  iron deficiency.  Patient follows with Dr. Marin Olp.  She had radiosurgery 01/2020.  Her iron panel shows severe iron deficiency, iron 15, TIBC 221, transferrin saturation 7, ferritin 196. Patient received IV iron and 2 units packed red blood cells with good toleration.  Her hemoglobin discharge is 11.2, hematocrit 33.4.  Added prophylactic pantoprazole.   Physical therapy has recommended continue therapy at SNF.   5. Seizures. Continue to be well controlled with Keppra.   6. Severe protein calorie protein malnutrition. Continue with nutritional supplements.   Discharge Diagnoses:  Principal Problem:   New onset a-fib [facility]) Active Problems:   Malignant neoplasm of bladder, unspecified (HCC)   Hypothyroidism   Chronic kidney disease (CKD) stage G4/A1, severely decreased glomerular filtration rate (GFR) between 15-29 mL/min/1.73 square meter and albuminuria creatinine ratio less than 30 mg/g (HCC)   History of seizure   Anemia in chronic renal disease   History of urinary diversion procedure   Primary pancreatic cancer ([place])   Acute lower UTI   Elevated troponin   Protein-calorie malnutrition, severe    Discharge Instructions   Allergies as of 04/06/2020      Reactions   Gadolinium Derivatives Other (See Comments)   Secondary to her stage IV kidney disease   Amoxicillin Other (See Comments)   UNKNOWN REACTION   Pollen Extract Other (See Comments)   Sneezing      Medication List    STOP taking these medications   ferrous sulfate 325 (65 FE) MG tablet     TAKE these medications   cephALEXin 250 MG capsule Commonly known as: KEFLEX Take 1 capsule (250 mg total) by mouth every 12 (twelve) hours for 5 days.   cholecalciferol 25 MCG (1000 UNIT) tablet Commonly known as: VITAMIN D3 Take 1,000 Units by mouth 2 (two) times daily.   cyanocobalamin 1000 MCG tablet Take 1 tablet (1,000 mcg total) by mouth daily.   lactose free nutrition Liqd Take 237 mLs by mouth 2 (two)  times daily between meals.   levETIRAcetam 500 MG tablet Commonly known as: KEPPRA Take 500 mg by mouth 2 (two) times daily.   levothyroxine 100 MCG tablet Commonly known as: SYNTHROID Take 100 mcg by mouth daily.   Melatonin 10 MG Caps Take 10 mg by mouth at bedtime.   metoprolol tartrate 25 MG tablet Commonly known as: LOPRESSOR Take 1 tablet (25 mg total) by mouth 2 (two) times daily. What changed:   how much to take  when to take this   pantoprazole 40 MG tablet Commonly known as: PROTONIX Take 1 tablet (40 mg total) by mouth daily. Start taking on: April 07, 2020   sodium bicarbonate 650 MG tablet Take 650 mg by mouth 2 (two) times daily.       Allergies  Allergen Reactions  . Gadolinium Derivatives Other (See Comments)    Secondary to her stage IV kidney disease   . Amoxicillin Other (See Comments)    UNKNOWN REACTION     . Pollen Extract Other (See Comments)    Sneezing    Consultations:  Oncology    Procedures/Studies: DG Abd 1 View  Result Date: 04/02/2020 CLINICAL DATA:  Abdominal distension. EXAM: ABDOMEN - 1 VIEW COMPARISON:  None. FINDINGS: The bowel gas pattern is normal. A radiopaque biliary stent is seen overlying the right upper quadrant. No radio-opaque calculi or other significant radiographic abnormality are seen. Numerous radiopaque surgical clips are seen overlying the pelvis and right lower quadrant. IMPRESSION: 1. Postoperative changes without  evidence of bowel obstruction. Electronically Signed   By: Virgina Norfolk M.D.   On: 04/02/2020 22:40   DG Chest Portable 1 View  Result Date: 04/02/2020 CLINICAL DATA:  Atrial fibrillation. EXAM: PORTABLE CHEST 1 VIEW COMPARISON:  September 03, 2019. FINDINGS: The heart size and mediastinal contours are within normal limits. Both lungs are clear. No pneumothorax or pleural effusion is noted. The visualized skeletal structures are unremarkable. IMPRESSION: No active disease. Electronically  Signed   By: Marijo Conception M.D.   On: 04/02/2020 13:00   ECHOCARDIOGRAM COMPLETE  Result Date: 04/03/2020    ECHOCARDIOGRAM REPORT   Patient Name:   Glenda King Date of Exam: 04/03/2020 Medical Rec #:  [id number]            Height:       68.4 in Accession #:    [id number]           Weight:       97.9 lb Date of Birth:  02-27-1929           BSA:          1.515 m Patient Age:    86 years             BP:           119/59 mmHg Patient Gender: F                    HR:           78 bpm. Exam Location:  Inpatient Procedure: 2D Echo, Color Doppler and Cardiac Doppler Indications:    I48.91* Unspecified atrial fibrillation  History:        Patient has no prior history of Echocardiogram examinations.                 Arrythmias:Atrial Fibrillation; Risk Factors:Cancer.  Sonographer:    Raquel Sarna Senior RDCS Referring Phys: [place]  Sonographer Comments: Technically difficult due to thin body habitus. IMPRESSIONS  1. Left ventricular ejection fraction, by estimation, is 60 to 65%. The left ventricle has normal function. The left ventricle has no regional wall motion abnormalities. Left ventricular diastolic parameters were normal.  2. Right ventricular systolic function is normal. The right ventricular size is normal. There is normal pulmonary artery systolic pressure. The estimated right ventricular systolic pressure is 42.5 mmHg.  3. A small pericardial effusion is present. The pericardial effusion is posterior to the left ventricle. There is no evidence of cardiac tamponade.  4. The mitral valve is grossly normal. Trivial mitral valve regurgitation. No evidence of mitral stenosis.  5. The aortic valve is tricuspid. There is mild thickening of the aortic valve. Aortic valve regurgitation is not visualized. No aortic stenosis is present.  6. The inferior vena cava is normal in size with greater than 50% respiratory variability, suggesting right atrial pressure of 3 mmHg. FINDINGS  Left Ventricle:  Left ventricular ejection fraction, by estimation, is 60 to 65%. The left ventricle has normal function. The left ventricle has no regional wall motion abnormalities. The left ventricular internal cavity size was normal in size. There is  no left ventricular hypertrophy. Left ventricular diastolic parameters were normal. Right Ventricle: The right ventricular size is normal. No increase in right ventricular wall thickness. Right ventricular systolic function is normal. There is normal pulmonary artery systolic pressure. The tricuspid regurgitant velocity is 2.19 m/s, and  with an assumed right atrial pressure of 3 mmHg, the estimated right ventricular systolic  pressure is 22.2 mmHg. Left Atrium: Left atrial size was normal in size. Right Atrium: Right atrial size was normal in size. Pericardium: A small pericardial effusion is present. The pericardial effusion is posterior to the left ventricle. There is no evidence of cardiac tamponade. Presence of pericardial fat pad. Mitral Valve: The mitral valve is grossly normal. Trivial mitral valve regurgitation. No evidence of mitral valve stenosis. Tricuspid Valve: The tricuspid valve is grossly normal. Tricuspid valve regurgitation is trivial. No evidence of tricuspid stenosis. Aortic Valve: The aortic valve is tricuspid. There is mild thickening of the aortic valve. Aortic valve regurgitation is not visualized. No aortic stenosis is present. Pulmonic Valve: The pulmonic valve was grossly normal. Pulmonic valve regurgitation is trivial. No evidence of pulmonic stenosis. Aorta: The aortic root and ascending aorta are structurally normal, with no evidence of dilitation. Venous: The inferior vena cava is normal in size with greater than 50% respiratory variability, suggesting right atrial pressure of 3 mmHg. IAS/Shunts: The atrial septum is grossly normal.  LEFT VENTRICLE PLAX 2D LVIDd:         3.70 cm  Diastology LVIDs:         2.10 cm  LV e' medial:    9.36 cm/s LV PW:          1.00 cm  LV E/e' medial:  10.4 LV IVS:        0.80 cm  LV e' lateral:   8.59 cm/s LVOT diam:     1.70 cm  LV E/e' lateral: 11.3 LV SV:         39 LV SV Index:   26 LVOT Area:     2.27 cm  LEFT ATRIUM             Index       RIGHT ATRIUM           Index LA diam:        2.80 cm 1.85 cm/m  RA Area:     10.60 cm LA Vol (A2C):   50.8 ml 33.54 ml/m RA Volume:   16.10 ml  10.63 ml/m LA Vol (A4C):   32.7 ml 21.59 ml/m LA Biplane Vol: 40.5 ml 26.74 ml/m  AORTIC VALVE LVOT Vmax:   93.30 cm/s LVOT Vmean:  68.800 cm/s LVOT VTI:    0.174 m  AORTA Ao Root diam: 2.90 cm Ao Asc diam:  3.10 cm MITRAL VALVE               TRICUSPID VALVE MV Area (PHT): 3.60 cm    TR Peak grad:   19.2 mmHg MV Decel Time: 211 msec    TR Vmax:        219.00 cm/s MV E velocity: 97.30 cm/s MV A velocity: 67.70 cm/s  SHUNTS MV E/A ratio:  1.44        Systemic VTI:  0.17 m                            Systemic Diam: 1.70 cm Eleonore Chiquito MD Electronically signed by Eleonore Chiquito MD Signature Date/Time: 04/03/2020/12:31:01 PM    Final        Subjective: Patient is feeling better, no nausea or vomiting, no palpitations or dyspnea.   Discharge Exam: Vitals:   04/05/20 2143 04/06/20 0554  BP: (!) 105/56 (!) 124/57  Pulse: 79 84  Resp: 18 (!) 22  Temp: 99.2 F (37.3 C) 100 F (37.8 C)  SpO2:  93% 93%   Vitals:   04/05/20 1631 04/05/20 1915 04/05/20 2143 04/06/20 0554  BP: (!) 141/65 (!) 128/53 (!) 105/56 (!) 124/57  Pulse: 89 96 79 84  Resp: 18 18 18  (!) 22  Temp: 99.6 F (37.6 C) 99.7 F (37.6 C) 99.2 F (37.3 C) 100 F (37.8 C)  TempSrc: Oral Oral    SpO2: 97% 93% 93% 93%  Weight:      Height:        General: Not in pain or dyspnea., deconditioned Neurology: Awake and alert, non focal  E ENT: mild pallor, no icterus, oral mucosa moist Cardiovascular: No JVD. S1-S2 present, rhythmic, no gallops, rubs, or murmurs. No lower extremity edema. Pulmonary: positive breath sounds bilaterally, with, no wheezing, rhonchi or  rales. Gastrointestinal. Abdomen soft and non tender Skin. No rashes Musculoskeletal: no joint deformities   The results of significant diagnostics from this hospitalization (including imaging, microbiology, ancillary and laboratory) are listed below for reference.     Microbiology: Recent Results (from the past 240 hour(s))  Culture, blood (single)     Status: None (Preliminary result)   Collection Time: 04/02/20  1:05 PM   Specimen: BLOOD LEFT ARM  Result Value Ref Range Status   Specimen Description   Final    BLOOD LEFT ARM Performed at [facility], [address] [place], [place] [address]    Special Requests   Final    BOTTLES DRAWN AEROBIC AND ANAEROBIC Blood Culture adequate volume Performed at [facility], [address] [place], [place] [address]    Culture   Final    NO GROWTH 4 DAYS Performed at [facility], [place] [address], [place], [place] [address]    Report Status PENDING  Incomplete  Urine culture     Status: Abnormal   Collection Time: 04/02/20  1:08 PM   Specimen: Urine, Random  Result Value Ref Range Status   Specimen Description   Final    URINE, RANDOM Performed at [facility], [address] [place],  [address]    Special Requests   Final    NONE Performed at [facility], [address] [place], [place] [address]    Culture MULTIPLE SPECIES PRESENT, SUGGEST RECOLLECTION (A)  Final   Report Status 04/03/2020 FINAL  Final  Resp Panel by RT PCR (RSV, Flu A&B, Covid) - Nasopharyngeal Swab     Status: None   Collection Time: 04/02/20  1:08 PM   Specimen: Nasopharyngeal Swab  Result Value Ref Range Status   SARS Coronavirus 2 by RT PCR NEGATIVE NEGATIVE Final    Comment: (NOTE) SARS-CoV-2 target nucleic acids are NOT DETECTED.  The SARS-CoV-2 RNA is generally detectable in upper respiratoy specimens during the acute phase of infection. The lowest concentration of SARS-CoV-2 viral  copies this assay can detect is 131 copies/mL. A negative result does not preclude SARS-Cov-2 infection and should not be used as the sole basis for treatment or other patient management decisions. A negative result may occur with  improper specimen collection/handling, submission of specimen other than nasopharyngeal swab, presence of viral mutation(s) within the areas targeted by this assay, and inadequate number of viral copies (<131 copies/mL). A negative result must be combined with clinical observations, patient history, and epidemiological information. The expected result is Negative.  Fact Sheet for Patients:  PinkCheek.be  Fact Sheet for Healthcare Providers:  GravelBags.it  This test is no t  yet approved or cleared by the [place] and  has been authorized for detection and/or diagnosis of SARS-CoV-2 by FDA under an Emergency Use Authorization (EUA). This EUA will remain  in effect (meaning this test can be used) for the duration of the COVID-19 declaration under Section 564(b)(1) of the Act, 21 U.S.C. section 360bbb-3(b)(1), unless the authorization is terminated or revoked sooner.     Influenza A by PCR NEGATIVE NEGATIVE Final   Influenza B by PCR NEGATIVE NEGATIVE Final    Comment: (NOTE) The Xpert Xpress SARS-CoV-2/FLU/RSV assay is intended as an aid in  the diagnosis of influenza from Nasopharyngeal swab specimens and  should not be used as a sole basis for treatment. Nasal washings and  aspirates are unacceptable for Xpert Xpress SARS-CoV-2/FLU/RSV  testing.  Fact Sheet for Patients: PinkCheek.be  Fact Sheet for Healthcare Providers: GravelBags.it  This test is not yet approved or cleared by the [place] FDA and  has been authorized for detection and/or diagnosis of SARS-CoV-2 by  FDA under an Emergency Use Authorization (EUA). This  EUA will remain  in effect (meaning this test can be used) for the duration of the  Covid-19 declaration under Section 564(b)(1) of the Act, 21  U.S.C. section 360bbb-3(b)(1), unless the authorization is  terminated or revoked.    Respiratory Syncytial Virus by PCR NEGATIVE NEGATIVE Final    Comment: (NOTE) Fact Sheet for Patients: PinkCheek.be  Fact Sheet for Healthcare Providers: GravelBags.it  This test is not yet approved or cleared by the [place] FDA and  has been authorized for detection and/or diagnosis of SARS-CoV-2 by  FDA under an Emergency Use Authorization (EUA). This EUA will remain  in effect (meaning this test can be used) for the duration of the  COVID-19 declaration under Section 564(b)(1) of the Act, 21 U.S.C.  section 360bbb-3(b)(1), unless the authorization is terminated or  revoked. Performed at [facility], [address] [place], [place] [address]   Culture, blood (x 2)     Status: None (Preliminary result)   Collection Time: 04/02/20 10:05 PM   Specimen: BLOOD  Result Value Ref Range Status   Specimen Description   Final    BLOOD LEFT HAND Performed at [facility] [address] [place], [place] [address]    Special Requests   Final    BOTTLES DRAWN AEROBIC AND ANAEROBIC Blood Culture adequate volume Performed at [facility] [address] [place], [place] [address]    Culture   Final    NO GROWTH 3 DAYS Performed at [facility], [place] [address] [place], [place] [address]    Report Status PENDING  Incomplete  Culture, blood (x 2)     Status: None (Preliminary result)   Collection Time: 04/02/20 10:05 PM   Specimen: BLOOD  Result Value Ref Range Status   Specimen Description   Final    BLOOD RIGHT HAND Performed at [facility] [address], [place], [place] [address]    Special Requests   Final     BOTTLES DRAWN AEROBIC AND ANAEROBIC Blood Culture adequate volume Performed at [facility] [address], [place], [place] [address]    Culture   Final    NO GROWTH 3 DAYS Performed at [facility], [place] [address] [place], [place] [address]    Report Status PENDING  Incomplete     Labs: BNP (last 3 results) Recent  Labs    04/02/20 2205  BNP 295.2*   Basic Metabolic Panel: Recent Labs  Lab 04/02/20 1042 04/02/20 1245 04/02/20 1308 04/03/20 0024 04/04/20 0515 04/06/20 0427  NA 133* 135  --  137 135 136  K 4.5 4.1  --  3.8 3.9 3.3*  CL 99 99  --  105 106 105  CO2 22  --   --  20* 20* 20*  GLUCOSE 242* 194*  --  116* 105* 110*  BUN 43* 41*  --  41* 42* 53*  CREATININE 1.88* 2.00*  --  1.62* 1.75* 1.73*  CALCIUM 9.7  --   --  8.2* 8.2* 8.2*  MG  --   --  1.7 1.8  --   --   PHOS  --   --   --  3.3  --   --    Liver Function Tests: Recent Labs  Lab 04/02/20 1042 04/03/20 0024  AST 47* 32  ALT 121* 86*  ALKPHOS 270* 206*  BILITOT 3.4* 2.7*  PROT 7.8 6.3*  ALBUMIN 3.5 2.7*   Recent Labs  Lab 04/02/20 1145  LIPASE 44  AMYLASE 57   No results for input(s): AMMONIA in the last 168 hours. CBC: Recent Labs  Lab 04/02/20 1042 04/02/20 1042 04/02/20 1245 04/02/20 1345 04/03/20 0024 04/04/20 0515 04/06/20 0427  WBC 22.2*  --   --  20.8* 17.5* 16.4* 19.5*  NEUTROABS 20.1*  --   --  19.0* 15.9* 15.0* 17.8*  HGB 11.0*  --  10.9* 10.4* 9.0* 8.1* 11.2*  HCT 33.6*   < > 32.0* 31.9* 27.0* 25.6* 33.4*  MCV 97.7  --   --  97.9 97.1 101.2* 96.0  PLT 172  --   --  159 132* 136* 131*   < > = values in this interval not displayed.   Cardiac Enzymes: No results for input(s): CKTOTAL, CKMB, CKMBINDEX, TROPONINI in the last 168 hours. BNP: Invalid input(s): POCBNP CBG: Recent Labs  Lab 04/03/20 1204 04/03/20 1712  GLUCAP 136* 132*   D-Dimer No results for input(s): DDIMER in the last 72 hours. Hgb A1c No results for input(s): HGBA1C  in the last 72 hours. Lipid Profile No results for input(s): CHOL, HDL, LDLCALC, TRIG, CHOLHDL, LDLDIRECT in the last 72 hours. Thyroid function studies No results for input(s): TSH, T4TOTAL, T3FREE, THYROIDAB in the last 72 hours.  Invalid input(s): FREET3 Anemia work up No results for input(s): VITAMINB12, FOLATE, FERRITIN, TIBC, IRON, RETICCTPCT in the last 72 hours. Urinalysis    Component Value Date/Time   COLORURINE YELLOW 04/02/2020 1308   APPEARANCEUR CLOUDY (A) 04/02/2020 1308   LABSPEC 1.015 04/02/2020 1308   PHURINE 7.5 04/02/2020 1308   GLUCOSEU NEGATIVE 04/02/2020 1308   HGBUR MODERATE (A) 04/02/2020 1308   BILIRUBINUR SMALL (A) 04/02/2020 1308   KETONESUR NEGATIVE 04/02/2020 1308   PROTEINUR 100 (A) 04/02/2020 1308   NITRITE NEGATIVE 04/02/2020 1308   LEUKOCYTESUR MODERATE (A) 04/02/2020 1308   Sepsis Labs Invalid input(s): PROCALCITONIN,  WBC,  LACTICIDVEN Microbiology Recent Results (from the past 240 hour(s))  Culture, blood (single)     Status: None (Preliminary result)   Collection Time: 04/02/20  1:05 PM   Specimen: BLOOD LEFT ARM  Result Value Ref Range Status   Specimen Description   Final    BLOOD LEFT ARM Performed at [facility], [address] [place], [place] [address]    Special Requests   Final    BOTTLES DRAWN  AEROBIC AND ANAEROBIC Blood Culture adequate volume Performed at [facility], [address] [place], [place] [address]    Culture   Final    NO GROWTH 4 DAYS Performed at [facility], [place] [address] [place], [place] [address]    Report Status PENDING  Incomplete  Urine culture     Status: Abnormal   Collection Time: 04/02/20  1:08 PM   Specimen: Urine, Random  Result Value Ref Range Status   Specimen Description   Final    URINE, RANDOM Performed at [facility], [address] [place],  [address]    Special Requests   Final    NONE Performed at [facility], [address] [place], [place] [address]    Culture MULTIPLE SPECIES PRESENT, SUGGEST RECOLLECTION (A)  Final   Report Status 04/03/2020 FINAL  Final  Resp Panel by RT PCR (RSV, Flu A&B, Covid) - Nasopharyngeal Swab     Status: None   Collection Time: 04/02/20  1:08 PM   Specimen: Nasopharyngeal Swab  Result Value Ref Range Status   SARS Coronavirus 2 by RT PCR NEGATIVE NEGATIVE Final    Comment: (NOTE) SARS-CoV-2 target nucleic acids are NOT DETECTED.  The SARS-CoV-2 RNA is generally detectable in upper respiratoy specimens during the acute phase of infection. The lowest concentration of SARS-CoV-2 viral copies this assay can detect is 131 copies/mL. A negative result does not preclude SARS-Cov-2 infection and should not be used as the sole basis for treatment or other patient management decisions. A negative result may occur with  improper specimen collection/handling, submission of specimen other than nasopharyngeal swab, presence of viral mutation(s) within the areas targeted by this assay, and inadequate number of viral copies (<131 copies/mL). A negative result must be combined with clinical observations, patient history, and epidemiological information. The expected result is Negative.  Fact Sheet for Patients:  PinkCheek.be  Fact Sheet for Healthcare Providers:  GravelBags.it  This test is no t yet approved or cleared by the [place] FDA and  has been authorized for detection and/or diagnosis of SARS-CoV-2 by FDA under an Emergency Use Authorization (EUA). This EUA will remain  in effect (meaning this test can be used) for the duration of the COVID-19 declaration under Section 564(b)(1) of the Act, 21 U.S.C. section 360bbb-3(b)(1), unless the authorization is terminated or revoked sooner.     Influenza A by PCR NEGATIVE NEGATIVE Final   Influenza B by PCR NEGATIVE NEGATIVE Final    Comment: (NOTE) The  Xpert Xpress SARS-CoV-2/FLU/RSV assay is intended as an aid in  the diagnosis of influenza from Nasopharyngeal swab specimens and  should not be used as a sole basis for treatment. Nasal washings and  aspirates are unacceptable for Xpert Xpress SARS-CoV-2/FLU/RSV  testing.  Fact Sheet for Patients: PinkCheek.be  Fact Sheet for Healthcare Providers: GravelBags.it  This test is not yet approved or cleared by the [place] FDA and  has been authorized for detection and/or diagnosis of SARS-CoV-2 by  FDA under an Emergency Use Authorization (EUA). This EUA will remain  in effect (meaning this test can be used) for the duration of the  Covid-19 declaration under Section 564(b)(1) of the Act, 21  U.S.C. section 360bbb-3(b)(1), unless the authorization is  terminated or revoked.    Respiratory Syncytial Virus by PCR NEGATIVE NEGATIVE Final    Comment: (NOTE) Fact Sheet for Patients: PinkCheek.be  Fact Sheet for Healthcare Providers:  GravelBags.it  This test is not yet approved or cleared by the [place] and  has been authorized for detection and/or diagnosis of SARS-CoV-2 by  FDA under an Emergency Use Authorization (EUA). This EUA will remain  in effect (meaning this test can be used) for the duration of the  COVID-19 declaration under Section 564(b)(1) of the Act, 21 U.S.C.  section 360bbb-3(b)(1), unless the authorization is terminated or  revoked. Performed at [facility], [address] [place], [place] [address]   Culture, blood (x 2)     Status: None (Preliminary result)   Collection Time: 04/02/20 10:05 PM   Specimen: BLOOD  Result Value Ref Range Status   Specimen Description   Final    BLOOD LEFT HAND Performed at [facility] [address], [place], [place] [address]    Special Requests   Final    BOTTLES  DRAWN AEROBIC AND ANAEROBIC Blood Culture adequate volume Performed at [facility] [address], [place], [place] [address]    Culture   Final    NO GROWTH 3 DAYS Performed at [facility], [place] [address] [place], [place] [address]    Report Status PENDING  Incomplete  Culture, blood (x 2)     Status: None (Preliminary result)   Collection Time: 04/02/20 10:05 PM   Specimen: BLOOD  Result Value Ref Range Status   Specimen Description   Final    BLOOD RIGHT HAND Performed at [facility] [address], [place], [place] [address]    Special Requests   Final    BOTTLES DRAWN AEROBIC AND ANAEROBIC Blood Culture adequate volume Performed at [facility] [address], [place], [place] [address]    Culture   Final    NO GROWTH 3 DAYS Performed at [facility], [place] [address] [place],  [address]    Report Status PENDING  Incomplete     Time coordinating discharge: 45 minutes  SIGNED:   Tawni Millers, MD  [facility] 04/06/2020, 9:40 AM

## 2020-04-06 NOTE — Progress Notes (Signed)
MRI report with numerous gallstones in the gallbladder with severe thickening and inflammatory changes about the gallbladder wall, appeared to be multiple ulcerations of the gallbladder wall.  Findings highly concerning for acute cholecystitis.  It appears to be a gallstone near the gallbladder neck which might be obstructing.  Intra and extrahepatic duct dilatation with abrupt obstruction of the central common bile duct in the pancreatic head.  Cancel discharge, resume IV antibiotic with intravenous ceftriaxone and add metronidazole, consult gastroenterology, she may need a ERCP.  Clear liquid diet for now n.p.o. after midnight.

## 2020-04-06 NOTE — Progress Notes (Signed)
Occupational Therapy Treatment Patient Details Name: Glenda King MRN: [id number] DOB: June 04, 1929 Today's Date: 04/06/2020    History of present illness 86 y.o. female with medical history significant of  Pancreatic Ca seizure episode, HTN, hypothyroidism, sp cystectomy now self catheterizes 3-4 times a day. Pt admitted with fatigue, dizziness, a fall. Dx of new afib, sepsis, UTI.   OT comments  Patient reports not being out of bed much past 4 days however received something for pain this morning and is agreeable to OOB activity. Patient with posterior lean, initial hand held assist to transfer to recliner chair and multimodal cues for sequencing + attempt weight shift anteriorly. Patient with limited eccentric control in to chair. After rest break implement rolling walker, patient able to ambulate ~5 ft in room with mod A due to posterior lean before stating "I'm not sure I can keep walking." Patient mod A to power up to standing from chair and transfer back to bed with rolling walker. Patient reports being set up with rehab at discharge, OT agreeable as patient is currently high fall risk if left alone with OT  D/C recommendations updated to reflect SNF.   Follow Up Recommendations  SNF    Equipment Recommendations  None recommended by OT       Precautions / Restrictions Precautions Precautions: Fall Precaution Comments: pt denies falls in past 6 months       Mobility Bed Mobility Overal bed mobility: Needs Assistance Bed Mobility: Supine to Sit;Sit to Supine     Supine to sit: Supervision Sit to supine: Min assist   General bed mobility comments: min A to lift LE onto bed  Transfers Overall transfer level: Needs assistance Equipment used: Rolling walker (2 wheeled);1 person hand held assist Transfers: Sit to/from Stand Sit to Stand: Min assist;Mod assist         General transfer comment: initial min A with multimodal cues for hand placement to stand, on 3rd  sit to stand from chair patient with increased fatigue requiring mod A to power up to standing    Balance Overall balance assessment: Needs assistance Sitting-balance support: Feet supported Sitting balance-Leahy Scale: Fair   Postural control: Posterior lean Standing balance support: Single extremity supported;Bilateral upper extremity supported Standing balance-Leahy Scale: Poor Standing balance comment: posterior lean requiring mod A                            ADL either performed or assessed with clinical judgement   ADL Overall ADL's : Needs assistance/impaired                         Toilet Transfer: Moderate assistance;Cueing for safety;Cueing for sequencing;RW;Ambulation;BSC Toilet Transfer Details (indicate cue type and reason): practice functional transfer training first with HHA then with RW. patient with posterior lean throughout functional ambulation in room, requires verbal cues for safety hand placement and sequencing with walker "I've never used one before"         Functional mobility during ADLs: Moderate assistance;Cueing for safety;Cueing for sequencing;Rolling walker General ADL Comments: patient is at increased risk of falls this session due to posterior bias in static standing and throughout functional ambulation in room with multimodal cues to try and weight shift anteriorly                Cognition Arousal/Alertness: Awake/alert Behavior During Therapy: [facility] for tasks assessed/performed Overall Cognitive Status: Within Functional Limits for tasks  assessed                                                     Pertinent Vitals/ Pain       Pain Assessment: Faces Faces Pain Scale: Hurts a little bit Pain Location: RLQ with movement Pain Descriptors / Indicators: Discomfort Pain Intervention(s): Premedicated before session         Frequency  Min 2X/week        Progress Toward Goals  OT Goals(current  goals can now be found in the care plan section)  Progress towards OT goals: Progressing toward goals  Acute Rehab OT Goals Patient Stated Goal: to go rehab OT Goal Formulation: With patient Time For Goal Achievement: 04/17/20 Potential to Achieve Goals: Good  Plan Discharge plan needs to be updated       AM-PAC OT "6 Clicks" Daily Activity     Outcome Measure   Help from another person eating meals?: None Help from another person taking care of personal grooming?: A Little Help from another person toileting, which includes using toliet, bedpan, or urinal?: A Lot Help from another person bathing (including washing, rinsing, drying)?: A Little Help from another person to put on and taking off regular upper body clothing?: A Little Help from another person to put on and taking off regular lower body clothing?: A Lot 6 Click Score: 17    End of Session Equipment Utilized During Treatment: Gait belt;Rolling walker  OT Visit Diagnosis: Unsteadiness on feet (R26.81);Muscle weakness (generalized) (M62.81);Pain Pain - Right/Left: Right Pain - part of body:  (LQ)   Activity Tolerance Patient tolerated treatment well   Patient Left in bed;with call bell/phone within reach;with bed alarm set   Nurse Communication Mobility status        Time: 7829-5621 OT Time Calculation (min): 25 min  Charges: OT General Charges $OT Visit: 1 Visit OT Treatments $Self Care/Home Management : 23-37 mins  Delbert Phenix OT OT pager: [phone]  Rosemary Holms 04/06/2020, 9:41 AM

## 2020-04-06 NOTE — NC FL2 (Signed)
[place] LEVEL OF CARE SCREENING TOOL     IDENTIFICATION  Patient Name: Glenda King Birthdate: 1928/11/16 Sex: female Admission Date (Current Location): 04/02/2020  [facility] and [place] Number:  Herbalist and Address:  [facility],  [place], [place]      Provider Number: [id number]  Attending Physician Name and Address:  Tawni Millers,*  Relative Name and Phone Number:  Joaquin Music Brother [phone]    Current Level of Care: Hospital Recommended Level of Care: [place] Prior Approval Number:    Date Approved/Denied:   PASRR Number: [id number]  Discharge Plan: SNF    Current Diagnoses: Patient Active Problem List   Diagnosis Date Noted  . Protein-calorie malnutrition, severe 04/06/2020  . New onset a-fib ([place]) 04/02/2020  . Acute lower UTI 04/02/2020  . AKI (acute kidney injury) ([place]) 04/02/2020  . Elevated troponin 04/02/2020  . Genetic testing 12/16/2019  . Primary pancreatic cancer ([place]) 12/10/2019  . Goals of care, counseling/discussion 12/10/2019  . Biliary obstruction   . Jaundice 09/04/2019  . Hyperbilirubinemia 09/04/2019  . Cholelithiases 09/03/2019  . Choledocholithiasis 09/03/2019  . Chronic kidney disease (CKD) stage G4/A1, severely decreased glomerular filtration rate (GFR) between 15-29 mL/min/1.73 square meter and albuminuria creatinine ratio less than 30 mg/g (HCC) 09/03/2019  . History of seizure 09/03/2019  . Meningioma ([place]) 05/15/2016  . Urinary retention 01/20/2016  . Left frontal lobe mass 03/24/2014  . History of bladder cancer 02/12/2014  . History of urinary diversion procedure 02/12/2014  . Hydronephrosis, bilateral 02/12/2014  . Anemia in chronic renal disease 11/17/2013  . Lack of adequate sleep 11/16/2012  . Malignant neoplasm of bladder, unspecified ([place]) 10/17/2007  . COLONIC POLYPS 10/17/2007  . Hypothyroidism 10/17/2007  .  HYPERCHOLESTEROLEMIA, BORDERLINE 10/17/2007  . RENAL INSUFFICIENCY 10/17/2007    Orientation RESPIRATION BLADDER Height & Weight     Self, Time, Situation, Place  Normal Incontinent (Self Catheter) Weight: 44.4 kg Height:  5' 8.4" (173.7 cm)  BEHAVIORAL SYMPTOMS/MOOD NEUROLOGICAL BOWEL NUTRITION STATUS    Convulsions/Seizures (History) Continent Diet (Heart Healthy)  AMBULATORY STATUS COMMUNICATION OF NEEDS Skin   Extensive Assist Verbally Normal                       Personal Care Assistance Level of Assistance  Bathing, Feeding, Dressing Bathing Assistance: Limited assistance Feeding assistance: Independent Dressing Assistance: Limited assistance     Functional Limitations Info  Sight, Hearing, Speech Sight Info: Adequate Hearing Info: Adequate Speech Info: Adequate    SPECIAL CARE FACTORS FREQUENCY  PT (By licensed PT), OT (By licensed OT)     PT Frequency: 5x week OT Frequency: 5x week            Contractures Contractures Info: Not present    Additional Factors Info  Code Status, Allergies Code Status Info: DNR Allergies Info: Gadolinium Derivatives, Amoxicillin, Pollen Extract           Current Medications (04/06/2020):  This is the current hospital active medication list Current Facility-Administered Medications  Medication Dose Route Frequency Provider Last Rate Last Admin  . acetaminophen (TYLENOL) tablet 650 mg  650 mg Oral Q6H PRN Toy Baker, MD       Or  . acetaminophen (TYLENOL) suppository 650 mg  650 mg Rectal Q6H PRN Doutova, Anastassia, MD      . influenza vaccine adjuvanted (FLUAD) injection 0.5 mL  0.5 mL Intramuscular Prior to discharge Arrien, Jimmy Picket,  MD      . lactose free nutrition (BOOST PLUS) liquid 237 mL  237 mL Oral BID BM Arrien, Jimmy Picket, MD   237 mL at 04/06/20 0920  . levETIRAcetam (KEPPRA) tablet 500 mg  500 mg Oral BID Toy Baker, MD   500 mg at 04/06/20 0921  . levothyroxine (SYNTHROID)  tablet 100 mcg  100 mcg Oral Q0600 Toy Baker, MD   100 mcg at 04/06/20 0629  . melatonin tablet 10 mg  10 mg Oral QHS Toy Baker, MD   10 mg at 04/05/20 2144  . metoprolol tartrate (LOPRESSOR) injection 5 mg  5 mg Intravenous Q4H PRN Arrien, Jimmy Picket, MD      . metoprolol tartrate (LOPRESSOR) tablet 25 mg  25 mg Oral BID Tawni Millers, MD   25 mg at 04/06/20 6644  . morphine CONCENTRATE 10 MG/0.5ML oral solution 5 mg  5 mg Oral Q2H PRN [name], MD      . pantoprazole (PROTONIX) EC tablet 40 mg  40 mg Oral Daily Arrien, Jimmy Picket, MD   40 mg at 04/06/20 0920  . potassium chloride SA (KLOR-CON) CR tablet 40 mEq  40 mEq Oral Once Arrien, Jimmy Picket, MD      . sodium bicarbonate tablet 650 mg  650 mg Oral BID Toy Baker, MD   650 mg at 04/06/20 0920     Discharge Medications: Please see discharge summary for a list of discharge medications.  Relevant Imaging Results:  Relevant Lab Results:   Additional Information IH#[id number]  Purcell Mouton, RN

## 2020-04-06 NOTE — Progress Notes (Signed)
This morning, Ms. Janek's big complaint is pain.  Is having pain over on the right side.  I am unsure what this pain is coming from.  She has not had the MRI yet.  Hopefully she will have this today.  She had a very nice response to 2 units of packed red blood cells.  Hemoglobin up to 11.2.  Her appetite is not that great.  She has had no nausea or vomiting.  There is been no issues with diarrhea.  Her white cell count is 19.5.  Her platelet count is 131,000.  Her white cells are up quite a bit.  I would have to worry about possibility of an infection.  Again she had a urinalysis which showed multiple species.  Her BUN is 53 creatinine 1.73.    I will try her on some morphine elixir.  Maybe, this will help ease some of the pain that she is having.  She has been afebrile.  Her temperature is 97.7.  Pulse 57.  Blood pressure 93/50.  On her exam, there is some tenderness over on the right side.  There is no fluid wave.  I really cannot palpate her liver edge.  Hopefully, the morphine elixir will help a little bit better.  I realize that she is a DNR.  However, we really need to try to help her quality of life.  We did give her a little more comfort.  I appreciate the outstanding care that she is getting from the staff up on [place]. MD  Jeneen Rinks 1:19

## 2020-04-06 NOTE — TOC Progression Note (Signed)
Transition of Care [facility]) - Progression Note    Patient Details  Name: Glenda King MRN: [id number] Date of Birth: 12/13/1928  [facility] CM/SW Contact  Purcell Mouton, RN Phone Number: 04/06/2020, 3:23 PM  Clinical Narrative:     Pt's brother will transport her to [facility] room 7011.   Expected Discharge Plan: Assisted Living Barriers to Discharge: No Barriers Identified  Expected Discharge Plan and Services Expected Discharge Plan: Assisted Living       Living arrangements for the past 2 months: [place] Expected Discharge Date: 04/06/20                                     Social Determinants of Health (SDOH) Interventions    Readmission Risk Interventions No flowsheet data found.

## 2020-04-07 DIAGNOSIS — K8001 Calculus of gallbladder with acute cholecystitis with obstruction: Secondary | ICD-10-CM | POA: Diagnosis not present

## 2020-04-07 DIAGNOSIS — C259 Malignant neoplasm of pancreas, unspecified: Secondary | ICD-10-CM | POA: Diagnosis not present

## 2020-04-07 DIAGNOSIS — K831 Obstruction of bile duct: Secondary | ICD-10-CM

## 2020-04-07 DIAGNOSIS — I4891 Unspecified atrial fibrillation: Secondary | ICD-10-CM | POA: Diagnosis not present

## 2020-04-07 LAB — CBC WITH DIFFERENTIAL/PLATELET
Abs Immature Granulocytes: 0.36 10*3/uL — ABNORMAL HIGH (ref 0.00–0.07)
Basophils Absolute: 0.1 10*3/uL (ref 0.0–0.1)
Basophils Relative: 0 %
Eosinophils Absolute: 0.1 10*3/uL (ref 0.0–0.5)
Eosinophils Relative: 0 %
HCT: 32 % — ABNORMAL LOW (ref 36.0–46.0)
Hemoglobin: 10.4 g/dL — ABNORMAL LOW (ref 12.0–15.0)
Immature Granulocytes: 2 %
Lymphocytes Relative: 2 %
Lymphs Abs: 0.4 10*3/uL — ABNORMAL LOW (ref 0.7–4.0)
MCH: 32.1 pg (ref 26.0–34.0)
MCHC: 32.5 g/dL (ref 30.0–36.0)
MCV: 98.8 fL (ref 80.0–100.0)
Monocytes Absolute: 0.8 10*3/uL (ref 0.1–1.0)
Monocytes Relative: 4 %
Neutro Abs: 18.2 10*3/uL — ABNORMAL HIGH (ref 1.7–7.7)
Neutrophils Relative %: 92 %
Platelets: 159 10*3/uL (ref 150–400)
RBC: 3.24 MIL/uL — ABNORMAL LOW (ref 3.87–5.11)
RDW: 13.6 % (ref 11.5–15.5)
WBC: 19.8 10*3/uL — ABNORMAL HIGH (ref 4.0–10.5)
nRBC: 0 % (ref 0.0–0.2)

## 2020-04-07 LAB — COMPREHENSIVE METABOLIC PANEL
ALT: 26 U/L (ref 0–44)
AST: 18 U/L (ref 15–41)
Albumin: 2.2 g/dL — ABNORMAL LOW (ref 3.5–5.0)
Alkaline Phosphatase: 167 U/L — ABNORMAL HIGH (ref 38–126)
Anion gap: 9 (ref 5–15)
BUN: 50 mg/dL — ABNORMAL HIGH (ref 8–23)
CO2: 18 mmol/L — ABNORMAL LOW (ref 22–32)
Calcium: 8.4 mg/dL — ABNORMAL LOW (ref 8.9–10.3)
Chloride: 107 mmol/L (ref 98–111)
Creatinine, Ser: 1.61 mg/dL — ABNORMAL HIGH (ref 0.44–1.00)
GFR, Estimated: 31 mL/min — ABNORMAL LOW (ref >60)
Glucose, Bld: 99 mg/dL (ref 70–99)
Potassium: 4.1 mmol/L (ref 3.5–5.1)
Sodium: 134 mmol/L — ABNORMAL LOW (ref 135–145)
Total Bilirubin: 1.1 mg/dL (ref 0.3–1.2)
Total Protein: 6 g/dL — ABNORMAL LOW (ref 6.5–8.1)

## 2020-04-07 LAB — CULTURE, BLOOD (SINGLE)
Culture: NO GROWTH
Special Requests: ADEQUATE

## 2020-04-07 LAB — PROTIME-INR
INR: 1.2 (ref 0.8–1.2)
Prothrombin Time: 14.4 seconds (ref 11.4–15.2)

## 2020-04-07 NOTE — Consult Note (Signed)
Chief Complaint: Patient was seen in consultation today for  Chief Complaint  Patient presents with  . Abdominal pain    Referring Physician(s): Margie Billet, PA-C  Supervising Physician: Jacqulynn Cadet  Patient Status: [facility] - In-pt  History of Present Illness: Glenda King is a 86 y.o. female with a medical history significant for localized pancreatic cancer s/p radiation treatment, seizures, HTN, and CKD. She was admitted 04/02/20 with fatigue, weakness and dizziness. She was found to be in A.Fib with RVR. She was treated with a diltiazem infusion and was prepared for discharge 04/06/20 when she developed worsening abdominal pain. MRI was obtained with results positive for numerous gallstones, severe gallbladder wall thickening with ulcerations of the gallbladder wall and a stone in the neck of the gallbladder, all consistent with cholecystitis; intra and extrahepatic ductal dilation with abrupt obstruction of the central common bile duct, common bile duct stent in place poorly imaged on this noncontrasted MRI, small stable pancreatic head mass.  Interventional Radiology has been asked to evaluate this patient for an image-guided percutaneous cholecystostomy drain. This case has been reviewed and procedure approved by Dr. Laurence Ferrari.   Past Medical History:  Diagnosis Date  . Anemia   . Cancer [facility])    bladder cancer  . Cardiomyopathy ([place])   . CKD (chronic kidney disease)   . Goals of care, counseling/discussion 12/10/2019  . Primary pancreatic cancer ([place]) 12/10/2019  . Thyroid disease    hypothyroid    Past Surgical History:  Procedure Laterality Date  . BILIARY STENT PLACEMENT N/A 09/05/2019   Procedure: BILIARY STENT PLACEMENT;  Surgeon: Irene Shipper, MD;  Location: [facility] ENDOSCOPY;  Service: Endoscopy;  Laterality: N/A;  . BIOPSY  12/03/2019   Procedure: BIOPSY;  Surgeon: Rush Landmark Telford Nab., MD;  Location: [facility] ENDOSCOPY;  Service: Gastroenterology;;  .  ERCP N/A 09/05/2019   Procedure: ENDOSCOPIC RETROGRADE CHOLANGIOPANCREATOGRAPHY (ERCP);  Surgeon: Irene Shipper, MD;  Location: [facility];  Service: Endoscopy;  Laterality: N/A;  . ESOPHAGOGASTRODUODENOSCOPY (EGD) WITH PROPOFOL N/A 12/03/2019   Procedure: ESOPHAGOGASTRODUODENOSCOPY (EGD) WITH PROPOFOL;  Surgeon: Rush Landmark Telford Nab., MD;  Location: [facility] ENDOSCOPY;  Service: Gastroenterology;  Laterality: N/A;  . EUS N/A 12/03/2019   Procedure: UPPER ENDOSCOPIC ULTRASOUND (EUS) LINEAR;  Surgeon: Irving Copas., MD;  Location: [facility]OSCOPY;  Service: Gastroenterology;  Laterality: N/A;  . FINE NEEDLE ASPIRATION N/A 12/03/2019   Procedure: FINE NEEDLE ASPIRATION (FNA) LINEAR;  Surgeon: Irving Copas., MD;  Location: [facility]OSCOPY;  Service: Gastroenterology;  Laterality: N/A;  . SPHINCTEROTOMY  09/05/2019   Procedure: SPHINCTEROTOMY;  Surgeon: Irene Shipper, MD;  Location: [facility] ENDOSCOPY;  Service: Endoscopy;;    Allergies: Gadolinium derivatives, Amoxicillin, and Pollen extract  Medications: Prior to Admission medications   Medication Sig Start Date End Date Taking? Authorizing Provider  cholecalciferol (VITAMIN D3) 25 MCG (1000 UNIT) tablet Take 1,000 Units by mouth 2 (two) times daily.   Yes [provider]  levETIRAcetam (KEPPRA) 500 MG tablet Take 500 mg by mouth 2 (two) times daily. 05/13/19  Yes [provider]  levothyroxine (SYNTHROID) 100 MCG tablet Take 100 mcg by mouth daily. 05/13/19  Yes [provider]  Melatonin 10 MG CAPS Take 10 mg by mouth at bedtime.   Yes [provider]  sodium bicarbonate 650 MG tablet Take 650 mg by mouth 2 (two) times daily. 05/27/19  Yes [provider]  vitamin B-12 1000 MCG tablet Take 1 tablet (1,000 mcg total) by mouth daily. 09/06/19  Yes Regalado, Belkys A, MD  cephALEXin (KEFLEX) 250 MG capsule Take 1 capsule (250 mg total) by mouth every 12 (twelve) hours for 5 days. 04/06/20 04/11/20   Arrien, Jimmy Picket, MD  lactose free nutrition (BOOST PLUS) LIQD Take 237 mLs by mouth 2 (two) times daily between meals. 04/06/20 05/06/20  Arrien, Jimmy Picket, MD  metoprolol tartrate (LOPRESSOR) 25 MG tablet Take 1 tablet (25 mg total) by mouth 2 (two) times daily. 04/06/20 05/06/20  Arrien, Jimmy Picket, MD  pantoprazole (PROTONIX) 40 MG tablet Take 1 tablet (40 mg total) by mouth daily. 04/07/20 05/07/20  Arrien, Jimmy Picket, MD     Family History  Problem Relation Age of Onset  . Hypertension Other     Social History   Socioeconomic History  . Marital status: Married    Spouse name: Not on file  . Number of children: Not on file  . Years of education: Not on file  . Highest education level: Not on file  Occupational History  . Not on file  Tobacco Use  . Smoking status: Never Smoker  . Smokeless tobacco: Never Used  Vaping Use  . Vaping Use: Never used  Substance and Sexual Activity  . Alcohol use: Not Currently  . Drug use: Not Currently  . Sexual activity: Not on file  Other Topics Concern  . Not on file  Social History Narrative  . Not on file   Social Determinants of Health   Financial Resource Strain:   . Difficulty of Paying Living Expenses: Not on file  Food Insecurity:   . Worried About Charity fundraiser in the Last Year: Not on file  . Ran Out of Food in the Last Year: Not on file  Transportation Needs:   . Lack of Transportation (Medical): Not on file  . Lack of Transportation (Non-Medical): Not on file  Physical Activity:   . Days of Exercise per Week: Not on file  . Minutes of Exercise per Session: Not on file  Stress:   . Feeling of Stress : Not on file  Social Connections:   . Frequency of Communication with Friends and Family: Not on file  . Frequency of Social Gatherings with Friends and Family: Not on file  . Attends Religious Services: Not on file  . Active Member of Clubs or Organizations: Not on file  . Attends Theatre manager Meetings: Not on file  . Marital Status: Not on file    Review of Systems: A 12 point ROS discussed and pertinent positives are indicated in the HPI above.  All other systems are negative.  Review of Systems  Constitutional: Positive for appetite change and fatigue.  Respiratory: Negative for cough and shortness of breath.   Cardiovascular: Negative for chest pain and leg swelling.  Gastrointestinal: Positive for abdominal pain. Negative for diarrhea, nausea and vomiting.  Musculoskeletal: Negative for back pain.  Neurological: Negative for headaches.    Vital Signs: BP (!) 95/53 (BP Location: Left Arm)   Pulse 63   Temp 98.2 F (36.8 C) (Oral)   Resp 18   Ht 5' 8.4" (1.737 m)   Wt 97 lb 14.2 oz (44.4 kg)   SpO2 97%   BMI 14.71 kg/m   Physical Exam Constitutional:      General: She is not in acute distress. HENT:     Mouth/Throat:     Mouth: Mucous membranes are moist.     Pharynx: Oropharynx is clear.  Cardiovascular:  Rate and Rhythm: Normal rate and regular rhythm.     Pulses: Normal pulses.     Heart sounds: Normal heart sounds.  Pulmonary:     Effort: Pulmonary effort is normal.     Breath sounds: Normal breath sounds.  Abdominal:     General: Bowel sounds are normal.     Palpations: Abdomen is soft.     Tenderness: There is abdominal tenderness.     Comments: RUQ pain/tenderness to palpation  Musculoskeletal:        General: Normal range of motion.  Skin:    General: Skin is warm and dry.     Coloration: Skin is not jaundiced.  Neurological:     Mental Status: She is alert and oriented to person, place, and time.     Imaging: DG Abd 1 View  Result Date: 04/02/2020 CLINICAL DATA:  Abdominal distension. EXAM: ABDOMEN - 1 VIEW COMPARISON:  None. FINDINGS: The bowel gas pattern is normal. A radiopaque biliary stent is seen overlying the right upper quadrant. No radio-opaque calculi or other significant radiographic abnormality are seen.  Numerous radiopaque surgical clips are seen overlying the pelvis and right lower quadrant. IMPRESSION: 1. Postoperative changes without evidence of bowel obstruction. Electronically Signed   By: Virgina Norfolk M.D.   On: 04/02/2020 22:40   MR ABDOMEN WO CONTRAST  Result Date: 04/06/2020 CLINICAL DATA:  Pancreatic cancer, bladder cancer, assess treatment response, EXAM: MRI ABDOMEN WITHOUT CONTRAST TECHNIQUE: Multiplanar multisequence MR imaging was performed without the administration of intravenous contrast. Patient declined contrast administration. COMPARISON:  CT abdomen pelvis, 12/10/2019, MR abdomen, 09/04/2019 FINDINGS: Examination is generally limited throughout by breath motion artifact and lack of intravenous contrast. Lower chest: No acute findings. Hepatobiliary: There is intra and extrahepatic biliary ductal dilatation, similar to prior examination, with abrupt obstruction of the central common bile duct in the pancreatic head. There appears to be significant scarring and volume loss of the left lobe of the liver. A common bile duct stent appears to be present but is poorly imaged by MR. Numerous gallstones in the gallbladder with severe thickening and inflammatory change about the gallbladder wall, with what appear to be multiple ulcerations of the gallbladder wall (series 10, image 70, 62). There appears to be a gallstone near the gallbladder neck (series 4, image 24). Pancreas: Subtly T1 and T2 hypointense, small mass of the central pancreatic head is again noted, difficult to accurately measure given lack of intravenous contrast and small size but not obviously changed compared to prior examinations (series 10, image 71). Inflammatory changes, or other parenchymal abnormality identified. Spleen:  Within normal limits in size and appearance. Adrenals/Urinary Tract: Atrophic appearance of the bilateral kidneys, with moderate bilateral hydronephrosis. Stomach/Bowel: Visualized portions within  the abdomen are unremarkable. Vascular/Lymphatic: No pathologically enlarged lymph nodes identified. No abdominal aortic aneurysm demonstrated. Other:  None. Musculoskeletal: No suspicious bone lesions identified. Pectus deformity of the included lower chest. IMPRESSION: 1. Examination is very limited by lack of intravenous contrast and breath motion artifact. 2. Numerous gallstones in the gallbladder with severe thickening and inflammatory change about the gallbladder wall, with what appear to be multiple ulcerations of the gallbladder wall. Findings are highly concerning for acute cholecystitis. There appears to be a gallstone near the gallbladder neck, which may be obstructing. 3. Subtly T1 and T2 hypointense, small mass of the central pancreatic head is again noted, difficult to accurately measure given lack of intravenous contrast and small size but not obviously changed compared to prior  examinations. This remains consistent with small pancreatic adenocarcinoma. 4. Intra and extrahepatic biliary ductal dilatation, with abrupt obstruction of the central common bile duct in the pancreatic head. A common bile duct stent appears to be present but is poorly imaged by MR. 5. Marked postobstructive scarring and volume loss of the left lobe of the liver, similar to prior examination. 6. Moderate bilateral hydronephrosis, slightly increased compared to prior examination. Distal ureters and bladder are not imaged. Consider additional imaging of the pelvis given reported history of bladder cancer. Electronically Signed   By: Eddie Candle M.D.   On: 04/06/2020 16:16   MR 3D Recon At Scanner  Result Date: 04/06/2020 CLINICAL DATA:  Pancreatic cancer, bladder cancer, assess treatment response, EXAM: MRI ABDOMEN WITHOUT CONTRAST TECHNIQUE: Multiplanar multisequence MR imaging was performed without the administration of intravenous contrast. Patient declined contrast administration. COMPARISON:  CT abdomen pelvis,  12/10/2019, MR abdomen, 09/04/2019 FINDINGS: Examination is generally limited throughout by breath motion artifact and lack of intravenous contrast. Lower chest: No acute findings. Hepatobiliary: There is intra and extrahepatic biliary ductal dilatation, similar to prior examination, with abrupt obstruction of the central common bile duct in the pancreatic head. There appears to be significant scarring and volume loss of the left lobe of the liver. A common bile duct stent appears to be present but is poorly imaged by MR. Numerous gallstones in the gallbladder with severe thickening and inflammatory change about the gallbladder wall, with what appear to be multiple ulcerations of the gallbladder wall (series 10, image 70, 62). There appears to be a gallstone near the gallbladder neck (series 4, image 24). Pancreas: Subtly T1 and T2 hypointense, small mass of the central pancreatic head is again noted, difficult to accurately measure given lack of intravenous contrast and small size but not obviously changed compared to prior examinations (series 10, image 71). Inflammatory changes, or other parenchymal abnormality identified. Spleen:  Within normal limits in size and appearance. Adrenals/Urinary Tract: Atrophic appearance of the bilateral kidneys, with moderate bilateral hydronephrosis. Stomach/Bowel: Visualized portions within the abdomen are unremarkable. Vascular/Lymphatic: No pathologically enlarged lymph nodes identified. No abdominal aortic aneurysm demonstrated. Other:  None. Musculoskeletal: No suspicious bone lesions identified. Pectus deformity of the included lower chest. IMPRESSION: 1. Examination is very limited by lack of intravenous contrast and breath motion artifact. 2. Numerous gallstones in the gallbladder with severe thickening and inflammatory change about the gallbladder wall, with what appear to be multiple ulcerations of the gallbladder wall. Findings are highly concerning for acute  cholecystitis. There appears to be a gallstone near the gallbladder neck, which may be obstructing. 3. Subtly T1 and T2 hypointense, small mass of the central pancreatic head is again noted, difficult to accurately measure given lack of intravenous contrast and small size but not obviously changed compared to prior examinations. This remains consistent with small pancreatic adenocarcinoma. 4. Intra and extrahepatic biliary ductal dilatation, with abrupt obstruction of the central common bile duct in the pancreatic head. A common bile duct stent appears to be present but is poorly imaged by MR. 5. Marked postobstructive scarring and volume loss of the left lobe of the liver, similar to prior examination. 6. Moderate bilateral hydronephrosis, slightly increased compared to prior examination. Distal ureters and bladder are not imaged. Consider additional imaging of the pelvis given reported history of bladder cancer. Electronically Signed   By: Eddie Candle M.D.   On: 04/06/2020 16:16   DG Chest Portable 1 View  Result Date: 04/02/2020 CLINICAL DATA:  Atrial fibrillation. EXAM: PORTABLE CHEST 1 VIEW COMPARISON:  September 03, 2019. FINDINGS: The heart size and mediastinal contours are within normal limits. Both lungs are clear. No pneumothorax or pleural effusion is noted. The visualized skeletal structures are unremarkable. IMPRESSION: No active disease. Electronically Signed   By: Marijo Conception M.D.   On: 04/02/2020 13:00   ECHOCARDIOGRAM COMPLETE  Result Date: 04/03/2020    ECHOCARDIOGRAM REPORT   Patient Name:   Glenda King Date of Exam: 04/03/2020 Medical Rec #:  [id number]            Height:       68.4 in Accession #:    [id number]           Weight:       97.9 lb Date of Birth:  [birth year]           BSA:          1.515 m Patient Age:    44 years             BP:           119/59 mmHg Patient Gender: F                    HR:           78 bpm. Exam Location:  Inpatient Procedure: 2D Echo, Color  Doppler and Cardiac Doppler Indications:    I48.91* Unspecified atrial fibrillation  History:        Patient has no prior history of Echocardiogram examinations.                 Arrythmias:Atrial Fibrillation; Risk Factors:Cancer.  Sonographer:    Raquel Sarna Senior RDCS Referring Phys: [place]  Sonographer Comments: Technically difficult due to thin body habitus. IMPRESSIONS  1. Left ventricular ejection fraction, by estimation, is 60 to 65%. The left ventricle has normal function. The left ventricle has no regional wall motion abnormalities. Left ventricular diastolic parameters were normal.  2. Right ventricular systolic function is normal. The right ventricular size is normal. There is normal pulmonary artery systolic pressure. The estimated right ventricular systolic pressure is 81.8 mmHg.  3. A small pericardial effusion is present. The pericardial effusion is posterior to the left ventricle. There is no evidence of cardiac tamponade.  4. The mitral valve is grossly normal. Trivial mitral valve regurgitation. No evidence of mitral stenosis.  5. The aortic valve is tricuspid. There is mild thickening of the aortic valve. Aortic valve regurgitation is not visualized. No aortic stenosis is present.  6. The inferior vena cava is normal in size with greater than 50% respiratory variability, suggesting right atrial pressure of 3 mmHg. FINDINGS  Left Ventricle: Left ventricular ejection fraction, by estimation, is 60 to 65%. The left ventricle has normal function. The left ventricle has no regional wall motion abnormalities. The left ventricular internal cavity size was normal in size. There is  no left ventricular hypertrophy. Left ventricular diastolic parameters were normal. Right Ventricle: The right ventricular size is normal. No increase in right ventricular wall thickness. Right ventricular systolic function is normal. There is normal pulmonary artery systolic pressure. The tricuspid regurgitant  velocity is 2.19 m/s, and  with an assumed right atrial pressure of 3 mmHg, the estimated right ventricular systolic pressure is 56.3 mmHg. Left Atrium: Left atrial size was normal in size. Right Atrium: Right atrial size was normal in size. Pericardium: A small pericardial effusion is present. The pericardial effusion  is posterior to the left ventricle. There is no evidence of cardiac tamponade. Presence of pericardial fat pad. Mitral Valve: The mitral valve is grossly normal. Trivial mitral valve regurgitation. No evidence of mitral valve stenosis. Tricuspid Valve: The tricuspid valve is grossly normal. Tricuspid valve regurgitation is trivial. No evidence of tricuspid stenosis. Aortic Valve: The aortic valve is tricuspid. There is mild thickening of the aortic valve. Aortic valve regurgitation is not visualized. No aortic stenosis is present. Pulmonic Valve: The pulmonic valve was grossly normal. Pulmonic valve regurgitation is trivial. No evidence of pulmonic stenosis. Aorta: The aortic root and ascending aorta are structurally normal, with no evidence of dilitation. Venous: The inferior vena cava is normal in size with greater than 50% respiratory variability, suggesting right atrial pressure of 3 mmHg. IAS/Shunts: The atrial septum is grossly normal.  LEFT VENTRICLE PLAX 2D LVIDd:         3.70 cm  Diastology LVIDs:         2.10 cm  LV e' medial:    9.36 cm/s LV PW:         1.00 cm  LV E/e' medial:  10.4 LV IVS:        0.80 cm  LV e' lateral:   8.59 cm/s LVOT diam:     1.70 cm  LV E/e' lateral: 11.3 LV SV:         39 LV SV Index:   26 LVOT Area:     2.27 cm  LEFT ATRIUM             Index       RIGHT ATRIUM           Index LA diam:        2.80 cm 1.85 cm/m  RA Area:     10.60 cm LA Vol (A2C):   50.8 ml 33.54 ml/m RA Volume:   16.10 ml  10.63 ml/m LA Vol (A4C):   32.7 ml 21.59 ml/m LA Biplane Vol: 40.5 ml 26.74 ml/m  AORTIC VALVE LVOT Vmax:   93.30 cm/s LVOT Vmean:  68.800 cm/s LVOT VTI:    0.174 m  AORTA  Ao Root diam: 2.90 cm Ao Asc diam:  3.10 cm MITRAL VALVE               TRICUSPID VALVE MV Area (PHT): 3.60 cm    TR Peak grad:   19.2 mmHg MV Decel Time: 211 msec    TR Vmax:        219.00 cm/s MV E velocity: 97.30 cm/s MV A velocity: 67.70 cm/s  SHUNTS MV E/A ratio:  1.44        Systemic VTI:  0.17 m                            Systemic Diam: 1.70 cm Eleonore Chiquito MD Electronically signed by Eleonore Chiquito MD Signature Date/Time: 04/03/2020/12:31:01 PM    Final     Labs:  CBC: Recent Labs    04/03/20 0024 04/04/20 0515 04/06/20 0427 04/07/20 0406  WBC 17.5* 16.4* 19.5* 19.8*  HGB 9.0* 8.1* 11.2* 10.4*  HCT 27.0* 25.6* 33.4* 32.0*  PLT 132* 136* 131* 159    COAGS: Recent Labs    09/04/19 0753 04/02/20 1308 04/07/20 1154  INR 1.1 1.2 1.2  APTT  --  39*  --     BMP: Recent Labs    12/10/19 1216 12/10/19 1216 01/19/20 1356 01/19/20 1356  02/27/20 1301 02/27/20 1301 03/12/20 1109 04/02/20 1042 04/03/20 0024 04/04/20 0515 04/06/20 0427 04/07/20 0406  NA 140   < > 140   < > 137   < > 138   < > 137 135 136 134*  K 4.8   < > 5.1   < > 5.1   < > 4.8   < > 3.8 3.9 3.3* 4.1  CL 100   < > 105   < > 102   < > 103   < > 105 106 105 107  CO2 29   < > 28   < > 28   < > 29   < > 20* 20* 20* 18*  GLUCOSE 114*   < > 131*   < > 199*   < > 251*   < > 116* 105* 110* 99  BUN 42*   < > 48*   < > 45*   < > 49*   < > 41* 42* 53* 50*  CALCIUM 10.1   < > 9.7   < > 9.6   < > 9.9   < > 8.2* 8.2* 8.2* 8.4*  CREATININE 1.72*   < > 1.56*   < > 1.73*   < > 1.61*   < > 1.62* 1.75* 1.73* 1.61*  GFRNONAA 26*   < > 29*   < > 26*   < > 28*   < > 30* 28* 28* 31*  GFRAA 30*  --  34*  --  30*  --  33*  --   --   --   --   --    < > = values in this interval not displayed.    LIVER FUNCTION TESTS: Recent Labs    03/12/20 1109 04/02/20 1042 04/03/20 0024 04/07/20 0406  BILITOT 0.4 3.4* 2.7* 1.1  AST 23 47* 32 18  ALT 27 121* 86* 26  ALKPHOS 130* 270* 206* 167*  PROT 7.5 7.8 6.3* 6.0*  ALBUMIN  3.8 3.5 2.7* 2.2*    TUMOR MARKERS: Recent Labs    10/08/19 1006 12/09/19 1035  CA199 194* 224*    Assessment and Plan:  Acute cholecystitis: Glenda King. Heinzelman, 86 year old female, is tentatively scheduled for a percutaneous cholecystostomy drain placement 04/08/20 at the [facility] department.  Risks and benefits were discussed with the patient including, but not limited to, bleeding, infection, gallbladder perforation, bile leak, sepsis or even death.  All of the patient's questions were answered, patient is agreeable to proceed.  She will be NPO after midnight. AM labs have been ordered. She is not on any blood-thinning medications.  Consent signed and in chart.  Thank you for this interesting consult.  I greatly enjoyed meeting Glenda King and look forward to participating in their care.  A copy of this report was sent to the requesting provider on this date.  Electronically Signed: Soyla Dryer, AGACNP-BC [phone] 04/07/2020, 4:49 PM   I spent a total of 20 Minutes    in face to face in clinical consultation, greater than 50% of which was counseling/coordinating care for percutaneous cholecystostomy drain.

## 2020-04-07 NOTE — Progress Notes (Signed)
PROGRESS NOTE    Glenda King  UMP:[id number] DOB: [birth year] DOA: 04/02/2020 PCP: Javier Glazier, MD    Brief Narrative:  86 year old female with history of localized pancreatic cancer status post radiation treatment, seizures, hypertension and hypothyroidism.  Admitted to the hospital with 3 days of fatigue, generalized weakness, lightheadedness and dizziness.  On the day of admission she was seen at oncologist office, found to have A. fib with RVR with heart rate more than 130 and referred to the emergency room.  Patient was treated with diltiazem infusion and IV heparin and admitted to the hospital. 10/26, patient was seen by physical therapy and she was discharged to a skilled nursing facility.  She was about to leave the hospital.  A repeat surveillance MRI of the abdomen was done that showed acute cholecystitis, hence discharge was canceled and surgery was consulted.  She did have persistent leukocytosis.   Assessment & Plan:   Principal Problem:   New onset a-fib [facility]) Active Problems:   Malignant neoplasm of bladder, unspecified (HCC)   Hypothyroidism   Chronic kidney disease (CKD) stage G4/A1, severely decreased glomerular filtration rate (GFR) between 15-29 mL/min/1.73 square meter and albuminuria creatinine ratio less than 30 mg/g (HCC)   History of seizure   Anemia in chronic renal disease   History of urinary diversion procedure   Primary pancreatic cancer (HCC)   Acute lower UTI   Elevated troponin   Protein-calorie malnutrition, severe  New onset A. fib: Currently sinus rhythm.  Rate well controlled on metoprolol.  Echocardiogram was normal.  Initially started on anticoagulation, however per oncology's recommendation, high risk of bleeding so this was discontinued.  Currently remains a stable.  Acute calculus cholecystitis: With persistent leukocytosis and history of localized pancreatic cancer, patient underwent MRI of the abdomen that was positive for  acute calculus cholecystitis along with intra and extrahepatic biliary dilatation.  LFTs are normal. Seen by GI, she does have biliary stent, may need exchange. Seen by surgery and they recommended cholecystostomy tube placement, IR consulted. Remains on Rocephin and Flagyl and currently hemodynamically stable.  UTI: Suspected present on admission.  Blood cultures and urine cultures no growth.  Treated with Rocephin, now continued to cover for intra-abdominal infection.  CKD stage IV: Her creatinine at about baseline.  We will continue to monitor.  Hypokalemia/hypomagnesemia: Replaced and adequate.  Pancreatic cancer: Status post radiation.  Followed by Dr. Marin Olp.  Also has anemia of chronic disease. Received 2 units of PRBC while in the hospital. Received 1 unit of IV iron.  Seizure disorder: Currently well controlled on Keppra.  Severe protein calorie malnutrition: Nutrition Status: Nutrition Problem: Severe Malnutrition Etiology: chronic illness, cancer and cancer related treatments Signs/Symptoms: moderate fat depletion, moderate muscle depletion, severe fat depletion, severe muscle depletion Interventions: Boost Plus     DVT prophylaxis: SCDs   Code Status: DNR Family Communication: None, will call Disposition Plan: Status is: Inpatient  Remains inpatient appropriate because:IV treatments appropriate due to intensity of illness or inability to take PO and Inpatient level of care appropriate due to severity of illness   Dispo: The patient is from: Home, independent living              Anticipated d/c is to: SNF, or home with home health.              Anticipated d/c date is: > 3 days              Patient currently is not medically  stable to d/c.         Consultants:   Gastroenterology  General surgery  Oncology  Interventional radiology  Procedures:   None  Antimicrobials:  Anti-infectives (From admission, onward)   Start     Dose/Rate Route  Frequency Ordered Stop   04/06/20 2200  sulfamethoxazole-trimethoprim (BACTRIM DS) 800-160 MG per tablet 1 tablet  Status:  Discontinued        1 tablet Oral Every 12 hours 04/06/20 1728 04/06/20 1729   04/06/20 2200  cephALEXin (KEFLEX) capsule 250 mg  Status:  Discontinued        250 mg Oral Every 12 hours 04/06/20 1729 04/06/20 1741   04/06/20 1830  cefTRIAXone (ROCEPHIN) 2 g in sodium chloride 0.9 % 100 mL IVPB        2 g 200 mL/hr over 30 Minutes Intravenous Every 24 hours 04/06/20 1741     04/06/20 1830  metroNIDAZOLE (FLAGYL) IVPB 500 mg        500 mg 100 mL/hr over 60 Minutes Intravenous Every 8 hours 04/06/20 1741     04/06/20 0000  sulfamethoxazole-trimethoprim (BACTRIM DS) 800-160 MG tablet  Status:  Discontinued        1 tablet Oral Every 12 hours 04/06/20 1728 04/06/20    04/06/20 0000  cephALEXin (KEFLEX) 250 MG capsule        250 mg Oral Every 12 hours 04/06/20 1730 04/11/20 2359   04/02/20 2200  cefTRIAXone (ROCEPHIN) 1 g in sodium chloride 0.9 % 100 mL IVPB        1 g 200 mL/hr over 30 Minutes Intravenous Every 24 hours 04/02/20 2016 04/04/20 2154   04/02/20 1415  aztreonam (AZACTAM) 1 g in sodium chloride 0.9 % 100 mL IVPB        1 g 200 mL/hr over 30 Minutes Intravenous  Once 04/02/20 1413 04/02/20 1514         Subjective: Patient seen and examined.  Early morning rounds, she denied any abdominal pain.  She was able to take some liquids without any nausea or vomiting.  No other overnight events.  She walked better today.  Objective: Vitals:   04/06/20 2156 04/07/20 0451 04/07/20 0828 04/07/20 1251  BP: (!) 98/47 (!) 102/51 (!) 108/54 (!) 90/57  Pulse: 61 68 (!) 59 63  Resp: 20 16  18   Temp: 98 F (36.7 C) 98.4 F (36.9 C)  98.2 F (36.8 C)  TempSrc: Oral Oral  Oral  SpO2: 94% 96%  97%  Weight:      Height:        Intake/Output Summary (Last 24 hours) at 04/07/2020 1327 Last data filed at 04/07/2020 1100 Gross per 24 hour  Intake 183.02 ml  Output  1500 ml  Net -1316.98 ml   Filed Weights   04/02/20 2012  Weight: 44.4 kg    Examination:  General exam: Appears calm and comfortable  Chronically sick looking, she is cachectic but not in any distress. Respiratory system: Clear to auscultation. Respiratory effort normal. Cardiovascular system: S1 & S2 heard, RRR. No JVD, murmurs, rubs, gallops or clicks. No pedal edema. Gastrointestinal system: Abdomen is nondistended, soft and nontender. No organomegaly or masses felt. Normal bowel sounds heard. No localized rigidity or guarding. Central nervous system: Alert and oriented. No focal neurological deficits. Extremities: Symmetric 5 x 5 power. Skin: No rashes, lesions or ulcers Psychiatry: Judgement and insight appear normal. Mood & affect appropriate.     Data Reviewed: I have personally reviewed following  labs and imaging studies  CBC: Recent Labs  Lab 04/02/20 1345 04/03/20 0024 04/04/20 0515 04/06/20 0427 04/07/20 0406  WBC 20.8* 17.5* 16.4* 19.5* 19.8*  NEUTROABS 19.0* 15.9* 15.0* 17.8* 18.2*  HGB 10.4* 9.0* 8.1* 11.2* 10.4*  HCT 31.9* 27.0* 25.6* 33.4* 32.0*  MCV 97.9 97.1 101.2* 96.0 98.8  PLT 159 132* 136* 131* 570   Basic Metabolic Panel: Recent Labs  Lab 04/02/20 1042 04/02/20 1042 04/02/20 1245 04/02/20 1308 04/03/20 0024 04/04/20 0515 04/06/20 0427 04/07/20 0406  NA 133*   < > 135  --  137 135 136 134*  K 4.5   < > 4.1  --  3.8 3.9 3.3* 4.1  CL 99   < > 99  --  105 106 105 107  CO2 22  --   --   --  20* 20* 20* 18*  GLUCOSE 242*   < > 194*  --  116* 105* 110* 99  BUN 43*   < > 41*  --  41* 42* 53* 50*  CREATININE 1.88*  --  2.00*  --  1.62* 1.75* 1.73* 1.61*  CALCIUM 9.7  --   --   --  8.2* 8.2* 8.2* 8.4*  MG  --   --   --  1.7 1.8  --   --   --   PHOS  --   --   --   --  3.3  --   --   --    < > = values in this interval not displayed.   GFR: Estimated Creatinine Clearance: 16.9 mL/min (A) (by C-G formula based on SCr of 1.61 mg/dL  (H)). Liver Function Tests: Recent Labs  Lab 04/02/20 1042 04/03/20 0024 04/07/20 0406  AST 47* 32 18  ALT 121* 86* 26  ALKPHOS 270* 206* 167*  BILITOT 3.4* 2.7* 1.1  PROT 7.8 6.3* 6.0*  ALBUMIN 3.5 2.7* 2.2*   Recent Labs  Lab 04/02/20 1145  LIPASE 44  AMYLASE 57   No results for input(s): AMMONIA in the last 168 hours. Coagulation Profile: Recent Labs  Lab 04/02/20 1308 04/07/20 1154  INR 1.2 1.2   Cardiac Enzymes: No results for input(s): CKTOTAL, CKMB, CKMBINDEX, TROPONINI in the last 168 hours. BNP (last 3 results) No results for input(s): PROBNP in the last 8760 hours. HbA1C: No results for input(s): HGBA1C in the last 72 hours. CBG: Recent Labs  Lab 04/03/20 1204 04/03/20 1712 04/06/20 1524  GLUCAP 136* 132* 106*   Lipid Profile: No results for input(s): CHOL, HDL, LDLCALC, TRIG, CHOLHDL, LDLDIRECT in the last 72 hours. Thyroid Function Tests: No results for input(s): TSH, T4TOTAL, FREET4, T3FREE, THYROIDAB in the last 72 hours. Anemia Panel: No results for input(s): VITAMINB12, FOLATE, FERRITIN, TIBC, IRON, RETICCTPCT in the last 72 hours. Sepsis Labs: Recent Labs  Lab 04/02/20 1308 04/02/20 1515 04/02/20 2056  PROCALCITON  --   --  6.26  LATICACIDVEN 2.1* 1.4  --     Recent Results (from the past 240 hour(s))  Culture, blood (single)     Status: None   Collection Time: 04/02/20  1:05 PM   Specimen: BLOOD LEFT ARM  Result Value Ref Range Status   Specimen Description   Final    BLOOD LEFT ARM Performed at [facility], [address] [place], [place] [address]    Special Requests   Final    BOTTLES DRAWN AEROBIC AND ANAEROBIC Blood Culture adequate volume Performed at [facility],  [place], [place] [address]    Culture   Final    NO GROWTH 5 DAYS Performed at [facility], [place] [address] [place], [place] [address]    Report Status 04/07/2020 FINAL  Final  Urine culture     Status:  Abnormal   Collection Time: 04/02/20  1:08 PM   Specimen: Urine, Random  Result Value Ref Range Status   Specimen Description   Final    URINE, RANDOM Performed at [facility], [address] [place], [place] [address]    Special Requests   Final    NONE Performed at [facility], [address] [place], [place] [address]    Culture MULTIPLE SPECIES PRESENT, SUGGEST RECOLLECTION (A)  Final   Report Status 04/03/2020 FINAL  Final  Resp Panel by RT PCR (RSV, Flu A&B, Covid) - Nasopharyngeal Swab     Status: None   Collection Time: 04/02/20  1:08 PM   Specimen: Nasopharyngeal Swab  Result Value Ref Range Status   SARS Coronavirus 2 by RT PCR NEGATIVE NEGATIVE Final    Comment: (NOTE) SARS-CoV-2 target nucleic acids are NOT DETECTED.  The SARS-CoV-2 RNA is generally detectable in upper respiratoy specimens during the acute phase of infection. The lowest concentration of SARS-CoV-2 viral copies this assay can detect is 131 copies/mL. A negative result does not preclude SARS-Cov-2 infection and should not be used as the sole basis for treatment or other patient management decisions. A negative result may occur with  improper specimen collection/handling, submission of specimen other than nasopharyngeal swab, presence of viral mutation(s) within the areas targeted by this assay, and inadequate number of viral copies (<131 copies/mL). A negative result must be combined with clinical observations, patient history, and epidemiological information. The expected result is Negative.  Fact Sheet for Patients:  PinkCheek.be  Fact Sheet for Healthcare Providers:  GravelBags.it  This test is no t yet approved or cleared by the [place] FDA and  has been authorized for detection and/or diagnosis of SARS-CoV-2 by FDA under an Emergency Use Authorization (EUA). This EUA will remain  in effect (meaning this  test can be used) for the duration of the COVID-19 declaration under Section 564(b)(1) of the Act, 21 U.S.C. section 360bbb-3(b)(1), unless the authorization is terminated or revoked sooner.     Influenza A by PCR NEGATIVE NEGATIVE Final   Influenza B by PCR NEGATIVE NEGATIVE Final    Comment: (NOTE) The Xpert Xpress SARS-CoV-2/FLU/RSV assay is intended as an aid in  the diagnosis of influenza from Nasopharyngeal swab specimens and  should not be used as a sole basis for treatment. Nasal washings and  aspirates are unacceptable for Xpert Xpress SARS-CoV-2/FLU/RSV  testing.  Fact Sheet for Patients: PinkCheek.be  Fact Sheet for Healthcare Providers: GravelBags.it  This test is not yet approved or cleared by the [place] FDA and  has been authorized for detection and/or diagnosis of SARS-CoV-2 by  FDA under an Emergency Use Authorization (EUA). This EUA will remain  in effect (meaning this test can be used) for the duration of the  Covid-19 declaration under Section 564(b)(1) of the Act, 21  U.S.C. section 360bbb-3(b)(1), unless the authorization is  terminated or revoked.    Respiratory Syncytial Virus by PCR NEGATIVE NEGATIVE Final    Comment: (NOTE) Fact Sheet for Patients: PinkCheek.be  Fact Sheet for Healthcare Providers: GravelBags.it  This test is not yet approved or cleared by the  [place] Architectural technologist and  has been authorized for detection and/or diagnosis of SARS-CoV-2 by  FDA under an Print production planner (EUA). This EUA will remain  in effect (meaning this test can be used) for the duration of the  COVID-19 declaration under Section 564(b)(1) of the Act, 21 U.S.C.  section 360bbb-3(b)(1), unless the authorization is terminated or  revoked. Performed at [facility], [address] [place], [place] [address]   Culture, blood (x  2)     Status: None (Preliminary result)   Collection Time: 04/02/20 10:05 PM   Specimen: BLOOD  Result Value Ref Range Status   Specimen Description   Final    BLOOD LEFT HAND Performed at [place] [address], [place], [place] [address]    Special Requests   Final    BOTTLES DRAWN AEROBIC AND ANAEROBIC Blood Culture adequate volume Performed at [place] [address] [place], [place] [address]    Culture   Final    NO GROWTH 4 DAYS Performed at [facility], [place] [address] [place], [place] [address]    Report Status PENDING  Incomplete  Culture, blood (x 2)     Status: None (Preliminary result)   Collection Time: 04/02/20 10:05 PM   Specimen: BLOOD  Result Value Ref Range Status   Specimen Description   Final    BLOOD RIGHT HAND Performed at [place] [address] [place], [place] [address]    Special Requests   Final    BOTTLES DRAWN AEROBIC AND ANAEROBIC Blood Culture adequate volume Performed at [place] [address] [place], [place] [address]    Culture   Final    NO GROWTH 4 DAYS Performed at [facility], [place] [address], [place], [place] [address]    Report Status PENDING  Incomplete  SARS Coronavirus 2 by RT PCR (hospital order, performed in [facility] hospital lab) Nasopharyngeal Nasopharyngeal Swab     Status: None   Collection Time: 04/06/20  2:46 PM   Specimen: Nasopharyngeal Swab  Result Value Ref Range Status   SARS Coronavirus 2 NEGATIVE NEGATIVE Final    Comment: (NOTE) SARS-CoV-2 target nucleic acids are NOT DETECTED.  The SARS-CoV-2 RNA is generally detectable in upper and lower respiratory specimens during the acute phase of infection. The lowest concentration of SARS-CoV-2 viral copies this assay can detect is 250 copies / mL. A negative result does not preclude SARS-CoV-2 infection and should not be used as the sole basis for  treatment or other patient management decisions.  A negative result may occur with improper specimen collection / handling, submission of specimen other than nasopharyngeal swab, presence of viral mutation(s) within the areas targeted by this assay, and inadequate number of viral copies (<250 copies / mL). A negative result must be combined with clinical observations, patient history, and epidemiological information.  Fact Sheet for Patients:   StrictlyIdeas.no  Fact Sheet for Healthcare Providers: BankingDealers.co.za  This test is not yet approved or  cleared by the [place] FDA and has been authorized for detection and/or diagnosis of SARS-CoV-2 by FDA under an Emergency Use Authorization (EUA).  This EUA will remain in effect (meaning this test can be used) for the duration of the COVID-19 declaration under Section 564(b)(1) of the Act, 21 U.S.C. section 360bbb-3(b)(1), unless the authorization is terminated or revoked sooner.  Performed at [place], [address] [place], [place] [address]          Radiology Studies: MR ABDOMEN WO CONTRAST  Result Date: 04/06/2020 CLINICAL DATA:  Pancreatic cancer, bladder cancer, assess treatment response, EXAM: MRI ABDOMEN WITHOUT CONTRAST TECHNIQUE: Multiplanar multisequence MR imaging was performed without the administration of intravenous contrast. Patient declined contrast administration. COMPARISON:  CT abdomen pelvis, 12/10/2019, MR abdomen, 09/04/2019 FINDINGS: Examination is generally limited throughout by breath motion artifact and lack of intravenous contrast. Lower chest: No acute findings. Hepatobiliary: There is intra and extrahepatic biliary ductal dilatation, similar to prior examination, with abrupt obstruction of the central common bile duct in the pancreatic head. There appears to be significant scarring and volume loss of the left lobe of the  liver. A common bile duct stent appears to be present but is poorly imaged by MR. Numerous gallstones in the gallbladder with severe thickening and inflammatory change about the gallbladder wall, with what appear to be multiple ulcerations of the gallbladder wall (series 10, image 70, 62). There appears to be a gallstone near the gallbladder neck (series 4, image 24). Pancreas: Subtly T1 and T2 hypointense, small mass of the central pancreatic head is again noted, difficult to accurately measure given lack of intravenous contrast and small size but not obviously changed compared to prior examinations (series 10, image 71). Inflammatory changes, or other parenchymal abnormality identified. Spleen:  Within normal limits in size and appearance. Adrenals/Urinary Tract: Atrophic appearance of the bilateral kidneys, with moderate bilateral hydronephrosis. Stomach/Bowel: Visualized portions within the abdomen are unremarkable. Vascular/Lymphatic: No pathologically enlarged lymph nodes identified. No abdominal aortic aneurysm demonstrated. Other:  None. Musculoskeletal: No suspicious bone lesions identified. Pectus deformity of the included lower chest. IMPRESSION: 1. Examination is very limited by lack of intravenous contrast and breath motion artifact. 2. Numerous gallstones in the gallbladder with severe thickening and inflammatory change about the gallbladder wall, with what appear to be multiple ulcerations of the gallbladder wall. Findings are highly concerning for acute cholecystitis. There appears to be a gallstone near the gallbladder neck, which may be obstructing. 3. Subtly T1 and T2 hypointense, small mass of the central pancreatic head is again noted, difficult to accurately measure given lack of intravenous contrast and small size but not obviously changed compared to prior examinations. This remains consistent with small pancreatic adenocarcinoma. 4. Intra and extrahepatic biliary ductal dilatation, with  abrupt obstruction of the central common bile duct in the pancreatic head. A common bile duct stent appears to be present but is poorly imaged by MR. 5. Marked postobstructive scarring and volume loss of the left lobe of the liver, similar to prior examination. 6. Moderate bilateral hydronephrosis, slightly increased compared to prior examination. Distal ureters and bladder are not imaged. Consider additional imaging of the pelvis given reported history of bladder cancer. Electronically Signed   By: Eddie Candle M.D.   On: 04/06/2020 16:16   MR 3D Recon At Scanner  Result Date: 04/06/2020 CLINICAL DATA:  Pancreatic cancer, bladder cancer, assess treatment response, EXAM: MRI ABDOMEN WITHOUT CONTRAST TECHNIQUE: Multiplanar multisequence MR imaging was performed without the administration of intravenous contrast. Patient declined contrast administration. COMPARISON:  CT abdomen pelvis, 12/10/2019, MR abdomen, 09/04/2019 FINDINGS: Examination is generally limited throughout by breath motion artifact and lack of intravenous contrast. Lower chest: No acute findings. Hepatobiliary: There is intra and extrahepatic biliary ductal dilatation, similar to prior examination, with abrupt obstruction of the central common bile duct in the pancreatic head. There appears to be significant scarring and volume  loss of the left lobe of the liver. A common bile duct stent appears to be present but is poorly imaged by MR. Numerous gallstones in the gallbladder with severe thickening and inflammatory change about the gallbladder wall, with what appear to be multiple ulcerations of the gallbladder wall (series 10, image 70, 62). There appears to be a gallstone near the gallbladder neck (series 4, image 24). Pancreas: Subtly T1 and T2 hypointense, small mass of the central pancreatic head is again noted, difficult to accurately measure given lack of intravenous contrast and small size but not obviously changed compared to prior  examinations (series 10, image 71). Inflammatory changes, or other parenchymal abnormality identified. Spleen:  Within normal limits in size and appearance. Adrenals/Urinary Tract: Atrophic appearance of the bilateral kidneys, with moderate bilateral hydronephrosis. Stomach/Bowel: Visualized portions within the abdomen are unremarkable. Vascular/Lymphatic: No pathologically enlarged lymph nodes identified. No abdominal aortic aneurysm demonstrated. Other:  None. Musculoskeletal: No suspicious bone lesions identified. Pectus deformity of the included lower chest. IMPRESSION: 1. Examination is very limited by lack of intravenous contrast and breath motion artifact. 2. Numerous gallstones in the gallbladder with severe thickening and inflammatory change about the gallbladder wall, with what appear to be multiple ulcerations of the gallbladder wall. Findings are highly concerning for acute cholecystitis. There appears to be a gallstone near the gallbladder neck, which may be obstructing. 3. Subtly T1 and T2 hypointense, small mass of the central pancreatic head is again noted, difficult to accurately measure given lack of intravenous contrast and small size but not obviously changed compared to prior examinations. This remains consistent with small pancreatic adenocarcinoma. 4. Intra and extrahepatic biliary ductal dilatation, with abrupt obstruction of the central common bile duct in the pancreatic head. A common bile duct stent appears to be present but is poorly imaged by MR. 5. Marked postobstructive scarring and volume loss of the left lobe of the liver, similar to prior examination. 6. Moderate bilateral hydronephrosis, slightly increased compared to prior examination. Distal ureters and bladder are not imaged. Consider additional imaging of the pelvis given reported history of bladder cancer. Electronically Signed   By: Eddie Candle M.D.   On: 04/06/2020 16:16        Scheduled Meds: . lactose free  nutrition  237 mL Oral BID BM  . levETIRAcetam  500 mg Oral BID  . levothyroxine  100 mcg Oral Q0600  . melatonin  10 mg Oral QHS  . metoprolol tartrate  25 mg Oral BID  . pantoprazole  40 mg Oral Daily  . sodium bicarbonate  650 mg Oral BID   Continuous Infusions: . cefTRIAXone (ROCEPHIN)  IV Stopped (04/06/20 2140)  . metronidazole 500 mg (04/07/20 1015)     LOS: 5 days    Time spent: 35 minutes    Barb Merino, MD [facility] Pager [phone]

## 2020-04-07 NOTE — Consult Note (Addendum)
Consultation  Referring Provider:  [facility] / Arrien MD Primary Care Physician:  Javier Glazier, MD Primary Gastroenterologist:  Dr.Perry   Reason for Consultation:   Pancreatic cancer/cholecytitis / dilated CBD  HPI: ZARIELLE CEA is a 86 y.o. female, established with Dr. Henrene Pastor who was diagnosed with a localized pancreatic head malignancy in March 2021 when she had presented with jaundice.  She underwent ERCP, sphincterotomy balloon sweep of duct and placement of a ten French 7 cm plastic stent through distal common bile duct stricture.  She has been followed by Dr. Marin Olp and completed a course of radiation and August 2021. Admitted currently on 04/02/2020 with weakness and nausea and found to have new atrial fibrillation.  Patient says she had been having right-sided abdominal pain for about 4 days prior to admission and has continued to have right-sided abdominal pain which has progressed over the past couple of days.  She had not had any associated fever, no nausea or vomiting but says she has not had much if any appetite. She was found to have a urinary tract infection on admission, without sepsis.  She is status post cystectomy for bladder cancer previously.  She was to be discharged yesterday however with complaints of increased right-sided abdominal pain and need for follow-up of the pancreatic tumor decision was made to proceed with MRI. MRI shows multiple gallstones, there is severe gallbladder wall thickening with ulcerations of the gallbladder wall and a stone in the cystic duct, all consistent with cholecystitis.  There is intra and extrahepatic ductal dilation with abrupt obstruction of the central common bile duct, this common bile duct stent in place poorly imaged on this noncontrasted study, small stable pancreatic head mass.  Labs today with T bili 1.1/alk phos 167/AST 18/ALT twenty-six WBC on the rise up to 19.8, hemoglobin 10.4. She has been started on Rocephin and  metronidazole. She was also started on analgesics/morphine elixir yesterday and said her pain is under good parenteral today though she remains tender.  Past Medical History:  Diagnosis Date  . Anemia   . Cancer [facility])    bladder cancer  . Cardiomyopathy ([place])   . CKD (chronic kidney disease)   . Goals of care, counseling/discussion 12/10/2019  . Primary pancreatic cancer ([place]) 12/10/2019  . Thyroid disease    hypothyroid    Past Surgical History:  Procedure Laterality Date  . BILIARY STENT PLACEMENT N/A 09/05/2019   Procedure: BILIARY STENT PLACEMENT;  Surgeon: Irene Shipper, MD;  Location: [facility];  Service: Endoscopy;  Laterality: N/A;  . BIOPSY  12/03/2019   Procedure: BIOPSY;  Surgeon: Rush Landmark Telford Nab., MD;  Location: [facility];  Service: Gastroenterology;;  . ERCP N/A 09/05/2019   Procedure: ENDOSCOPIC RETROGRADE CHOLANGIOPANCREATOGRAPHY (ERCP);  Surgeon: Irene Shipper, MD;  Location: [facility];  Service: Endoscopy;  Laterality: N/A;  . ESOPHAGOGASTRODUODENOSCOPY (EGD) WITH PROPOFOL N/A 12/03/2019   Procedure: ESOPHAGOGASTRODUODENOSCOPY (EGD) WITH PROPOFOL;  Surgeon: Rush Landmark Telford Nab., MD;  Location: [facility];  Service: Gastroenterology;  Laterality: N/A;  . EUS N/A 12/03/2019   Procedure: UPPER ENDOSCOPIC ULTRASOUND (EUS) LINEAR;  Surgeon: Irving Copas., MD;  Location: [facility];  Service: Gastroenterology;  Laterality: N/A;  . FINE NEEDLE ASPIRATION N/A 12/03/2019   Procedure: FINE NEEDLE ASPIRATION (FNA) LINEAR;  Surgeon: Irving Copas., MD;  Location: [facility];  Service: Gastroenterology;  Laterality: N/A;  . SPHINCTEROTOMY  09/05/2019   Procedure: SPHINCTEROTOMY;  Surgeon: Irene Shipper, MD;  Location: [facility];  Service:  Endoscopy;;    Prior to Admission medications   Medication Sig Start Date End Date Taking? Authorizing Provider  cholecalciferol (VITAMIN D3) 25 MCG (1000 UNIT) tablet Take 1,000 Units by mouth 2 (two)  times daily.   Yes [provider]  levETIRAcetam (KEPPRA) 500 MG tablet Take 500 mg by mouth 2 (two) times daily. 05/13/19  Yes [provider]  levothyroxine (SYNTHROID) 100 MCG tablet Take 100 mcg by mouth daily. 05/13/19  Yes [provider]  Melatonin 10 MG CAPS Take 10 mg by mouth at bedtime.   Yes [provider]  sodium bicarbonate 650 MG tablet Take 650 mg by mouth 2 (two) times daily. 05/27/19  Yes [provider]  vitamin B-12 1000 MCG tablet Take 1 tablet (1,000 mcg total) by mouth daily. 09/06/19  Yes Regalado, Belkys A, MD  cephALEXin (KEFLEX) 250 MG capsule Take 1 capsule (250 mg total) by mouth every 12 (twelve) hours for 5 days. 04/06/20 04/11/20  Arrien, York Ram, MD  lactose free nutrition (BOOST PLUS) LIQD Take 237 mLs by mouth 2 (two) times daily between meals. 04/06/20 05/06/20  Arrien, York Ram, MD  metoprolol tartrate (LOPRESSOR) 25 MG tablet Take 1 tablet (25 mg total) by mouth 2 (two) times daily. 04/06/20 05/06/20  Arrien, York Ram, MD  pantoprazole (PROTONIX) 40 MG tablet Take 1 tablet (40 mg total) by mouth daily. 04/07/20 05/07/20  Arrien, York Ram, MD    Current Facility-Administered Medications  Medication Dose Route Frequency Provider Last Rate Last Admin  . acetaminophen (TYLENOL) tablet 650 mg  650 mg Oral Q6H PRN Therisa Doyne, MD       Or  . acetaminophen (TYLENOL) suppository 650 mg  650 mg Rectal Q6H PRN Doutova, Anastassia, MD      . cefTRIAXone (ROCEPHIN) 2 g in sodium chloride 0.9 % 100 mL IVPB  2 g Intravenous Q24H Arrien, York Ram, MD   Stopped at 04/06/20 2140  . lactose free nutrition (BOOST PLUS) liquid 237 mL  237 mL Oral BID BM Arrien, York Ram, MD   237 mL at 04/06/20 0920  . levETIRAcetam (KEPPRA) tablet 500 mg  500 mg Oral BID Therisa Doyne, MD   500 mg at 04/07/20 0827  . levothyroxine (SYNTHROID) tablet 100 mcg  100 mcg Oral Q0600 Therisa Doyne, MD   100 mcg at 04/07/20 0076  . melatonin tablet 10 mg  10 mg Oral QHS Therisa Doyne, MD   10 mg at 04/06/20 2156  . metoprolol tartrate (LOPRESSOR) injection 5 mg  5 mg Intravenous Q4H PRN Arrien, York Ram, MD      . metoprolol tartrate (LOPRESSOR) tablet 25 mg  25 mg Oral BID Coralie Keens, MD   25 mg at 04/07/20 2263  . metroNIDAZOLE (FLAGYL) IVPB 500 mg  500 mg Intravenous Q8H Arrien, York Ram, MD 100 mL/hr at 04/07/20 0301 500 mg at 04/07/20 0301  . morphine CONCENTRATE 10 MG/0.5ML oral solution 5 mg  5 mg Oral Q2H PRN Josph Macho, MD   5 mg at 04/07/20 0258  . pantoprazole (PROTONIX) EC tablet 40 mg  40 mg Oral Daily Arrien, York Ram, MD   40 mg at 04/07/20 0827  . sodium bicarbonate tablet 650 mg  650 mg Oral BID Therisa Doyne, MD   650 mg at 04/07/20 0827    Allergies as of 04/02/2020 - Review Complete 04/02/2020  Allergen Reaction Noted  . Gadolinium derivatives Other (See Comments) 04/01/2014  . Amoxicillin Other (See Comments)  02/27/2020  . Pollen extract Other (See Comments) 11/13/2012    Family History  Problem Relation Age of Onset  . Hypertension Other     Social History   Socioeconomic History  . Marital status: Married    Spouse name: Not on file  . Number of children: Not on file  . Years of education: Not on file  . Highest education level: Not on file  Occupational History  . Not on file  Tobacco Use  . Smoking status: Never Smoker  . Smokeless tobacco: Never Used  Vaping Use  . Vaping Use: Never used  Substance and Sexual Activity  . Alcohol use: Not Currently  . Drug use: Not Currently  . Sexual activity: Not on file  Other Topics Concern  . Not on file  Social History Narrative  . Not on file   Social Determinants of Health   Financial Resource Strain:   . Difficulty of Paying Living Expenses: Not on file  Food Insecurity:   . Worried About Programme researcher, broadcasting/film/video in the Last Year: Not  on file  . Ran Out of Food in the Last Year: Not on file  Transportation Needs:   . Lack of Transportation (Medical): Not on file  . Lack of Transportation (Non-Medical): Not on file  Physical Activity:   . Days of Exercise per Week: Not on file  . Minutes of Exercise per Session: Not on file  Stress:   . Feeling of Stress : Not on file  Social Connections:   . Frequency of Communication with Friends and Family: Not on file  . Frequency of Social Gatherings with Friends and Family: Not on file  . Attends Religious Services: Not on file  . Active Member of Clubs or Organizations: Not on file  . Attends Banker Meetings: Not on file  . Marital Status: Not on file  Intimate Partner Violence:   . Fear of Current or Ex-Partner: Not on file  . Emotionally Abused: Not on file  . Physically Abused: Not on file  . Sexually Abused: Not on file    Review of Systems: Pertinent positive and negative review of systems were noted in the above HPI section.  All other review of systems was otherwise negative.  Physical Exam: Vital signs in last 24 hours: Temp:  [97.7 F (36.5 C)-98.4 F (36.9 C)] 98.4 F (36.9 C) (10/27 0451) Pulse Rate:  [57-68] 59 (10/27 0828) Resp:  [16-20] 16 (10/27 0451) BP: (93-108)/(47-54) 108/54 (10/27 0828) SpO2:  [94 %-98 %] 96 % (10/27 0451) Last BM Date: 04/02/20 (will notify MD) General:   Alert,  Well-developed, elderly white female well-nourished, pleasant and cooperative in NAD sitting up in chair Head:  Normocephalic and atraumatic. Eyes:  Sclera clear, no icterus.   Conjunctiva pink. Ears:  Normal auditory acuity. Nose:  No deformity, discharge,  or lesions. Mouth:  No deformity or lesions.   Neck:  Supple; no masses or thyromegaly. Lungs:  Clear throughout to auscultation.   No wheezes, crackles, or rhonchi. Heart:  Regular rate and rhythm; no murmurs, clicks, rubs,  or gallops. Abdomen:  Soft, she is quite tender in the right upper  quadrant with some rebound BS active,nonpalp mass or hsm.   Msk:  Symmetrical without gross deformities. . Pulses:  Normal pulses noted. Extremities:  Without clubbing or edema. Neurologic:  Alert and  oriented x4;  grossly normal neurologically. Skin:  Intact without significant lesions or rashes.. Psych:  Alert and cooperative. Normal  mood and affect.  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  She underwent ERCP with stent placement for common bile duct stricture at that time, then completed a course of radiation. Admitted 4 days ago with complaints of weakness, found to have new atrial fibrillation and a urinary tract infection without sepsis.   Patient says she had been having right-sided abdominal pain for about 4 days prior to admission which has persisted and progressed. MRI consistent with acute cholecystitis with multiple gallstones, severe gallbladder wall thickening, cystic duct stone and chronically dilated intra and extrahepatic ductal dilation with CBD stent in place.  T bili 1.1, no evidence for CBD obstruction at this time though stent may need to be changed as was placed 6 months ago.  Current issue seems to be acute cholecystitis.  Patient is a high risk surgical candidate, and surgery will be unlikely to proceed with lap chole, if so then patient will likely need  cholecystostomy tube for drainage/decompression.  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  Current plastic common bile duct stent has been in for about 6 months and will need to be changed.  Will discuss timing.  Repeat labs in a.m. Have changed to full liquid diet at patient's request and continue boost supplements  Thank you GI will follow with you     Amy Esterwood PA-C 04/07/2020, 9:19 AM    [facility] Attending   I have taken an interval history, reviewed the chart and examined the patient. I agree with the Advanced Practitioner's note, impression and recommendations.    Pleasant very elderly white woman with pancreatic cancer as above, she has findings consistent with cholecystitis on imaging.  She is not a good candidate for surgery.  However question if a cholecystostomy tube makes sense.  We have asked for surgery's input.  I do not think her stent is a problem right now but we will need to consider changing that versus simply removing it depending upon the residual anatomy.  She has had a sphincterotomy and subsequent treatment.  It would seem logical to place a metal stent when this is replaced.  We will follow.  Gatha Mayer, MD, [facility] 04/07/2020 12:19 PM

## 2020-04-07 NOTE — Plan of Care (Signed)

## 2020-04-07 NOTE — Consult Note (Addendum)
[facility] Surgery Consult Note  Glenda King [birth year]  [id number].    Requesting MD: Silvano Rusk Chief Complaint/Reason for Consult: cholecystitis  HPI:  Glenda King is an 86yo female with h/o localized pancreatic head cancer diagnosed 08/2019. She is followed by Dr. Marin Olp. She underwent ERCP for distal common bile duct stricture secondary to the cancer with placement of a common bile duct stent 08/2019. She is followed by Dr. Marin Olp who treated her with stereotactic radiosurgery which she completed 01/2020. Patient was admitted 10/22 with weakness and was found to have new onset atrial fibrillation. She was also found to have a UTI on u/a treated with 3 days of ceftriaxone; urine culture contaminated. She was set to be discharged yesterday but developed worsening abdominal pain. She does state that a few days prior to admission she had experienced some intermittent right sided abdominal pain. Over the past couple of days her pain became more intense. It is RUQ, worse with movement or palpation. She reports mild nausea at times, no emesis, fever, or chills. Previously she did not have much of an appetite but states that food did not make her pain worse.  MRI was obtained for further evaluation of her abdominal pain and showed numerous gallstones, severe gallbladder wall thickening with ulcerations of the gallbladder wall and a stone in the neck of the gallbladder, all consistent with cholecystitis; intra and extrahepatic ductal dilation with abrupt obstruction of the central common bile duct, common bile duct stent in place poorly imaged on this noncontrasted MRI, small stable pancreatic head mass. On admission WBC 22.2, LFTs elevated with AST 32, ALT 86, Alk phos 206, Tbili 2.7. Today WBC 19.8, LFTs WNL except Alk phos 167.  Patient was restarted on IV rocephin with addition of IV flagyl, and given morphine elixir for pain. GI is following and considering ERCP with common  duct stent exchange. General surgery asked to see regarding cholecystitis.  Abdominal surgical history: open appendectomy, cystectomy and RLQ urinary diversion Anticoagulants: none PTA lived at [facility], planning discharge to their SNF  Review of Systems  Constitutional: Positive for malaise/fatigue and weight loss. Negative for chills and fever.  HENT: Negative.   Eyes: Negative.   Respiratory: Negative.   Cardiovascular: Negative.   Gastrointestinal: Positive for abdominal pain and nausea. Negative for vomiting.  Genitourinary: Negative.   Musculoskeletal: Negative.    All systems reviewed and otherwise negative except for as above  Family History  Problem Relation Age of Onset  . Hypertension Other     Past Medical History:  Diagnosis Date  . Anemia   . Cancer [facility])    bladder cancer  . Cardiomyopathy ([place])   . CKD (chronic kidney disease)   . Goals of care, counseling/discussion 12/10/2019  . Primary pancreatic cancer ([place]) 12/10/2019  . Thyroid disease    hypothyroid    Past Surgical History:  Procedure Laterality Date  . BILIARY STENT PLACEMENT N/A 09/05/2019   Procedure: BILIARY STENT PLACEMENT;  Surgeon: Irene Shipper, MD;  Location: [facility] ENDOSCOPY;  Service: Endoscopy;  Laterality: N/A;  . BIOPSY  12/03/2019   Procedure: BIOPSY;  Surgeon: Rush Landmark Telford Nab., MD;  Location: [facility] ENDOSCOPY;  Service: Gastroenterology;;  . ERCP N/A 09/05/2019   Procedure: ENDOSCOPIC RETROGRADE CHOLANGIOPANCREATOGRAPHY (ERCP);  Surgeon: Irene Shipper, MD;  Location: [facility];  Service: Endoscopy;  Laterality: N/A;  . ESOPHAGOGASTRODUODENOSCOPY (EGD) WITH PROPOFOL N/A 12/03/2019   Procedure: ESOPHAGOGASTRODUODENOSCOPY (EGD) WITH PROPOFOL;  Surgeon: Rush Landmark Telford Nab., MD;  Location:  [facility] ENDOS[facility];  Service: Gastroenterology;  Laterality: N/A;  . EUS N/A 12/03/2019   Procedure: UPPER ENDOSCOPIC ULTRASOUND (EUS) LINEAR;  Surgeon: Irving Copas., MD;  Location: [facility]  ENDOSCOPY;  Service: Gastroenterology;  Laterality: N/A;  . FINE NEEDLE ASPIRATION N/A 12/03/2019   Procedure: FINE NEEDLE ASPIRATION (FNA) LINEAR;  Surgeon: Irving Copas., MD;  Location: [facility];  Service: Gastroenterology;  Laterality: N/A;  . SPHINCTEROTOMY  09/05/2019   Procedure: SPHINCTEROTOMY;  Surgeon: Irene Shipper, MD;  Location: [facility];  Service: Endoscopy;;    Social History:  reports that she has never smoked. She has never used smokeless tobacco. She reports previous alcohol use. She reports previous drug use.  Allergies:  Allergies  Allergen Reactions  . Gadolinium Derivatives Other (See Comments)    Secondary to her stage IV kidney disease   . Amoxicillin Other (See Comments)    UNKNOWN REACTION     . Pollen Extract Other (See Comments)    Sneezing    Medications Prior to Admission  Medication Sig Dispense Refill  . cholecalciferol (VITAMIN D3) 25 MCG (1000 UNIT) tablet Take 1,000 Units by mouth 2 (two) times daily.    Marland Kitchen levETIRAcetam (KEPPRA) 500 MG tablet Take 500 mg by mouth 2 (two) times daily.    Marland Kitchen levothyroxine (SYNTHROID) 100 MCG tablet Take 100 mcg by mouth daily.    . Melatonin 10 MG CAPS Take 10 mg by mouth at bedtime.    . sodium bicarbonate 650 MG tablet Take 650 mg by mouth 2 (two) times daily.    . vitamin B-12 1000 MCG tablet Take 1 tablet (1,000 mcg total) by mouth daily. 30 tablet 0  . [DISCONTINUED] ferrous sulfate 325 (65 FE) MG tablet Take 1 tablet (325 mg total) by mouth daily with breakfast. 30 tablet 3  . [DISCONTINUED] metoprolol tartrate (LOPRESSOR) 25 MG tablet Take 12.5 mg by mouth 2 (two) times daily.       Prior to Admission medications   Medication Sig Start Date End Date Taking? Authorizing Provider  cholecalciferol (VITAMIN D3) 25 MCG (1000 UNIT) tablet Take 1,000 Units by mouth 2 (two) times daily.   Yes [provider]  levETIRAcetam (KEPPRA) 500 MG tablet Take 500 mg by mouth 2 (two) times daily.  05/13/19  Yes [provider]  levothyroxine (SYNTHROID) 100 MCG tablet Take 100 mcg by mouth daily. 05/13/19  Yes [provider]  Melatonin 10 MG CAPS Take 10 mg by mouth at bedtime.   Yes [provider]  sodium bicarbonate 650 MG tablet Take 650 mg by mouth 2 (two) times daily. 05/27/19  Yes [provider]  vitamin B-12 1000 MCG tablet Take 1 tablet (1,000 mcg total) by mouth daily. 09/06/19  Yes Regalado, Belkys A, MD  cephALEXin (KEFLEX) 250 MG capsule Take 1 capsule (250 mg total) by mouth every 12 (twelve) hours for 5 days. 04/06/20 04/11/20  Arrien, Jimmy Picket, MD  lactose free nutrition (BOOST PLUS) LIQD Take 237 mLs by mouth 2 (two) times daily between meals. 04/06/20 05/06/20  Arrien, Jimmy Picket, MD  metoprolol tartrate (LOPRESSOR) 25 MG tablet Take 1 tablet (25 mg total) by mouth 2 (two) times daily. 04/06/20 05/06/20  Arrien, Jimmy Picket, MD  pantoprazole (PROTONIX) 40 MG tablet Take 1 tablet (40 mg total) by mouth daily. 04/07/20 05/07/20  Arrien, Jimmy Picket, MD    Blood pressure (!) 108/54, pulse (!) 59, temperature 98.4 F (36.9 C), temperature source Oral, resp. rate 16, height 5' 8.4" (  1.737 m), weight 44.4 kg, SpO2 96 %. Physical Exam: General: pleasant, frail elderly female who is sitting up in chair in NAD HEENT: head is normocephalic, atraumatic.  Sclera are noninjected.  PERRL.  Ears and nose without any masses or lesions.  Mouth is pink and moist. Dentition fair Heart: irregular, rate, and rhythm.  Palpable pedal pulses bilaterally  Lungs: CTAB, no wheezes, rhonchi, or rales noted.  Respiratory effort nonlabored Abd: well healed midline and RLQ incisions, soft, ND, +BS, no masses, hernias, or organomegaly. TTP RUQ and epigastric region with some guarding MS: no BUE/BLE edema, calves soft and nontender Skin: warm and dry with no masses, lesions, or rashes Psych: A&Ox4 with an appropriate affect Neuro: cranial nerves  grossly intact, equal strength in BUE/BLE bilaterally, normal speech, thought process intact  Results for orders placed or performed during the hospital encounter of 04/02/20 (from the past 48 hour(s))  CBC with Differential/Platelet     Status: Abnormal   Collection Time: 04/06/20  4:27 AM  Result Value Ref Range   WBC 19.5 (H) 4.0 - 10.5 K/uL   RBC 3.48 (L) 3.87 - 5.11 MIL/uL   Hemoglobin 11.2 (L) 12.0 - 15.0 g/dL    Comment: REPEATED TO VERIFY POST TRANSFUSION SPECIMEN DELTA CHECK NOTED    HCT 33.4 (L) 36 - 46 %   MCV 96.0 80.0 - 100.0 fL   MCH 32.2 26.0 - 34.0 pg   MCHC 33.5 30.0 - 36.0 g/dL   RDW 13.7 11.5 - 15.5 %   Platelets 131 (L) 150 - 400 K/uL   nRBC 0.0 0.0 - 0.2 %   Neutrophils Relative % 92 %   Neutro Abs 17.8 (H) 1.7 - 7.7 K/uL   Lymphocytes Relative 2 %   Lymphs Abs 0.4 (L) 0.7 - 4.0 K/uL   Monocytes Relative 4 %   Monocytes Absolute 0.9 0.1 - 1.0 K/uL   Eosinophils Relative 0 %   Eosinophils Absolute 0.0 0.0 - 0.5 K/uL   Basophils Relative 0 %   Basophils Absolute 0.1 0.0 - 0.1 K/uL   Immature Granulocytes 2 %   Abs Immature Granulocytes 0.40 (H) 0.00 - 0.07 K/uL    Comment: Performed at [facility], [place] [address] [place], [place] [address]  Basic metabolic panel     Status: Abnormal   Collection Time: 04/06/20  4:27 AM  Result Value Ref Range   Sodium 136 135 - 145 mmol/L   Potassium 3.3 (L) 3.5 - 5.1 mmol/L   Chloride 105 98 - 111 mmol/L   CO2 20 (L) 22 - 32 mmol/L   Glucose, Bld 110 (H) 70 - 99 mg/dL    Comment: Glucose reference range applies only to samples taken after fasting for at least 8 hours.   BUN 53 (H) 8 - 23 mg/dL   Creatinine, Ser 1.73 (H) 0.44 - 1.00 mg/dL   Calcium 8.2 (L) 8.9 - 10.3 mg/dL   GFR, Estimated 28 (L) >60 mL/min    Comment: (NOTE) Calculated using the CKD-EPI Creatinine Equation (2021)    Anion gap 11 5 - 15    Comment: Performed at [facility], [place] [address] [place],  [place] [address]  SARS Coronavirus 2 by RT PCR (hospital order, performed in [facility] hospital lab) Nasopharyngeal Nasopharyngeal Swab     Status: None   Collection Time: 04/06/20  2:46 PM   Specimen: Nasopharyngeal Swab  Result Value Ref Range   SARS Coronavirus 2 NEGATIVE NEGATIVE  Comment: (NOTE) SARS-CoV-2 target nucleic acids are NOT DETECTED.  The SARS-CoV-2 RNA is generally detectable in upper and lower respiratory specimens during the acute phase of infection. The lowest concentration of SARS-CoV-2 viral copies this assay can detect is 250 copies / mL. A negative result does not preclude SARS-CoV-2 infection and should not be used as the sole basis for treatment or other patient management decisions.  A negative result may occur with improper specimen collection / handling, submission of specimen other than nasopharyngeal swab, presence of viral mutation(s) within the areas targeted by this assay, and inadequate number of viral copies (<250 copies / mL). A negative result must be combined with clinical observations, patient history, and epidemiological information.  Fact Sheet for Patients:   StrictlyIdeas.no  Fact Sheet for Healthcare Providers: BankingDealers.co.za  This test is not yet approved or  cleared by the [place] FDA and has been authorized for detection and/or diagnosis of SARS-CoV-2 by FDA under an Emergency Use Authorization (EUA).  This EUA will remain in effect (meaning this test can be used) for the duration of the COVID-19 declaration under Section 564(b)(1) of the Act, 21 U.S.C. section 360bbb-3(b)(1), unless the authorization is terminated or revoked sooner.  Performed at [facility], [place] [address], [place], [place] [address]   Glucose, capillary     Status: Abnormal   Collection Time: 04/06/20  3:24 PM  Result Value Ref Range   Glucose-Capillary 106 (H) 70 - 99 mg/dL     Comment: Glucose reference range applies only to samples taken after fasting for at least 8 hours.   Comment 1 Document in Chart    Comment 2 Procedure Error   Comprehensive metabolic panel     Status: Abnormal   Collection Time: 04/07/20  4:06 AM  Result Value Ref Range   Sodium 134 (L) 135 - 145 mmol/L   Potassium 4.1 3.5 - 5.1 mmol/L    Comment: DELTA CHECK NOTED NO VISIBLE HEMOLYSIS    Chloride 107 98 - 111 mmol/L   CO2 18 (L) 22 - 32 mmol/L   Glucose, Bld 99 70 - 99 mg/dL    Comment: Glucose reference range applies only to samples taken after fasting for at least 8 hours.   BUN 50 (H) 8 - 23 mg/dL   Creatinine, Ser 1.61 (H) 0.44 - 1.00 mg/dL   Calcium 8.4 (L) 8.9 - 10.3 mg/dL   Total Protein 6.0 (L) 6.5 - 8.1 g/dL   Albumin 2.2 (L) 3.5 - 5.0 g/dL   AST 18 15 - 41 U/L   ALT 26 0 - 44 U/L   Alkaline Phosphatase 167 (H) 38 - 126 U/L   Total Bilirubin 1.1 0.3 - 1.2 mg/dL   GFR, Estimated 31 (L) >60 mL/min    Comment: (NOTE) Calculated using the CKD-EPI Creatinine Equation (2021)    Anion gap 9 5 - 15    Comment: Performed at [facility], [place] [address], [place], [place] [address]  CBC with Differential/Platelet     Status: Abnormal   Collection Time: 04/07/20  4:06 AM  Result Value Ref Range   WBC 19.8 (H) 4.0 - 10.5 K/uL   RBC 3.24 (L) 3.87 - 5.11 MIL/uL   Hemoglobin 10.4 (L) 12.0 - 15.0 g/dL   HCT 32.0 (L) 36 - 46 %   MCV 98.8 80.0 - 100.0 fL   MCH 32.1 26.0 - 34.0 pg   MCHC 32.5 30.0 - 36.0 g/dL   RDW 13.6  11.5 - 15.5 %   Platelets 159 150 - 400 K/uL   nRBC 0.0 0.0 - 0.2 %   Neutrophils Relative % 92 %   Neutro Abs 18.2 (H) 1.7 - 7.7 K/uL   Lymphocytes Relative 2 %   Lymphs Abs 0.4 (L) 0.7 - 4.0 K/uL   Monocytes Relative 4 %   Monocytes Absolute 0.8 0.1 - 1.0 K/uL   Eosinophils Relative 0 %   Eosinophils Absolute 0.1 0.0 - 0.5 K/uL   Basophils Relative 0 %   Basophils Absolute 0.1 0.0 - 0.1 K/uL   Immature Granulocytes 2 %   Abs Immature  Granulocytes 0.36 (H) 0.00 - 0.07 K/uL    Comment: Performed at [facility], [place] [address], [place], [place] [address]   MR ABDOMEN WO CONTRAST  Result Date: 04/06/2020 CLINICAL DATA:  Pancreatic cancer, bladder cancer, assess treatment response, EXAM: MRI ABDOMEN WITHOUT CONTRAST TECHNIQUE: Multiplanar multisequence MR imaging was performed without the administration of intravenous contrast. Patient declined contrast administration. COMPARISON:  CT abdomen pelvis, 12/10/2019, MR abdomen, 09/04/2019 FINDINGS: Examination is generally limited throughout by breath motion artifact and lack of intravenous contrast. Lower chest: No acute findings. Hepatobiliary: There is intra and extrahepatic biliary ductal dilatation, similar to prior examination, with abrupt obstruction of the central common bile duct in the pancreatic head. There appears to be significant scarring and volume loss of the left lobe of the liver. A common bile duct stent appears to be present but is poorly imaged by MR. Numerous gallstones in the gallbladder with severe thickening and inflammatory change about the gallbladder wall, with what appear to be multiple ulcerations of the gallbladder wall (series 10, image 70, 62). There appears to be a gallstone near the gallbladder neck (series 4, image 24). Pancreas: Subtly T1 and T2 hypointense, small mass of the central pancreatic head is again noted, difficult to accurately measure given lack of intravenous contrast and small size but not obviously changed compared to prior examinations (series 10, image 71). Inflammatory changes, or other parenchymal abnormality identified. Spleen:  Within normal limits in size and appearance. Adrenals/Urinary Tract: Atrophic appearance of the bilateral kidneys, with moderate bilateral hydronephrosis. Stomach/Bowel: Visualized portions within the abdomen are unremarkable. Vascular/Lymphatic: No pathologically enlarged lymph nodes identified.  No abdominal aortic aneurysm demonstrated. Other:  None. Musculoskeletal: No suspicious bone lesions identified. Pectus deformity of the included lower chest. IMPRESSION: 1. Examination is very limited by lack of intravenous contrast and breath motion artifact. 2. Numerous gallstones in the gallbladder with severe thickening and inflammatory change about the gallbladder wall, with what appear to be multiple ulcerations of the gallbladder wall. Findings are highly concerning for acute cholecystitis. There appears to be a gallstone near the gallbladder neck, which may be obstructing. 3. Subtly T1 and T2 hypointense, small mass of the central pancreatic head is again noted, difficult to accurately measure given lack of intravenous contrast and small size but not obviously changed compared to prior examinations. This remains consistent with small pancreatic adenocarcinoma. 4. Intra and extrahepatic biliary ductal dilatation, with abrupt obstruction of the central common bile duct in the pancreatic head. A common bile duct stent appears to be present but is poorly imaged by MR. 5. Marked postobstructive scarring and volume loss of the left lobe of the liver, similar to prior examination. 6. Moderate bilateral hydronephrosis, slightly increased compared to prior examination. Distal ureters and bladder are not imaged. Consider additional imaging of the pelvis given reported history of bladder cancer. Electronically  Signed   By: Eddie Candle M.D.   On: 04/06/2020 16:16   MR 3D Recon At Scanner  Result Date: 04/06/2020 CLINICAL DATA:  Pancreatic cancer, bladder cancer, assess treatment response, EXAM: MRI ABDOMEN WITHOUT CONTRAST TECHNIQUE: Multiplanar multisequence MR imaging was performed without the administration of intravenous contrast. Patient declined contrast administration. COMPARISON:  CT abdomen pelvis, 12/10/2019, MR abdomen, 09/04/2019 FINDINGS: Examination is generally limited throughout by breath motion  artifact and lack of intravenous contrast. Lower chest: No acute findings. Hepatobiliary: There is intra and extrahepatic biliary ductal dilatation, similar to prior examination, with abrupt obstruction of the central common bile duct in the pancreatic head. There appears to be significant scarring and volume loss of the left lobe of the liver. A common bile duct stent appears to be present but is poorly imaged by MR. Numerous gallstones in the gallbladder with severe thickening and inflammatory change about the gallbladder wall, with what appear to be multiple ulcerations of the gallbladder wall (series 10, image 70, 62). There appears to be a gallstone near the gallbladder neck (series 4, image 24). Pancreas: Subtly T1 and T2 hypointense, small mass of the central pancreatic head is again noted, difficult to accurately measure given lack of intravenous contrast and small size but not obviously changed compared to prior examinations (series 10, image 71). Inflammatory changes, or other parenchymal abnormality identified. Spleen:  Within normal limits in size and appearance. Adrenals/Urinary Tract: Atrophic appearance of the bilateral kidneys, with moderate bilateral hydronephrosis. Stomach/Bowel: Visualized portions within the abdomen are unremarkable. Vascular/Lymphatic: No pathologically enlarged lymph nodes identified. No abdominal aortic aneurysm demonstrated. Other:  None. Musculoskeletal: No suspicious bone lesions identified. Pectus deformity of the included lower chest. IMPRESSION: 1. Examination is very limited by lack of intravenous contrast and breath motion artifact. 2. Numerous gallstones in the gallbladder with severe thickening and inflammatory change about the gallbladder wall, with what appear to be multiple ulcerations of the gallbladder wall. Findings are highly concerning for acute cholecystitis. There appears to be a gallstone near the gallbladder neck, which may be obstructing. 3. Subtly T1  and T2 hypointense, small mass of the central pancreatic head is again noted, difficult to accurately measure given lack of intravenous contrast and small size but not obviously changed compared to prior examinations. This remains consistent with small pancreatic adenocarcinoma. 4. Intra and extrahepatic biliary ductal dilatation, with abrupt obstruction of the central common bile duct in the pancreatic head. A common bile duct stent appears to be present but is poorly imaged by MR. 5. Marked postobstructive scarring and volume loss of the left lobe of the liver, similar to prior examination. 6. Moderate bilateral hydronephrosis, slightly increased compared to prior examination. Distal ureters and bladder are not imaged. Consider additional imaging of the pelvis given reported history of bladder cancer. Electronically Signed   By: Eddie Candle M.D.   On: 04/06/2020 16:16   Anti-infectives (From admission, onward)   Start     Dose/Rate Route Frequency Ordered Stop   04/06/20 2200  sulfamethoxazole-trimethoprim (BACTRIM DS) 800-160 MG per tablet 1 tablet  Status:  Discontinued        1 tablet Oral Every 12 hours 04/06/20 1728 04/06/20 1729   04/06/20 2200  cephALEXin (KEFLEX) capsule 250 mg  Status:  Discontinued        250 mg Oral Every 12 hours 04/06/20 1729 04/06/20 1741   04/06/20 1830  cefTRIAXone (ROCEPHIN) 2 g in sodium chloride 0.9 % 100 mL IVPB  2 g 200 mL/hr over 30 Minutes Intravenous Every 24 hours 04/06/20 1741     04/06/20 1830  metroNIDAZOLE (FLAGYL) IVPB 500 mg        500 mg 100 mL/hr over 60 Minutes Intravenous Every 8 hours 04/06/20 1741     04/06/20 0000  sulfamethoxazole-trimethoprim (BACTRIM DS) 800-160 MG tablet  Status:  Discontinued        1 tablet Oral Every 12 hours 04/06/20 1728 04/06/20    04/06/20 0000  cephALEXin (KEFLEX) 250 MG capsule        250 mg Oral Every 12 hours 04/06/20 1730 04/11/20 2359   04/02/20 2200  cefTRIAXone (ROCEPHIN) 1 g in sodium chloride 0.9 %  100 mL IVPB        1 g 200 mL/hr over 30 Minutes Intravenous Every 24 hours 04/02/20 2016 04/04/20 2154   04/02/20 1415  aztreonam (AZACTAM) 1 g in sodium chloride 0.9 % 100 mL IVPB        1 g 200 mL/hr over 30 Minutes Intravenous  Once 04/02/20 1413 04/02/20 1514        Assessment/Plan Seizures HTN Hypothyroidism CKD Chronic anemia Bladder cancer s/p cystectomy with urinary diversion BMI 15 Chronic anemia New onset atrial fibrillation not on AC   Localized pancreatic head adenocarcinoma  - s/p stereotactic radiosurgery 01/2020 - followed by Dr. Marin Olp - CBD stricture s/p ERCP with stent placement 08/2019 - GI discussing timing of stent exchange  Acute calculus cholecystitis - We discussed cholecystitis and the different treatment options including antibiotics alone, percutaneous cholecystostomy tube placement, and cholecystectomy. Patient is high risk for surgical intervention due to her age as well as multiple medical problems including atrial fibrillation, malnutrition, and pancreatic cancer. We will likely recommend against surgery in this case. She completed 3 days of rocephin for a suspected UTI this admission, and was started on rocephin/flagyl last night. Her WBC remains elevated at 19.8 despite the antibiotics and she is still tender on abdominal exam. Will ask IR to place cholecystostomy tube. We will follow.  ID - rocephin 10/22>>10/24 and 10/26>>, flagyl 10/26>> VTE - SCDs, per primary FEN - FLD, Boost Foley - none Follow up - TBD  [place], [facility] Surgery 04/07/2020, 11:42 AM Please see Amion for pager number during day hours 7:00am-4:30pm

## 2020-04-07 NOTE — Progress Notes (Signed)
Physical Therapy Treatment Patient Details Name: Glenda King MRN: [id number] DOB: [birth year] Today's Date: 04/07/2020    History of Present Illness 86 y.o. female with medical history significant of  Pancreatic Ca seizure episode, HTN, hypothyroidism, sp cystectomy now self catheterizes 3-4 times a day. Pt admitted with fatigue, dizziness, a fall. Dx of new afib, sepsis, UTI.    PT Comments    Pt ambulated 200' with RW, no loss of balance. She tolerated increased ambulation distance and had improved balance today. Reviewed independent LE exercises for strengthening. Pt is progressing well with mobility.    Follow Up Recommendations  Home health PT     Equipment Recommendations  Rolling walker with 5" wheels    Recommendations for Other Services       Precautions / Restrictions Precautions Precautions: Fall Precaution Comments: pt denies falls in past 6 months Restrictions Weight Bearing Restrictions: No    Mobility  Bed Mobility Overal bed mobility: Modified Independent Bed Mobility: Supine to Sit     Supine to sit: Modified independent (Device/Increase time)        Transfers Overall transfer level: Independent Equipment used: None Transfers: Sit to/from Stand Sit to Stand: Independent            Ambulation/Gait Ambulation/Gait assistance: Supervision Gait Distance (Feet): 200 Feet Assistive device: Rolling walker (2 wheeled) Gait Pattern/deviations: Step-through pattern;Decreased stride length Gait velocity: WFL   General Gait Details: steady with RW, no loss of balance   Stairs             Wheelchair Mobility    Modified Rankin (Stroke Patients Only)       Balance Overall balance assessment: Modified Independent           Standing balance-Leahy Scale: Fair Standing balance comment: steady with RW for dynamic standing                            Cognition Arousal/Alertness: Awake/alert Behavior During  Therapy: WFL for tasks assessed/performed Overall Cognitive Status: Within Functional Limits for tasks assessed                                        Exercises General Exercises - Lower Extremity Ankle Circles/Pumps: AROM;Both;10 reps;Seated Hip Flexion/Marching: AROM;Both;10 reps;Seated    General Comments        Pertinent Vitals/Pain Pain Assessment: No/denies pain    Home Living                      Prior Function            PT Goals (current goals can now be found in the care plan section) Acute Rehab PT Goals Patient Stated Goal: to return home, go to mass daily PT Goal Formulation: With patient Time For Goal Achievement: 04/17/20 Potential to Achieve Goals: Good Progress towards PT goals: Progressing toward goals    Frequency    Min 3X/week      PT Plan Current plan remains appropriate    Co-evaluation              AM-PAC PT "6 Clicks" Mobility   Outcome Measure  Help needed turning from your back to your side while in a flat bed without using bedrails?: None Help needed moving from lying on your back to sitting on the side of a flat bed  without using bedrails?: A Little Help needed moving to and from a bed to a chair (including a wheelchair)?: None Help needed standing up from a chair using your arms (e.g., wheelchair or bedside chair)?: None Help needed to walk in hospital room?: None Help needed climbing 3-5 steps with a railing? : A Little 6 Click Score: 22    End of Session Equipment Utilized During Treatment: Gait belt Activity Tolerance: Patient tolerated treatment well Patient left: in chair;with call bell/phone within reach;with chair alarm set Nurse Communication: Mobility status PT Visit Diagnosis: Difficulty in walking, not elsewhere classified (R26.2)     Time: 7867-5449 PT Time Calculation (min) (ACUTE ONLY): 20 min  Charges:  $Gait Training: 8-22 mins                     Blondell Reveal  [facility] PT 04/07/2020  Acute Rehabilitation Services Pager [phone] Office [phone]

## 2020-04-07 NOTE — Progress Notes (Signed)
I think that we have found the cause for the pain that she is having.  The MRI shows that she has what appears to be acute cholecystitis.  She has numerous gallstones in the gallbladder.  There is severe thickening of the gallbladder wall.  She does not have metastatic pancreatic cancer.  She has a mass in the central pancreatic head.  It is hard to measure the size of the mass.  I just wonder if she might be a candidate for cholecystectomy.  I realize that she is 86 years old.  She di is d not have the best nutritional state.  However, I fear that she will have further problems with gallstones.  She still is not all that hungry.  I will know if the gallbladder issue is affecting this.  She does not complain of any pain.  I put on little bit of morphine elixir.  Hopefully this is helping her.  Her labs show white cell count 19.8.  Hemoglobin 10.4.  Platelet count 159,000.  Her sodium is 134.  Potassium 4.1.  BUN 50 creatinine 1.61.  Her bilirubin is 1.1.  Albumin is 2.2.  Her vital signs show temperature 98.4.  Pulse 60.  Blood pressure 102/51.  Her abdominal exam is soft.  Bowel sounds are present.  There is no hepatomegaly.  There is no fluid wave.  There may be some tenderness to palpation over the right upper quadrant.  Her lungs are clear.  Cardiac exam regular rate and rhythm.  Ms. Mccaig has the localized pancreatic cancer.  She has had radiosurgery for this.  It appears of the radiosurgery has been effective.  There is no growth that we can tell on the MRI.  I guess the real issue now is the gall stones and gallbladder thickening.  Sound like she has cholecystitis.  Ideally, this would be taken care of surgically.  I know that there are quite a few factors that come into play regarding her going to surgery.  I very much appreciate everybody's help with Ms. Brandle.  She is was had a great attitude.  I know she is thankful for the compassion that the staff on [facility] are showing.  Lattie Haw, MD  1 Chronicles 16:34

## 2020-04-08 ENCOUNTER — Inpatient Hospital Stay (HOSPITAL_COMMUNITY): Payer: Medicare Other

## 2020-04-08 DIAGNOSIS — C259 Malignant neoplasm of pancreas, unspecified: Secondary | ICD-10-CM | POA: Diagnosis not present

## 2020-04-08 DIAGNOSIS — K831 Obstruction of bile duct: Secondary | ICD-10-CM | POA: Diagnosis not present

## 2020-04-08 DIAGNOSIS — I4891 Unspecified atrial fibrillation: Secondary | ICD-10-CM | POA: Diagnosis not present

## 2020-04-08 HISTORY — PX: IR PERC CHOLECYSTOSTOMY: IMG2326

## 2020-04-08 LAB — CULTURE, BLOOD (ROUTINE X 2)
Culture: NO GROWTH
Culture: NO GROWTH
Special Requests: ADEQUATE
Special Requests: ADEQUATE

## 2020-04-08 LAB — CBC
HCT: 30.6 % — ABNORMAL LOW (ref 36.0–46.0)
Hemoglobin: 10 g/dL — ABNORMAL LOW (ref 12.0–15.0)
MCH: 32.5 pg (ref 26.0–34.0)
MCHC: 32.7 g/dL (ref 30.0–36.0)
MCV: 99.4 fL (ref 80.0–100.0)
Platelets: 173 10*3/uL (ref 150–400)
RBC: 3.08 MIL/uL — ABNORMAL LOW (ref 3.87–5.11)
RDW: 13.8 % (ref 11.5–15.5)
WBC: 17.2 10*3/uL — ABNORMAL HIGH (ref 4.0–10.5)
nRBC: 0 % (ref 0.0–0.2)

## 2020-04-08 LAB — COMPREHENSIVE METABOLIC PANEL
ALT: 24 U/L (ref 0–44)
AST: 17 U/L (ref 15–41)
Albumin: 2 g/dL — ABNORMAL LOW (ref 3.5–5.0)
Alkaline Phosphatase: 168 U/L — ABNORMAL HIGH (ref 38–126)
Anion gap: 9 (ref 5–15)
BUN: 52 mg/dL — ABNORMAL HIGH (ref 8–23)
CO2: 18 mmol/L — ABNORMAL LOW (ref 22–32)
Calcium: 8.2 mg/dL — ABNORMAL LOW (ref 8.9–10.3)
Chloride: 108 mmol/L (ref 98–111)
Creatinine, Ser: 1.68 mg/dL — ABNORMAL HIGH (ref 0.44–1.00)
GFR, Estimated: 29 mL/min — ABNORMAL LOW (ref >60)
Glucose, Bld: 160 mg/dL — ABNORMAL HIGH (ref 70–99)
Potassium: 4.3 mmol/L (ref 3.5–5.1)
Sodium: 135 mmol/L (ref 135–145)
Total Bilirubin: 0.8 mg/dL (ref 0.3–1.2)
Total Protein: 5.7 g/dL — ABNORMAL LOW (ref 6.5–8.1)

## 2020-04-08 LAB — MAGNESIUM: Magnesium: 2 mg/dL (ref 1.7–2.4)

## 2020-04-08 LAB — PHOSPHORUS: Phosphorus: 3.7 mg/dL (ref 2.5–4.6)

## 2020-04-08 LAB — PROTIME-INR
INR: 1.4 — ABNORMAL HIGH (ref 0.8–1.2)
Prothrombin Time: 16.5 seconds — ABNORMAL HIGH (ref 11.4–15.2)

## 2020-04-08 IMAGING — XA IR CHOLECYSTOSTOMY
3 series · 6 of 6 positions shown · non-contrast
Comparison: none

INDICATION: 89-year-old female with acute calculus cholecystitis. She is not an
operative candidate and therefore presents for placement of a
percutaneous cholecystostomy tube.

[Series 1: (id) · 1 of 1 slices shown]
[im 1/1]
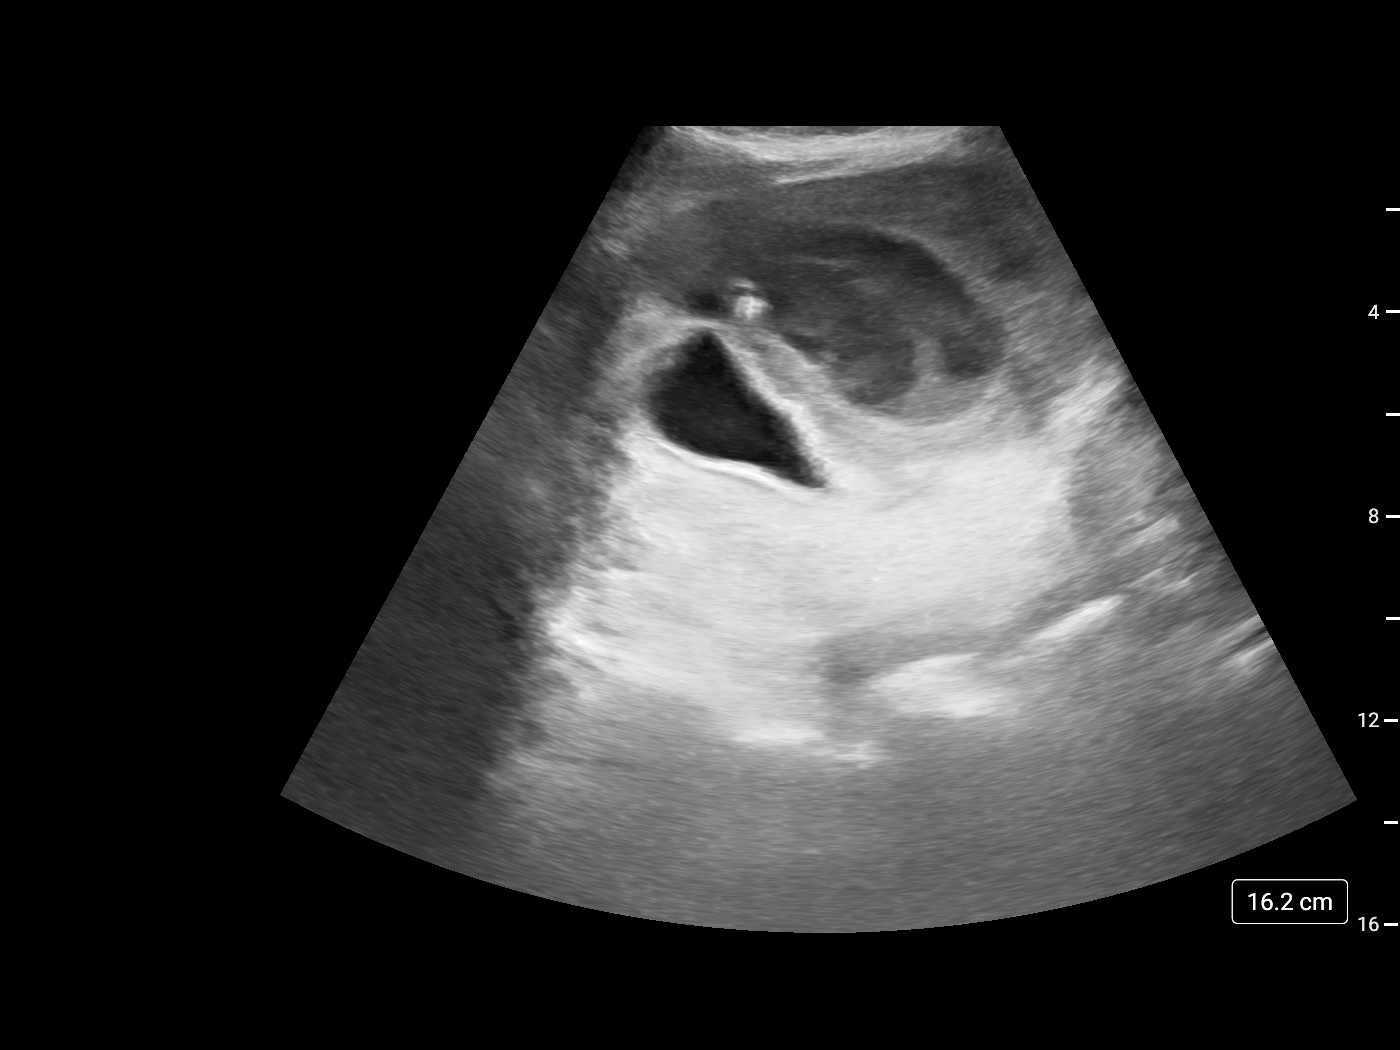

[Series 1: fl - angio · 4 of 19 frames shown]
[frame 3/19]
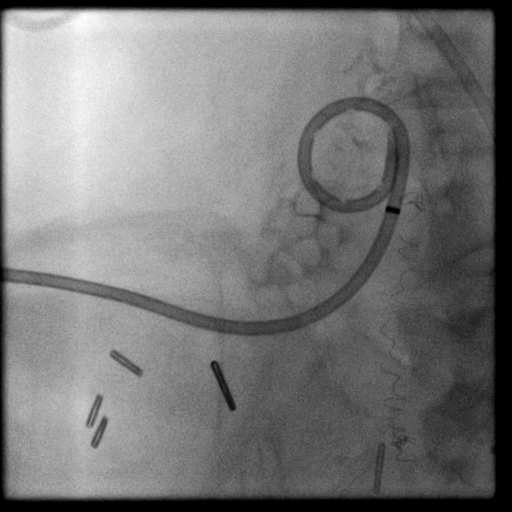
[frame 4/19]
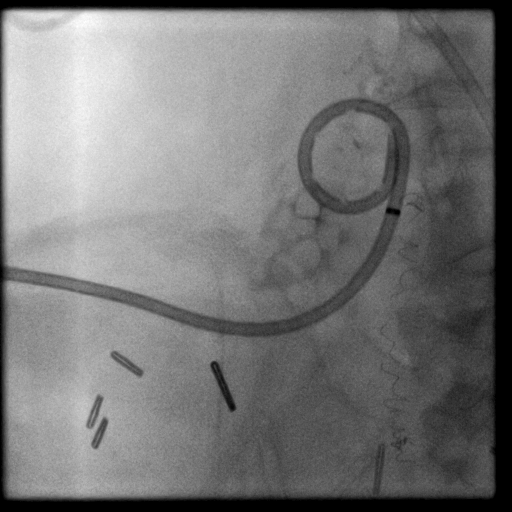
[frame 10/19]
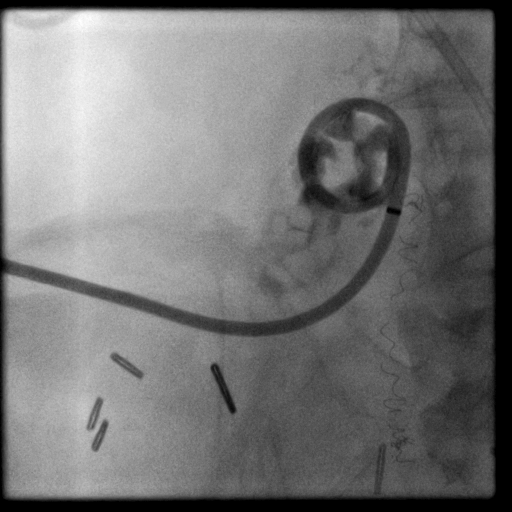
[frame 17/19]
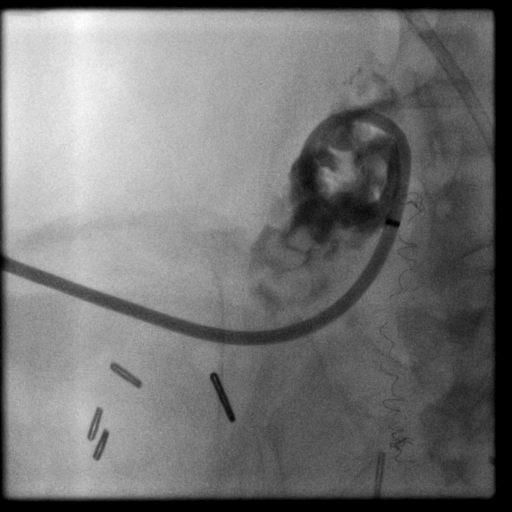

[Series 300: tube placements · 1 of 1 slices shown]
[im 1/1]
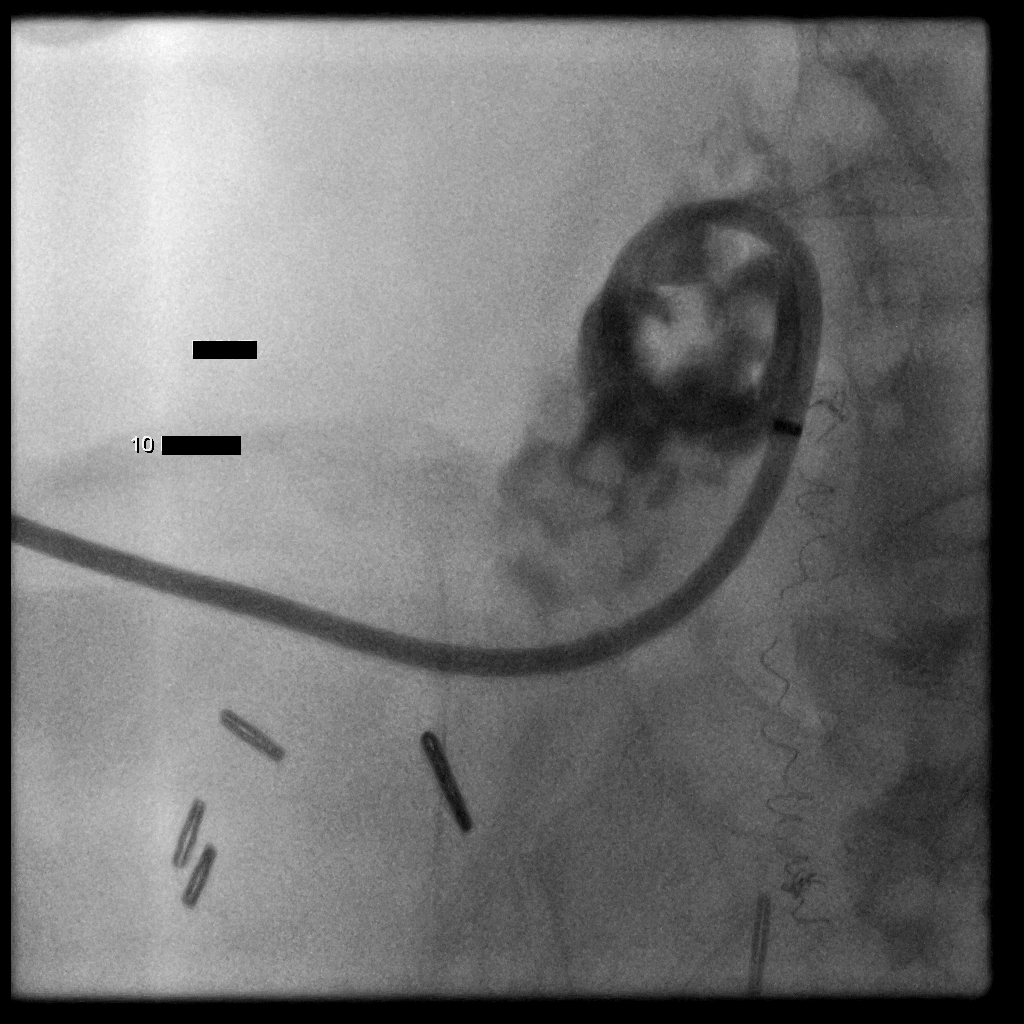

[6 of 6 positions shown; findings below may reference images not displayed]

EXAM:
CHOLECYSTOSTOMY

MEDICATIONS:
1 g vancomycin; The antibiotic was administered within an
appropriate time frame prior to the initiation of the procedure.

ANESTHESIA/SEDATION:
Moderate (conscious) sedation was employed during this procedure. A
total of Versed 0.5 mg and Fentanyl 25 mcg and 0.5 mg Dilaudid was
administered intravenously.

Moderate Sedation Time: 11 minutes. The patient's level of
consciousness and vital signs were monitored continuously by
radiology nursing throughout the procedure under my direct
supervision.

FLUOROSCOPY TIME:  Fluoroscopy Time: 0 minutes 54 seconds (9 mGy).

COMPLICATIONS:
None immediate.

PROCEDURE:
Informed written consent was obtained from the patient after a
thorough discussion of the procedural risks, benefits and
alternatives. All questions were addressed. Maximal Sterile Barrier
Technique was utilized including caps, mask, sterile gowns, sterile
gloves, sterile drape, hand hygiene and skin antiseptic. A timeout
was performed prior to the initiation of the procedure.

The right upper quadrant was interrogated with ultrasound. The
gallbladder is distended thick walled contains multiple echogenic
foci consistent with stones. A suitable skin entry site was selected
and marked. Local anesthesia was attained by infiltration with 1%
lidocaine. A small dermatotomy was made. Under real-time ultrasound
guidance, a 21 gauge Accustick needle was advanced along a short
transhepatic tract and into the gallbladder lumen. A 0.018 wire was
then coiled in the gallbladder lumen. The needle was exchanged for
the Accustick sheath dilator which was advanced into the gallbladder
lumen. The 0.018 wire was then exchanged for a 75 cm superstiff
Amplatz wire. The Accustick sheath was then removed and the skin and
transhepatic tract dilated to 10 French. NOSA 10.2 French
all-purpose drainage catheter was then advanced over the wire and
formed in the gallbladder lumen. Aspiration yields frankly purulent
bile. A sample was sent for Gram stain and culture. A gentle hand
injection of contrast material was then performed opacifying the
gallbladder lumen. Multiple faceted filling defects are present
consistent with cholelithiasis.

The tube was gently flushed with saline, connected to gravity bag
drainage and secured to the skin with 0 Prolene suture. The patient
tolerated the procedure well.
IMPRESSION: Successful placement of a transhepatic percutaneous cholecystostomy
tube for a diagnosis of acute calculus cholecystitis.

PLAN:
1. Maintain drain to gravity bag.
2. Flush at least once per shift.
3. Cultures are pending from purulent bile.

## 2020-04-08 MED ORDER — LIDOCAINE HCL (PF) 1 % IJ SOLN
INTRAMUSCULAR | Status: AC | PRN
  Administered 2020-04-08: 5 mL via INTRADERMAL

## 2020-04-08 MED ORDER — SODIUM CHLORIDE 0.9% FLUSH
5.0000 mL | Freq: Three times a day (TID) | INTRAVENOUS | Status: DC
  Administered 2020-04-08 – 2020-04-13 (×13): 5 mL

## 2020-04-08 MED ORDER — HYDROMORPHONE HCL 1 MG/ML IJ SOLN
INTRAMUSCULAR | Status: AC | PRN
Start: 2020-04-08 — End: 2020-04-08
  Administered 2020-04-08: 0.5 mg via INTRAVENOUS

## 2020-04-08 MED ORDER — FENTANYL CITRATE (PF) 100 MCG/2ML IJ SOLN
INTRAMUSCULAR | Status: AC
  Filled 2020-04-08: qty 2

## 2020-04-08 MED ORDER — FENTANYL CITRATE (PF) 100 MCG/2ML IJ SOLN
INTRAMUSCULAR | Status: AC | PRN
Start: 2020-04-08 — End: 2020-04-08
  Administered 2020-04-08: 25 ug via INTRAVENOUS

## 2020-04-08 MED ORDER — HYDROMORPHONE HCL 1 MG/ML IJ SOLN
INTRAMUSCULAR | Status: AC
  Filled 2020-04-08: qty 1

## 2020-04-08 MED ORDER — LIDOCAINE HCL 1 % IJ SOLN
INTRAMUSCULAR | Status: AC
  Filled 2020-04-08: qty 20

## 2020-04-08 MED ORDER — VANCOMYCIN HCL IN DEXTROSE 1-5 GM/200ML-% IV SOLN: 1000.0000 mg | Freq: Once | INTRAVENOUS | Status: AC

## 2020-04-08 MED ORDER — VANCOMYCIN HCL IN DEXTROSE 1-5 GM/200ML-% IV SOLN
INTRAVENOUS | Status: AC
  Administered 2020-04-08: 1000 mg via INTRAVENOUS
  Filled 2020-04-08: qty 200

## 2020-04-08 MED ORDER — MIDAZOLAM HCL 2 MG/2ML IJ SOLN
INTRAMUSCULAR | Status: AC | PRN
  Administered 2020-04-08: 0.5 mg via INTRAVENOUS

## 2020-04-08 MED ORDER — IOHEXOL 300 MG/ML  SOLN
50.0000 mL | Freq: Once | INTRAMUSCULAR | Status: AC | PRN
  Administered 2020-04-08: 10 mL

## 2020-04-08 MED ORDER — MIDAZOLAM HCL 2 MG/2ML IJ SOLN
INTRAMUSCULAR | Status: AC
  Filled 2020-04-08: qty 2

## 2020-04-08 NOTE — Plan of Care (Signed)

## 2020-04-08 NOTE — Progress Notes (Signed)
MEDICATION-RELATED CONSULT NOTE   IR Procedure Consult - Anticoagulant/Antiplatelet PTA/Inpatient Med List Review by Pharmacist    Procedure: IR Perc cholecystostomy    Completed: 7129  Post-Procedural bleeding risk per IR MD assessment:  standard  Antithrombotic medications on inpatient or PTA profile prior to procedure:   none. Started on Eliquis initially for new-onset atrial fibrillation but stopped 10/25 as atrial fibrillation thought to be due to anemia.   "New onset A. fib: Currently sinus rhythm.  Rate well controlled on metoprolol.  Echocardiogram was normal.  Initially started on anticoagulation, however per oncology's recommendation, high risk of bleeding so this was discontinued.  Currently remains stable." - per [facility] note    Recommended restart time per IR Post-Procedure Guidelines:  N/A   Other considerations:      Plan:    [place], PharmD, [place]

## 2020-04-08 NOTE — Progress Notes (Addendum)
Patient ID: Glenda King, female   DOB: 1929-04-03, 86 y.o.   MRN: [id number]    Progress Note   Subjective  Day # 5  CC; localized pancreatic adenocarcinoma, acute cholecystitis  Rocephin and metronidazole IV  Patient is for cholecystostomy tube placement today   INR 1.4 WBC 17.2/hemoglobin 10 T bili 0.8/alk phos 168/AST 17/ALT 24  Pain control is adequate, says she is only hurting if someone presses on her abdomen.  No nausea or vomiting.  Frustrated with waiting for cholecystostomy tube placement.  Low-grade temp last night ninety-nine four.  Patient mentioning she thinks she can go home after the cholecystostomy today.  Explained that she still needs to be in the hospital for IV antibiotics etc.     Objective   Vital signs in last 24 hours: Temp:  [97.7 F (36.5 C)-99.4 F (37.4 C)] 99.4 F (37.4 C) (10/28 0604) Pulse Rate:  [63-65] 65 (10/28 0604) Resp:  [16-18] 16 (10/27 2131) BP: (95-101)/(49-53) 101/49 (10/28 0604) SpO2:  [95 %-99 %] 95 % (10/28 0604) Last BM Date: 04/02/20 General:   Elderly white female in NAD, irritable this a.m. Heart:  Regular rate and rhythm; no murmurs Lungs: Respirations even and unlabored, lungs CTA bilaterally Abdomen:  Soft, tender right upper quadrant,. Normal bowel sounds. Extremities:  Without edema. Neurologic:  Alert and oriented,  grossly normal neurologically. Psych:  Cooperative. Normal mood and affect.   Lab Results: Recent Labs    04/06/20 0427 04/07/20 0406 04/08/20 0330  WBC 19.5* 19.8* 17.2*  HGB 11.2* 10.4* 10.0*  HCT 33.4* 32.0* 30.6*  PLT 131* 159 173   BMET Recent Labs    04/06/20 0427 04/07/20 0406 04/08/20 0330  NA 136 134* 135  K 3.3* 4.1 4.3  CL 105 107 108  CO2 20* 18* 18*  GLUCOSE 110* 99 160*  BUN 53* 50* 52*  CREATININE 1.73* 1.61* 1.68*  CALCIUM 8.2* 8.4* 8.2*   LFT Recent Labs    04/08/20 0330  PROT 5.7*  ALBUMIN 2.0*  AST 17  ALT 24  ALKPHOS 168*  BILITOT 0.8    PT/INR Recent Labs    04/07/20 1154 04/08/20 0330  LABPROT 14.4 16.5*  INR 1.2 1.4*    Studies/Results: MR ABDOMEN WO CONTRAST  Result Date: 04/06/2020 CLINICAL DATA:  Pancreatic cancer, bladder cancer, assess treatment response, EXAM: MRI ABDOMEN WITHOUT CONTRAST TECHNIQUE: Multiplanar multisequence MR imaging was performed without the administration of intravenous contrast. Patient declined contrast administration. COMPARISON:  CT abdomen pelvis, 12/10/2019, MR abdomen, 09/04/2019 FINDINGS: Examination is generally limited throughout by breath motion artifact and lack of intravenous contrast. Lower chest: No acute findings. Hepatobiliary: There is intra and extrahepatic biliary ductal dilatation, similar to prior examination, with abrupt obstruction of the central common bile duct in the pancreatic head. There appears to be significant scarring and volume loss of the left lobe of the liver. A common bile duct stent appears to be present but is poorly imaged by MR. Numerous gallstones in the gallbladder with severe thickening and inflammatory change about the gallbladder wall, with what appear to be multiple ulcerations of the gallbladder wall (series 10, image 70, 62). There appears to be a gallstone near the gallbladder neck (series 4, image 24). Pancreas: Subtly T1 and T2 hypointense, small mass of the central pancreatic head is again noted, difficult to accurately measure given lack of intravenous contrast and small size but not obviously changed compared to prior examinations (series 10, image 71). Inflammatory changes, or other parenchymal  abnormality identified. Spleen:  Within normal limits in size and appearance. Adrenals/Urinary Tract: Atrophic appearance of the bilateral kidneys, with moderate bilateral hydronephrosis. Stomach/Bowel: Visualized portions within the abdomen are unremarkable. Vascular/Lymphatic: No pathologically enlarged lymph nodes identified. No abdominal aortic aneurysm  demonstrated. Other:  None. Musculoskeletal: No suspicious bone lesions identified. Pectus deformity of the included lower chest. IMPRESSION: 1. Examination is very limited by lack of intravenous contrast and breath motion artifact. 2. Numerous gallstones in the gallbladder with severe thickening and inflammatory change about the gallbladder wall, with what appear to be multiple ulcerations of the gallbladder wall. Findings are highly concerning for acute cholecystitis. There appears to be a gallstone near the gallbladder neck, which may be obstructing. 3. Subtly T1 and T2 hypointense, small mass of the central pancreatic head is again noted, difficult to accurately measure given lack of intravenous contrast and small size but not obviously changed compared to prior examinations. This remains consistent with small pancreatic adenocarcinoma. 4. Intra and extrahepatic biliary ductal dilatation, with abrupt obstruction of the central common bile duct in the pancreatic head. A common bile duct stent appears to be present but is poorly imaged by MR. 5. Marked postobstructive scarring and volume loss of the left lobe of the liver, similar to prior examination. 6. Moderate bilateral hydronephrosis, slightly increased compared to prior examination. Distal ureters and bladder are not imaged. Consider additional imaging of the pelvis given reported history of bladder cancer. Electronically Signed   By: Eddie Candle M.D.   On: 04/06/2020 16:16   MR 3D Recon At Scanner  Result Date: 04/06/2020 CLINICAL DATA:  Pancreatic cancer, bladder cancer, assess treatment response, EXAM: MRI ABDOMEN WITHOUT CONTRAST TECHNIQUE: Multiplanar multisequence MR imaging was performed without the administration of intravenous contrast. Patient declined contrast administration. COMPARISON:  CT abdomen pelvis, 12/10/2019, MR abdomen, 09/04/2019 FINDINGS: Examination is generally limited throughout by breath motion artifact and lack of  intravenous contrast. Lower chest: No acute findings. Hepatobiliary: There is intra and extrahepatic biliary ductal dilatation, similar to prior examination, with abrupt obstruction of the central common bile duct in the pancreatic head. There appears to be significant scarring and volume loss of the left lobe of the liver. A common bile duct stent appears to be present but is poorly imaged by MR. Numerous gallstones in the gallbladder with severe thickening and inflammatory change about the gallbladder wall, with what appear to be multiple ulcerations of the gallbladder wall (series 10, image 70, 62). There appears to be a gallstone near the gallbladder neck (series 4, image 24). Pancreas: Subtly T1 and T2 hypointense, small mass of the central pancreatic head is again noted, difficult to accurately measure given lack of intravenous contrast and small size but not obviously changed compared to prior examinations (series 10, image 71). Inflammatory changes, or other parenchymal abnormality identified. Spleen:  Within normal limits in size and appearance. Adrenals/Urinary Tract: Atrophic appearance of the bilateral kidneys, with moderate bilateral hydronephrosis. Stomach/Bowel: Visualized portions within the abdomen are unremarkable. Vascular/Lymphatic: No pathologically enlarged lymph nodes identified. No abdominal aortic aneurysm demonstrated. Other:  None. Musculoskeletal: No suspicious bone lesions identified. Pectus deformity of the included lower chest. IMPRESSION: 1. Examination is very limited by lack of intravenous contrast and breath motion artifact. 2. Numerous gallstones in the gallbladder with severe thickening and inflammatory change about the gallbladder wall, with what appear to be multiple ulcerations of the gallbladder wall. Findings are highly concerning for acute cholecystitis. There appears to be a gallstone near the gallbladder neck,  which may be obstructing. 3. Subtly T1 and T2 hypointense,  small mass of the central pancreatic head is again noted, difficult to accurately measure given lack of intravenous contrast and small size but not obviously changed compared to prior examinations. This remains consistent with small pancreatic adenocarcinoma. 4. Intra and extrahepatic biliary ductal dilatation, with abrupt obstruction of the central common bile duct in the pancreatic head. A common bile duct stent appears to be present but is poorly imaged by MR. 5. Marked postobstructive scarring and volume loss of the left lobe of the liver, similar to prior examination. 6. Moderate bilateral hydronephrosis, slightly increased compared to prior examination. Distal ureters and bladder are not imaged. Consider additional imaging of the pelvis given reported history of bladder cancer. Electronically Signed   By: Eddie Candle M.D.   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  Patient completed stereotactic radiosurgery August 2021.  Now with acute cholecystitis, multiple gallstones, cystic duct stone and chronically dilated intra and extrahepatic biliary tree on MRI.  Biliary stent in place, no CBD stones noted.  Patient is on Rocephin and metronidazole. Surgery has consulted and agreed that cholecystostomy tube placement is patient's best option.  IR to place cholecystostomy tube later this afternoon.  From GI perspective may be best to do ERCP and stent placement while she is still in the hospital, but would allow the acute cholecystitis to cool down first and probably aim for early next week/Monday or Tuesday.  She does not have evidence of cholangitis.   LOS: 6 days   Amy Esterwood PA-C 04/08/2020, 8:53 AM  I have seen and evaluated the patient as well.  I agree with the above.  Her cholecystostomy tube is in.  Gallbladder  full of stones.  I do not think her biliary stent is causing problems but the length of time it is in there increases the chance of that.  We will determine an appropriate time for ERCP and stent change.  Gatha Mayer, MD, [facility] 04/08/2020 6:04 PM

## 2020-04-08 NOTE — Care Management Important Message (Signed)
Important Message  Patient Details IM Letter given to the Patient Name: Glenda King MRN: [id number] Date of Birth: 05-27-1929   Medicare Important Message Given:  Yes     Kerin Salen 04/08/2020, 10:35 AM

## 2020-04-08 NOTE — Progress Notes (Signed)
PROGRESS NOTE    Glenda King  HKV:[id number] DOB: 1929/06/05 DOA: 04/02/2020 PCP: Javier Glazier, MD    Brief Narrative:  86 year old female with history of localized pancreatic cancer status post radiation treatment, seizures, hypertension and hypothyroidism.  Admitted to the hospital with 3 days of fatigue, generalized weakness, lightheadedness and dizziness.  On the day of admission she was seen at oncologist office, found to have A. fib with RVR with heart rate more than 130 and referred to the emergency room.  Patient was treated with diltiazem infusion and IV heparin and admitted to the hospital. 10/26, patient was seen by physical therapy and she was discharged to a skilled nursing facility.  She was about to leave the hospital.  A repeat surveillance MRI of the abdomen was done that showed acute cholecystitis, hence discharge was canceled and surgery was consulted.  She did have persistent leukocytosis.   Assessment & Plan:   Principal Problem:   New onset a-fib [facility]) Active Problems:   Malignant neoplasm of bladder, unspecified (HCC)   Hypothyroidism   Chronic kidney disease (CKD) stage G4/A1, severely decreased glomerular filtration rate (GFR) between 15-29 mL/min/1.73 square meter and albuminuria creatinine ratio less than 30 mg/g (HCC)   History of seizure   Anemia in chronic renal disease   History of urinary diversion procedure   Primary pancreatic cancer (HCC)   Acute lower UTI   Elevated troponin   Protein-calorie malnutrition, severe  New onset A. fib: Currently sinus rhythm.  Rate well controlled on metoprolol.  Echocardiogram was normal.  Initially started on anticoagulation, however per oncology's recommendation, high risk of bleeding so this was discontinued.  Currently remains stable.  Acute calculus cholecystitis: With persistent leukocytosis and history of localized pancreatic cancer, patient underwent MRI of the abdomen that was positive for acute  calculus cholecystitis along with intra and extrahepatic biliary dilatation.  LFTs are normal. Seen by GI, she does have biliary stent, may need exchange. Seen by surgery and they recommended cholecystostomy tube placement, IR consulted. Remains on Rocephin and Flagyl and currently hemodynamically stable.  Will change to oral antibiotics for 14 days on discharge.  UTI: Suspected present on admission.  Blood cultures and urine cultures no growth.  Treated with Rocephin, now continued to cover for intra-abdominal infection.  CKD stage IV: Her creatinine at about baseline.  We will continue to monitor.  Hypokalemia/hypomagnesemia: Replaced and adequate.  Pancreatic cancer: Status post radiation.  Followed by Dr. Marin Olp.  Also has anemia of chronic disease. Received 2 units of PRBC while in the hospital. Received 1 unit of IV iron.  Seizure disorder: Currently well controlled on Keppra.  Severe protein calorie malnutrition: Nutrition Status: Nutrition Problem: Severe Malnutrition Etiology: chronic illness, cancer and cancer related treatments Signs/Symptoms: moderate fat depletion, moderate muscle depletion, severe fat depletion, severe muscle depletion Interventions: Boost Plus     DVT prophylaxis: SCDs   Code Status: DNR Family Communication: Unable to reach patient's sister-in-law, will try again. Disposition Plan: Status is: Inpatient  Remains inpatient appropriate because:IV treatments appropriate due to intensity of illness or inability to take PO and Inpatient level of care appropriate due to severity of illness   Dispo: The patient is from: Home, independent living              Anticipated d/c is to: SNF, or home with home health.              Anticipated d/c date is: 2 to 3 days.  Patient currently is not medically stable to d/c.  Plan for procedure today.         Consultants:   Gastroenterology  General surgery  Oncology  Interventional  radiology  Procedures:   None  Antimicrobials:  Anti-infectives (From admission, onward)   Start     Dose/Rate Route Frequency Ordered Stop   04/06/20 2200  sulfamethoxazole-trimethoprim (BACTRIM DS) 800-160 MG per tablet 1 tablet  Status:  Discontinued        1 tablet Oral Every 12 hours 04/06/20 1728 04/06/20 1729   04/06/20 2200  cephALEXin (KEFLEX) capsule 250 mg  Status:  Discontinued        250 mg Oral Every 12 hours 04/06/20 1729 04/06/20 1741   04/06/20 1830  cefTRIAXone (ROCEPHIN) 2 g in sodium chloride 0.9 % 100 mL IVPB        2 g 200 mL/hr over 30 Minutes Intravenous Every 24 hours 04/06/20 1741     04/06/20 1830  metroNIDAZOLE (FLAGYL) IVPB 500 mg        500 mg 100 mL/hr over 60 Minutes Intravenous Every 8 hours 04/06/20 1741     04/06/20 0000  sulfamethoxazole-trimethoprim (BACTRIM DS) 800-160 MG tablet  Status:  Discontinued        1 tablet Oral Every 12 hours 04/06/20 1728 04/06/20    04/06/20 0000  cephALEXin (KEFLEX) 250 MG capsule        250 mg Oral Every 12 hours 04/06/20 1730 04/11/20 2359   04/02/20 2200  cefTRIAXone (ROCEPHIN) 1 g in sodium chloride 0.9 % 100 mL IVPB        1 g 200 mL/hr over 30 Minutes Intravenous Every 24 hours 04/02/20 2016 04/04/20 2154   04/02/20 1415  aztreonam (AZACTAM) 1 g in sodium chloride 0.9 % 100 mL IVPB        1 g 200 mL/hr over 30 Minutes Intravenous  Once 04/02/20 1413 04/02/20 1514         Subjective: Patient seen and examined.  Complaining of dry mouth.  She has been n.p.o. since last night.  No abdominal pain at rest.  Denies any nausea vomiting.  She ambulated better than before.  Objective: Vitals:   04/07/20 1251 04/07/20 2131 04/08/20 0604 04/08/20 0943  BP: (!) 95/53 (!) 97/50 (!) 101/49 (!) 110/56  Pulse: 63 64 65 61  Resp: 18 16    Temp: 98.2 F (36.8 C) 97.7 F (36.5 C) 99.4 F (37.4 C)   TempSrc: Oral Oral Oral   SpO2: 97% 99% 95%   Weight:      Height:        Intake/Output Summary (Last 24 hours)  at 04/08/2020 1040 Last data filed at 04/08/2020 0600 Gross per 24 hour  Intake 220 ml  Output 1400 ml  Net -1180 ml   Filed Weights   04/02/20 2012  Weight: 44.4 kg    Examination:  General exam: Appears calm and comfortable  Chronically sick looking, she is cachectic but not in any distress. Respiratory system: Clear to auscultation. Respiratory effort normal. Cardiovascular system: S1 & S2 heard, RRR. No JVD, murmurs, rubs, gallops or clicks. No pedal edema. Gastrointestinal system: Mild right upper quadrant abdominal pain.  No rigidity or guarding.  Bowel sounds present.. Central nervous system: Alert and oriented. No focal neurological deficits. Extremities: Symmetric 5 x 5 power. Skin: No rashes, lesions or ulcers Psychiatry: Judgement and insight appear normal. Mood & affect appropriate.     Data Reviewed: I have personally  reviewed following labs and imaging studies  CBC: Recent Labs  Lab 04/02/20 1345 04/02/20 1345 04/03/20 0024 04/04/20 0515 04/06/20 0427 04/07/20 0406 04/08/20 0330  WBC 20.8*   < > 17.5* 16.4* 19.5* 19.8* 17.2*  NEUTROABS 19.0*  --  15.9* 15.0* 17.8* 18.2*  --   HGB 10.4*   < > 9.0* 8.1* 11.2* 10.4* 10.0*  HCT 31.9*   < > 27.0* 25.6* 33.4* 32.0* 30.6*  MCV 97.9   < > 97.1 101.2* 96.0 98.8 99.4  PLT 159   < > 132* 136* 131* 159 173   < > = values in this interval not displayed.   Basic Metabolic Panel: Recent Labs  Lab 04/02/20 1245 04/02/20 1308 04/03/20 0024 04/04/20 0515 04/06/20 0427 04/07/20 0406 04/08/20 0330  NA   < >  --  137 135 136 134* 135  K   < >  --  3.8 3.9 3.3* 4.1 4.3  CL   < >  --  105 106 105 107 108  CO2   < >  --  20* 20* 20* 18* 18*  GLUCOSE   < >  --  116* 105* 110* 99 160*  BUN   < >  --  41* 42* 53* 50* 52*  CREATININE   < >  --  1.62* 1.75* 1.73* 1.61* 1.68*  CALCIUM   < >  --  8.2* 8.2* 8.2* 8.4* 8.2*  MG  --  1.7 1.8  --   --   --  2.0  PHOS  --   --  3.3  --   --   --  3.7   < > = values in this  interval not displayed.   GFR: Estimated Creatinine Clearance: 16.2 mL/min (A) (by C-G formula based on SCr of 1.68 mg/dL (H)). Liver Function Tests: Recent Labs  Lab 04/02/20 1042 04/03/20 0024 04/07/20 0406 04/08/20 0330  AST 47* 32 18 17  ALT 121* 86* 26 24  ALKPHOS 270* 206* 167* 168*  BILITOT 3.4* 2.7* 1.1 0.8  PROT 7.8 6.3* 6.0* 5.7*  ALBUMIN 3.5 2.7* 2.2* 2.0*   Recent Labs  Lab 04/02/20 1145  LIPASE 44  AMYLASE 57   No results for input(s): AMMONIA in the last 168 hours. Coagulation Profile: Recent Labs  Lab 04/02/20 1308 04/07/20 1154 04/08/20 0330  INR 1.2 1.2 1.4*   Cardiac Enzymes: No results for input(s): CKTOTAL, CKMB, CKMBINDEX, TROPONINI in the last 168 hours. BNP (last 3 results) No results for input(s): PROBNP in the last 8760 hours. HbA1C: No results for input(s): HGBA1C in the last 72 hours. CBG: Recent Labs  Lab 04/03/20 1204 04/03/20 1712 04/06/20 1524  GLUCAP 136* 132* 106*   Lipid Profile: No results for input(s): CHOL, HDL, LDLCALC, TRIG, CHOLHDL, LDLDIRECT in the last 72 hours. Thyroid Function Tests: No results for input(s): TSH, T4TOTAL, FREET4, T3FREE, THYROIDAB in the last 72 hours. Anemia Panel: No results for input(s): VITAMINB12, FOLATE, FERRITIN, TIBC, IRON, RETICCTPCT in the last 72 hours. Sepsis Labs: Recent Labs  Lab 04/02/20 1308 04/02/20 1515 04/02/20 2056  PROCALCITON  --   --  6.26  LATICACIDVEN 2.1* 1.4  --     Recent Results (from the past 240 hour(s))  Culture, blood (single)     Status: None   Collection Time: 04/02/20  1:05 PM   Specimen: BLOOD LEFT ARM  Result Value Ref Range Status   Specimen Description   Final    BLOOD LEFT ARM Performed at  [facility], [address] [place], [place] [address]    Special Requests   Final    BOTTLES DRAWN AEROBIC AND ANAEROBIC Blood Culture adequate volume Performed at [facility], [address] [place], [place] [address]    Culture    Final    NO GROWTH 5 DAYS Performed at [facility], [place] [address], [place], [place] [address]    Report Status 04/07/2020 FINAL  Final  Urine culture     Status: Abnormal   Collection Time: 04/02/20  1:08 PM   Specimen: Urine, Random  Result Value Ref Range Status   Specimen Description   Final    URINE, RANDOM Performed at [facility], [address] [place], [place] [address]    Special Requests   Final    NONE Performed at [facility], [address] [place], [place] [address]    Culture MULTIPLE SPECIES PRESENT, SUGGEST RECOLLECTION (A)  Final   Report Status 04/03/2020 FINAL  Final  Resp Panel by RT PCR (RSV, Flu A&B, Covid) - Nasopharyngeal Swab     Status: None   Collection Time: 04/02/20  1:08 PM   Specimen: Nasopharyngeal Swab  Result Value Ref Range Status   SARS Coronavirus 2 by RT PCR NEGATIVE NEGATIVE Final    Comment: (NOTE) SARS-CoV-2 target nucleic acids are NOT DETECTED.  The SARS-CoV-2 RNA is generally detectable in upper respiratoy specimens during the acute phase of infection. The lowest concentration of SARS-CoV-2 viral copies this assay can detect is 131 copies/mL. A negative result does not preclude SARS-Cov-2 infection and should not be used as the sole basis for treatment or other patient management decisions. A negative result may occur with  improper specimen collection/handling, submission of specimen other than nasopharyngeal swab, presence of viral mutation(s) within the areas targeted by this assay, and inadequate number of viral copies (<131 copies/mL). A negative result must be combined with clinical observations, patient history, and epidemiological information. The expected result is Negative.  Fact Sheet for Patients:  PinkCheek.be  Fact Sheet for Healthcare Providers:  GravelBags.it  This test is no t yet approved or cleared by the [place] FDA and  has been authorized for detection and/or diagnosis of SARS-CoV-2 by FDA under an Emergency Use Authorization (EUA). This EUA will remain  in effect (meaning this test can be used) for the duration of the COVID-19 declaration under Section 564(b)(1) of the Act, 21 U.S.C. section 360bbb-3(b)(1), unless the authorization is terminated or revoked sooner.     Influenza A by PCR NEGATIVE NEGATIVE Final   Influenza B by PCR NEGATIVE NEGATIVE Final    Comment: (NOTE) The Xpert Xpress SARS-CoV-2/FLU/RSV assay is intended as an aid in  the diagnosis of influenza from Nasopharyngeal swab specimens and  should not be used as a sole basis for treatment. Nasal washings and  aspirates are unacceptable for Xpert Xpress SARS-CoV-2/FLU/RSV  testing.  Fact Sheet for Patients: PinkCheek.be  Fact Sheet for Healthcare Providers: GravelBags.it  This test is not yet approved or cleared by the [place] FDA and  has been authorized for detection and/or diagnosis of SARS-CoV-2 by  FDA under an Emergency Use Authorization (EUA). This EUA will remain  in effect (meaning this test can be used) for the duration of the  Covid-19 declaration under Section 564(b)(1) of the Act, 21  U.S.C. section 360bbb-3(b)(1), unless the authorization is  terminated or revoked.  Respiratory Syncytial Virus by PCR NEGATIVE NEGATIVE Final    Comment: (NOTE) Fact Sheet for Patients: PinkCheek.be  Fact Sheet for Healthcare Providers: GravelBags.it  This test is not yet approved or cleared by the [place] FDA and  has been authorized for detection and/or diagnosis of SARS-CoV-2 by  FDA under an Emergency Use Authorization (EUA). This EUA will remain  in effect (meaning this test can be used) for the duration of the  COVID-19 declaration under Section 564(b)(1) of the Act, 21 U.S.C.    section 360bbb-3(b)(1), unless the authorization is terminated or  revoked. Performed at [facility], [address] [place], [place] [address]   Culture, blood (x 2)     Status: None   Collection Time: 04/02/20 10:05 PM   Specimen: BLOOD  Result Value Ref Range Status   Specimen Description   Final    BLOOD LEFT HAND Performed at [facility] [address] [place], [place] [address]    Special Requests   Final    BOTTLES DRAWN AEROBIC AND ANAEROBIC Blood Culture adequate volume Performed at [facility] [address], [place], [place] [address]    Culture   Final    NO GROWTH 5 DAYS Performed at [facility], [place] [address], [place], [place] [address]    Report Status 04/08/2020 FINAL  Final  Culture, blood (x 2)     Status: None   Collection Time: 04/02/20 10:05 PM   Specimen: BLOOD  Result Value Ref Range Status   Specimen Description   Final    BLOOD RIGHT HAND Performed at [facility] [address], [place], [place] [address]    Special Requests   Final    BOTTLES DRAWN AEROBIC AND ANAEROBIC Blood Culture adequate volume Performed at [facility] [address] [place], [place] [address]    Culture   Final    NO GROWTH 5 DAYS Performed at [facility], [place] [address], [place], [place] [address]    Report Status 04/08/2020 FINAL  Final  SARS Coronavirus 2 by RT PCR (hospital order, performed in [facility] hospital lab) Nasopharyngeal Nasopharyngeal Swab     Status: None   Collection Time: 04/06/20  2:46 PM   Specimen: Nasopharyngeal Swab  Result Value Ref Range Status   SARS Coronavirus 2 NEGATIVE NEGATIVE Final    Comment: (NOTE) SARS-CoV-2 target nucleic acids are NOT DETECTED.  The SARS-CoV-2 RNA is generally detectable in upper and lower respiratory specimens during the acute phase of infection. The lowest concentration of SARS-CoV-2 viral copies  this assay can detect is 250 copies / mL. A negative result does not preclude SARS-CoV-2 infection and should not be used as the sole basis for treatment or other patient management decisions.  A negative result may occur with improper specimen collection / handling, submission of specimen other than nasopharyngeal swab, presence of viral mutation(s) within the areas targeted by this assay, and inadequate number of viral copies (<250 copies / mL). A negative result must be combined with clinical observations, patient history, and epidemiological information.  Fact Sheet for Patients:   StrictlyIdeas.no  Fact Sheet for Healthcare Providers: BankingDealers.co.za  This test is not yet approved or  cleared by the [place] FDA and has been authorized for detection and/or diagnosis of SARS-CoV-2 by FDA under an Emergency Use Authorization (EUA).  This EUA will remain in effect (meaning this test can be used)  for the duration of the COVID-19 declaration under Section 564(b)(1) of the Act, 21 U.S.C. section 360bbb-3(b)(1), unless the authorization is terminated or revoked sooner.  Performed at [facility], [place] [address], [place], [place] [address]          Radiology Studies: MR ABDOMEN WO CONTRAST  Result Date: 04/06/2020 CLINICAL DATA:  Pancreatic cancer, bladder cancer, assess treatment response, EXAM: MRI ABDOMEN WITHOUT CONTRAST TECHNIQUE: Multiplanar multisequence MR imaging was performed without the administration of intravenous contrast. Patient declined contrast administration. COMPARISON:  CT abdomen pelvis, 12/10/2019, MR abdomen, 09/04/2019 FINDINGS: Examination is generally limited throughout by breath motion artifact and lack of intravenous contrast. Lower chest: No acute findings. Hepatobiliary: There is intra and extrahepatic biliary ductal dilatation, similar to prior examination, with abrupt  obstruction of the central common bile duct in the pancreatic head. There appears to be significant scarring and volume loss of the left lobe of the liver. A common bile duct stent appears to be present but is poorly imaged by MR. Numerous gallstones in the gallbladder with severe thickening and inflammatory change about the gallbladder wall, with what appear to be multiple ulcerations of the gallbladder wall (series 10, image 70, 62). There appears to be a gallstone near the gallbladder neck (series 4, image 24). Pancreas: Subtly T1 and T2 hypointense, small mass of the central pancreatic head is again noted, difficult to accurately measure given lack of intravenous contrast and small size but not obviously changed compared to prior examinations (series 10, image 71). Inflammatory changes, or other parenchymal abnormality identified. Spleen:  Within normal limits in size and appearance. Adrenals/Urinary Tract: Atrophic appearance of the bilateral kidneys, with moderate bilateral hydronephrosis. Stomach/Bowel: Visualized portions within the abdomen are unremarkable. Vascular/Lymphatic: No pathologically enlarged lymph nodes identified. No abdominal aortic aneurysm demonstrated. Other:  None. Musculoskeletal: No suspicious bone lesions identified. Pectus deformity of the included lower chest. IMPRESSION: 1. Examination is very limited by lack of intravenous contrast and breath motion artifact. 2. Numerous gallstones in the gallbladder with severe thickening and inflammatory change about the gallbladder wall, with what appear to be multiple ulcerations of the gallbladder wall. Findings are highly concerning for acute cholecystitis. There appears to be a gallstone near the gallbladder neck, which may be obstructing. 3. Subtly T1 and T2 hypointense, small mass of the central pancreatic head is again noted, difficult to accurately measure given lack of intravenous contrast and small size but not obviously changed  compared to prior examinations. This remains consistent with small pancreatic adenocarcinoma. 4. Intra and extrahepatic biliary ductal dilatation, with abrupt obstruction of the central common bile duct in the pancreatic head. A common bile duct stent appears to be present but is poorly imaged by MR. 5. Marked postobstructive scarring and volume loss of the left lobe of the liver, similar to prior examination. 6. Moderate bilateral hydronephrosis, slightly increased compared to prior examination. Distal ureters and bladder are not imaged. Consider additional imaging of the pelvis given reported history of bladder cancer. Electronically Signed   By: Eddie Candle M.D.   On: 04/06/2020 16:16   MR 3D Recon At Scanner  Result Date: 04/06/2020 CLINICAL DATA:  Pancreatic cancer, bladder cancer, assess treatment response, EXAM: MRI ABDOMEN WITHOUT CONTRAST TECHNIQUE: Multiplanar multisequence MR imaging was performed without the administration of intravenous contrast. Patient declined contrast administration. COMPARISON:  CT abdomen pelvis, 12/10/2019, MR abdomen, 09/04/2019 FINDINGS: Examination is generally limited throughout by breath motion artifact and lack of intravenous contrast. Lower chest: No acute  findings. Hepatobiliary: There is intra and extrahepatic biliary ductal dilatation, similar to prior examination, with abrupt obstruction of the central common bile duct in the pancreatic head. There appears to be significant scarring and volume loss of the left lobe of the liver. A common bile duct stent appears to be present but is poorly imaged by MR. Numerous gallstones in the gallbladder with severe thickening and inflammatory change about the gallbladder wall, with what appear to be multiple ulcerations of the gallbladder wall (series 10, image 70, 62). There appears to be a gallstone near the gallbladder neck (series 4, image 24). Pancreas: Subtly T1 and T2 hypointense, small mass of the central pancreatic  head is again noted, difficult to accurately measure given lack of intravenous contrast and small size but not obviously changed compared to prior examinations (series 10, image 71). Inflammatory changes, or other parenchymal abnormality identified. Spleen:  Within normal limits in size and appearance. Adrenals/Urinary Tract: Atrophic appearance of the bilateral kidneys, with moderate bilateral hydronephrosis. Stomach/Bowel: Visualized portions within the abdomen are unremarkable. Vascular/Lymphatic: No pathologically enlarged lymph nodes identified. No abdominal aortic aneurysm demonstrated. Other:  None. Musculoskeletal: No suspicious bone lesions identified. Pectus deformity of the included lower chest. IMPRESSION: 1. Examination is very limited by lack of intravenous contrast and breath motion artifact. 2. Numerous gallstones in the gallbladder with severe thickening and inflammatory change about the gallbladder wall, with what appear to be multiple ulcerations of the gallbladder wall. Findings are highly concerning for acute cholecystitis. There appears to be a gallstone near the gallbladder neck, which may be obstructing. 3. Subtly T1 and T2 hypointense, small mass of the central pancreatic head is again noted, difficult to accurately measure given lack of intravenous contrast and small size but not obviously changed compared to prior examinations. This remains consistent with small pancreatic adenocarcinoma. 4. Intra and extrahepatic biliary ductal dilatation, with abrupt obstruction of the central common bile duct in the pancreatic head. A common bile duct stent appears to be present but is poorly imaged by MR. 5. Marked postobstructive scarring and volume loss of the left lobe of the liver, similar to prior examination. 6. Moderate bilateral hydronephrosis, slightly increased compared to prior examination. Distal ureters and bladder are not imaged. Consider additional imaging of the pelvis given reported  history of bladder cancer. Electronically Signed   By: Eddie Candle M.D.   On: 04/06/2020 16:16        Scheduled Meds: . lactose free nutrition  237 mL Oral BID BM  . levETIRAcetam  500 mg Oral BID  . levothyroxine  100 mcg Oral Q0600  . melatonin  10 mg Oral QHS  . metoprolol tartrate  25 mg Oral BID  . pantoprazole  40 mg Oral Daily  . sodium bicarbonate  650 mg Oral BID   Continuous Infusions: . cefTRIAXone (ROCEPHIN)  IV 2 g (04/07/20 1725)  . metronidazole 500 mg (04/08/20 0953)     LOS: 6 days    Time spent: 30 minutes    Barb Merino, MD [facility] Pager [phone]

## 2020-04-08 NOTE — Progress Notes (Signed)
[facility] Progress Note     Subjective: CC-  Sitting up in bed. States that she feels fine at rest, but still has RUQ pain with movement or palpation. Some nausea with PO intake last night, no emesis. WBC slightly down 17.2, TMAX 99.4 Going for perc chole tube today.  Objective: Vital signs in last 24 hours: Temp:  [97.7 F (36.5 C)-99.4 F (37.4 C)] 99.4 F (37.4 C) (10/28 0604) Pulse Rate:  [63-65] 65 (10/28 0604) Resp:  [16-18] 16 (10/27 2131) BP: (95-101)/(49-53) 101/49 (10/28 0604) SpO2:  [95 %-99 %] 95 % (10/28 0604) Last BM Date: 04/02/20  Intake/Output from previous day: 10/27 0701 - 10/28 0700 In: 220 [P.O.:120; IV Piggyback:100] Out: 1400 [Urine:1400] Intake/Output this shift: No intake/output data recorded.  PE: Gen:  Alert, NAD, pleasant Pulm:  CTAB, no W/R/R, rate and effort normal Abd: soft, ND, +BS, no masses, hernias, or organomegaly. TTP RUQ and epigastric region with some guarding  Ext:  no BUE/BLE edema, calves soft and nontender Psych: A&Ox4  Skin: no rashes noted, warm and dry  Lab Results:  Recent Labs    04/07/20 0406 04/08/20 0330  WBC 19.8* 17.2*  HGB 10.4* 10.0*  HCT 32.0* 30.6*  PLT 159 173   BMET Recent Labs    04/07/20 0406 04/08/20 0330  NA 134* 135  K 4.1 4.3  CL 107 108  CO2 18* 18*  GLUCOSE 99 160*  BUN 50* 52*  CREATININE 1.61* 1.68*  CALCIUM 8.4* 8.2*   PT/INR Recent Labs    04/07/20 1154 04/08/20 0330  LABPROT 14.4 16.5*  INR 1.2 1.4*   CMP     Component Value Date/Time   NA 135 04/08/2020 0330   K 4.3 04/08/2020 0330   CL 108 04/08/2020 0330   CO2 18 (L) 04/08/2020 0330   GLUCOSE 160 (H) 04/08/2020 0330   BUN 52 (H) 04/08/2020 0330   CREATININE 1.68 (H) 04/08/2020 0330   CREATININE 1.88 (H) 04/02/2020 1042   CALCIUM 8.2 (L) 04/08/2020 0330   PROT 5.7 (L) 04/08/2020 0330   ALBUMIN 2.0 (L) 04/08/2020 0330   AST 17 04/08/2020 0330   AST 47 (H) 04/02/2020 1042   ALT 24 04/08/2020 0330    ALT 121 (H) 04/02/2020 1042   ALKPHOS 168 (H) 04/08/2020 0330   BILITOT 0.8 04/08/2020 0330   BILITOT 3.4 (H) 04/02/2020 1042   GFRNONAA 29 (L) 04/08/2020 0330   GFRNONAA 25 (L) 04/02/2020 1042   GFRAA 33 (L) 03/12/2020 1109   Lipase     Component Value Date/Time   LIPASE 44 04/02/2020 1145       Studies/Results: MR ABDOMEN WO CONTRAST  Result Date: 04/06/2020 CLINICAL DATA:  Pancreatic cancer, bladder cancer, assess treatment response, EXAM: MRI ABDOMEN WITHOUT CONTRAST TECHNIQUE: Multiplanar multisequence MR imaging was performed without the administration of intravenous contrast. Patient declined contrast administration. COMPARISON:  CT abdomen pelvis, 12/10/2019, MR abdomen, 09/04/2019 FINDINGS: Examination is generally limited throughout by breath motion artifact and lack of intravenous contrast. Lower chest: No acute findings. Hepatobiliary: There is intra and extrahepatic biliary ductal dilatation, similar to prior examination, with abrupt obstruction of the central common bile duct in the pancreatic head. There appears to be significant scarring and volume loss of the left lobe of the liver. A common bile duct stent appears to be present but is poorly imaged by MR. Numerous gallstones in the gallbladder with severe thickening and inflammatory change about the gallbladder wall, with what appear to be multiple  ulcerations of the gallbladder wall (series 10, image 70, 62). There appears to be a gallstone near the gallbladder neck (series 4, image 24). Pancreas: Subtly T1 and T2 hypointense, small mass of the central pancreatic head is again noted, difficult to accurately measure given lack of intravenous contrast and small size but not obviously changed compared to prior examinations (series 10, image 71). Inflammatory changes, or other parenchymal abnormality identified. Spleen:  Within normal limits in size and appearance. Adrenals/Urinary Tract: Atrophic appearance of the bilateral  kidneys, with moderate bilateral hydronephrosis. Stomach/Bowel: Visualized portions within the abdomen are unremarkable. Vascular/Lymphatic: No pathologically enlarged lymph nodes identified. No abdominal aortic aneurysm demonstrated. Other:  None. Musculoskeletal: No suspicious bone lesions identified. Pectus deformity of the included lower chest. IMPRESSION: 1. Examination is very limited by lack of intravenous contrast and breath motion artifact. 2. Numerous gallstones in the gallbladder with severe thickening and inflammatory change about the gallbladder wall, with what appear to be multiple ulcerations of the gallbladder wall. Findings are highly concerning for acute cholecystitis. There appears to be a gallstone near the gallbladder neck, which may be obstructing. 3. Subtly T1 and T2 hypointense, small mass of the central pancreatic head is again noted, difficult to accurately measure given lack of intravenous contrast and small size but not obviously changed compared to prior examinations. This remains consistent with small pancreatic adenocarcinoma. 4. Intra and extrahepatic biliary ductal dilatation, with abrupt obstruction of the central common bile duct in the pancreatic head. A common bile duct stent appears to be present but is poorly imaged by MR. 5. Marked postobstructive scarring and volume loss of the left lobe of the liver, similar to prior examination. 6. Moderate bilateral hydronephrosis, slightly increased compared to prior examination. Distal ureters and bladder are not imaged. Consider additional imaging of the pelvis given reported history of bladder cancer. Electronically Signed   By: Eddie Candle M.D.   On: 04/06/2020 16:16   MR 3D Recon At Scanner  Result Date: 04/06/2020 CLINICAL DATA:  Pancreatic cancer, bladder cancer, assess treatment response, EXAM: MRI ABDOMEN WITHOUT CONTRAST TECHNIQUE: Multiplanar multisequence MR imaging was performed without the administration of intravenous  contrast. Patient declined contrast administration. COMPARISON:  CT abdomen pelvis, 12/10/2019, MR abdomen, 09/04/2019 FINDINGS: Examination is generally limited throughout by breath motion artifact and lack of intravenous contrast. Lower chest: No acute findings. Hepatobiliary: There is intra and extrahepatic biliary ductal dilatation, similar to prior examination, with abrupt obstruction of the central common bile duct in the pancreatic head. There appears to be significant scarring and volume loss of the left lobe of the liver. A common bile duct stent appears to be present but is poorly imaged by MR. Numerous gallstones in the gallbladder with severe thickening and inflammatory change about the gallbladder wall, with what appear to be multiple ulcerations of the gallbladder wall (series 10, image 70, 62). There appears to be a gallstone near the gallbladder neck (series 4, image 24). Pancreas: Subtly T1 and T2 hypointense, small mass of the central pancreatic head is again noted, difficult to accurately measure given lack of intravenous contrast and small size but not obviously changed compared to prior examinations (series 10, image 71). Inflammatory changes, or other parenchymal abnormality identified. Spleen:  Within normal limits in size and appearance. Adrenals/Urinary Tract: Atrophic appearance of the bilateral kidneys, with moderate bilateral hydronephrosis. Stomach/Bowel: Visualized portions within the abdomen are unremarkable. Vascular/Lymphatic: No pathologically enlarged lymph nodes identified. No abdominal aortic aneurysm demonstrated. Other:  None. Musculoskeletal: No suspicious  bone lesions identified. Pectus deformity of the included lower chest. IMPRESSION: 1. Examination is very limited by lack of intravenous contrast and breath motion artifact. 2. Numerous gallstones in the gallbladder with severe thickening and inflammatory change about the gallbladder wall, with what appear to be multiple  ulcerations of the gallbladder wall. Findings are highly concerning for acute cholecystitis. There appears to be a gallstone near the gallbladder neck, which may be obstructing. 3. Subtly T1 and T2 hypointense, small mass of the central pancreatic head is again noted, difficult to accurately measure given lack of intravenous contrast and small size but not obviously changed compared to prior examinations. This remains consistent with small pancreatic adenocarcinoma. 4. Intra and extrahepatic biliary ductal dilatation, with abrupt obstruction of the central common bile duct in the pancreatic head. A common bile duct stent appears to be present but is poorly imaged by MR. 5. Marked postobstructive scarring and volume loss of the left lobe of the liver, similar to prior examination. 6. Moderate bilateral hydronephrosis, slightly increased compared to prior examination. Distal ureters and bladder are not imaged. Consider additional imaging of the pelvis given reported history of bladder cancer. Electronically Signed   By: Eddie Candle M.D.   On: 04/06/2020 16:16    Anti-infectives: Anti-infectives (From admission, onward)   Start     Dose/Rate Route Frequency Ordered Stop   04/06/20 2200  sulfamethoxazole-trimethoprim (BACTRIM DS) 800-160 MG per tablet 1 tablet  Status:  Discontinued        1 tablet Oral Every 12 hours 04/06/20 1728 04/06/20 1729   04/06/20 2200  cephALEXin (KEFLEX) capsule 250 mg  Status:  Discontinued        250 mg Oral Every 12 hours 04/06/20 1729 04/06/20 1741   04/06/20 1830  cefTRIAXone (ROCEPHIN) 2 g in sodium chloride 0.9 % 100 mL IVPB        2 g 200 mL/hr over 30 Minutes Intravenous Every 24 hours 04/06/20 1741     04/06/20 1830  metroNIDAZOLE (FLAGYL) IVPB 500 mg        500 mg 100 mL/hr over 60 Minutes Intravenous Every 8 hours 04/06/20 1741     04/06/20 0000  sulfamethoxazole-trimethoprim (BACTRIM DS) 800-160 MG tablet  Status:  Discontinued        1 tablet Oral Every 12  hours 04/06/20 1728 04/06/20    04/06/20 0000  cephALEXin (KEFLEX) 250 MG capsule        250 mg Oral Every 12 hours 04/06/20 1730 04/11/20 2359   04/02/20 2200  cefTRIAXone (ROCEPHIN) 1 g in sodium chloride 0.9 % 100 mL IVPB        1 g 200 mL/hr over 30 Minutes Intravenous Every 24 hours 04/02/20 2016 04/04/20 2154   04/02/20 1415  aztreonam (AZACTAM) 1 g in sodium chloride 0.9 % 100 mL IVPB        1 g 200 mL/hr over 30 Minutes Intravenous  Once 04/02/20 1413 04/02/20 1514       Assessment/Plan Seizures HTN Hypothyroidism CKD Chronic anemia Bladder cancer s/p cystectomy with urinary diversion BMI 15 Chronic anemia New onset atrial fibrillation not on AC   Localized pancreatic head adenocarcinoma  - s/p stereotactic radiosurgery 01/2020 - followed by Dr. 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 Continue IV antibiotics.  ID - rocephin 10/22>>10/24 and 10/26>>, flagyl 10/26>> VTE - SCDs, per primary FEN - NPO for procedure Foley - none Follow up - TBD   LOS: 6 days    [place]ellington [place], [facility] 04/08/2020, 9:00 AM Please see Amion for pager number during day hours 7:00am-4:30pm

## 2020-04-08 NOTE — Progress Notes (Signed)
PT TAKEN TO IR PROCEDURE VIA TRANSPORT. IV LINE DISCONNECTED AND CAPPED.

## 2020-04-08 NOTE — Procedures (Signed)
Interventional Radiology Procedure Note  Procedure: Placement of a percutaneous cholecystostomy tube, 36F.   Complications: None  Estimated Blood Loss: None  Recommendations: - Drain to bag - Flush Q shift - Cx sent  Signed,  Criselda Peaches, MD

## 2020-04-08 NOTE — Progress Notes (Signed)
Pt returned from IR, stable, a/o, drain patent, draining serosanguinous drainage. SRP, RN

## 2020-04-08 NOTE — Progress Notes (Signed)
°   04/08/20 1715  Biliary Tube Other (Comment) 10 Fr. RUQ  Placement Date/Time: 04/08/20 1712   Procedural Verification: Medical records & consent reviewed;Relevant studies,results and images reviewed;Site marked with initials  Time out: Correct Patient;Correct Site;Correct Procedure;Special equipment/require...  Site Description Unable to view  Dressing Status Clean;Dry;Intact  Drainage Color Serosanguineous  Tube Status Patent

## 2020-04-08 NOTE — Progress Notes (Signed)
Glenda King will have a cholecystotomy placed today.  She is not a surgical candidate for the gallstones and cholecystitis.  She still seems to be in sinus rhythm based on her cardiac monitor.  She still not eating all that much.  Her abdominal pain seems to be much better.  She has had little bit of nausea but no vomiting.  There has been no bleeding.  She has had no diarrhea.  Her labs today show white cell count 17.2.  Hemoglobin 10 and platelet count 173,000.  Her BUN is 52 creatinine 1.68.  Her albumin is 2.0.  She is on the Rocephin.  I think she is also on Flagyl.  Temperature of 99.4.  Pulse 65.  Blood pressure 101/49.  Oxygen saturation is 95% on room air.  Her abdominal exam shows a soft abdomen.  Bowel sounds are present.  There really is no tenderness over on the right side.  There is no hepatomegaly.  Her lungs are clear.  Cardiac exam regular rate and rhythm.  She has occasional extra beat.  Extremities show some symmetric muscle atrophy in upper and lower extremities.  Neurological exam is nonfocal.  Glenda King has a pancreatic cancer.  This was treated with radiosurgery.  She now has the cholecystitis.  And she has gallstones.  She will have a cholecystotomy placed.  I guess this will drain the gallbladder.  It obviously will not have any effect on the gallstones.  Hopefully, she will not passed a gallstone into her bile duct.  I appreciate the wonderful care that she is getting from all the staff on [address].  Lattie Haw, MD  Psalm 115:15

## 2020-04-09 ENCOUNTER — Other Ambulatory Visit: Payer: Self-pay | Admitting: Radiology

## 2020-04-09 DIAGNOSIS — K8050 Calculus of bile duct without cholangitis or cholecystitis without obstruction: Secondary | ICD-10-CM

## 2020-04-09 DIAGNOSIS — I4891 Unspecified atrial fibrillation: Secondary | ICD-10-CM | POA: Diagnosis not present

## 2020-04-09 LAB — CBC WITH DIFFERENTIAL/PLATELET
Abs Immature Granulocytes: 0.58 10*3/uL — ABNORMAL HIGH (ref 0.00–0.07)
Basophils Absolute: 0 10*3/uL (ref 0.0–0.1)
Basophils Relative: 0 %
Eosinophils Absolute: 0.1 10*3/uL (ref 0.0–0.5)
Eosinophils Relative: 1 %
HCT: 35.3 % — ABNORMAL LOW (ref 36.0–46.0)
Hemoglobin: 11.1 g/dL — ABNORMAL LOW (ref 12.0–15.0)
Immature Granulocytes: 5 %
Lymphocytes Relative: 3 %
Lymphs Abs: 0.4 10*3/uL — ABNORMAL LOW (ref 0.7–4.0)
MCH: 31.8 pg (ref 26.0–34.0)
MCHC: 31.4 g/dL (ref 30.0–36.0)
MCV: 101.1 fL — ABNORMAL HIGH (ref 80.0–100.0)
Monocytes Absolute: 0.5 10*3/uL (ref 0.1–1.0)
Monocytes Relative: 4 %
Neutro Abs: 10.6 10*3/uL — ABNORMAL HIGH (ref 1.7–7.7)
Neutrophils Relative %: 87 %
Platelets: 216 10*3/uL (ref 150–400)
RBC: 3.49 MIL/uL — ABNORMAL LOW (ref 3.87–5.11)
RDW: 14 % (ref 11.5–15.5)
WBC: 12.2 10*3/uL — ABNORMAL HIGH (ref 4.0–10.5)
nRBC: 0 % (ref 0.0–0.2)

## 2020-04-09 LAB — COMPREHENSIVE METABOLIC PANEL
ALT: 20 U/L (ref 0–44)
AST: 17 U/L (ref 15–41)
Albumin: 2.3 g/dL — ABNORMAL LOW (ref 3.5–5.0)
Alkaline Phosphatase: 158 U/L — ABNORMAL HIGH (ref 38–126)
Anion gap: 12 (ref 5–15)
BUN: 40 mg/dL — ABNORMAL HIGH (ref 8–23)
CO2: 19 mmol/L — ABNORMAL LOW (ref 22–32)
Calcium: 8.9 mg/dL (ref 8.9–10.3)
Chloride: 110 mmol/L (ref 98–111)
Creatinine, Ser: 1.68 mg/dL — ABNORMAL HIGH (ref 0.44–1.00)
GFR, Estimated: 29 mL/min — ABNORMAL LOW (ref >60)
Glucose, Bld: 98 mg/dL (ref 70–99)
Potassium: 4.2 mmol/L (ref 3.5–5.1)
Sodium: 141 mmol/L (ref 135–145)
Total Bilirubin: 1 mg/dL (ref 0.3–1.2)
Total Protein: 6.6 g/dL (ref 6.5–8.1)

## 2020-04-09 MED ORDER — POLYETHYLENE GLYCOL 3350 17 G PO PACK
17.0000 g | PACK | Freq: Every day | ORAL | Status: DC
  Administered 2020-04-09 – 2020-04-13 (×4): 17 g via ORAL
  Filled 2020-04-09 (×4): qty 1

## 2020-04-09 NOTE — Progress Notes (Signed)
PROGRESS NOTE    PATRECIA VEIGA  ACZ:[id number] DOB: [birth year] DOA: 04/02/2020 PCP: Javier Glazier, MD    Brief Narrative:  86 year old female with history of localized pancreatic cancer status post radiation treatment, seizures, hypertension and hypothyroidism.  Admitted to the hospital with 3 days of fatigue, generalized weakness, lightheadedness and dizziness.  On the day of admission she was seen at oncologist office, found to have A. fib with RVR with heart rate more than 130 and referred to the emergency room.  Patient was treated with diltiazem infusion and IV heparin and admitted to the hospital.  10/26, patient was seen by physical therapy and she was discharged to a skilled nursing facility.  She was about to leave the hospital.  A repeat surveillance MRI of the abdomen was done that showed acute cholecystitis, hence discharge was canceled and surgery was consulted.  She did have persistent leukocytosis. 10/28, percutaneous cholecystostomy tube placed and clinically stabilizing.   Assessment & Plan:   Principal Problem:   New onset a-fib [facility]) Active Problems:   Malignant neoplasm of bladder, unspecified (HCC)   Hypothyroidism   Chronic kidney disease (CKD) stage G4/A1, severely decreased glomerular filtration rate (GFR) between 15-29 mL/min/1.73 square meter and albuminuria creatinine ratio less than 30 mg/g (HCC)   History of seizure   Anemia in chronic renal disease   History of urinary diversion procedure   Bile duct stricture   Primary pancreatic cancer (HCC)   Acute lower UTI   Elevated troponin   Protein-calorie malnutrition, severe  New onset A. fib: Currently sinus rhythm.  Rate well controlled on metoprolol.  Echocardiogram was normal.  Initially started on anticoagulation, however per oncology's recommendation, high risk of bleeding so this was discontinued.  Currently remains stable in sinus rhythm.  Acute calculus cholecystitis: With persistent  leukocytosis and history of localized pancreatic cancer, patient underwent MRI of the abdomen that was positive for acute calculus cholecystitis along with intra and extrahepatic biliary dilatation.  LFTs are normal. Seen by GI, she does have biliary stent, plan for stent exchange next week. Percutaneous cholecystostomy tube placement 10/28 by IR. Bile cultures with gram-positive and gram-negative cocci, on Rocephin and Flagyl.  WBC count improving.  Received a dose of vancomycin perioperative.  We will continue on current antibiotics until final cultures.  UTI: Suspected present on admission.  Blood cultures and urine cultures no growth.  Treated with Rocephin, now continued to cover for intra-abdominal infection.  CKD stage IV: Her creatinine at about baseline.  We will continue to monitor.  Hypokalemia/hypomagnesemia: Replaced and adequate.  Pancreatic cancer: Status post radiation.  Followed by Dr. Marin Olp.  Also has anemia of chronic disease. Received 2 units of PRBC while in the hospital. Received 1 unit of IV iron.  Seizure disorder: Currently well controlled on Keppra.  Severe protein calorie malnutrition: Nutrition Status: Nutrition Problem: Severe Malnutrition Etiology: chronic illness, cancer and cancer related treatments Signs/Symptoms: moderate fat depletion, moderate muscle depletion, severe fat depletion, severe muscle depletion Interventions: Boost Plus  Continue to mobilize.  Status post biliary drain.  Continue to work with PT OT.  She would like to go home if improved mobility.  GI planning to do inpatient procedure before discharge.   DVT prophylaxis: SCDs   Code Status: DNR Family Communication: None. Disposition Plan: Status is: Inpatient  Remains inpatient appropriate because:IV treatments appropriate due to intensity of illness or inability to take PO and Inpatient level of care appropriate due to severity of illness  Dispo: The patient is from: Home,  independent living              Anticipated d/c is to: Home with home health.              Anticipated d/c date is: 3 days.              Patient currently is not medically stable to d/c.  Plan for inpatient procedure before discharge.  Also needing IV antibiotics for acute cholecystitis.         Consultants:   Gastroenterology  General surgery  Oncology  Interventional radiology  Procedures:   None  Antimicrobials:  Anti-infectives (From admission, onward)   Start     Dose/Rate Route Frequency Ordered Stop   04/08/20 1645  vancomycin (VANCOCIN) IVPB 1000 mg/200 mL premix        1,000 mg 200 mL/hr over 60 Minutes Intravenous  Once 04/08/20 1627 04/08/20 1858   04/06/20 2200  sulfamethoxazole-trimethoprim (BACTRIM DS) 800-160 MG per tablet 1 tablet  Status:  Discontinued        1 tablet Oral Every 12 hours 04/06/20 1728 04/06/20 1729   04/06/20 2200  cephALEXin (KEFLEX) capsule 250 mg  Status:  Discontinued        250 mg Oral Every 12 hours 04/06/20 1729 04/06/20 1741   04/06/20 1830  cefTRIAXone (ROCEPHIN) 2 g in sodium chloride 0.9 % 100 mL IVPB        2 g 200 mL/hr over 30 Minutes Intravenous Every 24 hours 04/06/20 1741     04/06/20 1830  metroNIDAZOLE (FLAGYL) IVPB 500 mg        500 mg 100 mL/hr over 60 Minutes Intravenous Every 8 hours 04/06/20 1741     04/06/20 0000  sulfamethoxazole-trimethoprim (BACTRIM DS) 800-160 MG tablet  Status:  Discontinued        1 tablet Oral Every 12 hours 04/06/20 1728 04/06/20    04/06/20 0000  cephALEXin (KEFLEX) 250 MG capsule        250 mg Oral Every 12 hours 04/06/20 1730 04/11/20 2359   04/02/20 2200  cefTRIAXone (ROCEPHIN) 1 g in sodium chloride 0.9 % 100 mL IVPB        1 g 200 mL/hr over 30 Minutes Intravenous Every 24 hours 04/02/20 2016 04/04/20 2154   04/02/20 1415  aztreonam (AZACTAM) 1 g in sodium chloride 0.9 % 100 mL IVPB        1 g 200 mL/hr over 30 Minutes Intravenous  Once 04/02/20 1413 04/02/20 1514          Subjective: Seen and examined.  Minimal abdominal pain.  Feels better than yesterday after the drain placed.  Objective: Vitals:   04/08/20 1715 04/08/20 1828 04/08/20 2020 04/09/20 0550  BP: 121/60 (!) 117/53 (!) 115/59 (!) 118/59  Pulse: 63 (!) 59 63 69  Resp: (!) 21  16 18   Temp:   98.1 F (36.7 C) 97.7 F (36.5 C)  TempSrc:   Oral Oral  SpO2: 100%  99% 96%  Weight:      Height:        Intake/Output Summary (Last 24 hours) at 04/09/2020 1133 Last data filed at 04/09/2020 0600 Gross per 24 hour  Intake 601.17 ml  Output 1230 ml  Net -628.83 ml   Filed Weights   04/02/20 2012  Weight: 44.4 kg    Examination:  General exam: Appears calm and comfortable  Chronically sick looking, she is cachectic but not in any  distress. Respiratory system: Clear to auscultation. Respiratory effort normal. Cardiovascular system: S1 & S2 heard, RRR. No JVD, murmurs, rubs, gallops or clicks. No pedal edema. Gastrointestinal system: Mild pain along the cholecystostomy tube.  Serosanguineous blood-tinged bile draining. Central nervous system: Alert and oriented. No focal neurological deficits. Extremities: Symmetric 5 x 5 power. Skin: No rashes, lesions or ulcers Psychiatry: Judgement and insight appear normal. Mood & affect appropriate.     Data Reviewed: I have personally reviewed following labs and imaging studies  CBC: Recent Labs  Lab 04/03/20 0024 04/03/20 0024 04/04/20 0515 04/06/20 0427 04/07/20 0406 04/08/20 0330 04/09/20 0739  WBC 17.5*   < > 16.4* 19.5* 19.8* 17.2* 12.2*  NEUTROABS 15.9*  --  15.0* 17.8* 18.2*  --  10.6*  HGB 9.0*   < > 8.1* 11.2* 10.4* 10.0* 11.1*  HCT 27.0*   < > 25.6* 33.4* 32.0* 30.6* 35.3*  MCV 97.1   < > 101.2* 96.0 98.8 99.4 101.1*  PLT 132*   < > 136* 131* 159 173 216   < > = values in this interval not displayed.   Basic Metabolic Panel: Recent Labs  Lab 04/02/20 1245 04/02/20 1308 04/03/20 0024 04/03/20 0024 04/04/20 0515  04/06/20 0427 04/07/20 0406 04/08/20 0330 04/09/20 0739  NA   < >  --  137   < > 135 136 134* 135 141  K   < >  --  3.8   < > 3.9 3.3* 4.1 4.3 4.2  CL   < >  --  105   < > 106 105 107 108 110  CO2  --   --  20*   < > 20* 20* 18* 18* 19*  GLUCOSE   < >  --  116*   < > 105* 110* 99 160* 98  BUN   < >  --  41*   < > 42* 53* 50* 52* 40*  CREATININE   < >  --  1.62*   < > 1.75* 1.73* 1.61* 1.68* 1.68*  CALCIUM  --   --  8.2*   < > 8.2* 8.2* 8.4* 8.2* 8.9  MG  --  1.7 1.8  --   --   --   --  2.0  --   PHOS  --   --  3.3  --   --   --   --  3.7  --    < > = values in this interval not displayed.   GFR: Estimated Creatinine Clearance: 16.2 mL/min (A) (by C-G formula based on SCr of 1.68 mg/dL (H)). Liver Function Tests: Recent Labs  Lab 04/03/20 0024 04/07/20 0406 04/08/20 0330 04/09/20 0739  AST 32 18 17 17   ALT 86* 26 24 20   ALKPHOS 206* 167* 168* 158*  BILITOT 2.7* 1.1 0.8 1.0  PROT 6.3* 6.0* 5.7* 6.6  ALBUMIN 2.7* 2.2* 2.0* 2.3*   Recent Labs  Lab 04/02/20 1145  LIPASE 44  AMYLASE 57   No results for input(s): AMMONIA in the last 168 hours. Coagulation Profile: Recent Labs  Lab 04/02/20 1308 04/07/20 1154 04/08/20 0330  INR 1.2 1.2 1.4*   Cardiac Enzymes: No results for input(s): CKTOTAL, CKMB, CKMBINDEX, TROPONINI in the last 168 hours. BNP (last 3 results) No results for input(s): PROBNP in the last 8760 hours. HbA1C: No results for input(s): HGBA1C in the last 72 hours. CBG: Recent Labs  Lab 04/03/20 1204 04/03/20 1712 04/06/20 1524  GLUCAP 136* 132* 106*  Lipid Profile: No results for input(s): CHOL, HDL, LDLCALC, TRIG, CHOLHDL, LDLDIRECT in the last 72 hours. Thyroid Function Tests: No results for input(s): TSH, T4TOTAL, FREET4, T3FREE, THYROIDAB in the last 72 hours. Anemia Panel: No results for input(s): VITAMINB12, FOLATE, FERRITIN, TIBC, IRON, RETICCTPCT in the last 72 hours. Sepsis Labs: Recent Labs  Lab 04/02/20 1308 04/02/20 1515  04/02/20 2056  PROCALCITON  --   --  6.26  LATICACIDVEN 2.1* 1.4  --     Recent Results (from the past 240 hour(s))  Culture, blood (single)     Status: None   Collection Time: 04/02/20  1:05 PM   Specimen: BLOOD LEFT ARM  Result Value Ref Range Status   Specimen Description   Final    BLOOD LEFT ARM Performed at [facility], [address] [place], [place] [address]    Special Requests   Final    BOTTLES DRAWN AEROBIC AND ANAEROBIC Blood Culture adequate volume Performed at [facility], [address] [place], [place] [address]    Culture   Final    NO GROWTH 5 DAYS Performed at [facility], [place] [address], [place], [place] [address]    Report Status 04/07/2020 FINAL  Final  Urine culture     Status: Abnormal   Collection Time: 04/02/20  1:08 PM   Specimen: Urine, Random  Result Value Ref Range Status   Specimen Description   Final    URINE, RANDOM Performed at [facility], [address] [place], [place] [address]    Special Requests   Final    NONE Performed at [facility], [address] [place], [place] [address]    Culture MULTIPLE SPECIES PRESENT, SUGGEST RECOLLECTION (A)  Final   Report Status 04/03/2020 FINAL  Final  Resp Panel by RT PCR (RSV, Flu A&B, Covid) - Nasopharyngeal Swab     Status: None   Collection Time: 04/02/20  1:08 PM   Specimen: Nasopharyngeal Swab  Result Value Ref Range Status   SARS Coronavirus 2 by RT PCR NEGATIVE NEGATIVE Final    Comment: (NOTE) SARS-CoV-2 target nucleic acids are NOT DETECTED.  The SARS-CoV-2 RNA is generally detectable in upper respiratoy specimens during the acute phase of infection. The lowest concentration of SARS-CoV-2 viral copies this assay can detect is 131 copies/mL. A negative result does not preclude SARS-Cov-2 infection and should not be used as the sole basis for treatment or other patient management decisions. A negative result may occur with    improper specimen collection/handling, submission of specimen other than nasopharyngeal swab, presence of viral mutation(s) within the areas targeted by this assay, and inadequate number of viral copies (<131 copies/mL). A negative result must be combined with clinical observations, patient history, and epidemiological information. The expected result is Negative.  Fact Sheet for Patients:  PinkCheek.be  Fact Sheet for Healthcare Providers:  GravelBags.it  This test is no t yet approved or cleared by the [place] FDA and  has been authorized for detection and/or diagnosis of SARS-CoV-2 by FDA under an Emergency Use Authorization (EUA). This EUA will remain  in effect (meaning this test can be used) for the duration of the COVID-19 declaration under Section 564(b)(1) of the Act, 21 U.S.C. section 360bbb-3(b)(1), unless the authorization is terminated or revoked sooner.     Influenza A by PCR NEGATIVE NEGATIVE Final   Influenza B by PCR NEGATIVE NEGATIVE Final  Comment: (NOTE) The Xpert Xpress SARS-CoV-2/FLU/RSV assay is intended as an aid in  the diagnosis of influenza from Nasopharyngeal swab specimens and  should not be used as a sole basis for treatment. Nasal washings and  aspirates are unacceptable for Xpert Xpress SARS-CoV-2/FLU/RSV  testing.  Fact Sheet for Patients: PinkCheek.be  Fact Sheet for Healthcare Providers: GravelBags.it  This test is not yet approved or cleared by the [place] FDA and  has been authorized for detection and/or diagnosis of SARS-CoV-2 by  FDA under an Emergency Use Authorization (EUA). This EUA will remain  in effect (meaning this test can be used) for the duration of the  Covid-19 declaration under Section 564(b)(1) of the Act, 21  U.S.C. section 360bbb-3(b)(1), unless the authorization is  terminated or revoked.     Respiratory Syncytial Virus by PCR NEGATIVE NEGATIVE Final    Comment: (NOTE) Fact Sheet for Patients: PinkCheek.be  Fact Sheet for Healthcare Providers: GravelBags.it  This test is not yet approved or cleared by the [place] FDA and  has been authorized for detection and/or diagnosis of SARS-CoV-2 by  FDA under an Emergency Use Authorization (EUA). This EUA will remain  in effect (meaning this test can be used) for the duration of the  COVID-19 declaration under Section 564(b)(1) of the Act, 21 U.S.C.  section 360bbb-3(b)(1), unless the authorization is terminated or  revoked. Performed at [facility], [address] [place], [place] [address]   Culture, blood (x 2)     Status: None   Collection Time: 04/02/20 10:05 PM   Specimen: BLOOD  Result Value Ref Range Status   Specimen Description   Final    BLOOD LEFT HAND Performed at [facility] [address] [place], [place] [address]    Special Requests   Final    BOTTLES DRAWN AEROBIC AND ANAEROBIC Blood Culture adequate volume Performed at [facility] [address], [place], [place] [address]    Culture   Final    NO GROWTH 5 DAYS Performed at [facility], [place] [address], [place], [place] [address]    Report Status 04/08/2020 FINAL  Final  Culture, blood (x 2)     Status: None   Collection Time: 04/02/20 10:05 PM   Specimen: BLOOD  Result Value Ref Range Status   Specimen Description   Final    BLOOD RIGHT HAND Performed at [place] [address], [place], [place] [address]    Special Requests   Final    BOTTLES DRAWN AEROBIC AND ANAEROBIC Blood Culture adequate volume Performed at [facility] [address], [place], [place] [address]    Culture   Final    NO GROWTH 5 DAYS Performed at [facility], [place] [address], [place], [place]  [address]    Report Status 04/08/2020 FINAL  Final  SARS Coronavirus 2 by RT PCR (hospital order, performed in [facility] hospital lab) Nasopharyngeal Nasopharyngeal Swab     Status: None   Collection Time: 04/06/20  2:46 PM   Specimen: Nasopharyngeal Swab  Result Value Ref Range Status   SARS Coronavirus 2 NEGATIVE NEGATIVE Final    Comment: (NOTE) SARS-CoV-2 target nucleic acids are NOT DETECTED.  The SARS-CoV-2 RNA is generally detectable in upper and lower respiratory specimens during the acute phase of infection. The lowest concentration of SARS-CoV-2 viral copies this assay can detect is 250 copies / mL. A negative result  does not preclude SARS-CoV-2 infection and should not be used as the sole basis for treatment or other patient management decisions.  A negative result may occur with improper specimen collection / handling, submission of specimen other than nasopharyngeal swab, presence of viral mutation(s) within the areas targeted by this assay, and inadequate number of viral copies (<250 copies / mL). A negative result must be combined with clinical observations, patient history, and epidemiological information.  Fact Sheet for Patients:   StrictlyIdeas.no  Fact Sheet for Healthcare Providers: BankingDealers.co.za  This test is not yet approved or  cleared by the [place] FDA and has been authorized for detection and/or diagnosis of SARS-CoV-2 by FDA under an Emergency Use Authorization (EUA).  This EUA will remain in effect (meaning this test can be used) for the duration of the COVID-19 declaration under Section 564(b)(1) of the Act, 21 U.S.C. section 360bbb-3(b)(1), unless the authorization is terminated or revoked sooner.  Performed at [facility], [place] [address], [place], [place] [address]   Aerobic/Anaerobic Culture (surgical/deep wound)     Status: None (Preliminary result)   Collection  Time: 04/08/20  5:00 PM   Specimen: BILE; Abscess  Result Value Ref Range Status   Specimen Description   Final    BILE ABSCESS Performed at [place] [address], [place], [place] [address]    Special Requests   Final    NONE Performed at [facility], [place] [address], [place],  [address]    Gram Stain   Final    ABUNDANT WBC PRESENT, PREDOMINANTLY PMN ABUNDANT GRAM POSITIVE COCCI RARE GRAM NEGATIVE RODS    Culture   Final    CULTURE REINCUBATED FOR BETTER GROWTH Performed at [facility], [place] [address] [place],  [address]    Report Status PENDING  Incomplete         Radiology Studies: IR Perc Cholecystostomy  Result Date: 04/09/2020 INDICATION: 86 year old female with acute calculus cholecystitis. She is not an operative candidate and therefore presents for placement of a percutaneous cholecystostomy tube. EXAM: CHOLECYSTOSTOMY MEDICATIONS: 1 g vancomycin; The antibiotic was administered within an appropriate time frame prior to the initiation of the procedure. ANESTHESIA/SEDATION: Moderate (conscious) sedation was employed during this procedure. A total of Versed 0.5 mg and Fentanyl 25 mcg and 0.5 mg Dilaudid was administered intravenously. Moderate Sedation Time: 11 minutes. The patient's level of consciousness and vital signs were monitored continuously by radiology nursing throughout the procedure under my direct supervision. FLUOROSCOPY TIME:  Fluoroscopy Time: 0 minutes 54 seconds (9 mGy). COMPLICATIONS: None immediate. PROCEDURE: Informed written consent was obtained from the patient after a thorough discussion of the procedural risks, benefits and alternatives. All questions were addressed. Maximal Sterile Barrier Technique was utilized including caps, mask, sterile gowns, sterile gloves, sterile drape, hand hygiene and skin antiseptic. A timeout was performed prior to the initiation of the procedure. The right  upper quadrant was interrogated with ultrasound. The gallbladder is distended thick walled contains multiple echogenic foci consistent with stones. A suitable skin entry site was selected and marked. Local anesthesia was attained by infiltration with 1% lidocaine. A small dermatotomy was made. Under real-time ultrasound guidance, a 21 gauge Accustick needle was advanced along a short transhepatic tract and into the gallbladder lumen. A 0.018 wire was then coiled in the gallbladder lumen. The needle was exchanged for the Accustick sheath dilator which was advanced into the gallbladder lumen. The 0.018 wire was then exchanged  for a 75 cm superstiff Amplatz wire. The Accustick sheath was then removed and the skin and transhepatic tract dilated to 10 [place]. A Cook 10.2 [place] all-purpose drainage catheter was then advanced over the wire and formed in the gallbladder lumen. Aspiration yields frankly purulent bile. A sample was sent for Gram stain and culture. A gentle hand injection of contrast material was then performed opacifying the gallbladder lumen. Multiple faceted filling defects are present consistent with cholelithiasis. The tube was gently flushed with saline, connected to gravity bag drainage and secured to the skin with 0 Prolene suture. The patient tolerated the procedure well. IMPRESSION: Successful placement of a transhepatic percutaneous cholecystostomy tube for a diagnosis of acute calculus cholecystitis. PLAN: 1. Maintain drain to gravity bag. 2. Flush at least once per shift. 3. Cultures are pending from purulent bile. Signed, Criselda Peaches, MD, [facility] Radiology Electronically Signed   By: Jacqulynn Cadet M.D.   On: 04/09/2020 09:24        Scheduled Meds:  lactose free nutrition  237 mL Oral BID BM   levETIRAcetam  500 mg Oral BID   levothyroxine  100 mcg Oral Q0600   melatonin  10 mg Oral QHS   metoprolol tartrate   25 mg Oral BID   pantoprazole  40 mg Oral Daily   polyethylene glycol  17 g Oral Daily   sodium bicarbonate  650 mg Oral BID   sodium chloride flush  5 mL Intracatheter Q8H   Continuous Infusions:  cefTRIAXone (ROCEPHIN)  IV 2 g (04/08/20 1828)   metronidazole 500 mg (04/09/20 0911)     LOS: 7 days    Time spent: 30 minutes    Barb Merino, MD [facility] Pager [phone]

## 2020-04-09 NOTE — TOC Progression Note (Signed)
Transition of Care [facility]) - Progression Note    Patient Details  Name: Glenda King MRN: [id number] Date of Birth: 02-22-1929  Transition of Care [facility]) CM/SW Contact  Purcell Mouton, RN Phone Number: 04/09/2020, 2:29 PM  Clinical Narrative:    Pt will discharge to SNF at [facility] and will need COVID test within 24 hrs of discharge. Her brother will transport her.   Expected Discharge Plan: Assisted Living Barriers to Discharge: No Barriers Identified  Expected Discharge Plan and Services Expected Discharge Plan: Assisted Living       Living arrangements for the past 2 months: [place] Expected Discharge Date: 04/06/20                                     Social Determinants of Health (SDOH) Interventions    Readmission Risk Interventions No flowsheet data found.

## 2020-04-09 NOTE — Progress Notes (Signed)
Occupational Therapy Progress Note  Patient with improved functional ambulation and transfers this session compared to previous session with OT. Patient supervision level to ambulate to bathroom, perform toilet tasks and standing sink side for g/h. Patient with minor posterior lean upon standing from recliner, however able to correct. Updated D/C recommendation to reflect patient improved mobility.     04/09/20 1349  OT Visit Information  Last OT Received On 04/09/20  Assistance Needed +1  History of Present Illness 86 y.o. female with medical history significant of  Pancreatic Ca seizure episode, HTN, hypothyroidism, sp cystectomy now self catheterizes 3-4 times a day. Pt admitted with fatigue, dizziness, a fall. Dx of new afib, sepsis, UTI.  Precautions  Precautions Fall  Precaution Comments pt denies falls in past 6 months  Pain Assessment  Pain Assessment No/denies pain  Cognition  Arousal/Alertness Awake/alert  Behavior During Therapy WFL for tasks assessed/performed  Overall Cognitive Status Within Functional Limits for tasks assessed  ADL  Overall ADL's  Needs assistance/impaired  Grooming Wash/dry face;Wash/dry hands;Oral care;Supervision/safety;Standing  Grooming Details (indicate cue type and reason) no loss of balance standing sink side for g/h  Armed forces technical officer Supervision/safety;Ambulation  Toilet Transfer Details (indicate cue type and reason) no AD required this session, does furniture cruise intermittently  [place];Sit to/from stand  Functional mobility during ADLs Supervision/safety  General ADL Comments much improved with functional dynamic balance this session from previous, patient reports feeling better from last OT session  Bed Mobility  General bed mobility comments OOB in recliner  Balance  Overall balance assessment Mild deficits observed, not formally tested  Transfers  Overall transfer level Needs  assistance  Equipment used None  Transfers Sit to/from Stand  Sit to Stand Supervision  General transfer comment supervision with first sit to stand from recliner due to mild posterior lean, able to correct   OT - End of Session  Activity Tolerance Patient tolerated treatment well  Patient left in chair;with call bell/phone within reach;with chair alarm set  Nurse Communication Mobility status  OT Assessment/Plan  OT Plan Discharge plan needs to be updated  OT Visit Diagnosis Unsteadiness on feet (R26.81);Muscle weakness (generalized) (M62.81);Pain  Pain - Right/Left Right  Pain - part of body  (abdomen)  OT Frequency (ACUTE ONLY) Min 2X/week  Follow Up Recommendations Home health OT  OT Equipment None recommended by OT  AM-PAC OT "6 Clicks" Daily Activity Outcome Measure (Version 2)  Help from another person eating meals? 4  Help from another person taking care of personal grooming? 3  Help from another person toileting, which includes using toliet, bedpan, or urinal? 3  Help from another person bathing (including washing, rinsing, drying)? 3  Help from another person to put on and taking off regular upper body clothing? 3  Help from another person to put on and taking off regular lower body clothing? 3  6 Click Score 19  OT Goal Progression  Progress towards OT goals Progressing toward goals  Acute Rehab OT Goals  Patient Stated Goal to return home, go to mass daily  OT Goal Formulation With patient  Time For Goal Achievement 04/17/20  Potential to Achieve Goals Good  OT Time Calculation  OT Start Time (ACUTE ONLY) 1040  OT Stop Time (ACUTE ONLY) 1056  OT Time Calculation (min) 16 min  OT General Charges  $OT Visit 1 Visit  OT Treatments  $Self Care/Home Management  8-22 mins   Delbert Phenix OT OT pager: [phone]

## 2020-04-09 NOTE — Progress Notes (Addendum)
Referring Physician(s): Tainter Lake NP  Supervising Physician: Aletta Edouard  Patient Status:  [facility] - In-pt  Chief Complaint:  Abdominal pain found to have acute cholecystitis s/p cholecystomy tube placed on 10.28.21 by Dr. Shellia Cleverly.  Subjective:  Patient alert  sitting in recliner calm and comfortable. Denies any fevers, headache, chest pain, SOB, cough. Patient reporting improved abdominal pain since placement of cholecystomy tube on 10.28.21. Patient tolerating a diet at this time. Denies and nausea or vomiting  Allergies: Gadolinium derivatives, Amoxicillin, and Pollen extract  Medications: Prior to Admission medications   Medication Sig Start Date End Date Taking? Authorizing Provider  cholecalciferol (VITAMIN D3) 25 MCG (1000 UNIT) tablet Take 1,000 Units by mouth 2 (two) times daily.   Yes [provider]  levETIRAcetam (KEPPRA) 500 MG tablet Take 500 mg by mouth 2 (two) times daily. 05/13/19  Yes [provider]  levothyroxine (SYNTHROID) 100 MCG tablet Take 100 mcg by mouth daily. 05/13/19  Yes [provider]  Melatonin 10 MG CAPS Take 10 mg by mouth at bedtime.   Yes [provider]  sodium bicarbonate 650 MG tablet Take 650 mg by mouth 2 (two) times daily. 05/27/19  Yes [provider]  vitamin B-12 1000 MCG tablet Take 1 tablet (1,000 mcg total) by mouth daily. 09/06/19  Yes Regalado, Belkys A, MD  cephALEXin (KEFLEX) 250 MG capsule Take 1 capsule (250 mg total) by mouth every 12 (twelve) hours for 5 days. 04/06/20 04/11/20  Arrien, Jimmy Picket, MD  lactose free nutrition (BOOST PLUS) LIQD Take 237 mLs by mouth 2 (two) times daily between meals. 04/06/20 05/06/20  Arrien, Jimmy Picket, MD  metoprolol tartrate (LOPRESSOR) 25 MG tablet Take 1 tablet (25 mg total) by mouth 2 (two) times daily. 04/06/20 05/06/20  Arrien, Jimmy Picket, MD  pantoprazole (PROTONIX) 40 MG tablet Take 1 tablet (40 mg total) by mouth  daily. 04/07/20 05/07/20  Arrien, Jimmy Picket, MD     Vital Signs: BP (!) 118/59 (BP Location: Left Arm)   Pulse 69   Temp 97.7 F (36.5 C) (Oral)   Resp 18   Ht 5' 8.4" (1.737 m)   Wt 97 lb 14.2 oz (44.4 kg)   SpO2 96%   BMI 14.71 kg/m   Physical Exam Vitals and nursing note reviewed.  Constitutional:      Appearance: She is well-developed.  HENT:     Head: Normocephalic and atraumatic.  Eyes:     Conjunctiva/sclera: Conjunctivae normal.  Pulmonary:     Effort: Pulmonary effort is normal.  Abdominal:     Comments: Positive RUQ drain  To gravity bag. site is unremarkable with no erythema, edema, tenderness, bleeding or drainage noted at exit site. Suture and stat lock in place. Dressing is clean dry and intact. 20 ml of  sanginous colored fluid noted in gravity bag. Drain is able to be flushed easily.  Musculoskeletal:     Cervical back: Normal range of motion.  Skin:    General: Skin is warm.  Neurological:     Mental Status: She is alert and oriented to person, place, and time.     Imaging: MR ABDOMEN WO CONTRAST  Result Date: 04/06/2020 CLINICAL DATA:  Pancreatic cancer, bladder cancer, assess treatment response, EXAM: MRI ABDOMEN WITHOUT CONTRAST TECHNIQUE: Multiplanar multisequence MR imaging was performed without the administration of intravenous contrast. Patient declined contrast administration. COMPARISON:  CT abdomen pelvis, 12/10/2019, MR abdomen, 09/04/2019 FINDINGS: Examination is generally limited throughout by breath motion  artifact and lack of intravenous contrast. Lower chest: No acute findings. Hepatobiliary: There is intra and extrahepatic biliary ductal dilatation, similar to prior examination, with abrupt obstruction of the central common bile duct in the pancreatic head. There appears to be significant scarring and volume loss of the left lobe of the liver. A common bile duct stent appears to be present but is poorly imaged by MR. Numerous gallstones  in the gallbladder with severe thickening and inflammatory change about the gallbladder wall, with what appear to be multiple ulcerations of the gallbladder wall (series 10, image 70, 62). There appears to be a gallstone near the gallbladder neck (series 4, image 24). Pancreas: Subtly T1 and T2 hypointense, small mass of the central pancreatic head is again noted, difficult to accurately measure given lack of intravenous contrast and small size but not obviously changed compared to prior examinations (series 10, image 71). Inflammatory changes, or other parenchymal abnormality identified. Spleen:  Within normal limits in size and appearance. Adrenals/Urinary Tract: Atrophic appearance of the bilateral kidneys, with moderate bilateral hydronephrosis. Stomach/Bowel: Visualized portions within the abdomen are unremarkable. Vascular/Lymphatic: No pathologically enlarged lymph nodes identified. No abdominal aortic aneurysm demonstrated. Other:  None. Musculoskeletal: No suspicious bone lesions identified. Pectus deformity of the included lower chest. IMPRESSION: 1. Examination is very limited by lack of intravenous contrast and breath motion artifact. 2. Numerous gallstones in the gallbladder with severe thickening and inflammatory change about the gallbladder wall, with what appear to be multiple ulcerations of the gallbladder wall. Findings are highly concerning for acute cholecystitis. There appears to be a gallstone near the gallbladder neck, which may be obstructing. 3. Subtly T1 and T2 hypointense, small mass of the central pancreatic head is again noted, difficult to accurately measure given lack of intravenous contrast and small size but not obviously changed compared to prior examinations. This remains consistent with small pancreatic adenocarcinoma. 4. Intra and extrahepatic biliary ductal dilatation, with abrupt obstruction of the central common bile duct in the pancreatic head. A common bile duct stent  appears to be present but is poorly imaged by MR. 5. Marked postobstructive scarring and volume loss of the left lobe of the liver, similar to prior examination. 6. Moderate bilateral hydronephrosis, slightly increased compared to prior examination. Distal ureters and bladder are not imaged. Consider additional imaging of the pelvis given reported history of bladder cancer. Electronically Signed   By: Eddie Candle M.D.   On: 04/06/2020 16:16   MR 3D Recon At Scanner  Result Date: 04/06/2020 CLINICAL DATA:  Pancreatic cancer, bladder cancer, assess treatment response, EXAM: MRI ABDOMEN WITHOUT CONTRAST TECHNIQUE: Multiplanar multisequence MR imaging was performed without the administration of intravenous contrast. Patient declined contrast administration. COMPARISON:  CT abdomen pelvis, 12/10/2019, MR abdomen, 09/04/2019 FINDINGS: Examination is generally limited throughout by breath motion artifact and lack of intravenous contrast. Lower chest: No acute findings. Hepatobiliary: There is intra and extrahepatic biliary ductal dilatation, similar to prior examination, with abrupt obstruction of the central common bile duct in the pancreatic head. There appears to be significant scarring and volume loss of the left lobe of the liver. A common bile duct stent appears to be present but is poorly imaged by MR. Numerous gallstones in the gallbladder with severe thickening and inflammatory change about the gallbladder wall, with what appear to be multiple ulcerations of the gallbladder wall (series 10, image 70, 62). There appears to be a gallstone near the gallbladder neck (series 4, image 24). Pancreas: Subtly T1 and T2  hypointense, small mass of the central pancreatic head is again noted, difficult to accurately measure given lack of intravenous contrast and small size but not obviously changed compared to prior examinations (series 10, image 71). Inflammatory changes, or other parenchymal abnormality identified.  Spleen:  Within normal limits in size and appearance. Adrenals/Urinary Tract: Atrophic appearance of the bilateral kidneys, with moderate bilateral hydronephrosis. Stomach/Bowel: Visualized portions within the abdomen are unremarkable. Vascular/Lymphatic: No pathologically enlarged lymph nodes identified. No abdominal aortic aneurysm demonstrated. Other:  None. Musculoskeletal: No suspicious bone lesions identified. Pectus deformity of the included lower chest. IMPRESSION: 1. Examination is very limited by lack of intravenous contrast and breath motion artifact. 2. Numerous gallstones in the gallbladder with severe thickening and inflammatory change about the gallbladder wall, with what appear to be multiple ulcerations of the gallbladder wall. Findings are highly concerning for acute cholecystitis. There appears to be a gallstone near the gallbladder neck, which may be obstructing. 3. Subtly T1 and T2 hypointense, small mass of the central pancreatic head is again noted, difficult to accurately measure given lack of intravenous contrast and small size but not obviously changed compared to prior examinations. This remains consistent with small pancreatic adenocarcinoma. 4. Intra and extrahepatic biliary ductal dilatation, with abrupt obstruction of the central common bile duct in the pancreatic head. A common bile duct stent appears to be present but is poorly imaged by MR. 5. Marked postobstructive scarring and volume loss of the left lobe of the liver, similar to prior examination. 6. Moderate bilateral hydronephrosis, slightly increased compared to prior examination. Distal ureters and bladder are not imaged. Consider additional imaging of the pelvis given reported history of bladder cancer. Electronically Signed   By: Eddie Candle M.D.   On: 04/06/2020 16:16   IR Perc Cholecystostomy  Result Date: 04/09/2020 INDICATION: 86 year old female with acute calculus cholecystitis. She is not an operative candidate  and therefore presents for placement of a percutaneous cholecystostomy tube. EXAM: CHOLECYSTOSTOMY MEDICATIONS: 1 g vancomycin; The antibiotic was administered within an appropriate time frame prior to the initiation of the procedure. ANESTHESIA/SEDATION: Moderate (conscious) sedation was employed during this procedure. A total of Versed 0.5 mg and Fentanyl 25 mcg and 0.5 mg Dilaudid was administered intravenously. Moderate Sedation Time: 11 minutes. The patient's level of consciousness and vital signs were monitored continuously by radiology nursing throughout the procedure under my direct supervision. FLUOROSCOPY TIME:  Fluoroscopy Time: 0 minutes 54 seconds (9 mGy). COMPLICATIONS: None immediate. PROCEDURE: Informed written consent was obtained from the patient after a thorough discussion of the procedural risks, benefits and alternatives. All questions were addressed. Maximal Sterile Barrier Technique was utilized including caps, mask, sterile gowns, sterile gloves, sterile drape, hand hygiene and skin antiseptic. A timeout was performed prior to the initiation of the procedure. The right upper quadrant was interrogated with ultrasound. The gallbladder is distended thick walled contains multiple echogenic foci consistent with stones. A suitable skin entry site was selected and marked. Local anesthesia was attained by infiltration with 1% lidocaine. A small dermatotomy was made. Under real-time ultrasound guidance, a 21 gauge Accustick needle was advanced along a short transhepatic tract and into the gallbladder lumen. A 0.018 wire was then coiled in the gallbladder lumen. The needle was exchanged for the Accustick sheath dilator which was advanced into the gallbladder lumen. The 0.018 wire was then exchanged for a 75 cm superstiff Amplatz wire. The Accustick sheath was then removed and the skin and transhepatic tract dilated to 10 [place]. A Lacinda Axon  10.2 [place] all-purpose drainage catheter was then advanced over  the wire and formed in the gallbladder lumen. Aspiration yields frankly purulent bile. A sample was sent for Gram stain and culture. A gentle hand injection of contrast material was then performed opacifying the gallbladder lumen. Multiple faceted filling defects are present consistent with cholelithiasis. The tube was gently flushed with saline, connected to gravity bag drainage and secured to the skin with 0 Prolene suture. The patient tolerated the procedure well. IMPRESSION: Successful placement of a transhepatic percutaneous cholecystostomy tube for a diagnosis of acute calculus cholecystitis. PLAN: 1. Maintain drain to gravity bag. 2. Flush at least once per shift. 3. Cultures are pending from purulent bile. Signed, Criselda Peaches, MD, [facility] Radiology Electronically Signed   By: Jacqulynn Cadet M.D.   On: 04/09/2020 09:24    Labs:  CBC: Recent Labs    04/06/20 0427 04/07/20 0406 04/08/20 0330 04/09/20 0739  WBC 19.5* 19.8* 17.2* 12.2*  HGB 11.2* 10.4* 10.0* 11.1*  HCT 33.4* 32.0* 30.6* 35.3*  PLT 131* 159 173 216    COAGS: Recent Labs    09/04/19 0753 04/02/20 1308 04/07/20 1154 04/08/20 0330  INR 1.1 1.2 1.2 1.4*  APTT  --  39*  --   --     BMP: Recent Labs    12/10/19 1216 12/10/19 1216 01/19/20 1356 01/19/20 1356 02/27/20 1301 02/27/20 1301 03/12/20 1109 04/02/20 1042 04/06/20 0427 04/07/20 0406 04/08/20 0330 04/09/20 0739  NA 140   < > 140   < > 137   < > 138   < > 136 134* 135 141  K 4.8   < > 5.1   < > 5.1   < > 4.8   < > 3.3* 4.1 4.3 4.2  CL 100   < > 105   < > 102   < > 103   < > 105 107 108 110  CO2 29   < > 28   < > 28   < > 29   < > 20* 18* 18* 19*  GLUCOSE 114*   < > 131*   < > 199*   < > 251*   < > 110* 99 160* 98  BUN 42*   < > 48*   < > 45*   < > 49*   < > 53* 50* 52* 40*  CALCIUM 10.1   < > 9.7   < > 9.6   < > 9.9   < > 8.2* 8.4* 8.2* 8.9  CREATININE 1.72*   < > 1.56*   < >  1.73*   < > 1.61*   < > 1.73* 1.61* 1.68* 1.68*  GFRNONAA 26*   < > 29*   < > 26*   < > 28*   < > 28* 31* 29* 29*  GFRAA 30*  --  34*  --  30*  --  33*  --   --   --   --   --    < > = values in this interval not displayed.    LIVER FUNCTION TESTS: Recent Labs    04/03/20 0024 04/07/20 0406 04/08/20 0330 04/09/20 0739  BILITOT 2.7* 1.1 0.8 1.0  AST 32 18 17 17   ALT 86* 26 24 20   ALKPHOS 206* 167* 168* 158*  PROT 6.3* 6.0* 5.7* 6.6  ALBUMIN 2.7* 2.2* 2.0* 2.3*    Assessment and Plan:  88 y,o female inpatient. History  of localized pancreatic cancer s/p pancreatic stent placement on 3.26.21. Ad[facility] to [facility] for fatigue, weakness found to have a fib with RVR. Hospital stay complicated by abdominal pain. MRI performed on 10.26.21 indicative for  acute cholecystitis. IR placed a choleystostomy tube on 10.28.21. Patient reporting improved abdominal pain since placement of cholecystomy tube. Patient tolerating a diet at this time. Denies and nausea or vomiting. Positive RUQ drain to gravity bag. site is unremarkable with no erythema, edema, tenderness, bleeding or drainage noted at exit site. Suture and stat lock in place. Dressing is clean dry and intact. 20 ml of  sanguinous colored fluid noted in gravity bag. Per Epic 80 ml of output overnight. Drain is able to be flushed easily. WBC is 12.2 (down trending). Cultures from gallbladder preincubated for better growth. Gram stain positive for gram positive cocci  and rare gram negative rods.   Per note from [facility] patient to follow- up with Dr. Harlow Asa in 4 weeks.   Cholecystomy tube drain will need to remain in place at least 4-6 weeks unless gallbladder surgically removed in interim; continue tid drain flushes in house and once daily with 5 ml sterile normal saline as outpatient; will schedule f/u cholangiogram in 4-6 weeks. IR scheduler will call with appointment date and time.    Please call IR with questions/concerns.     Electronically  Signed: Jacqualine Mau, NP 04/09/2020, 11:01 AM   I spent a total of 15 Minutes at the patient's bedside AND on the patient's hospital floor or unit, greater than 50% of which was counseling/coordinating care for cholecystomy tube placement.

## 2020-04-09 NOTE — Progress Notes (Signed)
Iv team paged x2, after 2 attempts to restart IV which was unsuccessful, pt tolerated fine; request for IVT to assist with IV. SRP, RN

## 2020-04-09 NOTE — Progress Notes (Signed)
[facility] Progress Note     Subjective: CC-  Up in chair. Just walked in the halls with PT. Feeling much better today after perc chole. Abdominal pain improved. Denies n/v. Tolerating diet.  WBC down 12.2, afebrile  Objective: Vital signs in last 24 hours: Temp:  [97.7 F (36.5 C)-98.4 F (36.9 C)] 97.7 F (36.5 C) (10/29 0550) Pulse Rate:  [59-69] 69 (10/29 0550) Resp:  [12-24] 18 (10/29 0550) BP: (107-133)/(53-97) 118/59 (10/29 0550) SpO2:  [96 %-100 %] 96 % (10/29 0550) Last BM Date: 04/08/20  Intake/Output from previous day: 10/28 0701 - 10/29 0700 In: 701.2 [IV Piggyback:701.2] Out: 1230 [Urine:1150; Drains:80] Intake/Output this shift: No intake/output data recorded.  PE: Gen:  Alert, NAD, pleasant Pulm:  CTAB, no W/R/R, rate and effort normal Abd: soft, ND, +BS, no masses, hernias, or organomegaly. Minimal TTP around drain, perc chole tube in place with small amount of cloudy/ blood tinged fluid in bag Ext:  no BUE/BLE edema, calves soft and nontender Psych: A&Ox4  Skin: no rashes noted, warm and dry   Lab Results:  Recent Labs    04/08/20 0330 04/09/20 0739  WBC 17.2* 12.2*  HGB 10.0* 11.1*  HCT 30.6* 35.3*  PLT 173 216   BMET Recent Labs    04/08/20 0330 04/09/20 0739  NA 135 141  K 4.3 4.2  CL 108 110  CO2 18* 19*  GLUCOSE 160* 98  BUN 52* 40*  CREATININE 1.68* 1.68*  CALCIUM 8.2* 8.9   PT/INR Recent Labs    04/07/20 1154 04/08/20 0330  LABPROT 14.4 16.5*  INR 1.2 1.4*   CMP     Component Value Date/Time   NA 141 04/09/2020 0739   K 4.2 04/09/2020 0739   CL 110 04/09/2020 0739   CO2 19 (L) 04/09/2020 0739   GLUCOSE 98 04/09/2020 0739   BUN 40 (H) 04/09/2020 0739   CREATININE 1.68 (H) 04/09/2020 0739   CREATININE 1.88 (H) 04/02/2020 1042   CALCIUM 8.9 04/09/2020 0739   PROT 6.6 04/09/2020 0739   ALBUMIN 2.3 (L) 04/09/2020 0739   AST 17 04/09/2020 0739   AST 47 (H) 04/02/2020 1042   ALT 20 04/09/2020 0739    ALT 121 (H) 04/02/2020 1042   ALKPHOS 158 (H) 04/09/2020 0739   BILITOT 1.0 04/09/2020 0739   BILITOT 3.4 (H) 04/02/2020 1042   GFRNONAA 29 (L) 04/09/2020 0739   GFRNONAA 25 (L) 04/02/2020 1042   GFRAA 33 (L) 03/12/2020 1109   Lipase     Component Value Date/Time   LIPASE 44 04/02/2020 1145       Studies/Results: IR Perc Cholecystostomy  Result Date: 04/09/2020 INDICATION: 86 year old female with acute calculus cholecystitis. She is not an operative candidate and therefore presents for placement of a percutaneous cholecystostomy tube. EXAM: CHOLECYSTOSTOMY MEDICATIONS: 1 g vancomycin; The antibiotic was administered within an appropriate time frame prior to the initiation of the procedure. ANESTHESIA/SEDATION: Moderate (conscious) sedation was employed during this procedure. A total of Versed 0.5 mg and Fentanyl 25 mcg and 0.5 mg Dilaudid was administered intravenously. Moderate Sedation Time: 11 minutes. The patient's level of consciousness and vital signs were monitored continuously by radiology nursing throughout the procedure under my direct supervision. FLUOROSCOPY TIME:  Fluoroscopy Time: 0 minutes 54 seconds (9 mGy). COMPLICATIONS: None immediate. PROCEDURE: Informed written consent was obtained from the patient after a thorough discussion of the procedural risks, benefits and alternatives. All questions were addressed. Maximal Sterile Barrier Technique was utilized including caps, mask,  sterile gowns, sterile gloves, sterile drape, hand hygiene and skin antiseptic. A timeout was performed prior to the initiation of the procedure. The right upper quadrant was interrogated with ultrasound. The gallbladder is distended thick walled contains multiple echogenic foci consistent with stones. A suitable skin entry site was selected and marked. Local anesthesia was attained by infiltration with 1% lidocaine. A small dermatotomy was made. Under real-time ultrasound guidance, a 21 gauge Accustick  needle was advanced along a short transhepatic tract and into the gallbladder lumen. A 0.018 wire was then coiled in the gallbladder lumen. The needle was exchanged for the Accustick sheath dilator which was advanced into the gallbladder lumen. The 0.018 wire was then exchanged for a 75 cm superstiff Amplatz wire. The Accustick sheath was then removed and the skin and transhepatic tract dilated to 10 [place]. A Cook 10.2 [place] all-purpose drainage catheter was then advanced over the wire and formed in the gallbladder lumen. Aspiration yields frankly purulent bile. A sample was sent for Gram stain and culture. A gentle hand injection of contrast material was then performed opacifying the gallbladder lumen. Multiple faceted filling defects are present consistent with cholelithiasis. The tube was gently flushed with saline, connected to gravity bag drainage and secured to the skin with 0 Prolene suture. The patient tolerated the procedure well. IMPRESSION: Successful placement of a transhepatic percutaneous cholecystostomy tube for a diagnosis of acute calculus cholecystitis. PLAN: 1. Maintain drain to gravity bag. 2. Flush at least once per shift. 3. Cultures are pending from purulent bile. Signed, Criselda Peaches, MD, [facility] Electronically Signed   By: Jacqulynn Cadet M.D.   On: 04/09/2020 09:24    Anti-infectives: Anti-infectives (From admission, onward)   Start     Dose/Rate Route Frequency Ordered Stop   04/08/20 1645  vancomycin (VANCOCIN) IVPB 1000 mg/200 mL premix        1,000 mg 200 mL/hr over 60 Minutes Intravenous  Once 04/08/20 1627 04/08/20 1858   04/06/20 2200  sulfamethoxazole-trimethoprim (BACTRIM DS) 800-160 MG per tablet 1 tablet  Status:  Discontinued        1 tablet Oral Every 12 hours 04/06/20 1728 04/06/20 1729   04/06/20 2200  cephALEXin (KEFLEX) capsule 250 mg  Status:  Discontinued        250 mg Oral Every 12  hours 04/06/20 1729 04/06/20 1741   04/06/20 1830  cefTRIAXone (ROCEPHIN) 2 g in sodium chloride 0.9 % 100 mL IVPB        2 g 200 mL/hr over 30 Minutes Intravenous Every 24 hours 04/06/20 1741     04/06/20 1830  metroNIDAZOLE (FLAGYL) IVPB 500 mg        500 mg 100 mL/hr over 60 Minutes Intravenous Every 8 hours 04/06/20 1741     04/06/20 0000  sulfamethoxazole-trimethoprim (BACTRIM DS) 800-160 MG tablet  Status:  Discontinued        1 tablet Oral Every 12 hours 04/06/20 1728 04/06/20    04/06/20 0000  cephALEXin (KEFLEX) 250 MG capsule        250 mg Oral Every 12 hours 04/06/20 1730 04/11/20 2359   04/02/20 2200  cefTRIAXone (ROCEPHIN) 1 g in sodium chloride 0.9 % 100 mL IVPB        1 g 200 mL/hr over 30 Minutes Intravenous Every 24 hours 04/02/20 2016 04/04/20 2154   04/02/20 1415  aztreonam (AZACTAM) 1 g in sodium chloride 0.9 % 100 mL IVPB  1 g 200 mL/hr over 30 Minutes Intravenous  Once 04/02/20 1413 04/02/20 1514       Assessment/Plan Seizures HTN Hypothyroidism CKD Chronic anemia Bladder cancer s/p cystectomy with urinary diversion BMI 15 Chronic anemia New onset atrial fibrillation not on AC   Localized pancreatic head adenocarcinoma - s/p stereotactic radiosurgery 01/2020 - followed by Dr. Marin Olp - CBD stricture s/p ERCP with stent placement 08/2019 - GI discussing timing of stent exchange  Acute calculus cholecystitis S/p IR perc chole 10/28 - gram stain with gram positive cocci and rare gram negative rods, cx pending - WBC down trending, patient afebrile, and pain improved. Continue drain and IV antibiotics, follow culture. If patient's pain continues to improve and she is able to tolerate a diet, she could be discharged with the drain and oral antibiotics. Follow up with Dr. Harlow Asa in about 4 weeks, on AVS.  ID -rocephin 10/22>>10/24 and 10/26>>, flagyl 10/26>> VTE -SCDs, per primary FEN -reg diet Foley -none Follow up -TBD   LOS: 7 days     Wellington Hampshire, [facility] 04/09/2020, 10:38 AM Please see Amion for pager number during day hours 7:00am-4:30pm

## 2020-04-09 NOTE — Plan of Care (Signed)

## 2020-04-09 NOTE — Plan of Care (Signed)
  Problem: Education: Goal: Knowledge of General Education information will improve Description Including pain rating scale, medication(s)/side effects and non-pharmacologic comfort measures Outcome: Progressing   

## 2020-04-09 NOTE — Plan of Care (Signed)
  Problem: Education: Goal: Knowledge of General Education information will improve Description: Including pain rating scale, medication(s)/side effects and non-pharmacologic comfort measures 04/09/2020 1355 by Zadie Rhine, RN Outcome: Progressing 04/09/2020 1059 by Zadie Rhine, RN Outcome: Progressing

## 2020-04-09 NOTE — Progress Notes (Signed)
Physical Therapy Treatment Patient Details Name: Glenda King MRN: [id number] DOB: Oct 03, 1928 Today's Date: 04/09/2020    History of Present Illness 86 y.o. female with medical history significant of  Pancreatic Ca seizure episode, HTN, hypothyroidism, sp cystectomy now self catheterizes 3-4 times a day. Pt admitted with fatigue, dizziness, a fall. Dx of new afib, sepsis, UTI.    PT Comments    Pt OOB in recliner feeling better.  Amb a great distance in hallway.    Follow Up Recommendations  Home health PT     Equipment Recommendations       Recommendations for Other Services       Precautions / Restrictions Precautions Precautions: Fall Precaution Comments: pt denies falls in past 6 months    Mobility  Bed Mobility               General bed mobility comments: OOB in recliner  Transfers Overall transfer level: Modified independent                  Ambulation/Gait Ambulation/Gait assistance: Modified independent (Device/Increase time) Gait Distance (Feet): 500 Feet Assistive device: Rolling walker (2 wheeled)       General Gait Details: used walker only for safety pt will notneed any AD use once D/C   Stairs             Wheelchair Mobility    Modified Rankin (Stroke Patients Only)       Balance                                            Cognition Arousal/Alertness: Awake/alert Behavior During Therapy: WFL for tasks assessed/performed Overall Cognitive Status: Within Functional Limits for tasks assessed                                        Exercises      General Comments        Pertinent Vitals/Pain Pain Assessment: No/denies pain    Home Living                      Prior Function            PT Goals (current goals can now be found in the care plan section) Progress towards PT goals: Progressing toward goals    Frequency    Min 3X/week      PT Plan  Current plan remains appropriate    Co-evaluation              AM-PAC PT "6 Clicks" Mobility   Outcome Measure  Help needed turning from your back to your side while in a flat bed without using bedrails?: None Help needed moving from lying on your back to sitting on the side of a flat bed without using bedrails?: None Help needed moving to and from a bed to a chair (including a wheelchair)?: None Help needed standing up from a chair using your arms (e.g., wheelchair or bedside chair)?: None Help needed to walk in hospital room?: None   6 Click Score: 20    End of Session Equipment Utilized During Treatment: Gait belt Activity Tolerance: Patient tolerated treatment well Patient left: in chair;with call bell/phone within reach;with chair alarm set Nurse Communication: Mobility status PT Visit  Diagnosis: Difficulty in walking, not elsewhere classified (R26.2)     Time: 1572-6203 PT Time Calculation (min) (ACUTE ONLY): 29 min  Charges:  $Gait Training: 8-22 mins $Therapeutic Activity: 8-22 mins                     Rica Koyanagi  PTA Acute  Rehabilitation Services Pager      [phone] Office      [phone]

## 2020-04-09 NOTE — Progress Notes (Addendum)
Patient ID: Glenda King, female   DOB: [birth year], 86 y.o.   MRN: [id number]    Progress Note   Subjective  Day # 7  CC; pancreatic adeno  CA/ acute cholecystitis  S/P cholecystostomy tube yesterday Bile  culture -abundant  gram  Cocci/gram neg rods  IV Rocephin/ Flagyl  Wbc 12.2 improved hgb 11  Tbili 1.0/alk phos 158/ALT 20 / AST 17  Patient says she feels better after the procedure yesterday, wants to try to eat.  No nausea or vomiting.  Afebrile overnight Understands that she has a significant infection in the gallbladder   Objective   Vital signs in last 24 hours: Temp:  [97.7 F (36.5 C)-98.4 F (36.9 C)] 97.7 F (36.5 C) (10/29 0550) Pulse Rate:  [59-69] 69 (10/29 0550) Resp:  [12-24] 18 (10/29 0550) BP: (107-133)/(53-97) 118/59 (10/29 0550) SpO2:  [96 %-100 %] 96 % (10/29 0550) Last BM Date: 04/08/20 General:  eld  white female in NAD Heart:  Regular rate and rhythm; no murmurs Lungs: Respirations even and unlabored, lungs CTA bilaterally Abdomen:  Soft, tender right upper quadrant nondistended. Normal bowel sounds.  Cholecystostomy tube with streaks of heme slightly cloudy fluid/bile Extremities:  Without edema. Neurologic:  Alert and oriented,  grossly normal neurologically. Psych:  Cooperative. Normal mood and affect.   Lab Results: Recent Labs    04/07/20 0406 04/08/20 0330 04/09/20 0739  WBC 19.8* 17.2* 12.2*  HGB 10.4* 10.0* 11.1*  HCT 32.0* 30.6* 35.3*  PLT 159 173 216   BMET Recent Labs    04/07/20 0406 04/08/20 0330 04/09/20 0739  NA 134* 135 141  K 4.1 4.3 4.2  CL 107 108 110  CO2 18* 18* 19*  GLUCOSE 99 160* 98  BUN 50* 52* 40*  CREATININE 1.61* 1.68* 1.68*  CALCIUM 8.4* 8.2* 8.9   LFT Recent Labs    04/09/20 0739  PROT 6.6  ALBUMIN 2.3*  AST 17  ALT 20  ALKPHOS 158*  BILITOT 1.0   PT/INR Recent Labs    04/07/20 1154 04/08/20 0330  LABPROT 14.4 16.5*  INR 1.2 1.4*       Assessment / Plan:    #72  86 year old white female with localized pancreatic head adenocarcinoma diagnosed March 2021 and status post ERCP with stent placement at that time (plastic stent) for CBD  Stricture  Acute calculus cholecystitis-status post Coley cystostomy tube placement yesterday. Gallbladder abscess-fluid with abundant gram-positive cocci/gram-negative rods, ID pending  WBC coming down LFTs stable  #2 new atrial fibrillation #3 history of bladder CA status post cystectomy #4 chronic kidney disease  Plan; patient needs to stay in the hospital and continue IV antibiotics, Await cultures Tentative plan is for ERCP with stent exchange early next week prior to discharge.     Principal Problem:   New onset a-fib [name] Active Problems:   Malignant neoplasm of bladder, unspecified (HCC)   Hypothyroidism   Chronic kidney disease (CKD) stage G4/A1, severely decreased glomerular filtration rate (GFR) between 15-29 mL/min/1.73 square meter and albuminuria creatinine ratio less than 30 mg/g (HCC)   History of seizure   Anemia in chronic renal disease   History of urinary diversion procedure   Bile duct stricture   Primary pancreatic cancer ([place])   Acute lower UTI   Elevated troponin   Protein-calorie malnutrition, severe     LOS: 7 days   Amy Esterwood PA-C 04/09/2020, 8:43 AM     [facility] Attending   I have taken an interval  history, reviewed the chart and examined the patient. I agree with the Advanced Practitioner's note, impression and recommendations.   We will f/u by Monday  Gatha Mayer, MD, [facility] 04/09/2020 5:50 PM

## 2020-04-10 DIAGNOSIS — I4891 Unspecified atrial fibrillation: Secondary | ICD-10-CM | POA: Diagnosis not present

## 2020-04-10 LAB — CBC WITH DIFFERENTIAL/PLATELET
Abs Immature Granulocytes: 0.48 10*3/uL — ABNORMAL HIGH (ref 0.00–0.07)
Basophils Absolute: 0.1 10*3/uL (ref 0.0–0.1)
Basophils Relative: 1 %
Eosinophils Absolute: 0.2 10*3/uL (ref 0.0–0.5)
Eosinophils Relative: 2 %
HCT: 32.2 % — ABNORMAL LOW (ref 36.0–46.0)
Hemoglobin: 10.3 g/dL — ABNORMAL LOW (ref 12.0–15.0)
Immature Granulocytes: 5 %
Lymphocytes Relative: 5 %
Lymphs Abs: 0.5 10*3/uL — ABNORMAL LOW (ref 0.7–4.0)
MCH: 32.2 pg (ref 26.0–34.0)
MCHC: 32 g/dL (ref 30.0–36.0)
MCV: 100.6 fL — ABNORMAL HIGH (ref 80.0–100.0)
Monocytes Absolute: 0.5 10*3/uL (ref 0.1–1.0)
Monocytes Relative: 5 %
Neutro Abs: 8.5 10*3/uL — ABNORMAL HIGH (ref 1.7–7.7)
Neutrophils Relative %: 82 %
Platelets: 221 10*3/uL (ref 150–400)
RBC: 3.2 MIL/uL — ABNORMAL LOW (ref 3.87–5.11)
RDW: 13.7 % (ref 11.5–15.5)
WBC: 10.2 10*3/uL (ref 4.0–10.5)
nRBC: 0 % (ref 0.0–0.2)

## 2020-04-10 NOTE — Progress Notes (Signed)
Glenda King has a cholecystotomy tube and now.  It is draining.  There is no pain over on the right side.  Her labs look okay.  Her white cell count is 10.2.  Hemoglobin 10.3.  Platelet count 221,000.  She said that she might go home today.  I guess she will go back to assisted living.  Her appetite is still quite low.  I think this is really going to help determine her overall prognosis.  I am unsure how long the cholecystotomy tube needs to stay in.  She has had no fever.  There is no bleeding.  There is no diarrhea.  Her vital signs all are pretty stable.  Temperature 98.1.  Pulse 64.  Blood pressure 133/66.  Her abdomen is soft.  She has a cholecystotomy tube in.  There is no obvious drainage in the bag.  There is no hepatomegaly.  She has decreased bowel sounds.  There is no guarding or rebound tenderness.  Lungs are clear.  Cardiac exam regular rate and rhythm.  Hopefully, Glenda King will go home today.  She will keep her appointment with [place].  Thankfully, I will think any this is reflective of her pancreatic cancer.  Lattie Haw, MD  Psalm 41:1

## 2020-04-10 NOTE — Progress Notes (Signed)
PROGRESS NOTE    Glenda King  BZJ:[id number] DOB: Sep 02, 1928 DOA: 04/02/2020 PCP: Javier Glazier, MD    Brief Narrative:  86 year old female with history of localized pancreatic cancer status post radiosurgery, seizures, hypertension and hypothyroidism.  Admitted to the hospital with 3 days of fatigue, generalized weakness, lightheadedness and dizziness.  On the day of admission she was seen at oncologist office, found to have A. fib with RVR with heart rate more than 130 and referred to the emergency room.  Patient was treated with diltiazem infusion and IV heparin and admitted to the hospital.  10/26, patient was seen by physical therapy and she was discharged to a skilled nursing facility.  She was about to leave the hospital.  A repeat surveillance MRI of the abdomen was done that showed acute cholecystitis, hence discharge was canceled and surgery was consulted.  She did have persistent leukocytosis. 10/28, percutaneous cholecystostomy tube placed and clinically stabilizing. Clinically stabilizing.  Waiting for gastroenterology to exchange CBD stent before discharge.   Assessment & Plan:   Principal Problem:   New onset a-fib [facility]) Active Problems:   Malignant neoplasm of bladder, unspecified (HCC)   Hypothyroidism   Chronic kidney disease (CKD) stage G4/A1, severely decreased glomerular filtration rate (GFR) between 15-29 mL/min/1.73 square meter and albuminuria creatinine ratio less than 30 mg/g (HCC)   History of seizure   Anemia in chronic renal disease   History of urinary diversion procedure   Bile duct stricture   Primary pancreatic cancer (HCC)   Acute lower UTI   Elevated troponin   Protein-calorie malnutrition, severe  New onset A. fib: Currently sinus rhythm.  Rate well controlled on metoprolol.  Echocardiogram was normal.  Initially started on anticoagulation, however per oncology's recommendation, high risk of bleeding so this was discontinued.   Currently remains stable in sinus rhythm.  Acute calculus cholecystitis: With persistent leukocytosis and history of localized pancreatic cancer, patient underwent MRI of the abdomen that was positive for acute calculus cholecystitis along with intra and extrahepatic biliary dilatation.  LFTs are normal. Seen by GI, she does have biliary stent, plan for stent exchange next week. Percutaneous cholecystostomy tube placement 10/28 by IR. Bile cultures with gram-positive and gram-negative cocci, on Rocephin and Flagyl.  WBC count improving.  Received a dose of vancomycin perioperative.  We will continue on current antibiotics until final cultures.  UTI: Suspected present on admission.  Blood cultures and urine cultures no growth.  Treated with Rocephin, now continued to cover for intra-abdominal infection.  CKD stage IV: Her creatinine at about baseline.  We will continue to monitor.  Hypokalemia/hypomagnesemia: Replaced and adequate.  Pancreatic cancer: Status post radiation.  Followed by Dr. Marin Olp.  Also has anemia of chronic disease. Received 2 units of PRBC while in the hospital. Received 1 unit of IV iron.  Seizure disorder: Currently well controlled on Keppra.  Severe protein calorie malnutrition: Nutrition Status: Nutrition Problem: Severe Malnutrition Etiology: chronic illness, cancer and cancer related treatments Signs/Symptoms: moderate fat depletion, moderate muscle depletion, severe fat depletion, severe muscle depletion Interventions: Boost Plus  Continue mobilize.  Work with PT OT. Discharge pending GI procedure.   DVT prophylaxis: SCDs   Code Status: DNR Family Communication: None.  Patient communicating with family. Disposition Plan: Status is: Inpatient  Remains inpatient appropriate because:IV treatments appropriate due to intensity of illness or inability to take PO and Inpatient level of care appropriate due to severity of illness   Dispo: The patient is  from: Home,  independent living              Anticipated d/c is to: SNF.              Anticipated d/c date is: 2 to 3 days, after GI procedure.              Patient currently is not medically stable to d/c.  Plan for inpatient procedure before discharge.  Also needing IV antibiotics for acute cholecystitis.         Consultants:   Gastroenterology  General surgery  Oncology  Interventional radiology  Procedures:   None  Antimicrobials:  Anti-infectives (From admission, onward)   Start     Dose/Rate Route Frequency Ordered Stop   04/08/20 1645  vancomycin (VANCOCIN) IVPB 1000 mg/200 mL premix        1,000 mg 200 mL/hr over 60 Minutes Intravenous  Once 04/08/20 1627 04/08/20 1858   04/06/20 2200  sulfamethoxazole-trimethoprim (BACTRIM DS) 800-160 MG per tablet 1 tablet  Status:  Discontinued        1 tablet Oral Every 12 hours 04/06/20 1728 04/06/20 1729   04/06/20 2200  cephALEXin (KEFLEX) capsule 250 mg  Status:  Discontinued        250 mg Oral Every 12 hours 04/06/20 1729 04/06/20 1741   04/06/20 1830  cefTRIAXone (ROCEPHIN) 2 g in sodium chloride 0.9 % 100 mL IVPB        2 g 200 mL/hr over 30 Minutes Intravenous Every 24 hours 04/06/20 1741     04/06/20 1830  metroNIDAZOLE (FLAGYL) IVPB 500 mg        500 mg 100 mL/hr over 60 Minutes Intravenous Every 8 hours 04/06/20 1741     04/06/20 0000  sulfamethoxazole-trimethoprim (BACTRIM DS) 800-160 MG tablet  Status:  Discontinued        1 tablet Oral Every 12 hours 04/06/20 1728 04/06/20    04/06/20 0000  cephALEXin (KEFLEX) 250 MG capsule        250 mg Oral Every 12 hours 04/06/20 1730 04/11/20 2359   04/02/20 2200  cefTRIAXone (ROCEPHIN) 1 g in sodium chloride 0.9 % 100 mL IVPB        1 g 200 mL/hr over 30 Minutes Intravenous Every 24 hours 04/02/20 2016 04/04/20 2154   04/02/20 1415  aztreonam (AZACTAM) 1 g in sodium chloride 0.9 % 100 mL IVPB        1 g 200 mL/hr over 30 Minutes Intravenous  Once 04/02/20 1413 04/02/20  1514         Subjective: Seen and examined.  No overnight events.  Afebrile.  Wondering about ERCP.  Objective: Vitals:   04/09/20 1350 04/09/20 2046 04/10/20 0521 04/10/20 0747  BP: 120/67 136/73 132/66 (!) 142/60  Pulse: 60 62 64 63  Resp: 16 18 16    Temp: 97.7 F (36.5 C) 98.4 F (36.9 C) 98.1 F (36.7 C) 97.9 F (36.6 C)  TempSrc:  Oral Oral Oral  SpO2: 99% 97% 95% 96%  Weight:      Height:        Intake/Output Summary (Last 24 hours) at 04/10/2020 0955 Last data filed at 04/10/2020 0600 Gross per 24 hour  Intake 524.7 ml  Output 1150 ml  Net -625.3 ml   Filed Weights   04/02/20 2012  Weight: 44.4 kg    Examination:  General exam: Appears calm and comfortable  Respiratory system: Clear to auscultation. Respiratory effort normal. Cardiovascular system: S1 & S2 heard, RRR. No  JVD, murmurs, rubs, gallops or clicks. No pedal edema. Gastrointestinal system: Mild pain along the cholecystostomy tube.  Serosanguineous blood-tinged bile draining. Central nervous system: Alert and oriented. No focal neurological deficits. Extremities: Symmetric 5 x 5 power. Skin: No rashes, lesions or ulcers Psychiatry: Judgement and insight appear normal. Mood & affect appropriate.     Data Reviewed: I have personally reviewed following labs and imaging studies  CBC: Recent Labs  Lab 04/04/20 0515 04/04/20 0515 04/06/20 0427 04/07/20 0406 04/08/20 0330 04/09/20 0739 04/10/20 0409  WBC 16.4*   < > 19.5* 19.8* 17.2* 12.2* 10.2  NEUTROABS 15.0*  --  17.8* 18.2*  --  10.6* 8.5*  HGB 8.1*   < > 11.2* 10.4* 10.0* 11.1* 10.3*  HCT 25.6*   < > 33.4* 32.0* 30.6* 35.3* 32.2*  MCV 101.2*   < > 96.0 98.8 99.4 101.1* 100.6*  PLT 136*   < > 131* 159 173 216 221   < > = values in this interval not displayed.   Basic Metabolic Panel: Recent Labs  Lab 04/04/20 0515 04/06/20 0427 04/07/20 0406 04/08/20 0330 04/09/20 0739  NA 135 136 134* 135 141  K 3.9 3.3* 4.1 4.3 4.2  CL  106 105 107 108 110  CO2 20* 20* 18* 18* 19*  GLUCOSE 105* 110* 99 160* 98  BUN 42* 53* 50* 52* 40*  CREATININE 1.75* 1.73* 1.61* 1.68* 1.68*  CALCIUM 8.2* 8.2* 8.4* 8.2* 8.9  MG  --   --   --  2.0  --   PHOS  --   --   --  3.7  --    GFR: Estimated Creatinine Clearance: 16.2 mL/min (A) (by C-G formula based on SCr of 1.68 mg/dL (H)). Liver Function Tests: Recent Labs  Lab 04/07/20 0406 04/08/20 0330 04/09/20 0739  AST 18 17 17   ALT 26 24 20   ALKPHOS 167* 168* 158*  BILITOT 1.1 0.8 1.0  PROT 6.0* 5.7* 6.6  ALBUMIN 2.2* 2.0* 2.3*   No results for input(s): LIPASE, AMYLASE in the last 168 hours. No results for input(s): AMMONIA in the last 168 hours. Coagulation Profile: Recent Labs  Lab 04/07/20 1154 04/08/20 0330  INR 1.2 1.4*   Cardiac Enzymes: No results for input(s): CKTOTAL, CKMB, CKMBINDEX, TROPONINI in the last 168 hours. BNP (last 3 results) No results for input(s): PROBNP in the last 8760 hours. HbA1C: No results for input(s): HGBA1C in the last 72 hours. CBG: Recent Labs  Lab 04/03/20 1204 04/03/20 1712 04/06/20 1524  GLUCAP 136* 132* 106*   Lipid Profile: No results for input(s): CHOL, HDL, LDLCALC, TRIG, CHOLHDL, LDLDIRECT in the last 72 hours. Thyroid Function Tests: No results for input(s): TSH, T4TOTAL, FREET4, T3FREE, THYROIDAB in the last 72 hours. Anemia Panel: No results for input(s): VITAMINB12, FOLATE, FERRITIN, TIBC, IRON, RETICCTPCT in the last 72 hours. Sepsis Labs: No results for input(s): PROCALCITON, LATICACIDVEN in the last 168 hours.  Recent Results (from the past 240 hour(s))  Culture, blood (single)     Status: None   Collection Time: 04/02/20  1:05 PM   Specimen: BLOOD LEFT ARM  Result Value Ref Range Status   Specimen Description   Final    BLOOD LEFT ARM Performed at [facility], [address] [place], [place] [address]    Special Requests   Final    BOTTLES DRAWN AEROBIC AND ANAEROBIC Blood Culture  adequate volume Performed at [facility], [address] [place],  [place] [address]    Culture   Final    NO GROWTH 5 DAYS Performed at [facility], [place] [address] [place], [place] [address]    Report Status 04/07/2020 FINAL  Final  Urine culture     Status: Abnormal   Collection Time: 04/02/20  1:08 PM   Specimen: Urine, Random  Result Value Ref Range Status   Specimen Description   Final    URINE, RANDOM Performed at [facility], [address] [place], [place] [address]    Special Requests   Final    NONE Performed at [facility], [address] [place], [place] [address]    Culture MULTIPLE SPECIES PRESENT, SUGGEST RECOLLECTION (A)  Final   Report Status 04/03/2020 FINAL  Final  Resp Panel by RT PCR (RSV, Flu A&B, Covid) - Nasopharyngeal Swab     Status: None   Collection Time: 04/02/20  1:08 PM   Specimen: Nasopharyngeal Swab  Result Value Ref Range Status   SARS Coronavirus 2 by RT PCR NEGATIVE NEGATIVE Final    Comment: (NOTE) SARS-CoV-2 target nucleic acids are NOT DETECTED.  The SARS-CoV-2 RNA is generally detectable in upper respiratoy specimens during the acute phase of infection. The lowest concentration of SARS-CoV-2 viral copies this assay can detect is 131 copies/mL. A negative result does not preclude SARS-Cov-2 infection and should not be used as the sole basis for treatment or other patient management decisions. A negative result may occur with  improper specimen collection/handling, submission of specimen other than nasopharyngeal swab, presence of viral mutation(s) within the areas targeted by this assay, and inadequate number of viral copies (<131 copies/mL). A negative result must be combined with clinical observations, patient history, and epidemiological information. The expected result is Negative.  Fact Sheet for Patients:  PinkCheek.be  Fact Sheet for Healthcare  Providers:  GravelBags.it  This test is no t yet approved or cleared by the [place] FDA and  has been authorized for detection and/or diagnosis of SARS-CoV-2 by FDA under an Emergency Use Authorization (EUA). This EUA will remain  in effect (meaning this test can be used) for the duration of the COVID-19 declaration under Section 564(b)(1) of the Act, 21 U.S.C. section 360bbb-3(b)(1), unless the authorization is terminated or revoked sooner.     Influenza A by PCR NEGATIVE NEGATIVE Final   Influenza B by PCR NEGATIVE NEGATIVE Final    Comment: (NOTE) The Xpert Xpress SARS-CoV-2/FLU/RSV assay is intended as an aid in  the diagnosis of influenza from Nasopharyngeal swab specimens and  should not be used as a sole basis for treatment. Nasal washings and  aspirates are unacceptable for Xpert Xpress SARS-CoV-2/FLU/RSV  testing.  Fact Sheet for Patients: PinkCheek.be  Fact Sheet for Healthcare Providers: GravelBags.it  This test is not yet approved or cleared by the [place] FDA and  has been authorized for detection and/or diagnosis of SARS-CoV-2 by  FDA under an Emergency Use Authorization (EUA). This EUA will remain  in effect (meaning this test can be used) for the duration of the  Covid-19 declaration under Section 564(b)(1) of the Act, 21  U.S.C. section 360bbb-3(b)(1), unless the authorization is  terminated or revoked.    Respiratory Syncytial Virus by PCR NEGATIVE NEGATIVE Final    Comment: (NOTE) Fact Sheet for Patients: PinkCheek.be  Fact Sheet for Healthcare Providers: GravelBags.it  This test is not yet approved or cleared by the [place] FDA and  has been  authorized for detection and/or diagnosis of SARS-CoV-2 by  FDA under an Emergency Use Authorization (EUA). This EUA will remain  in effect (meaning this  test can be used) for the duration of the  COVID-19 declaration under Section 564(b)(1) of the Act, 21 U.S.C.  section 360bbb-3(b)(1), unless the authorization is terminated or  revoked. Performed at [facility], [address] [place], [place] [address]   Culture, blood (x 2)     Status: None   Collection Time: 04/02/20 10:05 PM   Specimen: BLOOD  Result Value Ref Range Status   Specimen Description   Final    BLOOD LEFT HAND Performed at [facility] [address] [place], [place] [address]    Special Requests   Final    BOTTLES DRAWN AEROBIC AND ANAEROBIC Blood Culture adequate volume Performed at [facility] [address] [place], [place] [address]    Culture   Final    NO GROWTH 5 DAYS Performed at [facility], [place] [address], [place], [place] [address]    Report Status 04/08/2020 FINAL  Final  Culture, blood (x 2)     Status: None   Collection Time: 04/02/20 10:05 PM   Specimen: BLOOD  Result Value Ref Range Status   Specimen Description   Final    BLOOD RIGHT HAND Performed at [facility] [address] [place], [place] [address]    Special Requests   Final    BOTTLES DRAWN AEROBIC AND ANAEROBIC Blood Culture adequate volume Performed at [facility] [address], [place], [place] [address]    Culture   Final    NO GROWTH 5 DAYS Performed at [facility], [place] [address] [place], [place] [address]    Report Status 04/08/2020 FINAL  Final  SARS Coronavirus 2 by RT PCR (hospital order, performed in [facility] hospital lab) Nasopharyngeal Nasopharyngeal Swab     Status: None   Collection Time: 04/06/20  2:46 PM   Specimen: Nasopharyngeal Swab  Result Value Ref Range Status   SARS Coronavirus 2 NEGATIVE NEGATIVE Final    Comment: (NOTE) SARS-CoV-2 target nucleic acids are NOT DETECTED.  The SARS-CoV-2 RNA is generally detectable in upper and  lower respiratory specimens during the acute phase of infection. The lowest concentration of SARS-CoV-2 viral copies this assay can detect is 250 copies / mL. A negative result does not preclude SARS-CoV-2 infection and should not be used as the sole basis for treatment or other patient management decisions.  A negative result may occur with improper specimen collection / handling, submission of specimen other than nasopharyngeal swab, presence of viral mutation(s) within the areas targeted by this assay, and inadequate number of viral copies (<250 copies / mL). A negative result must be combined with clinical observations, patient history, and epidemiological information.  Fact Sheet for Patients:   StrictlyIdeas.no  Fact Sheet for Healthcare Providers: BankingDealers.co.za  This test is not yet approved or  cleared by the [place] FDA and has been authorized for detection and/or diagnosis of SARS-CoV-2 by FDA under an Emergency Use Authorization (EUA).  This EUA will remain in effect (meaning this test can be used) for the duration of the COVID-19 declaration under Section 564(b)(1) of the Act, 21 U.S.C. section 360bbb-3(b)(1), unless the authorization is terminated or revoked sooner.  Performed at [facility], [place] [address], [place], [place] [address]   Aerobic/Anaerobic Culture (surgical/deep  wound)     Status: None (Preliminary result)   Collection Time: 04/08/20  5:00 PM   Specimen: BILE; Abscess  Result Value Ref Range Status   Specimen Description   Final    BILE ABSCESS Performed at [facility] [address] [place], [place] [address]    Special Requests   Final    NONE Performed at [facility], [place] [address] [place], [place] [address]    Gram Stain   Final    ABUNDANT WBC PRESENT, PREDOMINANTLY PMN ABUNDANT GRAM POSITIVE COCCI RARE GRAM NEGATIVE  RODS    Culture   Final    CULTURE REINCUBATED FOR BETTER GROWTH Performed at [facility], [place] [address] [place],  [address]    Report Status PENDING  Incomplete         Radiology Studies: IR Perc Cholecystostomy  Result Date: 04/09/2020 INDICATION: 86 year old female with acute calculus cholecystitis. She is not an operative candidate and therefore presents for placement of a percutaneous cholecystostomy tube. EXAM: CHOLECYSTOSTOMY MEDICATIONS: 1 g vancomycin; The antibiotic was administered within an appropriate time frame prior to the initiation of the procedure. ANESTHESIA/SEDATION: Moderate (conscious) sedation was employed during this procedure. A total of Versed 0.5 mg and Fentanyl 25 mcg and 0.5 mg Dilaudid was administered intravenously. Moderate Sedation Time: 11 minutes. The patient's level of consciousness and vital signs were monitored continuously by radiology nursing throughout the procedure under my direct supervision. FLUOROSCOPY TIME:  Fluoroscopy Time: 0 minutes 54 seconds (9 mGy). COMPLICATIONS: None immediate. PROCEDURE: Informed written consent was obtained from the patient after a thorough discussion of the procedural risks, benefits and alternatives. All questions were addressed. Maximal Sterile Barrier Technique was utilized including caps, mask, sterile gowns, sterile gloves, sterile drape, hand hygiene and skin antiseptic. A timeout was performed prior to the initiation of the procedure. The right upper quadrant was interrogated with ultrasound. The gallbladder is distended thick walled contains multiple echogenic foci consistent with stones. A suitable skin entry site was selected and marked. Local anesthesia was attained by infiltration with 1% lidocaine. A small dermatotomy was made. Under real-time ultrasound guidance, a 21 gauge Accustick needle was advanced along a short transhepatic tract and into the gallbladder lumen. A 0.018 wire was then coiled  in the gallbladder lumen. The needle was exchanged for the Accustick sheath dilator which was advanced into the gallbladder lumen. The 0.018 wire was then exchanged for a 75 cm superstiff Amplatz wire. The Accustick sheath was then removed and the skin and transhepatic tract dilated to 10 [place]. A Cook 10.2 [place] all-purpose drainage catheter was then advanced over the wire and formed in the gallbladder lumen. Aspiration yields frankly purulent bile. A sample was sent for Gram stain and culture. A gentle hand injection of contrast material was then performed opacifying the gallbladder lumen. Multiple faceted filling defects are present consistent with cholelithiasis. The tube was gently flushed with saline, connected to gravity bag drainage and secured to the skin with 0 Prolene suture. The patient tolerated the procedure well. IMPRESSION: Successful placement of a transhepatic percutaneous cholecystostomy tube for a diagnosis of acute calculus cholecystitis. PLAN: 1. Maintain drain to gravity bag. 2. Flush at least once per shift. 3. Cultures are pending from purulent bile. Signed, Criselda Peaches, MD, [facility] Electronically Signed   By: Jacqulynn Cadet M.D.   On: 04/09/2020 09:24        Scheduled Meds: .  lactose free nutrition  237 mL Oral BID BM  . levETIRAcetam  500 mg Oral BID  . levothyroxine  100 mcg Oral Q0600  . melatonin  10 mg Oral QHS  . metoprolol tartrate  25 mg Oral BID  . pantoprazole  40 mg Oral Daily  . polyethylene glycol  17 g Oral Daily  . sodium bicarbonate  650 mg Oral BID  . sodium chloride flush  5 mL Intracatheter Q8H   Continuous Infusions: . cefTRIAXone (ROCEPHIN)  IV 2 g (04/09/20 1730)  . metronidazole 500 mg (04/10/20 0239)     LOS: 8 days    Time spent: 30 minutes    Barb Merino, MD [facility] Pager [phone]

## 2020-04-11 DIAGNOSIS — K831 Obstruction of bile duct: Secondary | ICD-10-CM | POA: Diagnosis not present

## 2020-04-11 DIAGNOSIS — I4891 Unspecified atrial fibrillation: Secondary | ICD-10-CM | POA: Diagnosis not present

## 2020-04-11 LAB — CBC WITH DIFFERENTIAL/PLATELET
Abs Immature Granulocytes: 0.47 10*3/uL — ABNORMAL HIGH (ref 0.00–0.07)
Basophils Absolute: 0.1 10*3/uL (ref 0.0–0.1)
Basophils Relative: 1 %
Eosinophils Absolute: 0.2 10*3/uL (ref 0.0–0.5)
Eosinophils Relative: 2 %
HCT: 33.9 % — ABNORMAL LOW (ref 36.0–46.0)
Hemoglobin: 10.6 g/dL — ABNORMAL LOW (ref 12.0–15.0)
Immature Granulocytes: 5 %
Lymphocytes Relative: 6 %
Lymphs Abs: 0.6 10*3/uL — ABNORMAL LOW (ref 0.7–4.0)
MCH: 31.6 pg (ref 26.0–34.0)
MCHC: 31.3 g/dL (ref 30.0–36.0)
MCV: 101.2 fL — ABNORMAL HIGH (ref 80.0–100.0)
Monocytes Absolute: 0.5 10*3/uL (ref 0.1–1.0)
Monocytes Relative: 5 %
Neutro Abs: 8.7 10*3/uL — ABNORMAL HIGH (ref 1.7–7.7)
Neutrophils Relative %: 81 %
Platelets: 247 10*3/uL (ref 150–400)
RBC: 3.35 MIL/uL — ABNORMAL LOW (ref 3.87–5.11)
RDW: 13.7 % (ref 11.5–15.5)
WBC: 10.5 10*3/uL (ref 4.0–10.5)
nRBC: 0 % (ref 0.0–0.2)

## 2020-04-11 NOTE — Progress Notes (Addendum)
Patient ID: Glenda King, female   DOB: Apr 17, 1929, 86 y.o.   MRN: [id number]    Progress Note   Subjective   day # 8  CC; acute cholecystitis/pancreatic adenocarcinoma/biliary stricture  WBC 10.5/hemoglobin 10.6 Gallbladder abscess/mild cultures multiple organisms no anaerobes ID pending  Patient up in chair, eating breakfast and says she is feeling well.  No current complaints of abdominal pain.  Awaiting ERCP tomorrow and then hoping to be discharged.  Cholecystostomy tube with cloudy golden bile and streaks of heme   Objective   Vital signs in last 24 hours: Temp:  [98.4 F (36.9 C)-98.6 F (37 C)] 98.4 F (36.9 C) (10/31 0631) Pulse Rate:  [65-68] 67 (10/31 0631) Resp:  [16-17] 16 (10/31 0631) BP: (124-135)/(73-75) 135/74 (10/31 0631) SpO2:  [95 %-98 %] 96 % (10/31 0631) Last BM Date: 04/08/20 General: Elderly   white female in NAD Heart:  Regular rate and rhythm; no murmurs Lungs: Respirations even and unlabored, lungs CTA bilaterally Abdomen:  Soft, mildly tender in the right upper quadrant epigastrium, nondistended. Normal bowel sounds. Extremities:  Without edema. Neurologic:  Alert and oriented,  grossly normal neurologically. Psych:  Cooperative. Normal mood and affect.  Intake/Output from previous day: 10/30 0701 - 10/31 0700 In: 960 [P.O.:540; IV Piggyback:400] Out: 2205 [Urine:2150; Drains:55] Intake/Output this shift: No intake/output data recorded.  Lab Results: Recent Labs    04/09/20 0739 04/10/20 0409 04/11/20 0556  WBC 12.2* 10.2 10.5  HGB 11.1* 10.3* 10.6*  HCT 35.3* 32.2* 33.9*  PLT 216 221 247   BMET Recent Labs    04/09/20 0739  NA 141  K 4.2  CL 110  CO2 19*  GLUCOSE 98  BUN 40*  CREATININE 1.68*  CALCIUM 8.9   LFT Recent Labs    04/09/20 0739  PROT 6.6  ALBUMIN 2.3*  AST 17  ALT 20  ALKPHOS 158*  BILITOT 1.0       Assessment / Plan:    #84 86 year old white female with localized pancreatic head  adenocarcinoma diagnosed March 2021 status post ERCP with stent placement at that time (plastic stent) for CBD stricture Admitted now with acute calculus cholecystitis status post cholecystostomy tube placement 04/08/2020-gallbladder abscess. Final cultures pending bile fluid with multiple organisms  She is doing well, leukocytosis has resolved. On IV Rocephin and metronidazole IV  #2 atrial fibrillation 3.  History of bladder CA status post cystectomy 4.  Chronic kidney disease  Plan; n.p.o. after midnight Patient has been scheduled for ERCP with biliary stent exchange probably with metal stent tomorrow 04/12/2020 with Dr. Lyndel Safe.  Procedures been discussed in detail with the patient including indications risks and benefits and she is agreeable to proceed. Continue IV Rocephin and IV metronidazole through time of ERCP. Hopefully will be able to be discharged early this week.   LOS: 9 days   Amy Esterwood PA-C 04/11/2020, 8:53 AM    [facility] Attending   I have taken an interval history, reviewed the chart and examined the patient. I agree with the Advanced Practitioner's note, impression and recommendations.    She is improved overall. It is the better part of valor to replace this biliary stent to reduce the chance of recurrent admission to the hospital.  Gatha Mayer, MD, [facility] 04/11/2020 2:36 PM

## 2020-04-11 NOTE — H&P (View-Only) (Signed)
Patient ID: Glenda King, female   DOB: 07-12-1928, 86 y.o.   MRN: [id number]    Progress Note   Subjective   day # 8  CC; acute cholecystitis/pancreatic adenocarcinoma/biliary stricture  WBC 10.5/hemoglobin 10.6 Gallbladder abscess/mild cultures multiple organisms no anaerobes ID pending  Patient up in chair, eating breakfast and says she is feeling well.  No current complaints of abdominal pain.  Awaiting ERCP tomorrow and then hoping to be discharged.  Cholecystostomy tube with cloudy golden bile and streaks of heme   Objective   Vital signs in last 24 hours: Temp:  [98.4 F (36.9 C)-98.6 F (37 C)] 98.4 F (36.9 C) (10/31 0631) Pulse Rate:  [65-68] 67 (10/31 0631) Resp:  [16-17] 16 (10/31 0631) BP: (124-135)/(73-75) 135/74 (10/31 0631) SpO2:  [95 %-98 %] 96 % (10/31 0631) Last BM Date: 04/08/20 General: Elderly   white female in NAD Heart:  Regular rate and rhythm; no murmurs Lungs: Respirations even and unlabored, lungs CTA bilaterally Abdomen:  Soft, mildly tender in the right upper quadrant epigastrium, nondistended. Normal bowel sounds. Extremities:  Without edema. Neurologic:  Alert and oriented,  grossly normal neurologically. Psych:  Cooperative. Normal mood and affect.  Intake/Output from previous day: 10/30 0701 - 10/31 0700 In: 960 [P.O.:540; IV Piggyback:400] Out: 2205 [Urine:2150; Drains:55] Intake/Output this shift: No intake/output data recorded.  Lab Results: Recent Labs    04/09/20 0739 04/10/20 0409 04/11/20 0556  WBC 12.2* 10.2 10.5  HGB 11.1* 10.3* 10.6*  HCT 35.3* 32.2* 33.9*  PLT 216 221 247   BMET Recent Labs    04/09/20 0739  NA 141  K 4.2  CL 110  CO2 19*  GLUCOSE 98  BUN 40*  CREATININE 1.68*  CALCIUM 8.9   LFT Recent Labs    04/09/20 0739  PROT 6.6  ALBUMIN 2.3*  AST 17  ALT 20  ALKPHOS 158*  BILITOT 1.0       Assessment / Plan:    #84 86 year old white female with localized pancreatic head  adenocarcinoma diagnosed March 2021 status post ERCP with stent placement at that time (plastic stent) for CBD stricture Admitted now with acute calculus cholecystitis status post cholecystostomy tube placement 04/08/2020-gallbladder abscess. Final cultures pending bile fluid with multiple organisms  She is doing well, leukocytosis has resolved. On IV Rocephin and metronidazole IV  #2 atrial fibrillation 3.  History of bladder CA status post cystectomy 4.  Chronic kidney disease  Plan; n.p.o. after midnight Patient has been scheduled for ERCP with biliary stent exchange probably with metal stent tomorrow 04/12/2020 with Dr. Lyndel Safe.  Procedures been discussed in detail with the patient including indications risks and benefits and she is agreeable to proceed. Continue IV Rocephin and IV metronidazole through time of ERCP. Hopefully will be able to be discharged early this week.   LOS: 9 days   Amy Esterwood PA-C 04/11/2020, 8:53 AM    [facility] Attending   I have taken an interval history, reviewed the chart and examined the patient. I agree with the Advanced Practitioner's note, impression and recommendations.    She is improved overall. It is the better part of valor to replace this biliary stent to reduce the chance of recurrent admission to the hospital.  Gatha Mayer, MD, [facility] 04/11/2020 2:36 PM

## 2020-04-11 NOTE — Progress Notes (Signed)
PROGRESS NOTE    Glenda King  PPJ:[id number] DOB: 03-Sep-1928 DOA: 04/02/2020 PCP: Javier Glazier, MD    Brief Narrative:  86 year old female with history of localized pancreatic cancer status post radiosurgery, seizures, hypertension and hypothyroidism.  Admitted to the hospital with 3 days of fatigue, generalized weakness, lightheadedness and dizziness.  On the day of admission she was seen at oncologist office, found to have A. fib with RVR with heart rate more than 130 and referred to the emergency room.  Patient was treated with diltiazem infusion and IV heparin and admitted to the hospital.  10/26, patient was seen by physical therapy and she was discharged to a skilled nursing facility.  She was about to leave the hospital.  A repeat surveillance MRI of the abdomen was done that showed acute cholecystitis, hence discharge was canceled and surgery was consulted.  She did have persistent leukocytosis. 10/28, percutaneous cholecystostomy tube placed and clinically stabilizing. Clinically stabilizing.     Assessment & Plan:   Principal Problem:   New onset a-fib [facility]) Active Problems:   Malignant neoplasm of bladder, unspecified (HCC)   Hypothyroidism   Chronic kidney disease (CKD) stage G4/A1, severely decreased glomerular filtration rate (GFR) between 15-29 mL/min/1.73 square meter and albuminuria creatinine ratio less than 30 mg/g (HCC)   History of seizure   Anemia in chronic renal disease   History of urinary diversion procedure   Bile duct stricture   Primary pancreatic cancer (HCC)   Acute lower UTI   Elevated troponin   Protein-calorie malnutrition, severe  New onset A. fib: Currently sinus rhythm.  Rate well controlled on metoprolol.  Echocardiogram was normal.  Initially started on anticoagulation, however per oncology's recommendation, high risk of bleeding so this was discontinued.  Currently remains stable in sinus rhythm.  Acute calculus cholecystitis:  With persistent leukocytosis and history of localized pancreatic cancer, patient underwent MRI of the abdomen that was positive for acute calculus cholecystitis along with intra and extrahepatic biliary dilatation.  LFTs are normal. Seen by GI, she does have biliary stent, plan for stent exchange next week. Percutaneous cholecystostomy tube placement 10/28 by IR. Bile cultures with gram-positive and gram-negative cocci, on Rocephin and Flagyl.  WC count improved.  Received a dose of vancomycin perioperative.  We will continue on current antibiotics. Plan for ERCP and biliary stent exchange tomorrow morning.  UTI: Suspected present on admission.  Blood cultures and urine cultures no growth.  Treated with Rocephin, now continued to cover for intra-abdominal infection.  CKD stage IV: Her creatinine at about baseline.  We will continue to monitor.  Hypokalemia/hypomagnesemia: Replaced and adequate.  Pancreatic cancer: Status post radiation.  Followed by Dr. Marin Olp.  Also has anemia of chronic disease. Received 2 units of PRBC while in the hospital. Received 1 unit of IV iron.  Seizure disorder: Currently well controlled on Keppra.  Severe protein calorie malnutrition: Nutrition Status: Nutrition Problem: Severe Malnutrition Etiology: chronic illness, cancer and cancer related treatments Signs/Symptoms: moderate fat depletion, moderate muscle depletion, severe fat depletion, severe muscle depletion Interventions: Boost Plus  Continue mobilize.  Work with PT OT. Discharge pending GI procedure.   DVT prophylaxis: SCDs   Code Status: DNR Family Communication: None.  Patient communicating with family. Disposition Plan: Status is: Inpatient  Remains inpatient appropriate because:IV treatments appropriate due to intensity of illness or inability to take PO and Inpatient level of care appropriate due to severity of illness   Dispo: The patient is from: Home, independent living  Anticipated d/c is to: SNF.              Anticipated d/c date is: 2 to 3 days, after GI procedure.              Patient currently is not medically stable to d/c.  Plan for inpatient procedure before discharge.  Also needing IV antibiotics for acute cholecystitis.         Consultants:   Gastroenterology  General surgery  Oncology  Interventional radiology  Procedures:   None  Antimicrobials:  Anti-infectives (From admission, onward)   Start     Dose/Rate Route Frequency Ordered Stop   04/08/20 1645  vancomycin (VANCOCIN) IVPB 1000 mg/200 mL premix        1,000 mg 200 mL/hr over 60 Minutes Intravenous  Once 04/08/20 1627 04/08/20 1858   04/06/20 2200  sulfamethoxazole-trimethoprim (BACTRIM DS) 800-160 MG per tablet 1 tablet  Status:  Discontinued        1 tablet Oral Every 12 hours 04/06/20 1728 04/06/20 1729   04/06/20 2200  cephALEXin (KEFLEX) capsule 250 mg  Status:  Discontinued        250 mg Oral Every 12 hours 04/06/20 1729 04/06/20 1741   04/06/20 1830  cefTRIAXone (ROCEPHIN) 2 g in sodium chloride 0.9 % 100 mL IVPB        2 g 200 mL/hr over 30 Minutes Intravenous Every 24 hours 04/06/20 1741     04/06/20 1830  metroNIDAZOLE (FLAGYL) IVPB 500 mg        500 mg 100 mL/hr over 60 Minutes Intravenous Every 8 hours 04/06/20 1741     04/06/20 0000  sulfamethoxazole-trimethoprim (BACTRIM DS) 800-160 MG tablet  Status:  Discontinued        1 tablet Oral Every 12 hours 04/06/20 1728 04/06/20    04/06/20 0000  cephALEXin (KEFLEX) 250 MG capsule        250 mg Oral Every 12 hours 04/06/20 1730 04/11/20 2359   04/02/20 2200  cefTRIAXone (ROCEPHIN) 1 g in sodium chloride 0.9 % 100 mL IVPB        1 g 200 mL/hr over 30 Minutes Intravenous Every 24 hours 04/02/20 2016 04/04/20 2154   04/02/20 1415  aztreonam (AZACTAM) 1 g in sodium chloride 0.9 % 100 mL IVPB        1 g 200 mL/hr over 30 Minutes Intravenous  Once 04/02/20 1413 04/02/20 1514         Subjective: Seen and  examined.  No overnight events.  Looking forward for ERCP.  Objective: Vitals:   04/10/20 1345 04/10/20 2204 04/11/20 0631 04/11/20 0726  BP: 124/75 133/73 135/74   Pulse: 68 65 67   Resp: 17 16 16    Temp: 98.6 F (37 C) 98.4 F (36.9 C) 98.4 F (36.9 C)   TempSrc:  Oral Oral   SpO2: 98% 95% 96% 97%  Weight:      Height:        Intake/Output Summary (Last 24 hours) at 04/11/2020 1130 Last data filed at 04/11/2020 0600 Gross per 24 hour  Intake 240 ml  Output 1205 ml  Net -965 ml   Filed Weights   04/02/20 2012  Weight: 44.4 kg    Examination:  General exam: Appears calm and comfortable  Respiratory system: Clear to auscultation. Respiratory effort normal. Cardiovascular system: S1 & S2 heard, RRR. No JVD, murmurs, rubs, gallops or clicks. No pedal edema. Gastrointestinal system: Mild pain along the cholecystostomy tube.  Serosanguineous blood-tinged  bile draining. Central nervous system: Alert and oriented. No focal neurological deficits. Extremities: Symmetric 5 x 5 power. Skin: No rashes, lesions or ulcers Psychiatry: Judgement and insight appear normal. Mood & affect appropriate.     Data Reviewed: I have personally reviewed following labs and imaging studies  CBC: Recent Labs  Lab 04/06/20 0427 04/06/20 0427 04/07/20 0406 04/08/20 0330 04/09/20 0739 04/10/20 0409 04/11/20 0556  WBC 19.5*   < > 19.8* 17.2* 12.2* 10.2 10.5  NEUTROABS 17.8*  --  18.2*  --  10.6* 8.5* 8.7*  HGB 11.2*   < > 10.4* 10.0* 11.1* 10.3* 10.6*  HCT 33.4*   < > 32.0* 30.6* 35.3* 32.2* 33.9*  MCV 96.0   < > 98.8 99.4 101.1* 100.6* 101.2*  PLT 131*   < > 159 173 216 221 247   < > = values in this interval not displayed.   Basic Metabolic Panel: Recent Labs  Lab 04/06/20 0427 04/07/20 0406 04/08/20 0330 04/09/20 0739  NA 136 134* 135 141  K 3.3* 4.1 4.3 4.2  CL 105 107 108 110  CO2 20* 18* 18* 19*  GLUCOSE 110* 99 160* 98  BUN 53* 50* 52* 40*  CREATININE 1.73* 1.61*  1.68* 1.68*  CALCIUM 8.2* 8.4* 8.2* 8.9  MG  --   --  2.0  --   PHOS  --   --  3.7  --    GFR: Estimated Creatinine Clearance: 16.2 mL/min (A) (by C-G formula based on SCr of 1.68 mg/dL (H)). Liver Function Tests: Recent Labs  Lab 04/07/20 0406 04/08/20 0330 04/09/20 0739  AST 18 17 17   ALT 26 24 20   ALKPHOS 167* 168* 158*  BILITOT 1.1 0.8 1.0  PROT 6.0* 5.7* 6.6  ALBUMIN 2.2* 2.0* 2.3*   No results for input(s): LIPASE, AMYLASE in the last 168 hours. No results for input(s): AMMONIA in the last 168 hours. Coagulation Profile: Recent Labs  Lab 04/07/20 1154 04/08/20 0330  INR 1.2 1.4*   Cardiac Enzymes: No results for input(s): CKTOTAL, CKMB, CKMBINDEX, TROPONINI in the last 168 hours. BNP (last 3 results) No results for input(s): PROBNP in the last 8760 hours. HbA1C: No results for input(s): HGBA1C in the last 72 hours. CBG: Recent Labs  Lab 04/06/20 1524  GLUCAP 106*   Lipid Profile: No results for input(s): CHOL, HDL, LDLCALC, TRIG, CHOLHDL, LDLDIRECT in the last 72 hours. Thyroid Function Tests: No results for input(s): TSH, T4TOTAL, FREET4, T3FREE, THYROIDAB in the last 72 hours. Anemia Panel: No results for input(s): VITAMINB12, FOLATE, FERRITIN, TIBC, IRON, RETICCTPCT in the last 72 hours. Sepsis Labs: No results for input(s): PROCALCITON, LATICACIDVEN in the last 168 hours.  Recent Results (from the past 240 hour(s))  Culture, blood (single)     Status: None   Collection Time: 04/02/20  1:05 PM   Specimen: BLOOD LEFT ARM  Result Value Ref Range Status   Specimen Description   Final    BLOOD LEFT ARM Performed at [facility], [address] [place], [place] [address]    Special Requests   Final    BOTTLES DRAWN AEROBIC AND ANAEROBIC Blood Culture adequate volume Performed at [facility], [address] [place], [place] [address]    Culture   Final    NO GROWTH 5 DAYS Performed at [facility], [place] [address] [place],  [address]    Report Status 04/07/2020 FINAL  Final  Urine culture  Status: Abnormal   Collection Time: 04/02/20  1:08 PM   Specimen: Urine, Random  Result Value Ref Range Status   Specimen Description   Final    URINE, RANDOM Performed at [facility], [address] [place], [place] [address]    Special Requests   Final    NONE Performed at [facility], [address] [place], [place] [address]    Culture MULTIPLE SPECIES PRESENT, SUGGEST RECOLLECTION (A)  Final   Report Status 04/03/2020 FINAL  Final  Resp Panel by RT PCR (RSV, Flu A&B, Covid) - Nasopharyngeal Swab     Status: None   Collection Time: 04/02/20  1:08 PM   Specimen: Nasopharyngeal Swab  Result Value Ref Range Status   SARS Coronavirus 2 by RT PCR NEGATIVE NEGATIVE Final    Comment: (NOTE) SARS-CoV-2 target nucleic acids are NOT DETECTED.  The SARS-CoV-2 RNA is generally detectable in upper respiratoy specimens during the acute phase of infection. The lowest concentration of SARS-CoV-2 viral copies this assay can detect is 131 copies/mL. A negative result does not preclude SARS-Cov-2 infection and should not be used as the sole basis for treatment or other patient management decisions. A negative result may occur with  improper specimen collection/handling, submission of specimen other than nasopharyngeal swab, presence of viral mutation(s) within the areas targeted by this assay, and inadequate number of viral copies (<131 copies/mL). A negative result must be combined with clinical observations, patient history, and epidemiological information. The expected result is Negative.  Fact Sheet for Patients:  PinkCheek.be  Fact Sheet for Healthcare Providers:  GravelBags.it  This test is no t yet approved or cleared by the [place] FDA and  has been authorized for detection and/or diagnosis of SARS-CoV-2  by FDA under an Emergency Use Authorization (EUA). This EUA will remain  in effect (meaning this test can be used) for the duration of the COVID-19 declaration under Section 564(b)(1) of the Act, 21 U.S.C. section 360bbb-3(b)(1), unless the authorization is terminated or revoked sooner.     Influenza A by PCR NEGATIVE NEGATIVE Final   Influenza B by PCR NEGATIVE NEGATIVE Final    Comment: (NOTE) The Xpert Xpress SARS-CoV-2/FLU/RSV assay is intended as an aid in  the diagnosis of influenza from Nasopharyngeal swab specimens and  should not be used as a sole basis for treatment. Nasal washings and  aspirates are unacceptable for Xpert Xpress SARS-CoV-2/FLU/RSV  testing.  Fact Sheet for Patients: PinkCheek.be  Fact Sheet for Healthcare Providers: GravelBags.it  This test is not yet approved or cleared by the [place] FDA and  has been authorized for detection and/or diagnosis of SARS-CoV-2 by  FDA under an Emergency Use Authorization (EUA). This EUA will remain  in effect (meaning this test can be used) for the duration of the  Covid-19 declaration under Section 564(b)(1) of the Act, 21  U.S.C. section 360bbb-3(b)(1), unless the authorization is  terminated or revoked.    Respiratory Syncytial Virus by PCR NEGATIVE NEGATIVE Final    Comment: (NOTE) Fact Sheet for Patients: PinkCheek.be  Fact Sheet for Healthcare Providers: GravelBags.it  This test is not yet approved or cleared by the [place] FDA and  has been authorized for detection and/or diagnosis of SARS-CoV-2 by  FDA under an Emergency Use Authorization (EUA). This EUA will remain  in effect (meaning this test can be used) for the duration of the  COVID-19 declaration under Section 564(b)(1) of the Act, 21 U.S.C.  section 360bbb-3(b)(1), unless the authorization is terminated or   revoked. Performed at [facility], [address] [place], [place] [address]   Culture, blood (x 2)     Status: None   Collection Time: 04/02/20 10:05 PM   Specimen: BLOOD  Result Value Ref Range Status   Specimen Description   Final    BLOOD LEFT HAND Performed at [facility] [address], [place], [place] [address]    Special Requests   Final    BOTTLES DRAWN AEROBIC AND ANAEROBIC Blood Culture adequate volume Performed at [facility] [address] [place], [place] [address]    Culture   Final    NO GROWTH 5 DAYS Performed at [facility], [place] [address] [place], [place] [address]    Report Status 04/08/2020 FINAL  Final  Culture, blood (x 2)     Status: None   Collection Time: 04/02/20 10:05 PM   Specimen: BLOOD  Result Value Ref Range Status   Specimen Description   Final    BLOOD RIGHT HAND Performed at [facility] [address], [place], [place] [address]    Special Requests   Final    BOTTLES DRAWN AEROBIC AND ANAEROBIC Blood Culture adequate volume Performed at [facility] [address], [place], [place] [address]    Culture   Final    NO GROWTH 5 DAYS Performed at [facility], [place] [address] [place], [place] [address]    Report Status 04/08/2020 FINAL  Final  SARS Coronavirus 2 by RT PCR (hospital order, performed in [facility] hospital lab) Nasopharyngeal Nasopharyngeal Swab     Status: None   Collection Time: 04/06/20  2:46 PM   Specimen: Nasopharyngeal Swab  Result Value Ref Range Status   SARS Coronavirus 2 NEGATIVE NEGATIVE Final    Comment: (NOTE) SARS-CoV-2 target nucleic acids are NOT DETECTED.  The SARS-CoV-2 RNA is generally detectable in upper and lower respiratory specimens during the acute phase of infection. The lowest concentration of SARS-CoV-2 viral copies this assay can detect is 250 copies / mL. A negative result does not  preclude SARS-CoV-2 infection and should not be used as the sole basis for treatment or other patient management decisions.  A negative result may occur with improper specimen collection / handling, submission of specimen other than nasopharyngeal swab, presence of viral mutation(s) within the areas targeted by this assay, and inadequate number of viral copies (<250 copies / mL). A negative result must be combined with clinical observations, patient history, and epidemiological information.  Fact Sheet for Patients:   StrictlyIdeas.no  Fact Sheet for Healthcare Providers: BankingDealers.co.za  This test is not yet approved or  cleared by the [place] FDA and has been authorized for detection and/or diagnosis of SARS-CoV-2 by FDA under an Emergency Use Authorization (EUA).  This EUA will remain in effect (meaning this test can be used) for the duration of the COVID-19 declaration under Section 564(b)(1) of the Act, 21 U.S.C. section 360bbb-3(b)(1), unless the authorization is terminated or revoked sooner.  Performed at [facility], [place] [address], [place], [place] [address]   Aerobic/Anaerobic Culture (surgical/deep wound)     Status: Abnormal (Preliminary result)   Collection Time: 04/08/20  5:00 PM   Specimen: BILE; Abscess  Result Value Ref Range Status   Specimen Description   Final    BILE ABSCESS Performed at [facility],  [place] [address], [place], [place] [address]    Special Requests   Final    NONE Performed at [facility], [place] [address], [place], [place] [address]    Gram Stain   Final    ABUNDANT WBC PRESENT, PREDOMINANTLY PMN ABUNDANT GRAM POSITIVE COCCI RARE GRAM NEGATIVE RODS Performed at [facility], [place] [address], [place], [place] [address]    Culture (A)  Final    MULTIPLE ORGANISMS PRESENT, NONE PREDOMINANT NO ANAEROBES ISOLATED; CULTURE  IN PROGRESS FOR 5 DAYS    Report Status PENDING  Incomplete         Radiology Studies: No results found.      Scheduled Meds: . lactose free nutrition  237 mL Oral BID BM  . levETIRAcetam  500 mg Oral BID  . levothyroxine  100 mcg Oral Q0600  . melatonin  10 mg Oral QHS  . metoprolol tartrate  25 mg Oral BID  . pantoprazole  40 mg Oral Daily  . polyethylene glycol  17 g Oral Daily  . sodium bicarbonate  650 mg Oral BID  . sodium chloride flush  5 mL Intracatheter Q8H   Continuous Infusions: . cefTRIAXone (ROCEPHIN)  IV 2 g (04/10/20 1911)  . metronidazole Stopped (04/11/20 1025)     LOS: 9 days    Time spent: 30 minutes    Barb Merino, MD [facility] Pager [phone]

## 2020-04-11 NOTE — Progress Notes (Signed)
Referring Physician(s): Jory Ee NP  Supervising Physician: Dr. Earleen Newport  Patient Status:  [facility] - In-pt  Chief Complaint:  Abdominal pain found to have acute cholecystitis s/p cholecystomy tube placed on 10.28.21 by Dr. Shellia Cleverly.  Subjective:  Patient alert  sitting in recliner calm and comfortable. Denies any fevers, headache, chest pain, SOB, cough. Patient reporting improved abdominal pain since placement of cholecystomy tube on 10/28. Patient tolerating a diet at this time. Denies and nausea or vomiting  Allergies: Gadolinium derivatives, Amoxicillin, and Pollen extract  Medications:  Current Facility-Administered Medications:  .  acetaminophen (TYLENOL) tablet 650 mg, 650 mg, Oral, Q6H PRN **OR** acetaminophen (TYLENOL) suppository 650 mg, 650 mg, Rectal, Q6H PRN, Doutova, Anastassia, MD .  cefTRIAXone (ROCEPHIN) 2 g in sodium chloride 0.9 % 100 mL IVPB, 2 g, Intravenous, Q24H, Arrien, Jimmy Picket, MD, Last Rate: 200 mL/hr at 04/10/20 1911, 2 g at 04/10/20 1911 .  lactose free nutrition (BOOST PLUS) liquid 237 mL, 237 mL, Oral, BID BM, Arrien, Jimmy Picket, MD, 237 mL at 04/11/20 1022 .  levETIRAcetam (KEPPRA) tablet 500 mg, 500 mg, Oral, BID, Doutova, Anastassia, MD, 500 mg at 04/11/20 0903 .  levothyroxine (SYNTHROID) tablet 100 mcg, 100 mcg, Oral, Q0600, Doutova, Anastassia, MD, 100 mcg at 04/11/20 2426 .  melatonin tablet 10 mg, 10 mg, Oral, QHS, Doutova, Anastassia, MD, 10 mg at 04/10/20 2045 .  metoprolol tartrate (LOPRESSOR) injection 5 mg, 5 mg, Intravenous, Q4H PRN, Arrien, Jimmy Picket, MD .  metoprolol tartrate (LOPRESSOR) tablet 25 mg, 25 mg, Oral, BID, Arrien, Jimmy Picket, MD, 25 mg at 04/11/20 0903 .  metroNIDAZOLE (FLAGYL) IVPB 500 mg, 500 mg, Intravenous, Q8H, Arrien, Jimmy Picket, MD, Stopped at 04/11/20 1025 .  morphine CONCENTRATE 10 MG/0.5ML oral solution 5 mg, 5 mg, Oral, Q2H PRN, [name], MD, 5 mg at 04/07/20 0258 .   pantoprazole (PROTONIX) EC tablet 40 mg, 40 mg, Oral, Daily, Arrien, Jimmy Picket, MD, 40 mg at 04/11/20 0903 .  polyethylene glycol (MIRALAX / GLYCOLAX) packet 17 g, 17 g, Oral, Daily, Barb Merino, MD, 17 g at 04/11/20 0903 .  sodium bicarbonate tablet 650 mg, 650 mg, Oral, BID, Doutova, Anastassia, MD, 650 mg at 04/11/20 0903 .  sodium chloride flush (NS) 0.9 % injection 5 mL, 5 mL, Intracatheter, Q8H, Criselda Peaches, MD, 5 mL at 04/11/20 0628    Vital Signs: BP 135/74 (BP Location: Left Arm)   Pulse 67   Temp 98.4 F (36.9 C) (Oral)   Resp 16   Ht 5' 8.4" (1.737 m)   Wt 44.4 kg   SpO2 96%   BMI 14.71 kg/m   Physical Exam Vitals and nursing note reviewed.  Constitutional:      Appearance: She is well-developed.  HENT:     Head: Normocephalic and atraumatic.  Eyes:     Conjunctiva/sclera: Conjunctivae normal.  Pulmonary:     Effort: Pulmonary effort is normal.  Abdominal:     Comments: RUQ drain to gravity bag.  Site is unremarkable with no erythema, edema, tenderness, bleeding or drainage noted at exit site. Suture and stat lock in place. Dressing is clean dry and intact. Thin pale bilious output Drain is able to be flushed easily.  Musculoskeletal:     Cervical back: Normal range of motion.  Skin:    General: Skin is warm.  Neurological:     Mental Status: She is alert and oriented to person, place, and time.     Imaging:  IR Perc Cholecystostomy  Result Date: 04/09/2020 INDICATION: 86 year old female with acute calculus cholecystitis. She is not an operative candidate and therefore presents for placement of a percutaneous cholecystostomy tube. EXAM: CHOLECYSTOSTOMY MEDICATIONS: 1 g vancomycin; The antibiotic was administered within an appropriate time frame prior to the initiation of the procedure. ANESTHESIA/SEDATION: Moderate (conscious) sedation was employed during this procedure. A total of Versed 0.5 mg and Fentanyl 25 mcg and 0.5 mg Dilaudid was  administered intravenously. Moderate Sedation Time: 11 minutes. The patient's level of consciousness and vital signs were monitored continuously by radiology nursing throughout the procedure under my direct supervision. FLUOROSCOPY TIME:  Fluoroscopy Time: 0 minutes 54 seconds (9 mGy). COMPLICATIONS: None immediate. PROCEDURE: Informed written consent was obtained from the patient after a thorough discussion of the procedural risks, benefits and alternatives. All questions were addressed. Maximal Sterile Barrier Technique was utilized including caps, mask, sterile gowns, sterile gloves, sterile drape, hand hygiene and skin antiseptic. A timeout was performed prior to the initiation of the procedure. The right upper quadrant was interrogated with ultrasound. The gallbladder is distended thick walled contains multiple echogenic foci consistent with stones. A suitable skin entry site was selected and marked. Local anesthesia was attained by infiltration with 1% lidocaine. A small dermatotomy was made. Under real-time ultrasound guidance, a 21 gauge Accustick needle was advanced along a short transhepatic tract and into the gallbladder lumen. A 0.018 wire was then coiled in the gallbladder lumen. The needle was exchanged for the Accustick sheath dilator which was advanced into the gallbladder lumen. The 0.018 wire was then exchanged for a 75 cm superstiff Amplatz wire. The Accustick sheath was then removed and the skin and transhepatic tract dilated to 10 [place]. A Cook 10.2 [place] all-purpose drainage catheter was then advanced over the wire and formed in the gallbladder lumen. Aspiration yields frankly purulent bile. A sample was sent for Gram stain and culture. A gentle hand injection of contrast material was then performed opacifying the gallbladder lumen. Multiple faceted filling defects are present consistent with cholelithiasis. The tube was gently flushed with saline, connected to gravity bag drainage and  secured to the skin with 0 Prolene suture. The patient tolerated the procedure well. IMPRESSION: Successful placement of a transhepatic percutaneous cholecystostomy tube for a diagnosis of acute calculus cholecystitis. PLAN: 1. Maintain drain to gravity bag. 2. Flush at least once per shift. 3. Cultures are pending from purulent bile. Signed, Criselda Peaches, MD, [facility] Radiology Electronically Signed   By: Jacqulynn Cadet M.D.   On: 04/09/2020 09:24    Labs:  CBC: Recent Labs    04/08/20 0330 04/09/20 0739 04/10/20 0409 04/11/20 0556  WBC 17.2* 12.2* 10.2 10.5  HGB 10.0* 11.1* 10.3* 10.6*  HCT 30.6* 35.3* 32.2* 33.9*  PLT 173 216 221 247    COAGS: Recent Labs    09/04/19 0753 04/02/20 1308 04/07/20 1154 04/08/20 0330  INR 1.1 1.2 1.2 1.4*  APTT  --  39*  --   --     BMP: Recent Labs    12/10/19 1216 12/10/19 1216 01/19/20 1356 01/19/20 1356 02/27/20 1301 02/27/20 1301 03/12/20 1109 04/02/20 1042 04/06/20 0427 04/07/20 0406 04/08/20 0330 04/09/20 0739  NA 140   < > 140   < > 137   < > 138   < > 136 134* 135 141  K 4.8   < > 5.1   < > 5.1   < > 4.8   < >  3.3* 4.1 4.3 4.2  CL 100   < > 105   < > 102   < > 103   < > 105 107 108 110  CO2 29   < > 28   < > 28   < > 29   < > 20* 18* 18* 19*  GLUCOSE 114*   < > 131*   < > 199*   < > 251*   < > 110* 99 160* 98  BUN 42*   < > 48*   < > 45*   < > 49*   < > 53* 50* 52* 40*  CALCIUM 10.1   < > 9.7   < > 9.6   < > 9.9   < > 8.2* 8.4* 8.2* 8.9  CREATININE 1.72*   < > 1.56*   < > 1.73*   < > 1.61*   < > 1.73* 1.61* 1.68* 1.68*  GFRNONAA 26*   < > 29*   < > 26*   < > 28*   < > 28* 31* 29* 29*  GFRAA 30*  --  34*  --  30*  --  33*  --   --   --   --   --    < > = values in this interval not displayed.    LIVER FUNCTION TESTS: Recent Labs    04/03/20 0024 04/07/20 0406 04/08/20 0330 04/09/20 0739  BILITOT 2.7* 1.1 0.8 1.0  AST 32 18 17 17   ALT 86* 26 24  20   ALKPHOS 206* 167* 168* 158*  PROT 6.3* 6.0* 5.7* 6.6  ALBUMIN 2.7* 2.2* 2.0* 2.3*    Assessment and Plan:  62 y,o female inpatient. History of localized pancreatic cancer s/p pancreatic stent placement in March. Admitted to [facility] for fatigue, weakness found to have a. fib with RVR. Hospital stay complicated by abdominal pain. MRI performed on 10/26 indicative for  acute cholecystitis. IR placed a choleystostomy tube on 10/28  Per note from [facility] patient to follow- up with Dr. Harlow Asa in 4 weeks.   Cholecystomy tube drain will need to remain in place at least 4-6 weeks unless gallbladder surgically removed in interim; continue tid drain flushes in house and once daily with 5 ml sterile normal saline as outpatient; will schedule f/u cholangiogram in 4-6 weeks. IR scheduler will call with appointment date and time.    Please call IR with questions/concerns.     Electronically Signed: Ascencion Dike, PA-C 04/11/2020, 11:05 AM   I spent a total of 15 Minutes at the patient's bedside AND on the patient's hospital floor or unit, greater than 50% of which was counseling/coordinating care for cholecystomy tube placement.

## 2020-04-12 ENCOUNTER — Encounter (HOSPITAL_COMMUNITY): Payer: Self-pay | Admitting: Internal Medicine

## 2020-04-12 ENCOUNTER — Inpatient Hospital Stay (HOSPITAL_COMMUNITY): Payer: Medicare Other | Admitting: Certified Registered"

## 2020-04-12 ENCOUNTER — Encounter (HOSPITAL_COMMUNITY)
Admission: EM | Disposition: A | Discharge status: Skilled Nursing Facility | Payer: Self-pay | Source: Ambulatory Visit | Attending: Internal Medicine

## 2020-04-12 ENCOUNTER — Ambulatory Visit (HOSPITAL_COMMUNITY): Payer: Medicare Other

## 2020-04-12 ENCOUNTER — Inpatient Hospital Stay (HOSPITAL_COMMUNITY): Payer: Medicare Other

## 2020-04-12 DIAGNOSIS — K315 Obstruction of duodenum: Secondary | ICD-10-CM

## 2020-04-12 DIAGNOSIS — Z4659 Encounter for fitting and adjustment of other gastrointestinal appliance and device: Secondary | ICD-10-CM

## 2020-04-12 DIAGNOSIS — I4891 Unspecified atrial fibrillation: Secondary | ICD-10-CM | POA: Diagnosis not present

## 2020-04-12 HISTORY — PX: BILIARY STENT PLACEMENT: SHX5538

## 2020-04-12 HISTORY — PX: BALLOON DILATION: SHX5330

## 2020-04-12 HISTORY — PX: STENT REMOVAL: SHX6421

## 2020-04-12 HISTORY — PX: ERCP: SHX5425

## 2020-04-12 LAB — CBC WITH DIFFERENTIAL/PLATELET
Abs Immature Granulocytes: 0.41 10*3/uL — ABNORMAL HIGH (ref 0.00–0.07)
Basophils Absolute: 0.1 10*3/uL (ref 0.0–0.1)
Basophils Relative: 1 %
Eosinophils Absolute: 0.2 10*3/uL (ref 0.0–0.5)
Eosinophils Relative: 2 %
HCT: 33.3 % — ABNORMAL LOW (ref 36.0–46.0)
Hemoglobin: 10.5 g/dL — ABNORMAL LOW (ref 12.0–15.0)
Immature Granulocytes: 4 %
Lymphocytes Relative: 6 %
Lymphs Abs: 0.6 10*3/uL — ABNORMAL LOW (ref 0.7–4.0)
MCH: 32.1 pg (ref 26.0–34.0)
MCHC: 31.5 g/dL (ref 30.0–36.0)
MCV: 101.8 fL — ABNORMAL HIGH (ref 80.0–100.0)
Monocytes Absolute: 0.5 10*3/uL (ref 0.1–1.0)
Monocytes Relative: 5 %
Neutro Abs: 8.2 10*3/uL — ABNORMAL HIGH (ref 1.7–7.7)
Neutrophils Relative %: 82 %
Platelets: 238 10*3/uL (ref 150–400)
RBC: 3.27 MIL/uL — ABNORMAL LOW (ref 3.87–5.11)
RDW: 13.6 % (ref 11.5–15.5)
WBC: 10 10*3/uL (ref 4.0–10.5)
nRBC: 0 % (ref 0.0–0.2)

## 2020-04-12 LAB — COMPREHENSIVE METABOLIC PANEL
ALT: 17 U/L (ref 0–44)
AST: 17 U/L (ref 15–41)
Albumin: 2.3 g/dL — ABNORMAL LOW (ref 3.5–5.0)
Alkaline Phosphatase: 114 U/L (ref 38–126)
Anion gap: 6 (ref 5–15)
BUN: 28 mg/dL — ABNORMAL HIGH (ref 8–23)
CO2: 19 mmol/L — ABNORMAL LOW (ref 22–32)
Calcium: 8.3 mg/dL — ABNORMAL LOW (ref 8.9–10.3)
Chloride: 114 mmol/L — ABNORMAL HIGH (ref 98–111)
Creatinine, Ser: 1.42 mg/dL — ABNORMAL HIGH (ref 0.44–1.00)
GFR, Estimated: 36 mL/min — ABNORMAL LOW (ref >60)
Glucose, Bld: 115 mg/dL — ABNORMAL HIGH (ref 70–99)
Potassium: 4.2 mmol/L (ref 3.5–5.1)
Sodium: 139 mmol/L (ref 135–145)
Total Bilirubin: 0.8 mg/dL (ref 0.3–1.2)
Total Protein: 5.9 g/dL — ABNORMAL LOW (ref 6.5–8.1)

## 2020-04-12 LAB — RESPIRATORY PANEL BY RT PCR (FLU A&B, COVID)
Influenza A by PCR: NEGATIVE
Influenza B by PCR: NEGATIVE
SARS Coronavirus 2 by RT PCR: NEGATIVE

## 2020-04-12 IMAGING — RF DG ERCP WO/W SPHINCTEROTOMY
1 series · 14 of 14 positions shown · non-contrast
Comparison: MRCP [DATE]

CLINICAL DATA: 89-year-old female with malignant obstructed
jaundice secondary to pancreatic cancer.

EXAM:
ERCP
TECHNIQUE: Multiple spot images obtained with the fluoroscopic device and
submitted for interpretation post-procedure.
FLUOROSCOPY TIME:  Fluoroscopy Time:  3 minutes 15 seconds

[Series 1: run · 14 of 14 slices shown]
[im 1/14]
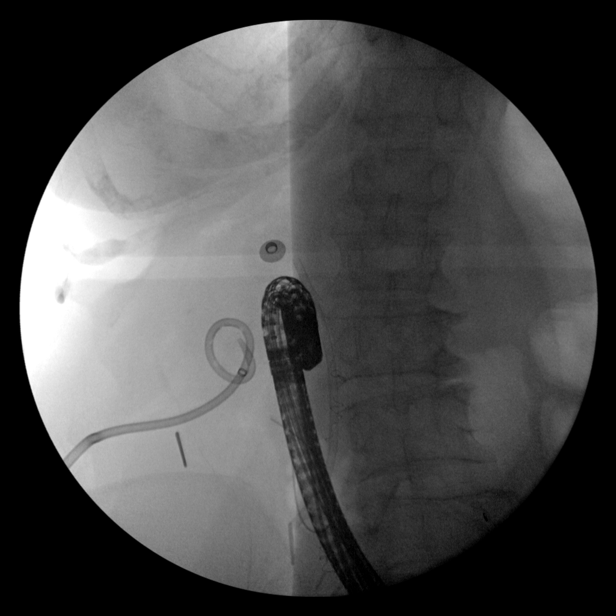
[im 2/14]
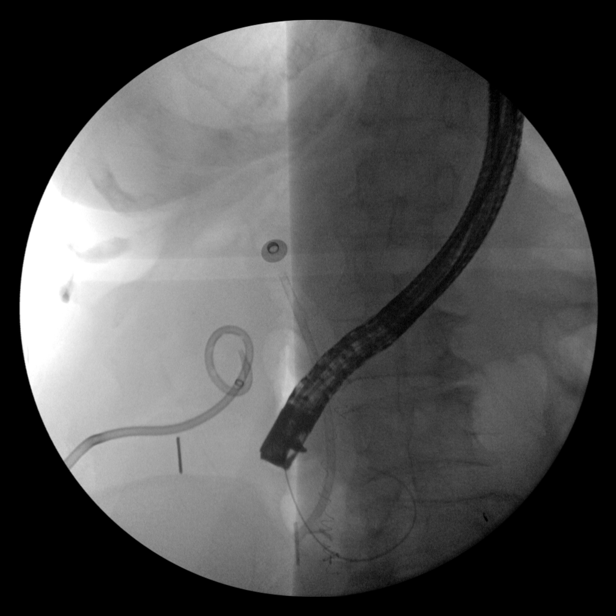
[im 3/14]
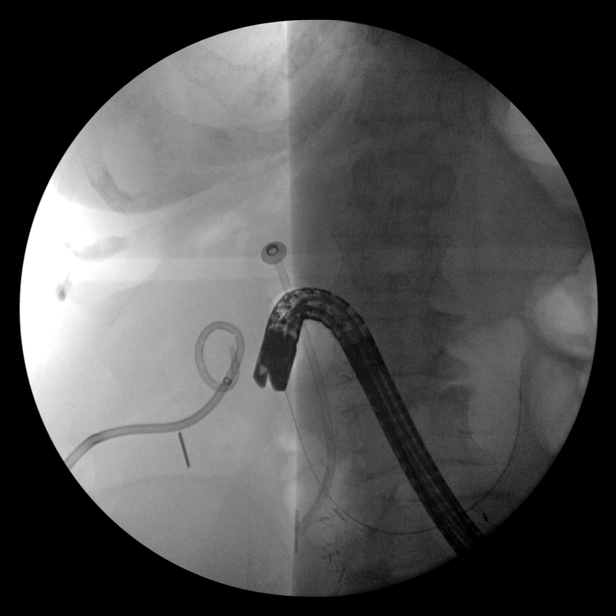
[im 4/14]
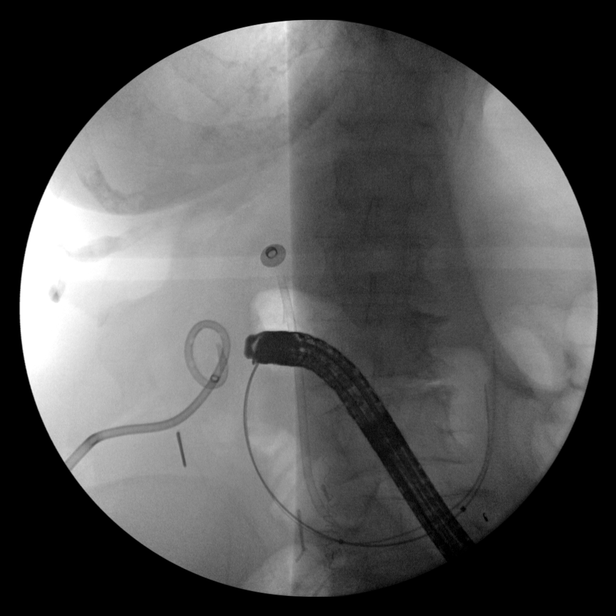
[im 5/14]
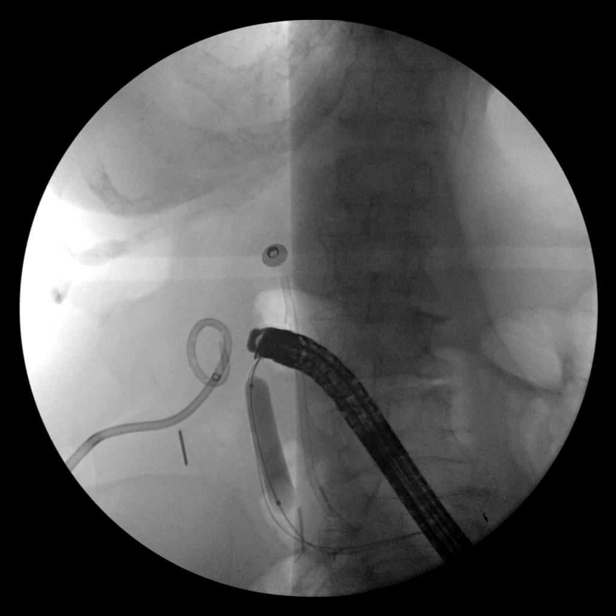
[im 6/14]
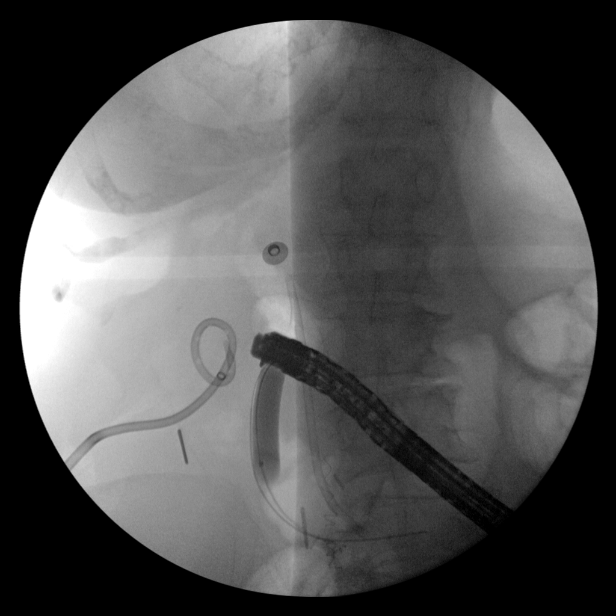
[im 7/14]
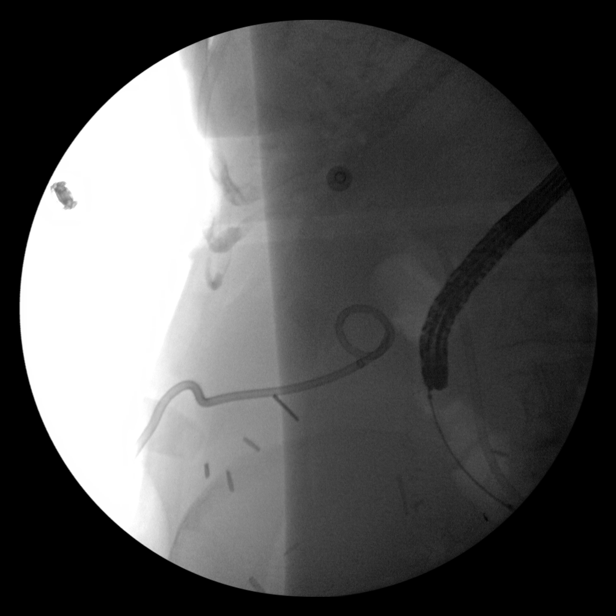
[im 8/14]
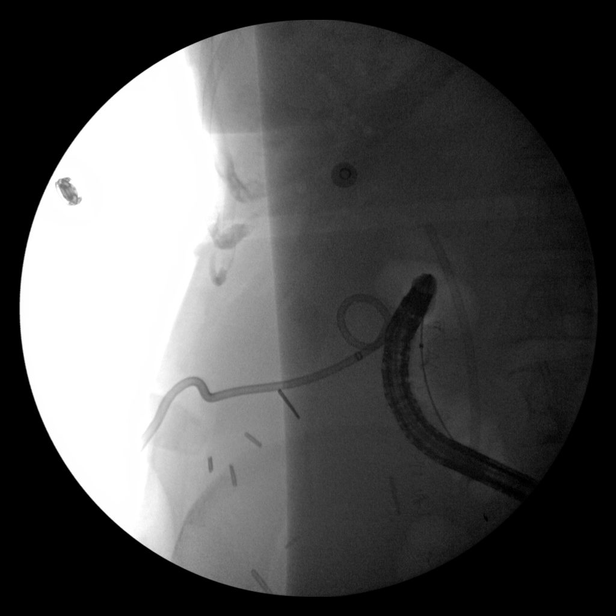
[im 9/14]
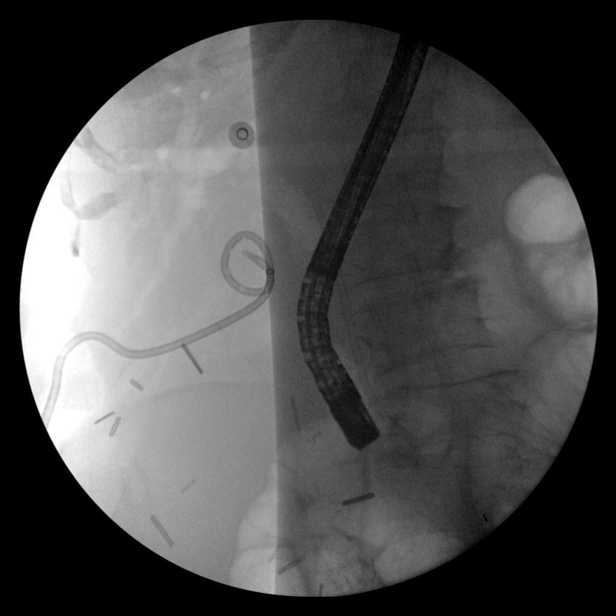
[im 10/14]
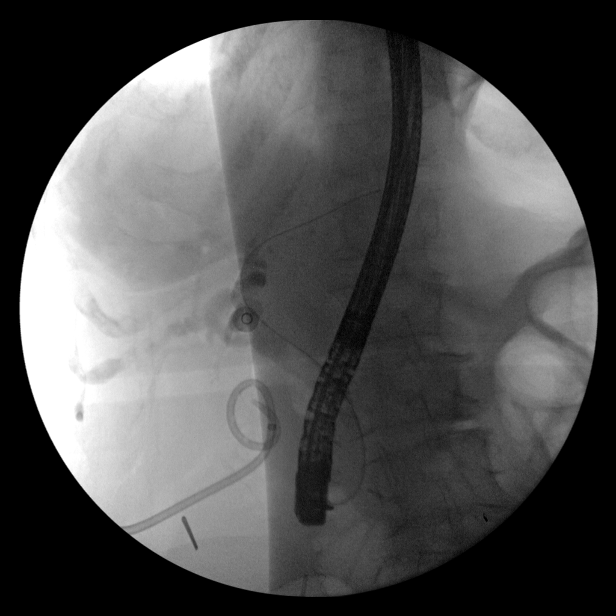
[im 11/14]
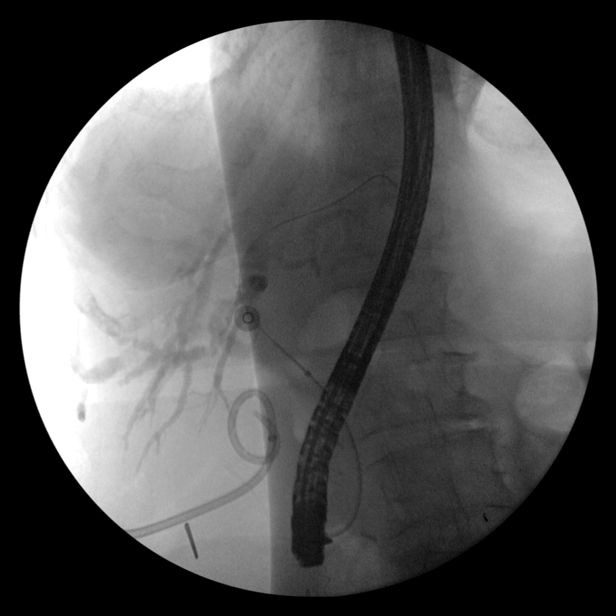
[im 12/14]
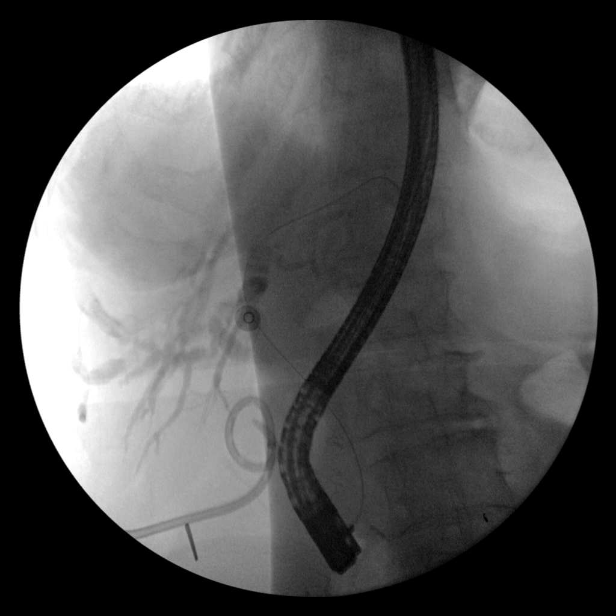
[im 13/14]
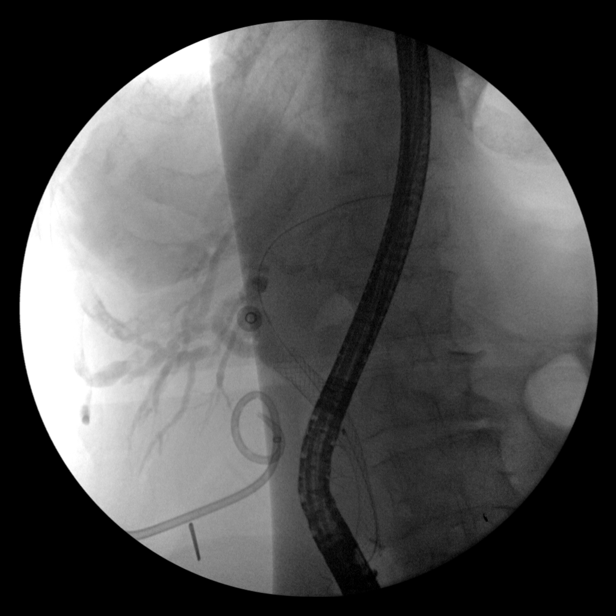
[im 14/14]
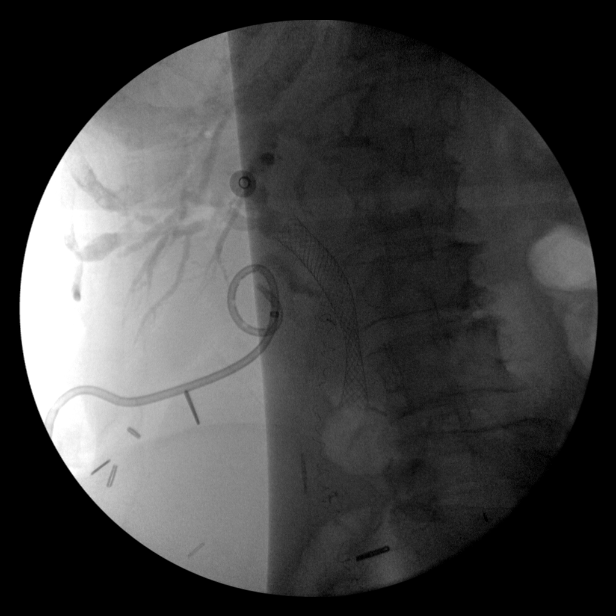

[14 of 14 positions shown; findings below may reference images not displayed]

FINDINGS: A total of 14 intraoperative saved images are submitted for review.
The initial images demonstrate a flexible duodenal scope in the
descending duodenum. A percutaneous cholecystostomy tube is present.
Additionally, there is a plastic biliary drainage catheter.
Subsequent images demonstrate balloon dilation of the descending
duodenum. Following this maneuver, next the previously placed
plastic biliary stent was removed and there is deep wire cannulation
of the left intrahepatic ducts. Subsequent images demonstrate
introduction of a metallic biliary stent which is then deployed
across a stenosis in the distal common bile duct. The stent is
partially open on the final image.
IMPRESSION: 1. Balloon dilation of the descending duodenum.
2. Removal of plastic biliary stent and placement of a self
expanding metal biliary stent.

These images were submitted for radiologic interpretation only.
Please see the procedural report for the amount of contrast and the
fluoroscopy time utilized.

## 2020-04-12 SURGERY — ERCP, WITH INTERVENTION IF INDICATED
Anesthesia: General

## 2020-04-12 MED ORDER — PROPOFOL 10 MG/ML IV BOLUS
INTRAVENOUS | Status: AC
  Filled 2020-04-12: qty 20

## 2020-04-12 MED ORDER — SUCCINYLCHOLINE CHLORIDE 20 MG/ML IJ SOLN
INTRAMUSCULAR | Status: DC | PRN
  Administered 2020-04-12: 50 mg via INTRAVENOUS

## 2020-04-12 MED ORDER — GLUCAGON HCL RDNA (DIAGNOSTIC) 1 MG IJ SOLR
INTRAMUSCULAR | Status: DC | PRN
  Administered 2020-04-12: .2 mg via INTRAVENOUS

## 2020-04-12 MED ORDER — GLUCAGON HCL RDNA (DIAGNOSTIC) 1 MG IJ SOLR
INTRAMUSCULAR | Status: AC
  Filled 2020-04-12: qty 1

## 2020-04-12 MED ORDER — FENTANYL CITRATE (PF) 100 MCG/2ML IJ SOLN
INTRAMUSCULAR | Status: AC
  Filled 2020-04-12: qty 2

## 2020-04-12 MED ORDER — ONDANSETRON HCL 4 MG/2ML IJ SOLN
INTRAMUSCULAR | Status: DC | PRN
  Administered 2020-04-12: 4 mg via INTRAVENOUS

## 2020-04-12 MED ORDER — LIDOCAINE HCL (CARDIAC) PF 100 MG/5ML IV SOSY
PREFILLED_SYRINGE | INTRAVENOUS | Status: DC | PRN
  Administered 2020-04-12: 40 mg via INTRAVENOUS
  Administered 2020-04-12: 20 mg via INTRAVENOUS

## 2020-04-12 MED ORDER — PHENYLEPHRINE HCL-NACL 10-0.9 MG/250ML-% IV SOLN
INTRAVENOUS | Status: DC | PRN
  Administered 2020-04-12: 30 ug/min via INTRAVENOUS

## 2020-04-12 MED ORDER — LIP MEDEX EX OINT: TOPICAL_OINTMENT | CUTANEOUS | Status: DC | PRN

## 2020-04-12 MED ORDER — PROPOFOL 10 MG/ML IV BOLUS
INTRAVENOUS | Status: DC | PRN
  Administered 2020-04-12: 100 mg via INTRAVENOUS

## 2020-04-12 MED ORDER — PHENYLEPHRINE HCL (PRESSORS) 10 MG/ML IV SOLN
INTRAVENOUS | Status: DC | PRN
  Administered 2020-04-12 (×2): 80 ug via INTRAVENOUS
  Administered 2020-04-12: 120 ug via INTRAVENOUS
  Administered 2020-04-12 (×3): 80 ug via INTRAVENOUS
  Administered 2020-04-12: 40 ug via INTRAVENOUS
  Administered 2020-04-12 (×2): 80 ug via INTRAVENOUS

## 2020-04-12 MED ORDER — INDOMETHACIN 50 MG RE SUPP
RECTAL | Status: AC
  Filled 2020-04-12: qty 2

## 2020-04-12 MED ORDER — EPHEDRINE SULFATE 50 MG/ML IJ SOLN
INTRAMUSCULAR | Status: DC | PRN
  Administered 2020-04-12: 10 mg via INTRAVENOUS

## 2020-04-12 MED ORDER — SODIUM CHLORIDE 0.9 % IV SOLN
INTRAVENOUS | Status: DC
  Administered 2020-04-12: 500 mL via INTRAVENOUS

## 2020-04-12 MED ORDER — LIP MEDEX EX OINT
TOPICAL_OINTMENT | CUTANEOUS | Status: AC
  Filled 2020-04-12: qty 7

## 2020-04-12 MED ORDER — SUGAMMADEX SODIUM 200 MG/2ML IV SOLN
INTRAVENOUS | Status: DC | PRN
  Administered 2020-04-12: 90 mg via INTRAVENOUS

## 2020-04-12 MED ORDER — ROCURONIUM BROMIDE 100 MG/10ML IV SOLN
INTRAVENOUS | Status: DC | PRN
  Administered 2020-04-12: 10 mg via INTRAVENOUS
  Administered 2020-04-12: 20 mg via INTRAVENOUS

## 2020-04-12 MED ORDER — EPHEDRINE SULFATE 50 MG/ML IJ SOLN
INTRAMUSCULAR | Status: DC | PRN
  Administered 2020-04-12: 5 mg via INTRAVENOUS

## 2020-04-12 MED ORDER — FENTANYL CITRATE (PF) 100 MCG/2ML IJ SOLN
INTRAMUSCULAR | Status: DC | PRN
  Administered 2020-04-12: 50 ug via INTRAVENOUS
  Administered 2020-04-12: 25 ug via INTRAVENOUS
  Administered 2020-04-12: 50 ug via INTRAVENOUS

## 2020-04-12 MED ORDER — SODIUM CHLORIDE 0.9 % IV SOLN
INTRAVENOUS | Status: DC | PRN
  Administered 2020-04-12: 30 mL

## 2020-04-12 NOTE — Anesthesia Procedure Notes (Signed)
Procedure Name: Intubation Date/Time: 04/12/2020 12:50 PM Performed by: Garrel Ridgel, CRNA Pre-anesthesia Checklist: Patient identified, Emergency Drugs available, Suction available and Patient being monitored Patient Re-evaluated:Patient Re-evaluated prior to induction Oxygen Delivery Method: Circle system utilized Preoxygenation: Pre-oxygenation with 100% oxygen Induction Type: IV induction Ventilation: Mask ventilation without difficulty Laryngoscope Size: Mac and 3 Grade View: Grade I Tube type: Oral Tube size: 7.0 mm Number of attempts: 1 Airway Equipment and Method: Stylet and Oral airway Placement Confirmation: ETT inserted through vocal cords under direct vision,  positive ETCO2 and breath sounds checked- equal and bilateral Secured at: 21 cm Tube secured with: Tape Dental Injury: Teeth and Oropharynx as per pre-operative assessment

## 2020-04-12 NOTE — Interval H&P Note (Signed)
History and Physical Interval Note:  04/12/2020 12:25 PM  Glenda King  has presented today for surgery, with the diagnosis of pancreatic CA, biliary stricture.  The various methods of treatment have been discussed with the patient and family. After consideration of risks, benefits and other options for treatment, the patient has consented to  Procedure(s) with comments: ENDOSCOPIC RETROGRADE CHOLANGIOPANCREATOGRAPHY (ERCP) (N/A) - with stent exchange as a surgical intervention.  The patient's history has been reviewed, patient examined, no change in status, stable for surgery.  I have reviewed the patient's chart and labs.  Questions were answered to the patient's satisfaction.    Discussed risks and benefits of ERCP.  Discussed risks and benefits of different types of metal stents.   Jackquline Denmark

## 2020-04-12 NOTE — Transfer of Care (Signed)
Immediate Anesthesia Transfer of Care Note  Patient: Glenda King  Procedure(s) Performed: ENDOSCOPIC RETROGRADE CHOLANGIOPANCREATOGRAPHY (ERCP) (N/A )  Patient Location: PACU and Endoscopy Unit  Anesthesia Type:General  Level of Consciousness: awake, alert , oriented and patient cooperative  Airway & Oxygen Therapy: Patient Spontanous Breathing and Patient connected to face mask oxygen  Post-op Assessment: Report given to RN, Post -op Vital signs reviewed and stable and Patient moving all extremities  Post vital signs: Reviewed and stable  Last Vitals:  Vitals Value Taken Time  BP 127/52 04/12/20 1453  Temp    Pulse 82 04/12/20 1458  Resp 20 04/12/20 1458  SpO2 100 % 04/12/20 1458  Vitals shown include unvalidated device data.  Last Pain:  Vitals:   04/12/20 1453  TempSrc: Axillary  PainSc: 0-No pain      Patients Stated Pain Goal: 0 (03/54/65 6812)  Complications: No complications documented.

## 2020-04-12 NOTE — Care Management Important Message (Signed)
Important Message  Patient Details IM Letter given to the Patient Name: Glenda King MRN: [id number] Date of Birth: 1928/12/28   Medicare Important Message Given:  Yes     Kerin Salen 04/12/2020, 10:26 AM

## 2020-04-12 NOTE — Progress Notes (Signed)
Referring Physician(s): Ennever,P/Gerkin,T  Supervising Physician: Aletta Edouard  Patient Status:  [facility] - In-pt  Chief Complaint:  Weakness, abdominal pain/cholecystitis  Subjective: Patient's main complaint is just weakness; denies worsening abdominal pain, nausea, vomiting; appetite minimal; for ERCP today with biliary stent exchange   Allergies: Gadolinium derivatives, Amoxicillin, and Pollen extract  Medications: Prior to Admission medications   Medication Sig Start Date End Date Taking? Authorizing Provider  cholecalciferol (VITAMIN D3) 25 MCG (1000 UNIT) tablet Take 1,000 Units by mouth 2 (two) times daily.   Yes [provider]  levETIRAcetam (KEPPRA) 500 MG tablet Take 500 mg by mouth 2 (two) times daily. 05/13/19  Yes [provider]  levothyroxine (SYNTHROID) 100 MCG tablet Take 100 mcg by mouth daily. 05/13/19  Yes [provider]  Melatonin 10 MG CAPS Take 10 mg by mouth at bedtime.   Yes [provider]  sodium bicarbonate 650 MG tablet Take 650 mg by mouth 2 (two) times daily. 05/27/19  Yes [provider]  vitamin B-12 1000 MCG tablet Take 1 tablet (1,000 mcg total) by mouth daily. 09/06/19  Yes Regalado, Belkys A, MD  lactose free nutrition (BOOST PLUS) LIQD Take 237 mLs by mouth 2 (two) times daily between meals. 04/06/20 05/06/20  Arrien, Jimmy Picket, MD  metoprolol tartrate (LOPRESSOR) 25 MG tablet Take 1 tablet (25 mg total) by mouth 2 (two) times daily. 04/06/20 05/06/20  Arrien, Jimmy Picket, MD  pantoprazole (PROTONIX) 40 MG tablet Take 1 tablet (40 mg total) by mouth daily. 04/07/20 05/07/20  Arrien, Jimmy Picket, MD     Vital Signs: BP (!) 145/81 (BP Location: Left Arm)   Pulse 66   Temp 98 F (36.7 C)   Resp 18   Ht 5' 8.4" (1.737 m)   Wt 97 lb 3.6 oz (44.1 kg)   SpO2 96%   BMI 14.61 kg/m   Physical Exam awake/alert; GB drain intact, insertion site mildly tender to palpation, OP 80 cc,  drain flushed without difficulty  Imaging: IR Perc Cholecystostomy  Result Date: 04/09/2020 INDICATION: 86 year old female with acute calculus cholecystitis. She is not an operative candidate and therefore presents for placement of a percutaneous cholecystostomy tube. EXAM: CHOLECYSTOSTOMY MEDICATIONS: 1 g vancomycin; The antibiotic was administered within an appropriate time frame prior to the initiation of the procedure. ANESTHESIA/SEDATION: Moderate (conscious) sedation was employed during this procedure. A total of Versed 0.5 mg and Fentanyl 25 mcg and 0.5 mg Dilaudid was administered intravenously. Moderate Sedation Time: 11 minutes. The patient's level of consciousness and vital signs were monitored continuously by radiology nursing throughout the procedure under my direct supervision. FLUOROSCOPY TIME:  Fluoroscopy Time: 0 minutes 54 seconds (9 mGy). COMPLICATIONS: None immediate. PROCEDURE: Informed written consent was obtained from the patient after a thorough discussion of the procedural risks, benefits and alternatives. All questions were addressed. Maximal Sterile Barrier Technique was utilized including caps, mask, sterile gowns, sterile gloves, sterile drape, hand hygiene and skin antiseptic. A timeout was performed prior to the initiation of the procedure. The right upper quadrant was interrogated with ultrasound. The gallbladder is distended thick walled contains multiple echogenic foci consistent with stones. A suitable skin entry site was selected and marked. Local anesthesia was attained by infiltration with 1% lidocaine. A small dermatotomy was made. Under real-time ultrasound guidance, a 21 gauge Accustick needle was advanced along a short transhepatic tract and into the gallbladder lumen. A 0.018 wire was then coiled in the gallbladder lumen. The needle was exchanged for  the Accustick sheath dilator which was advanced into the gallbladder lumen. The 0.018 wire was then exchanged for a 75  cm superstiff Amplatz wire. The Accustick sheath was then removed and the skin and transhepatic tract dilated to 10 [place]. A Cook 10.2 [place] all-purpose drainage catheter was then advanced over the wire and formed in the gallbladder lumen. Aspiration yields frankly purulent bile. A sample was sent for Gram stain and culture. A gentle hand injection of contrast material was then performed opacifying the gallbladder lumen. Multiple faceted filling defects are present consistent with cholelithiasis. The tube was gently flushed with saline, connected to gravity bag drainage and secured to the skin with 0 Prolene suture. The patient tolerated the procedure well. IMPRESSION: Successful placement of a transhepatic percutaneous cholecystostomy tube for a diagnosis of acute calculus cholecystitis. PLAN: 1. Maintain drain to gravity bag. 2. Flush at least once per shift. 3. Cultures are pending from purulent bile. Signed, Criselda Peaches, MD, [facility] Radiology Electronically Signed   By: Jacqulynn Cadet M.D.   On: 04/09/2020 09:24    Labs:  CBC: Recent Labs    04/09/20 0739 04/10/20 0409 04/11/20 0556 04/12/20 0406  WBC 12.2* 10.2 10.5 10.0  HGB 11.1* 10.3* 10.6* 10.5*  HCT 35.3* 32.2* 33.9* 33.3*  PLT 216 221 247 238    COAGS: Recent Labs    09/04/19 0753 04/02/20 1308 04/07/20 1154 04/08/20 0330  INR 1.1 1.2 1.2 1.4*  APTT  --  39*  --   --     BMP: Recent Labs    12/10/19 1216 12/10/19 1216 01/19/20 1356 01/19/20 1356 02/27/20 1301 02/27/20 1301 03/12/20 1109 04/02/20 1042 04/07/20 0406 04/08/20 0330 04/09/20 0739 04/12/20 0406  NA 140   < > 140   < > 137   < > 138   < > 134* 135 141 139  K 4.8   < > 5.1   < > 5.1   < > 4.8   < > 4.1 4.3 4.2 4.2  CL 100   < > 105   < > 102   < > 103   < > 107 108 110 114*  CO2 29   < > 28   < > 28   < > 29   < > 18* 18* 19* 19*  GLUCOSE 114*   < > 131*   < > 199*   < > 251*   <  > 99 160* 98 115*  BUN 42*   < > 48*   < > 45*   < > 49*   < > 50* 52* 40* 28*  CALCIUM 10.1   < > 9.7   < > 9.6   < > 9.9   < > 8.4* 8.2* 8.9 8.3*  CREATININE 1.72*   < > 1.56*   < > 1.73*   < > 1.61*   < > 1.61* 1.68* 1.68* 1.42*  GFRNONAA 26*   < > 29*   < > 26*   < > 28*   < > 31* 29* 29* 36*  GFRAA 30*  --  34*  --  30*  --  33*  --   --   --   --   --    < > = values in this interval not displayed.    LIVER FUNCTION TESTS: Recent Labs    04/07/20 0406 04/08/20 0330 04/09/20 0739 04/12/20 0406  BILITOT 1.1 0.8 1.0 0.8  AST 18 17 17 17   ALT 26 24 20 17   ALKPHOS 167* 168* 158* 114  PROT 6.0* 5.7* 6.6 5.9*  ALBUMIN 2.2* 2.0* 2.3* 2.3*    Assessment and Plan: Patient with history of pancreatic carcinoma, bladder cancer with prior cystectomy, CKD, biliary stricture with prior plastic stenting, acute calculus cholecystitis, status post perc GB drain on 10/28; afebrile, WBC normal, hemoglobin stable, creatinine 1.42, total bilirubin normal, bile culture with multiple organisms; for ERCP today with biliary stent exchange; continue gallbladder drain irrigation tid as IP and once daily as OP , output monitoring; drain will need to remain in place at least 4- 6 weeks unless cholecystectomy done in the interim; outpatient follow-up in IR clinic has been ordered   Electronically Signed: D. Rowe Robert, PA-C 04/12/2020, 9:56 AM   I spent a total of 15 minutes at the the patient's bedside AND on the patient's hospital floor or unit, greater than 50% of which was counseling/coordinating care for gallbladder drain    Patient ID: Glenda King, female   DOB: [birth year], 86 y.o.   MRN: [id number]

## 2020-04-12 NOTE — Op Note (Signed)
[facility] Patient Name: Glenda King Procedure Date: 04/12/2020 MRN: [id number] Attending MD: Jackquline Denmark , MD Date of Birth: 03-25-1929 CSN: [id number] Age: 86 Admit Type: Outpatient Procedure:                ERCP Indications:              Pt with HOP AdenoCa Dx 08/2019 s/p ERCP with plastic                            stent placement at that time (plastic stent) for                            CBD stricture                           Admitted now with acute calculus cholecystitis s/p                            cholecystostomy tube placement 04/08/2020.                           For stent exchage. Providers:                Jackquline Denmark, MD, Cleda Daub, RN, Fransico Setters                            Mbumina, Technician Referring MD:              Medicines:                General Anesthesia, patient already on Rocephin.                            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 Estimated Blood Loss:     Estimated blood loss: none. Procedure:                Pre-Anesthesia Assessment:                           - Prior to the procedure, a History and Physical                            was performed, and patient medications and                            allergies were reviewed. The patient's tolerance of                            previous anesthesia was also reviewed. The risks                            and benefits of the procedure and the sedation                            options and risks were discussed with the patient.  All questions were answered, and informed consent                            was obtained. Prior Anticoagulants: The patient has                            taken no previous anticoagulant or antiplatelet                            agents. ASA Grade Assessment: III - A patient with                            severe systemic disease. After reviewing the risks                             and benefits, the patient was deemed in                            satisfactory condition to undergo the procedure.                           After obtaining informed consent, the scope was                            passed under direct vision. Throughout the                            procedure, the patient's blood pressure, pulse, and                            oxygen saturations were monitored continuously. The                            ERCP [id number] was introduced through the mouth, and                            used to inject contrast into and used to inject                            contrast into the bile duct. The ERCP was unusually                            difficult due to abnormal anatomy. The patient                            tolerated the procedure well. Scope In: Scope Out: Findings:      Difficult exam.      The ERCP side-viewing scope could not be passed to the second portion       d/t stricture and J- shaped stomach. The stricture was dilated using 10       mm, 11 mm and then 12 mm wire-guided CRE under fluoroscopy. The scope       still could not be passed beyond. We switched over to EGD  scope and       dilated the stricture with 12 mm TTS balloon x 1 minute. This did result       in passage of ERCP scope to the second portion of the duodenum.      One previous plastic stent originating from the papilla was visualized.       It had partially migrated into the duodenum. Retrieval by snare was       unsuccessful. The stent was retrieved by Rat-toothed forceps. The stent       was not assessed for patency. There was previous sphincterotomy.      The bile duct was deeply cannulated with the short-nosed traction       sphincterotome. Contrast was injected. I personally interpreted the bile       duct images. Ductal flow of contrast was adequate. Image quality was       adequate. Contrast extended to the entire biliary tree. The main bile       duct  was diffusely dilated, with a distal CBD stricture measuring 1 cm.       To discover objects, the biliary tree was swept with a 10 mm balloon       starting at the bifurcation. Nothing was found. One 10 mm by 6 cm       uncovered metal stent was placed 5 cm into the common bile duct. Bile       flowed through the stent. The stent was in good position. Postprocedure       endoscopic and radiologic photodocumentation was obtained.      Pancreatic duct was intentionally not cannulated. 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                           -Acquired duodenal stenosis s/p duodenal                            dilatation. No outlet obstruction.                           -J shaped stomach. Moderate Sedation:      Not Applicable - Patient had care per Anesthesia. Recommendation:           - Return patient to hospital ward for ongoing care.                           - Watch for pancreatitis, bleeding, perforation,                            and cholangitis.                           - Clear liquid diet today at 6 PM.                           -  D/W family. Procedure Code(s):        --- Professional ---                           [id number], Endoscopic retrograde                            cholangiopancreatography (ERCP); with removal and                            exchange of stent(s), biliary or pancreatic duct,                            including pre- and post-dilation and guide wire                            passage, when performed, including sphincterotomy,                            when performed, each stent exchanged                           [id number], Endoscopic catheterization of the biliary                            ductal system, radiological supervision and                             interpretation Diagnosis Code(s):        --- Professional ---                           Z96.89, Presence of other specified functional                            implants                           K31.5, Obstruction of duodenum                           K83.1, Obstruction of bile duct CPT copyright 2019 [employer]. All rights reserved. The codes documented in this report are preliminary and upon coder review may  be revised to meet current compliance requirements. Jackquline Denmark, MD 04/12/2020 3:02:55 PM This report has been signed electronically. Number of Addenda: 0

## 2020-04-12 NOTE — Progress Notes (Signed)
POST ERCP NOTE:   Pt doing well. No abdominal pain or nausea or melena. Vitals stable. Abdominal Exam: soft, nontender.  Advance diet to full liquid diet (can have cereal) Anticipate D/C in AM. Discussed with pt and Lelon Frohlich (family)    Carmell Austria MD

## 2020-04-12 NOTE — Progress Notes (Signed)
Nutrition Follow-up  DOCUMENTATION CODES:   Severe malnutrition in context of chronic illness, Underweight  INTERVENTION:  - re-advance diet as medically feasible.  - continue Boost Plus BID.  NUTRITION DIAGNOSIS:   Severe Malnutrition related to chronic illness, cancer and cancer related treatments as evidenced by moderate fat depletion, moderate muscle depletion, severe fat depletion, severe muscle depletion. -ongoing  GOAL:   Patient will meet greater than or equal to 90% of their needs -minimally met on average   MONITOR:   PO intake, Supplement acceptance, Labs, Weight trends  ASSESSMENT:   86 year old female with medical history of pancreatic cancer, seizures, HTN, and hypothyroidism. She presented to the ED with 3 day hx of fatigue, generalized weakness, lightheadedness, dizziness, and intermittent palpitations.  Patient has been NPO since midnight in anticipation of ERCP today. She was previously on a Regular diet and on 10/30 ate 50% of breakfast, 0% of lunch, and 50% of dinner; on 10/31 she ate 50% of breakfast, 25% of lunch, and 50% of dinner. She has been accepting Boost Plus nearly 100% of the time offered.    Weight on admission date of 10/22 was 98 lb and weight today is 97 lb. No information documented in the edema section of flow sheet.   Per MD note this AM, plan for time of d/c is for SNF and this may possibly occur tomorrow (11/2). Patient with acute cholecystitis and on IV abx for the same.    Labs reviewed; Cl: 114 mmol/l, BUN: 28 mg/dl, creatinine: 1.42 mg/dl, Ca: 8.3 mg/dl, GFR: 36 ml/min.  Medications reviewed; 100 mcg oral synthroid/day, 10 mg melatonin/day, 40 mg oral protonix/day, 17 g miralax/day, 650 mg sodium bicarbonate BID.     Diet Order:   Diet Order            Diet NPO time specified  Diet effective midnight           Diet - low sodium heart healthy                 EDUCATION NEEDS:   No education needs have been identified at  this time  Skin:  Skin Assessment: Reviewed RN Assessment  Last BM:  10/31  Height:   Ht Readings from Last 1 Encounters:  04/02/20 5' 8.4" (1.737 m)    Weight:   Wt Readings from Last 1 Encounters:  04/12/20 44.1 kg    Estimated Nutritional Needs:  Kcal:  4967-5916 kcal Protein:  65-75 grams Fluid:  >/= 1.7 L/day     Jarome Matin, MS, RD, LDN, CNSC Inpatient Clinical Dietitian RD pager # available in [facility]  After hours/weekend pager # available in [facility]

## 2020-04-12 NOTE — Plan of Care (Signed)

## 2020-04-12 NOTE — Progress Notes (Signed)
OT Cancellation Note  Patient Details Name: Glenda King MRN: [id number] DOB: 13-Aug-1928   Cancelled Treatment:    Reason Eval/Treat Not Completed: Patient at procedure or test/ unavailable.   Pt in process of leaving room with nursing for procedure. Will continue efforts.   Julien Girt 04/12/2020, 11:54 AM

## 2020-04-12 NOTE — Anesthesia Postprocedure Evaluation (Signed)
Anesthesia Post Note  Patient: Glenda King  Procedure(s) Performed: ENDOSCOPIC RETROGRADE CHOLANGIOPANCREATOGRAPHY (ERCP) (N/A ) STENT REMOVAL BILIARY STENT PLACEMENT (N/A ) BALLOON DILATION (N/A )     Patient location during evaluation: PACU Anesthesia Type: General Level of consciousness: awake and alert, oriented and patient cooperative Pain management: pain level controlled Vital Signs Assessment: post-procedure vital signs reviewed and stable Respiratory status: spontaneous breathing, nonlabored ventilation and respiratory function stable Cardiovascular status: blood pressure returned to baseline and stable Postop Assessment: no apparent nausea or vomiting Anesthetic complications: no   No complications documented.  Last Vitals:  Vitals:   04/12/20 1520 04/12/20 1544  BP: (!) 125/57 127/69  Pulse: 78 73  Resp: 20   Temp:  36.5 C  SpO2: 94% 94%    Last Pain:  Vitals:   04/12/20 1544  TempSrc: Oral  PainSc:                  Pervis Hocking

## 2020-04-12 NOTE — Progress Notes (Signed)
PROGRESS NOTE    Glenda King  OQH:[id number] DOB: 1928-11-21 DOA: 04/02/2020 PCP: Javier Glazier, MD    Brief Narrative:  86 year old female with history of localized pancreatic cancer status post radiosurgery, seizures, hypertension and hypothyroidism.  Admitted to the hospital with 3 days of fatigue, generalized weakness, lightheadedness and dizziness.  On the day of admission she was seen at oncologist office, found to have A. fib with RVR with heart rate more than 130 and referred to the emergency room.  Patient was treated with diltiazem infusion and IV heparin and admitted to the hospital.  10/26, patient was seen by physical therapy and she was discharged to a skilled nursing facility.  She was about to leave the hospital.  A repeat surveillance MRI of the abdomen was done that showed acute cholecystitis, hence discharge was canceled and surgery was consulted.  She did have persistent leukocytosis. 10/28, percutaneous cholecystostomy tube placed and clinically stabilizing. Clinically stabilizing.   11/1, for ERCP and biliary stent exchange today.   Assessment & Plan:   Principal Problem:   New onset a-fib [facility]) Active Problems:   Malignant neoplasm of bladder, unspecified (HCC)   Hypothyroidism   Chronic kidney disease (CKD) stage G4/A1, severely decreased glomerular filtration rate (GFR) between 15-29 mL/min/1.73 square meter and albuminuria creatinine ratio less than 30 mg/g (HCC)   History of seizure   Anemia in chronic renal disease   History of urinary diversion procedure   Bile duct stricture   Primary pancreatic cancer (HCC)   Acute lower UTI   Elevated troponin   Protein-calorie malnutrition, severe  New onset A. fib: Currently sinus rhythm.  Rate well controlled on metoprolol.  Echocardiogram was normal.  Initially started on anticoagulation, however per oncology's recommendation, high risk of bleeding so this was discontinued.  Currently remains stable  in sinus rhythm.  Acute calculus cholecystitis: With persistent leukocytosis and history of localized pancreatic cancer, patient underwent MRI of the abdomen that was positive for acute calculus cholecystitis along with intra and extrahepatic biliary dilatation.  LFTs are normal. Percutaneous cholecystostomy tube placement 10/28 by IR. Bile cultures with gram-positive and gram-negative cocci, on Rocephin and Flagyl.  WBC count improved.  Received a dose of vancomycin perioperative.  We will continue on current antibiotics. Plan for ERCP and biliary stent exchange today. Continue Rocephin and Flagyl.  Will change to Keflex and Flagyl for total 2 weeks on discharge.  UTI: Suspected present on admission.  Blood cultures and urine cultures no growth.  Treated with Rocephin, now continued to cover for intra-abdominal infection.  CKD stage IV: Her creatinine at about baseline.  We will continue to monitor.  Hypokalemia/hypomagnesemia: Replaced and adequate.  Pancreatic cancer: Status post radiation.  Followed by Dr. Marin Olp.  Also has anemia of chronic disease. Received 2 units of PRBC while in the hospital. Received 1 unit of IV iron.  Seizure disorder: Currently well controlled on Keppra.  Severe protein calorie malnutrition: Nutrition Status: Nutrition Problem: Severe Malnutrition Etiology: chronic illness, cancer and cancer related treatments Signs/Symptoms: moderate fat depletion, moderate muscle depletion, severe fat depletion, severe muscle depletion Interventions: Boost Plus  Continue mobilize.  Work with PT OT. Anticipate discharge tomorrow to a skilled nursing home after procedure today.   DVT prophylaxis: SCDs   Code Status: DNR Family Communication: Patient's brother at bedside. Disposition Plan: Status is: Inpatient  Remains inpatient appropriate because:IV treatments appropriate due to intensity of illness or inability to take PO and Inpatient level of care appropriate  due to severity of illness   Dispo: The patient is from: Home, independent living              Anticipated d/c is to: SNF.              Anticipated d/c date is: Architectural technologist.              Patient currently is not medically stable to d/c.  Plan for inpatient procedure before discharge.  Also needing IV antibiotics for acute cholecystitis.         Consultants:   Gastroenterology  General surgery  Oncology  Interventional radiology  Procedures:   None  Antimicrobials:  Anti-infectives (From admission, onward)   Start     Dose/Rate Route Frequency Ordered Stop   04/08/20 1645  vancomycin (VANCOCIN) IVPB 1000 mg/200 mL premix        1,000 mg 200 mL/hr over 60 Minutes Intravenous  Once 04/08/20 1627 04/08/20 1858   04/06/20 2200  sulfamethoxazole-trimethoprim (BACTRIM DS) 800-160 MG per tablet 1 tablet  Status:  Discontinued        1 tablet Oral Every 12 hours 04/06/20 1728 04/06/20 1729   04/06/20 2200  cephALEXin (KEFLEX) capsule 250 mg  Status:  Discontinued        250 mg Oral Every 12 hours 04/06/20 1729 04/06/20 1741   04/06/20 1830  cefTRIAXone (ROCEPHIN) 2 g in sodium chloride 0.9 % 100 mL IVPB        2 g 200 mL/hr over 30 Minutes Intravenous Every 24 hours 04/06/20 1741     04/06/20 1830  metroNIDAZOLE (FLAGYL) IVPB 500 mg        500 mg 100 mL/hr over 60 Minutes Intravenous Every 8 hours 04/06/20 1741     04/06/20 0000  sulfamethoxazole-trimethoprim (BACTRIM DS) 800-160 MG tablet  Status:  Discontinued        1 tablet Oral Every 12 hours 04/06/20 1728 04/06/20    04/06/20 0000  cephALEXin (KEFLEX) 250 MG capsule        250 mg Oral Every 12 hours 04/06/20 1730 04/11/20 2359   04/02/20 2200  cefTRIAXone (ROCEPHIN) 1 g in sodium chloride 0.9 % 100 mL IVPB        1 g 200 mL/hr over 30 Minutes Intravenous Every 24 hours 04/02/20 2016 04/04/20 2154   04/02/20 1415  aztreonam (AZACTAM) 1 g in sodium chloride 0.9 % 100 mL IVPB        1 g 200 mL/hr over 30 Minutes  Intravenous  Once 04/02/20 1413 04/02/20 1514         Subjective: Seen and examined.  No overnight events.  Looking forward for ERCP. very hungry and thirsty.  Objective: Vitals:   04/11/20 1347 04/11/20 2154 04/12/20 0600 04/12/20 0623  BP: (!) 139/91 (!) 141/85  (!) 145/81  Pulse: 68 (!) 57  66  Resp: 18 18  18   Temp: 97.6 F (36.4 C) 97.9 F (36.6 C)  98 F (36.7 C)  TempSrc: Oral Oral    SpO2: 99% 96%  96%  Weight:   44.1 kg   Height:        Intake/Output Summary (Last 24 hours) at 04/12/2020 1048 Last data filed at 04/12/2020 0800 Gross per 24 hour  Intake 1412 ml  Output 2081 ml  Net -669 ml   Filed Weights   04/02/20 2012 04/12/20 0600  Weight: 44.4 kg 44.1 kg    Examination:  Physical Exam Constitutional:  Appearance: Normal appearance.     Comments: Not in any distress.  She is cachectic.  HENT:     Head: Atraumatic.  Cardiovascular:     Rate and Rhythm: Regular rhythm.  Pulmonary:     Breath sounds: Normal breath sounds.  Abdominal:     Comments: Soft.  Slightly tenderness on the right upper quadrant.  Biliary tube draining blood-tinged bile.       Data Reviewed: I have personally reviewed following labs and imaging studies  CBC: Recent Labs  Lab 04/07/20 0406 04/07/20 0406 04/08/20 0330 04/09/20 0739 04/10/20 0409 04/11/20 0556 04/12/20 0406  WBC 19.8*   < > 17.2* 12.2* 10.2 10.5 10.0  NEUTROABS 18.2*  --   --  10.6* 8.5* 8.7* 8.2*  HGB 10.4*   < > 10.0* 11.1* 10.3* 10.6* 10.5*  HCT 32.0*   < > 30.6* 35.3* 32.2* 33.9* 33.3*  MCV 98.8   < > 99.4 101.1* 100.6* 101.2* 101.8*  PLT 159   < > 173 216 221 247 238   < > = values in this interval not displayed.   Basic Metabolic Panel: Recent Labs  Lab 04/06/20 0427 04/07/20 0406 04/08/20 0330 04/09/20 0739 04/12/20 0406  NA 136 134* 135 141 139  K 3.3* 4.1 4.3 4.2 4.2  CL 105 107 108 110 114*  CO2 20* 18* 18* 19* 19*  GLUCOSE 110* 99 160* 98 115*  BUN 53* 50* 52* 40* 28*    CREATININE 1.73* 1.61* 1.68* 1.68* 1.42*  CALCIUM 8.2* 8.4* 8.2* 8.9 8.3*  MG  --   --  2.0  --   --   PHOS  --   --  3.7  --   --    GFR: Estimated Creatinine Clearance: 19.1 mL/min (A) (by C-G formula based on SCr of 1.42 mg/dL (H)). Liver Function Tests: Recent Labs  Lab 04/07/20 0406 04/08/20 0330 04/09/20 0739 04/12/20 0406  AST 18 17 17 17   ALT 26 24 20 17   ALKPHOS 167* 168* 158* 114  BILITOT 1.1 0.8 1.0 0.8  PROT 6.0* 5.7* 6.6 5.9*  ALBUMIN 2.2* 2.0* 2.3* 2.3*   No results for input(s): LIPASE, AMYLASE in the last 168 hours. No results for input(s): AMMONIA in the last 168 hours. Coagulation Profile: Recent Labs  Lab 04/07/20 1154 04/08/20 0330  INR 1.2 1.4*   Cardiac Enzymes: No results for input(s): CKTOTAL, CKMB, CKMBINDEX, TROPONINI in the last 168 hours. BNP (last 3 results) No results for input(s): PROBNP in the last 8760 hours. HbA1C: No results for input(s): HGBA1C in the last 72 hours. CBG: Recent Labs  Lab 04/06/20 1524  GLUCAP 106*   Lipid Profile: No results for input(s): CHOL, HDL, LDLCALC, TRIG, CHOLHDL, LDLDIRECT in the last 72 hours. Thyroid Function Tests: No results for input(s): TSH, T4TOTAL, FREET4, T3FREE, THYROIDAB in the last 72 hours. Anemia Panel: No results for input(s): VITAMINB12, FOLATE, FERRITIN, TIBC, IRON, RETICCTPCT in the last 72 hours. Sepsis Labs: No results for input(s): PROCALCITON, LATICACIDVEN in the last 168 hours.  Recent Results (from the past 240 hour(s))  Culture, blood (single)     Status: None   Collection Time: 04/02/20  1:05 PM   Specimen: BLOOD LEFT ARM  Result Value Ref Range Status   Specimen Description   Final    BLOOD LEFT ARM Performed at [facility], [address], [place], [place] [address]    Special Requests   Final    BOTTLES DRAWN AEROBIC  AND ANAEROBIC Blood Culture adequate volume Performed at [facility], [address] [place], [place] [address]     Culture   Final    NO GROWTH 5 DAYS Performed at [facility], [place] [address] [place], [place] [address]    Report Status 04/07/2020 FINAL  Final  Urine culture     Status: Abnormal   Collection Time: 04/02/20  1:08 PM   Specimen: Urine, Random  Result Value Ref Range Status   Specimen Description   Final    URINE, RANDOM Performed at [facility], [address] [place], [place] [address]    Special Requests   Final    NONE Performed at [facility], [address] [place], [place] [address]    Culture MULTIPLE SPECIES PRESENT, SUGGEST RECOLLECTION (A)  Final   Report Status 04/03/2020 FINAL  Final  Resp Panel by RT PCR (RSV, Flu A&B, Covid) - Nasopharyngeal Swab     Status: None   Collection Time: 04/02/20  1:08 PM   Specimen: Nasopharyngeal Swab  Result Value Ref Range Status   SARS Coronavirus 2 by RT PCR NEGATIVE NEGATIVE Final    Comment: (NOTE) SARS-CoV-2 target nucleic acids are NOT DETECTED.  The SARS-CoV-2 RNA is generally detectable in upper respiratoy specimens during the acute phase of infection. The lowest concentration of SARS-CoV-2 viral copies this assay can detect is 131 copies/mL. A negative result does not preclude SARS-Cov-2 infection and should not be used as the sole basis for treatment or other patient management decisions. A negative result may occur with  improper specimen collection/handling, submission of specimen other than nasopharyngeal swab, presence of viral mutation(s) within the areas targeted by this assay, and inadequate number of viral copies (<131 copies/mL). A negative result must be combined with clinical observations, patient history, and epidemiological information. The expected result is Negative.  Fact Sheet for Patients:  PinkCheek.be  Fact Sheet for Healthcare Providers:  GravelBags.it  This test is no t yet approved or cleared by the  [place] FDA and  has been authorized for detection and/or diagnosis of SARS-CoV-2 by FDA under an Emergency Use Authorization (EUA). This EUA will remain  in effect (meaning this test can be used) for the duration of the COVID-19 declaration under Section 564(b)(1) of the Act, 21 U.S.C. section 360bbb-3(b)(1), unless the authorization is terminated or revoked sooner.     Influenza A by PCR NEGATIVE NEGATIVE Final   Influenza B by PCR NEGATIVE NEGATIVE Final    Comment: (NOTE) The Xpert Xpress SARS-CoV-2/FLU/RSV assay is intended as an aid in  the diagnosis of influenza from Nasopharyngeal swab specimens and  should not be used as a sole basis for treatment. Nasal washings and  aspirates are unacceptable for Xpert Xpress SARS-CoV-2/FLU/RSV  testing.  Fact Sheet for Patients: PinkCheek.be  Fact Sheet for Healthcare Providers: GravelBags.it  This test is not yet approved or cleared by the [place] FDA and  has been authorized for detection and/or diagnosis of SARS-CoV-2 by  FDA under an Emergency Use Authorization (EUA). This EUA will remain  in effect (meaning this test can be used) for the duration of the  Covid-19 declaration under Section 564(b)(1) of the Act, 21  U.S.C. section 360bbb-3(b)(1), unless the authorization is  terminated or revoked.    Respiratory Syncytial Virus by PCR NEGATIVE NEGATIVE Final    Comment: (NOTE) Fact Sheet for Patients: PinkCheek.be  Fact Sheet for Healthcare Providers:  GravelBags.it  This test is not yet approved or cleared by the [place] and  has been authorized for detection and/or diagnosis of SARS-CoV-2 by  FDA under an Emergency Use Authorization (EUA). This EUA will remain  in effect (meaning this test can be used) for the duration of the  COVID-19 declaration under Section 564(b)(1) of the Act, 21  U.S.C.  section 360bbb-3(b)(1), unless the authorization is terminated or  revoked. Performed at [facility], [address] [place], [place] [address]   Culture, blood (x 2)     Status: None   Collection Time: 04/02/20 10:05 PM   Specimen: BLOOD  Result Value Ref Range Status   Specimen Description   Final    BLOOD LEFT HAND Performed at [place] [address] [place], [place] [address]    Special Requests   Final    BOTTLES DRAWN AEROBIC AND ANAEROBIC Blood Culture adequate volume Performed at [place] [address], [place], [place] [address]    Culture   Final    NO GROWTH 5 DAYS Performed at [facility], [place] [address] [place], [place] [address]    Report Status 04/08/2020 FINAL  Final  Culture, blood (x 2)     Status: None   Collection Time: 04/02/20 10:05 PM   Specimen: BLOOD  Result Value Ref Range Status   Specimen Description   Final    BLOOD RIGHT HAND Performed at [place] [address] [place], [place] [address]    Special Requests   Final    BOTTLES DRAWN AEROBIC AND ANAEROBIC Blood Culture adequate volume Performed at [place] [address] [place], [place] [address]    Culture   Final    NO GROWTH 5 DAYS Performed at [facility], [place] [address], [place],  [address]    Report Status 04/08/2020 FINAL  Final  SARS Coronavirus 2 by RT PCR (hospital order, performed in [facility] hospital lab) Nasopharyngeal Nasopharyngeal Swab     Status: None   Collection Time: 04/06/20  2:46 PM   Specimen: Nasopharyngeal Swab  Result Value Ref Range Status   SARS Coronavirus 2 NEGATIVE NEGATIVE Final    Comment: (NOTE) SARS-CoV-2 target nucleic acids are NOT DETECTED.  The SARS-CoV-2 RNA is generally detectable in upper and lower respiratory specimens during the acute phase of infection. The lowest concentration of SARS-CoV-2 viral  copies this assay can detect is 250 copies / mL. A negative result does not preclude SARS-CoV-2 infection and should not be used as the sole basis for treatment or other patient management decisions.  A negative result may occur with improper specimen collection / handling, submission of specimen other than nasopharyngeal swab, presence of viral mutation(s) within the areas targeted by this assay, and inadequate number of viral copies (<250 copies / mL). A negative result must be combined with clinical observations, patient history, and epidemiological information.  Fact Sheet for Patients:   StrictlyIdeas.no  Fact Sheet for Healthcare Providers: BankingDealers.co.za  This test is not yet approved or  cleared by the [place] FDA and has been authorized for detection and/or diagnosis of SARS-CoV-2 by FDA under an Emergency Use Authorization (EUA).  This EUA will remain in effect (meaning this test can be used) for the duration of the COVID-19 declaration under Section 564(b)(1) of the Act, 21 U.S.C. section 360bbb-3(b)(1), unless the authorization is terminated or revoked sooner.  Performed at [facility], [place] [address] [place], [place] [address]   Aerobic/Anaerobic Culture (surgical/deep wound)     Status: Abnormal (Preliminary result)   Collection Time: 04/08/20  5:00 PM   Specimen: BILE; Abscess  Result Value Ref Range Status   Specimen Description   Final    BILE ABSCESS Performed at [place] [address], [place], [place] [address]    Special Requests   Final    NONE Performed at [facility], [place] [address] [place], [place] [address]    Gram Stain   Final    ABUNDANT WBC PRESENT, PREDOMINANTLY PMN ABUNDANT GRAM POSITIVE COCCI RARE GRAM NEGATIVE RODS Performed at [facility], [place] [address] [place], [place] [address]    Culture (A)  Final     MULTIPLE ORGANISMS PRESENT, NONE PREDOMINANT NO ANAEROBES ISOLATED; CULTURE IN PROGRESS FOR 5 DAYS    Report Status PENDING  Incomplete         Radiology Studies: No results found.      Scheduled Meds: . lactose free nutrition  237 mL Oral BID BM  . levETIRAcetam  500 mg Oral BID  . levothyroxine  100 mcg Oral Q0600  . melatonin  10 mg Oral QHS  . metoprolol tartrate  25 mg Oral BID  . pantoprazole  40 mg Oral Daily  . polyethylene glycol  17 g Oral Daily  . sodium bicarbonate  650 mg Oral BID  . sodium chloride flush  5 mL Intracatheter Q8H   Continuous Infusions: . cefTRIAXone (ROCEPHIN)  IV 2 g (04/11/20 1711)  . metronidazole Stopped (04/12/20 0346)     LOS: 10 days    Time spent: 30 minutes    Barb Merino, MD [facility] Pager [phone]

## 2020-04-12 NOTE — Anesthesia Preprocedure Evaluation (Addendum)
Anesthesia Evaluation  Patient identified by MRN, date of birth, ID band Patient awake    Reviewed: Allergy & Precautions, NPO status , Patient's Chart, lab work & pertinent test results  Airway Mallampati: I  TM Distance: >3 FB Neck ROM: Full    Dental no notable dental hx. (+) Teeth Intact, Dental Advisory Given   Pulmonary neg pulmonary ROS,    Pulmonary exam normal breath sounds clear to auscultation       Cardiovascular +CHF  Normal cardiovascular exam Rhythm:Regular Rate:Normal  ECG: SR, PAC's rate 96  ECHO: 1. Left ventricular ejection fraction, by estimation, is 60 to 65%. The left ventricle has normal function. The left ventricle has no regional wall motion abnormalities. Left ventricular diastolic parameters were normal. 2. Right ventricular systolic function is normal. The right ventricular size is normal. There is normal pulmonary artery systolic pressure. The estimated right ventricular systolic pressure is 27.5 mmHg. 3. A small pericardial effusion is present. The pericardial effusion is posterior to the left ventricle. There is no evidence of cardiac tamponade. 4. The mitral valve is grossly normal. Trivial mitral valve regurgitation. No evidence of mitral stenosis. 5. The aortic valve is tricuspid. There is mild thickening of the aortic valve. Aortic valve regurgitation is not visualized. No aortic stenosis is present. 6. The inferior vena cava is normal in size with greater than 50% respiratory variability, suggesting right atrial pressure of 3 mmHg.   Neuro/Psych negative neurological ROS     GI/Hepatic negative GI ROS, Neg liver ROS, Pancreatic ca   Endo/Other  Hypothyroidism   Renal/GU Renal InsufficiencyRenal diseaseCr 1.4  negative genitourinary   Musculoskeletal negative musculoskeletal ROS (+)   Abdominal   Peds  Hematology  (+) anemia ,   Anesthesia Other Findings pancreatic CA   biliary stricture  Reproductive/Obstetrics negative OB ROS                           Anesthesia Physical Anesthesia Plan  ASA: III  Anesthesia Plan: General   Post-op Pain Management:    Induction: Intravenous  PONV Risk Score and Plan: 4 or greater and Ondansetron, Dexamethasone, Propofol infusion and Treatment may vary due to age or medical condition  Airway Management Planned: Oral ETT  Additional Equipment:   Intra-op Plan:   Post-operative Plan: Extubation in OR  Informed Consent: I have reviewed the patients History and Physical, chart, labs and discussed the procedure including the risks, benefits and alternatives for the proposed anesthesia with the patient or authorized representative who has indicated his/her understanding and acceptance.   Patient has DNR.  Suspend DNR.   Dental advisory given  Plan Discussed with: CRNA  Anesthesia Plan Comments:        Anesthesia Quick Evaluation

## 2020-04-12 NOTE — Progress Notes (Signed)
Physical Therapy Treatment Patient Details Name: Glenda King MRN: [id number] DOB: 1928-11-24 Today's Date: 04/12/2020    History of Present Illness 86 y.o. female with medical history significant of  Pancreatic Ca seizure episode, HTN, hypothyroidism, sp cystectomy now self catheterizes 3-4 times a day. Pt admitted with fatigue, dizziness, a fall. Dx of new afib, sepsis, UTI.    PT Comments    Pt continues to participate well. She c/o feeling weak on today. She was pleased to be up and moving around. Pt reports she is scheduled for a possible procedure on today. Will continue to follow and progress activity as tolerated.     Follow Up Recommendations  Home health PT     Equipment Recommendations  Rolling walker with 5" wheels (possibly)    Recommendations for Other Services       Precautions / Restrictions Precautions Precautions: Fall Precaution Comments: R abd drain Restrictions Weight Bearing Restrictions: No    Mobility  Bed Mobility Overal bed mobility: Modified Independent                Transfers Overall transfer level: Needs assistance Equipment used: Rolling walker (2 wheeled) Transfers: Sit to/from Stand Sit to Stand: Min guard         General transfer comment: Min guard for safety. Pt requested RW for support due to feeling weak.  Ambulation/Gait Ambulation/Gait assistance: Min guard Gait Distance (Feet): 350 Feet Assistive device: Rolling walker (2 wheeled) Gait Pattern/deviations: Step-through pattern;Decreased stride length     General Gait Details: used RW for safety/support-pt feels weak on today. Min guard for safety. Pt tolerated distance well.   Stairs             Wheelchair Mobility    Modified Rankin (Stroke Patients Only)       Balance Overall balance assessment: Needs assistance         Standing balance support: Bilateral upper extremity supported Standing balance-Leahy Scale: Fair                               Cognition Arousal/Alertness: Awake/alert Behavior During Therapy: WFL for tasks assessed/performed Overall Cognitive Status: Within Functional Limits for tasks assessed                                        Exercises      General Comments        Pertinent Vitals/Pain Pain Assessment: No/denies pain    Home Living                      Prior Function            PT Goals (current goals can now be found in the care plan section) Progress towards PT goals: Progressing toward goals    Frequency           PT Plan Current plan remains appropriate    Co-evaluation              AM-PAC PT "6 Clicks" Mobility   Outcome Measure  Help needed turning from your back to your side while in a flat bed without using bedrails?: None Help needed moving from lying on your back to sitting on the side of a flat bed without using bedrails?: None Help needed moving to and from a bed to a chair (  including a wheelchair)?: A Little Help needed standing up from a chair using your arms (e.g., wheelchair or bedside chair)?: None Help needed to walk in hospital room?: A Little Help needed climbing 3-5 steps with a railing? : A Little 6 Click Score: 21    End of Session   Activity Tolerance: Patient tolerated treatment well Patient left: in bed;with call bell/phone within reach;with bed alarm set   PT Visit Diagnosis: Difficulty in walking, not elsewhere classified (R26.2)     Time: 1100-1115 PT Time Calculation (min) (ACUTE ONLY): 15 min  Charges:  $Gait Training: 8-22 mins                        Doreatha Massed, PT Acute Rehabilitation  Office: [phone] Pager: [phone]

## 2020-04-13 ENCOUNTER — Encounter: Payer: Self-pay | Admitting: *Deleted

## 2020-04-13 DIAGNOSIS — I4891 Unspecified atrial fibrillation: Secondary | ICD-10-CM | POA: Diagnosis not present

## 2020-04-13 LAB — CBC WITH DIFFERENTIAL/PLATELET
Abs Immature Granulocytes: 0.22 10*3/uL — ABNORMAL HIGH (ref 0.00–0.07)
Basophils Absolute: 0 10*3/uL (ref 0.0–0.1)
Basophils Relative: 0 %
Eosinophils Absolute: 0.1 10*3/uL (ref 0.0–0.5)
Eosinophils Relative: 1 %
HCT: 32 % — ABNORMAL LOW (ref 36.0–46.0)
Hemoglobin: 10.1 g/dL — ABNORMAL LOW (ref 12.0–15.0)
Immature Granulocytes: 2 %
Lymphocytes Relative: 5 %
Lymphs Abs: 0.4 10*3/uL — ABNORMAL LOW (ref 0.7–4.0)
MCH: 32.1 pg (ref 26.0–34.0)
MCHC: 31.6 g/dL (ref 30.0–36.0)
MCV: 101.6 fL — ABNORMAL HIGH (ref 80.0–100.0)
Monocytes Absolute: 0.5 10*3/uL (ref 0.1–1.0)
Monocytes Relative: 5 %
Neutro Abs: 8.2 10*3/uL — ABNORMAL HIGH (ref 1.7–7.7)
Neutrophils Relative %: 87 %
Platelets: 225 10*3/uL (ref 150–400)
RBC: 3.15 MIL/uL — ABNORMAL LOW (ref 3.87–5.11)
RDW: 13.8 % (ref 11.5–15.5)
WBC: 9.5 10*3/uL (ref 4.0–10.5)
nRBC: 0 % (ref 0.0–0.2)

## 2020-04-13 LAB — AEROBIC/ANAEROBIC CULTURE W GRAM STAIN (SURGICAL/DEEP WOUND)

## 2020-04-13 MED ORDER — CEPHALEXIN 250 MG PO CAPS: 250.0000 mg | ORAL_CAPSULE | Freq: Two times a day (BID) | ORAL | 0 refills | Status: AC

## 2020-04-13 MED ORDER — METRONIDAZOLE 500 MG PO TABS: 500.0000 mg | ORAL_TABLET | Freq: Three times a day (TID) | ORAL | 0 refills | Status: AC

## 2020-04-13 NOTE — Plan of Care (Signed)
  Problem: Education: Goal: Knowledge of General Education information will improve Description: Including pain rating scale, medication(s)/side effects and non-pharmacologic comfort measures Outcome: Progressing   Problem: Clinical Measurements: Goal: Will remain free from infection Outcome: Progressing   

## 2020-04-13 NOTE — TOC Progression Note (Signed)
Transition of Care [facility]) - Progression Note    Patient Details  Name: Glenda King MRN: [id number] Date of Birth: 04-Sep-1928  [facility] CM/SW Contact  Purcell Mouton, RN Phone Number: 04/13/2020, 10:48 AM  Clinical Narrative:    Glenda King was called for transportation. RN and pt is aware.    Expected Discharge Plan: Assisted Living Barriers to Discharge: No Barriers Identified  Expected Discharge Plan and Services Expected Discharge Plan: Assisted Living       Living arrangements for the past 2 months: [place] Expected Discharge Date: 04/13/20                                     Social Determinants of Health (SDOH) Interventions    Readmission Risk Interventions No flowsheet data found.

## 2020-04-13 NOTE — Progress Notes (Signed)
Report called to [facility] to Vaughan Basta, RN, reviewed discharge plan of care, acknowledged understanding. EMS has arrived to transport pt to facility. SRP, RN

## 2020-04-13 NOTE — Discharge Summary (Signed)
Physician Discharge Summary  Glenda King IOX:[id number] DOB: 07/21/1928 DOA: 04/02/2020  PCP: Javier Glazier, MD  Admit date: 04/02/2020 Discharge date: 04/13/2020  Admitted From: Home/ ALF   Disposition:  SNF   Recommendations for Outpatient Follow-up and new medication changes:  1. Patient will have cholecystostomy tube in place for 4 to 6 weeks, interventional radiology will schedule follow-up. 2. Metoprolol has been increased to 25 mg po bid 3. Holding on anticoagulation due to risk of bleeding 4. Patient had Iv iron x2, holding now on oral iron supplementation.  5. Surgical follow-up as scheduled.   Home Health: no   Equipment/Devices: stable    Discharge Condition: full CODE STATUS: dnr   Diet recommendation: Regular diet.  Brief/Interim Summary: Glenda King was initially admitted to the hospital with the working diagnosis of new onset atrial fibrillation.  86 year old female with past medical history for pancreatic cancer, seizures, hypertensionandhypothyroidism. Reported 3 days of fatigue, generalized weakness, lightheadedness and dizziness. Intermittent palpitations. On the day of admission she was seen as a follow-up at her oncologist office, she was noted to be tachycardic, EKG showed atrial fibrillation with heart rate of 120s to 130s. She was referred to the hospital for further evaluation. On her initial physical examination blood pressure 96/57, heart rate 118, respiratory rate 18, temperature 98.8 F, oxygen saturation 95%. She had dry mucous membranes, lungs clear to auscultation bilaterally, heart S1-S2, no gallops or rubs, abdomen soft, no lower extremity edema.  Sodium 133, potassium 4.5, chloride 99, bicarb 22, glucose 242, BUN 43, creatinine 1.8, white count 22.2, hemoglobin 11.0, hematocrit 33.6, platelets 172. SARS COVID-19 negative. Urinalysis 21-50 white cells, 11-20 red cells, specific gravity 1.015 Chest radiograph with  hyperinflation, increased lung markings bilaterally, no infiltrates. Abdominal film no bowel obstruction pattern.  EKG #1143 bpm, normal axis, right bundle branch block, atrial fibrillation rhythm, no ST segment or T wave changes. #2 116 atrial fibrillation rhythm. #3 96 bpm sinus rhythm.  Patient placed on diltiazem infusion for rate control and anticoagulation with IV heparin.  Paroxysmal atrial fibrillation, transition to oral AV blockade and oral anticoagulation with good toleration.   Heart rate continue to be well controlled with metoprolol. Because high bleeding risk anticoagulation has been discontinued. Patient received PRBC transfusion and IV iron during her hospitalization.  Abdominal MRI will be done before her discharge.   1.  New onset paroxysmal atrial fibrillation.  Patient has returned to sinus rhythm, rate has remained well controlled with AV blockade with metoprolol.  Further work-up with echocardiography showed preserved LV and RV systolic function.  She had a small posterior to the LV pericardial effusion with no tamponade physiology.  She had mild troponin elevation related to tachyarrhythmia.  Patient has a chads vas score of 4 initially placed on heparin drip and transition to apixaban.  Per recommendations from Dr. Marin Olp, patient with high risk of bleeding, this has been discontinued. Not a candidate for therapeutic anticoagulation.  2.  Urinary tract infection, no sepsis.  Patient received 3 days therapy with ceftriaxone with good toleration.  Blood cultures no growth, urine culture contaminated.  Subsequently found to have acute cholecystitis and treated with prolonged antibiotics.  3.  Chronic kidney disease stage IV, hypomagnesemia, hypokalemia patient received supportive medical therapy, continue with oral bicarbonate.  Stabilized.  4.  Pancreatic cancer, localized, chronic anemia of malignancy, anemia of iron deficiency.  Patient follows with Dr.  Marin Olp.  She had radiosurgery 01/2020.  Her iron panel shows severe iron deficiency, iron  15, TIBC 221, transferrin saturation 7, ferritin 196. Patient received IV iron and 2 units packed red blood cells with good toleration.   Added prophylactic pantoprazole.   Physical therapy has recommended continue therapy at SNF.   5. Seizures. Continue to be well controlled with Keppra.   6. Severe protein calorie protein malnutrition. Continue with nutritional supplements.   7.  Acute calculus cholecystitis.  With persistent leukocytosis and history of localized pancreatic cancer, patient underwent MRI of the abdomen that was positive for acute calculus cholecystitis along with intra and extrahepatic biliary dilatation.  LFTs are normal. Percutaneous cholecystostomy tube placement 10/28 by IR. Bile cultures with multiple mixed growth.  Clinically improving. Received 10 days of Rocephin already in the hospital, will continue 4 more days of Keflex and 4 more days of oral Flagyl. Underwent ERCP and biliary stent exchange by gastroenterology 11/1. Clinically improved. Patient has a cholecystostomy tube, IR plan to continue with for 4 to 6 weeks, they will schedule a follow-up. Patient is poor candidate for surgical intervention, may or may not be able to go for cholecystectomy.  Surgery to schedule follow-up in 1 month. Cholecystostomy tube needs to be flushed every day with 5 mL of a sterile water and drain to gravity.  Patient is medically stable to transfer to skilled level of care.  Discharge Diagnoses:  Principal Problem:   New onset a-fib [facility]) Active Problems:   Malignant neoplasm of bladder, unspecified (HCC)   Hypothyroidism   Acute cholecystitis due to biliary calculus   Chronic kidney disease (CKD) stage G4/A1, severely decreased glomerular filtration rate (GFR) between 15-29 mL/min/1.73 square meter and albuminuria creatinine ratio less than 30 mg/g (HCC)   History of seizure    Anemia in chronic renal disease   History of urinary diversion procedure   Bile duct stricture   Primary pancreatic cancer (HCC)   Acute lower UTI   Elevated troponin   Protein-calorie malnutrition, severe    Discharge Instructions  Discharge Instructions    Call MD for:  persistant nausea and vomiting   Complete by: As directed    Call MD for:  severe uncontrolled pain   Complete by: As directed    Call MD for:  temperature >100.4   Complete by: As directed    Diet - low sodium heart healthy   Complete by: As directed    Discharge instructions   Complete by: As directed    Please follow up with primary care in 7 days.   Discharge wound care:   Complete by: As directed    Keep wound dry and clean.  Flush cholecystostomy tube with 5 ml of sterile water every day   Increase activity slowly   Complete by: As directed    Increase activity slowly   Complete by: As directed      Allergies as of 04/13/2020      Reactions   Gadolinium Derivatives Other (See Comments)   Secondary to her stage IV kidney disease   Amoxicillin Other (See Comments)   UNKNOWN REACTION   Pollen Extract Other (See Comments)   Sneezing      Medication List    STOP taking these medications   ferrous sulfate 325 (65 FE) MG tablet     TAKE these medications   cephALEXin 250 MG capsule Commonly known as: KEFLEX Take 1 capsule (250 mg total) by mouth every 12 (twelve) hours for 5 days.   cholecalciferol 25 MCG (1000 UNIT) tablet Commonly known as: VITAMIN D3  Take 1,000 Units by mouth 2 (two) times daily.   cyanocobalamin 1000 MCG tablet Take 1 tablet (1,000 mcg total) by mouth daily.   lactose free nutrition Liqd Take 237 mLs by mouth 2 (two) times daily between meals.   levETIRAcetam 500 MG tablet Commonly known as: KEPPRA Take 500 mg by mouth 2 (two) times daily.   levothyroxine 100 MCG tablet Commonly known as: SYNTHROID Take 100 mcg by mouth daily.   Melatonin 10 MG Caps Take 10  mg by mouth at bedtime.   metoprolol tartrate 25 MG tablet Commonly known as: LOPRESSOR Take 1 tablet (25 mg total) by mouth 2 (two) times daily. What changed:   how much to take  when to take this   metroNIDAZOLE 500 MG tablet Commonly known as: Flagyl Take 1 tablet (500 mg total) by mouth 3 (three) times daily for 5 days.   pantoprazole 40 MG tablet Commonly known as: PROTONIX Take 1 tablet (40 mg total) by mouth daily.   sodium bicarbonate 650 MG tablet Take 650 mg by mouth 2 (two) times daily.            Discharge Care Instructions  (From admission, onward)         Start     Ordered   04/13/20 0000  Discharge wound care:       Comments: Keep wound dry and clean.  Flush cholecystostomy tube with 5 ml of sterile water every day   04/13/20 1005          Contact information for follow-up providers    Javier Glazier, MD Follow up in 1 week(s).   Specialty: Internal Medicine Contact information: [place] [address] [phone]        [facility] .   Specialty: Radiology Contact information: [facility] [id number] [place] [address] [phone]       Armandina Gemma, MD. Go on 05/05/2020.   Specialty: General Surgery Why: Your appointment is 05/05/20 @10 :15am Please arrive 30 minutes prior to your appointment to check in and fill out paperwork. Bring photo ID and insurance information. Contact information: [address] [place] [address] [phone]            Contact information for after-discharge care    Destination    [facility] .   Service: Skilled Nursing Contact information: [address] [place] [address] [phone]                 Allergies  Allergen Reactions  . Gadolinium Derivatives Other (See Comments)    Secondary to her stage IV kidney disease   . Amoxicillin Other  (See Comments)    UNKNOWN REACTION     . Pollen Extract Other (See Comments)    Sneezing    Consultations:  Oncology  General surgery  Gastroenterology  Interventional radiology   Procedures/Studies: DG Abd 1 View  Result Date: 04/02/2020 CLINICAL DATA:  Abdominal distension. EXAM: ABDOMEN - 1 VIEW COMPARISON:  None. FINDINGS: The bowel gas pattern is normal. A radiopaque biliary stent is seen overlying the right upper quadrant. No radio-opaque calculi or other significant radiographic abnormality are seen. Numerous radiopaque surgical clips are seen overlying the pelvis and right lower quadrant. IMPRESSION: 1. Postoperative changes without evidence of bowel obstruction. Electronically Signed   By: Virgina Norfolk M.D.   On: 04/02/2020 22:40   MR ABDOMEN WO CONTRAST  Result Date: 04/06/2020  CLINICAL DATA:  Pancreatic cancer, bladder cancer, assess treatment response, EXAM: MRI ABDOMEN WITHOUT CONTRAST TECHNIQUE: Multiplanar multisequence MR imaging was performed without the administration of intravenous contrast. Patient declined contrast administration. COMPARISON:  CT abdomen pelvis, 12/10/2019, MR abdomen, 09/04/2019 FINDINGS: Examination is generally limited throughout by breath motion artifact and lack of intravenous contrast. Lower chest: No acute findings. Hepatobiliary: There is intra and extrahepatic biliary ductal dilatation, similar to prior examination, with abrupt obstruction of the central common bile duct in the pancreatic head. There appears to be significant scarring and volume loss of the left lobe of the liver. A common bile duct stent appears to be present but is poorly imaged by MR. Numerous gallstones in the gallbladder with severe thickening and inflammatory change about the gallbladder wall, with what appear to be multiple ulcerations of the gallbladder wall (series 10, image 70, 62). There appears to be a gallstone near the gallbladder neck (series 4, image  24). Pancreas: Subtly T1 and T2 hypointense, small mass of the central pancreatic head is again noted, difficult to accurately measure given lack of intravenous contrast and small size but not obviously changed compared to prior examinations (series 10, image 71). Inflammatory changes, or other parenchymal abnormality identified. Spleen:  Within normal limits in size and appearance. Adrenals/Urinary Tract: Atrophic appearance of the bilateral kidneys, with moderate bilateral hydronephrosis. Stomach/Bowel: Visualized portions within the abdomen are unremarkable. Vascular/Lymphatic: No pathologically enlarged lymph nodes identified. No abdominal aortic aneurysm demonstrated. Other:  None. Musculoskeletal: No suspicious bone lesions identified. Pectus deformity of the included lower chest. IMPRESSION: 1. Examination is very limited by lack of intravenous contrast and breath motion artifact. 2. Numerous gallstones in the gallbladder with severe thickening and inflammatory change about the gallbladder wall, with what appear to be multiple ulcerations of the gallbladder wall. Findings are highly concerning for acute cholecystitis. There appears to be a gallstone near the gallbladder neck, which may be obstructing. 3. Subtly T1 and T2 hypointense, small mass of the central pancreatic head is again noted, difficult to accurately measure given lack of intravenous contrast and small size but not obviously changed compared to prior examinations. This remains consistent with small pancreatic adenocarcinoma. 4. Intra and extrahepatic biliary ductal dilatation, with abrupt obstruction of the central common bile duct in the pancreatic head. A common bile duct stent appears to be present but is poorly imaged by MR. 5. Marked postobstructive scarring and volume loss of the left lobe of the liver, similar to prior examination. 6. Moderate bilateral hydronephrosis, slightly increased compared to prior examination. Distal ureters and  bladder are not imaged. Consider additional imaging of the pelvis given reported history of bladder cancer. Electronically Signed   By: Eddie Candle M.D.   On: 04/06/2020 16:16   MR 3D Recon At Scanner  Result Date: 04/06/2020 CLINICAL DATA:  Pancreatic cancer, bladder cancer, assess treatment response, EXAM: MRI ABDOMEN WITHOUT CONTRAST TECHNIQUE: Multiplanar multisequence MR imaging was performed without the administration of intravenous contrast. Patient declined contrast administration. COMPARISON:  CT abdomen pelvis, 12/10/2019, MR abdomen, 09/04/2019 FINDINGS: Examination is generally limited throughout by breath motion artifact and lack of intravenous contrast. Lower chest: No acute findings. Hepatobiliary: There is intra and extrahepatic biliary ductal dilatation, similar to prior examination, with abrupt obstruction of the central common bile duct in the pancreatic head. There appears to be significant scarring and volume loss of the left lobe of the liver. A common bile duct stent appears to be present but is poorly imaged by MR.  Numerous gallstones in the gallbladder with severe thickening and inflammatory change about the gallbladder wall, with what appear to be multiple ulcerations of the gallbladder wall (series 10, image 70, 62). There appears to be a gallstone near the gallbladder neck (series 4, image 24). Pancreas: Subtly T1 and T2 hypointense, small mass of the central pancreatic head is again noted, difficult to accurately measure given lack of intravenous contrast and small size but not obviously changed compared to prior examinations (series 10, image 71). Inflammatory changes, or other parenchymal abnormality identified. Spleen:  Within normal limits in size and appearance. Adrenals/Urinary Tract: Atrophic appearance of the bilateral kidneys, with moderate bilateral hydronephrosis. Stomach/Bowel: Visualized portions within the abdomen are unremarkable. Vascular/Lymphatic: No  pathologically enlarged lymph nodes identified. No abdominal aortic aneurysm demonstrated. Other:  None. Musculoskeletal: No suspicious bone lesions identified. Pectus deformity of the included lower chest. IMPRESSION: 1. Examination is very limited by lack of intravenous contrast and breath motion artifact. 2. Numerous gallstones in the gallbladder with severe thickening and inflammatory change about the gallbladder wall, with what appear to be multiple ulcerations of the gallbladder wall. Findings are highly concerning for acute cholecystitis. There appears to be a gallstone near the gallbladder neck, which may be obstructing. 3. Subtly T1 and T2 hypointense, small mass of the central pancreatic head is again noted, difficult to accurately measure given lack of intravenous contrast and small size but not obviously changed compared to prior examinations. This remains consistent with small pancreatic adenocarcinoma. 4. Intra and extrahepatic biliary ductal dilatation, with abrupt obstruction of the central common bile duct in the pancreatic head. A common bile duct stent appears to be present but is poorly imaged by MR. 5. Marked postobstructive scarring and volume loss of the left lobe of the liver, similar to prior examination. 6. Moderate bilateral hydronephrosis, slightly increased compared to prior examination. Distal ureters and bladder are not imaged. Consider additional imaging of the pelvis given reported history of bladder cancer. Electronically Signed   By: Eddie Candle M.D.   On: 04/06/2020 16:16   IR Perc Cholecystostomy  Result Date: 04/09/2020 INDICATION: 86 year old female with acute calculus cholecystitis. She is not an operative candidate and therefore presents for placement of a percutaneous cholecystostomy tube. EXAM: CHOLECYSTOSTOMY MEDICATIONS: 1 g vancomycin; The antibiotic was administered within an appropriate time frame prior to the initiation of the procedure. ANESTHESIA/SEDATION:  Moderate (conscious) sedation was employed during this procedure. A total of Versed 0.5 mg and Fentanyl 25 mcg and 0.5 mg Dilaudid was administered intravenously. Moderate Sedation Time: 11 minutes. The patient's level of consciousness and vital signs were monitored continuously by radiology nursing throughout the procedure under my direct supervision. FLUOROSCOPY TIME:  Fluoroscopy Time: 0 minutes 54 seconds (9 mGy). COMPLICATIONS: None immediate. PROCEDURE: Informed written consent was obtained from the patient after a thorough discussion of the procedural risks, benefits and alternatives. All questions were addressed. Maximal Sterile Barrier Technique was utilized including caps, mask, sterile gowns, sterile gloves, sterile drape, hand hygiene and skin antiseptic. A timeout was performed prior to the initiation of the procedure. The right upper quadrant was interrogated with ultrasound. The gallbladder is distended thick walled contains multiple echogenic foci consistent with stones. A suitable skin entry site was selected and marked. Local anesthesia was attained by infiltration with 1% lidocaine. A small dermatotomy was made. Under real-time ultrasound guidance, a 21 gauge Accustick needle was advanced along a short transhepatic tract and into the gallbladder lumen. A 0.018 wire was then coiled in  the gallbladder lumen. The needle was exchanged for the Accustick sheath dilator which was advanced into the gallbladder lumen. The 0.018 wire was then exchanged for a 75 cm superstiff Amplatz wire. The Accustick sheath was then removed and the skin and transhepatic tract dilated to 10 [place]. A Cook 10.2 [place] all-purpose drainage catheter was then advanced over the wire and formed in the gallbladder lumen. Aspiration yields frankly purulent bile. A sample was sent for Gram stain and culture. A gentle hand injection of contrast material was then performed opacifying the gallbladder lumen. Multiple faceted filling  defects are present consistent with cholelithiasis. The tube was gently flushed with saline, connected to gravity bag drainage and secured to the skin with 0 Prolene suture. The patient tolerated the procedure well. IMPRESSION: Successful placement of a transhepatic percutaneous cholecystostomy tube for a diagnosis of acute calculus cholecystitis. PLAN: 1. Maintain drain to gravity bag. 2. Flush at least once per shift. 3. Cultures are pending from purulent bile. Signed, Criselda Peaches, MD, [facility] Electronically Signed   By: Jacqulynn Cadet M.D.   On: 04/09/2020 09:24   DG Chest Portable 1 View  Result Date: 04/02/2020 CLINICAL DATA:  Atrial fibrillation. EXAM: PORTABLE CHEST 1 VIEW COMPARISON:  September 03, 2019. FINDINGS: The heart size and mediastinal contours are within normal limits. Both lungs are clear. No pneumothorax or pleural effusion is noted. The visualized skeletal structures are unremarkable. IMPRESSION: No active disease. Electronically Signed   By: Marijo Conception M.D.   On: 04/02/2020 13:00   DG ERCP  Result Date: 04/12/2020 CLINICAL DATA:  86 year old female with malignant obstructed jaundice secondary to pancreatic cancer. EXAM: ERCP TECHNIQUE: Multiple spot images obtained with the fluoroscopic device and submitted for interpretation post-procedure. FLUOROSCOPY TIME:  Fluoroscopy Time:  3 minutes 15 seconds COMPARISON:  MRCP 04/06/2020 FINDINGS: A total of 14 intraoperative saved images are submitted for review. The initial images demonstrate a flexible duodenal scope in the descending duodenum. A percutaneous cholecystostomy tube is present. Additionally, there is a plastic biliary drainage catheter. Subsequent images demonstrate balloon dilation of the descending duodenum. Following this maneuver, next the previously placed plastic biliary stent was removed and there is deep wire cannulation of the left  intrahepatic ducts. Subsequent images demonstrate introduction of a metallic biliary stent which is then deployed across a stenosis in the distal common bile duct. The stent is partially open on the final image. IMPRESSION: 1. Balloon dilation of the descending duodenum. 2. Removal of plastic biliary stent and placement of a self expanding metal biliary stent. These images were submitted for radiologic interpretation only. Please see the procedural report for the amount of contrast and the fluoroscopy time utilized. Electronically Signed   By: Jacqulynn Cadet M.D.   On: 04/12/2020 15:59   ECHOCARDIOGRAM COMPLETE  Result Date: 04/03/2020    ECHOCARDIOGRAM REPORT   Patient Name:   MARKETA MIDKIFF Lammert Date of Exam: 04/03/2020 Medical Rec #:  [id number]            Height:       68.4 in Accession #:    [id number]           Weight:       97.9 lb Date of Birth:  Aug 05, 1928           BSA:          1.515 m Patient Age:    25 years  BP:           119/59 mmHg Patient Gender: F                    HR:           78 bpm. Exam Location:  Inpatient Procedure: 2D Echo, Color Doppler and Cardiac Doppler Indications:    I48.91* Unspecified atrial fibrillation  History:        Patient has no prior history of Echocardiogram examinations.                 Arrythmias:Atrial Fibrillation; Risk Factors:Cancer.  Sonographer:    Raquel Sarna Senior RDCS Referring Phys: [place]  Sonographer Comments: Technically difficult due to thin body habitus. IMPRESSIONS  1. Left ventricular ejection fraction, by estimation, is 60 to 65%. The left ventricle has normal function. The left ventricle has no regional wall motion abnormalities. Left ventricular diastolic parameters were normal.  2. Right ventricular systolic function is normal. The right ventricular size is normal. There is normal pulmonary artery systolic pressure. The estimated right ventricular systolic pressure is 44.6 mmHg.  3. A small pericardial effusion is  present. The pericardial effusion is posterior to the left ventricle. There is no evidence of cardiac tamponade.  4. The mitral valve is grossly normal. Trivial mitral valve regurgitation. No evidence of mitral stenosis.  5. The aortic valve is tricuspid. There is mild thickening of the aortic valve. Aortic valve regurgitation is not visualized. No aortic stenosis is present.  6. The inferior vena cava is normal in size with greater than 50% respiratory variability, suggesting right atrial pressure of 3 mmHg. FINDINGS  Left Ventricle: Left ventricular ejection fraction, by estimation, is 60 to 65%. The left ventricle has normal function. The left ventricle has no regional wall motion abnormalities. The left ventricular internal cavity size was normal in size. There is  no left ventricular hypertrophy. Left ventricular diastolic parameters were normal. Right Ventricle: The right ventricular size is normal. No increase in right ventricular wall thickness. Right ventricular systolic function is normal. There is normal pulmonary artery systolic pressure. The tricuspid regurgitant velocity is 2.19 m/s, and  with an assumed right atrial pressure of 3 mmHg, the estimated right ventricular systolic pressure is 28.6 mmHg. Left Atrium: Left atrial size was normal in size. Right Atrium: Right atrial size was normal in size. Pericardium: A small pericardial effusion is present. The pericardial effusion is posterior to the left ventricle. There is no evidence of cardiac tamponade. Presence of pericardial fat pad. Mitral Valve: The mitral valve is grossly normal. Trivial mitral valve regurgitation. No evidence of mitral valve stenosis. Tricuspid Valve: The tricuspid valve is grossly normal. Tricuspid valve regurgitation is trivial. No evidence of tricuspid stenosis. Aortic Valve: The aortic valve is tricuspid. There is mild thickening of the aortic valve. Aortic valve regurgitation is not visualized. No aortic stenosis is  present. Pulmonic Valve: The pulmonic valve was grossly normal. Pulmonic valve regurgitation is trivial. No evidence of pulmonic stenosis. Aorta: The aortic root and ascending aorta are structurally normal, with no evidence of dilitation. Venous: The inferior vena cava is normal in size with greater than 50% respiratory variability, suggesting right atrial pressure of 3 mmHg. IAS/Shunts: The atrial septum is grossly normal.  LEFT VENTRICLE PLAX 2D LVIDd:         3.70 cm  Diastology LVIDs:         2.10 cm  LV e' medial:    9.36 cm/s LV  PW:         1.00 cm  LV E/e' medial:  10.4 LV IVS:        0.80 cm  LV e' lateral:   8.59 cm/s LVOT diam:     1.70 cm  LV E/e' lateral: 11.3 LV SV:         39 LV SV Index:   26 LVOT Area:     2.27 cm  LEFT ATRIUM             Index       RIGHT ATRIUM           Index LA diam:        2.80 cm 1.85 cm/m  RA Area:     10.60 cm LA Vol (A2C):   50.8 ml 33.54 ml/m RA Volume:   16.10 ml  10.63 ml/m LA Vol (A4C):   32.7 ml 21.59 ml/m LA Biplane Vol: 40.5 ml 26.74 ml/m  AORTIC VALVE LVOT Vmax:   93.30 cm/s LVOT Vmean:  68.800 cm/s LVOT VTI:    0.174 m  AORTA Ao Root diam: 2.90 cm Ao Asc diam:  3.10 cm MITRAL VALVE               TRICUSPID VALVE MV Area (PHT): 3.60 cm    TR Peak grad:   19.2 mmHg MV Decel Time: 211 msec    TR Vmax:        219.00 cm/s MV E velocity: 97.30 cm/s MV A velocity: 67.70 cm/s  SHUNTS MV E/A ratio:  1.44        Systemic VTI:  0.17 m                            Systemic Diam: 1.70 cm Eleonore Chiquito MD Electronically signed by Eleonore Chiquito MD Signature Date/Time: 04/03/2020/12:31:01 PM    Final       Subjective: Patient seen and examined.  No overnight events.  She is eager to get out of the hospital and go to SNF. Discharge Exam: Vitals:   04/12/20 2019 04/13/20 0449  BP: (!) 147/82 (!) 117/56  Pulse: 80 66  Resp: 18 18  Temp: 98 F (36.7 C) 98.3 F (36.8 C)  SpO2: 97% 95%   Vitals:   04/12/20 1520 04/12/20 1544 04/12/20 2019 04/13/20 0449  BP: (!)  125/57 127/69 (!) 147/82 (!) 117/56  Pulse: 78 73 80 66  Resp: 20  18 18   Temp:  97.7 F (36.5 C) 98 F (36.7 C) 98.3 F (36.8 C)  TempSrc:  Oral Oral Oral  SpO2: 94% 94% 97% 95%  Weight:      Height:       Physical Exam Constitutional:      Appearance: Normal appearance.     Comments: Looks comfortable.  Eating breakfast.  On room air.  She is underweight and cachectic but not in any distress.  HENT:     Head: Atraumatic.  Cardiovascular:     Rate and Rhythm: Regular rhythm.  Pulmonary:     Breath sounds: Normal breath sounds.  Abdominal:     General: Bowel sounds are normal.     Palpations: Abdomen is soft.     Comments: Mild tenderness along the right upper quadrant.  Cholecystostomy tube with bile drainage and some tinge of blood.      The results of significant diagnostics from this hospitalization (including imaging, microbiology, ancillary and laboratory) are listed below  for reference.     Microbiology: Recent Results (from the past 240 hour(s))  SARS Coronavirus 2 by RT PCR (hospital order, performed in [facility] hospital lab) Nasopharyngeal Nasopharyngeal Swab     Status: None   Collection Time: 04/06/20  2:46 PM   Specimen: Nasopharyngeal Swab  Result Value Ref Range Status   SARS Coronavirus 2 NEGATIVE NEGATIVE Final    Comment: (NOTE) SARS-CoV-2 target nucleic acids are NOT DETECTED.  The SARS-CoV-2 RNA is generally detectable in upper and lower respiratory specimens during the acute phase of infection. The lowest concentration of SARS-CoV-2 viral copies this assay can detect is 250 copies / mL. A negative result does not preclude SARS-CoV-2 infection and should not be used as the sole basis for treatment or other patient management decisions.  A negative result may occur with improper specimen collection / handling, submission of specimen other than nasopharyngeal swab, presence of viral mutation(s) within the areas targeted by this assay, and  inadequate number of viral copies (<250 copies / mL). A negative result must be combined with clinical observations, patient history, and epidemiological information.  Fact Sheet for Patients:   StrictlyIdeas.no  Fact Sheet for Healthcare Providers: BankingDealers.co.za  This test is not yet approved or  cleared by the [place] FDA and has been authorized for detection and/or diagnosis of SARS-CoV-2 by FDA under an Emergency Use Authorization (EUA).  This EUA will remain in effect (meaning this test can be used) for the duration of the COVID-19 declaration under Section 564(b)(1) of the Act, 21 U.S.C. section 360bbb-3(b)(1), unless the authorization is terminated or revoked sooner.  Performed at [facility], [place] [address] [place], [place] [address]   Aerobic/Anaerobic Culture (surgical/deep wound)     Status: Abnormal (Preliminary result)   Collection Time: 04/08/20  5:00 PM   Specimen: BILE; Abscess  Result Value Ref Range Status   Specimen Description   Final    BILE ABSCESS Performed at [place] [address], [place], [place] [address]    Special Requests   Final    NONE Performed at [facility], [place] [address], [place], [place] [address]    Gram Stain   Final    ABUNDANT WBC PRESENT, PREDOMINANTLY PMN ABUNDANT GRAM POSITIVE COCCI RARE GRAM NEGATIVE RODS Performed at [facility], [place] [address], [place], [place] [address]    Culture (A)  Final    MULTIPLE ORGANISMS PRESENT, NONE PREDOMINANT NO ANAEROBES ISOLATED; CULTURE IN PROGRESS FOR 5 DAYS    Report Status PENDING  Incomplete  Respiratory Panel by RT PCR (Flu A&B, Covid) - Nasopharyngeal Swab     Status: None   Collection Time: 04/12/20  5:00 PM   Specimen: Nasopharyngeal Swab  Result Value Ref Range Status   SARS Coronavirus 2 by RT PCR NEGATIVE NEGATIVE Final    Comment:  (NOTE) SARS-CoV-2 target nucleic acids are NOT DETECTED.  The SARS-CoV-2 RNA is generally detectable in upper respiratoy specimens during the acute phase of infection. The lowest concentration of SARS-CoV-2 viral copies this assay can detect is 131 copies/mL. A negative result does not preclude SARS-Cov-2 infection and should not be used as the sole basis for treatment or other patient management decisions. A negative result may occur with  improper specimen collection/handling, submission of specimen other than nasopharyngeal swab, presence of viral mutation(s) within the areas targeted by this assay, and inadequate number of viral copies (<131 copies/mL). A negative result must be  combined with clinical observations, patient history, and epidemiological information. The expected result is Negative.  Fact Sheet for Patients:  PinkCheek.be  Fact Sheet for Healthcare Providers:  GravelBags.it  This test is no t yet approved or cleared by the [place] FDA and  has been authorized for detection and/or diagnosis of SARS-CoV-2 by FDA under an Emergency Use Authorization (EUA). This EUA will remain  in effect (meaning this test can be used) for the duration of the COVID-19 declaration under Section 564(b)(1) of the Act, 21 U.S.C. section 360bbb-3(b)(1), unless the authorization is terminated or revoked sooner.     Influenza A by PCR NEGATIVE NEGATIVE Final   Influenza B by PCR NEGATIVE NEGATIVE Final    Comment: (NOTE) The Xpert Xpress SARS-CoV-2/FLU/RSV assay is intended as an aid in  the diagnosis of influenza from Nasopharyngeal swab specimens and  should not be used as a sole basis for treatment. Nasal washings and  aspirates are unacceptable for Xpert Xpress SARS-CoV-2/FLU/RSV  testing.  Fact Sheet for Patients: PinkCheek.be  Fact Sheet for Healthcare  Providers: GravelBags.it  This test is not yet approved or cleared by the [place] FDA and  has been authorized for detection and/or diagnosis of SARS-CoV-2 by  FDA under an Emergency Use Authorization (EUA). This EUA will remain  in effect (meaning this test can be used) for the duration of the  Covid-19 declaration under Section 564(b)(1) of the Act, 21  U.S.C. section 360bbb-3(b)(1), unless the authorization is  terminated or revoked. Performed at [facility], [place] [address], [place], [place] [address]      Labs: BNP (last 3 results) Recent Labs    04/02/20 2205  BNP 001.7*   Basic Metabolic Panel: Recent Labs  Lab 04/07/20 0406 04/08/20 0330 04/09/20 0739 04/12/20 0406  NA 134* 135 141 139  K 4.1 4.3 4.2 4.2  CL 107 108 110 114*  CO2 18* 18* 19* 19*  GLUCOSE 99 160* 98 115*  BUN 50* 52* 40* 28*  CREATININE 1.61* 1.68* 1.68* 1.42*  CALCIUM 8.4* 8.2* 8.9 8.3*  MG  --  2.0  --   --   PHOS  --  3.7  --   --    Liver Function Tests: Recent Labs  Lab 04/07/20 0406 04/08/20 0330 04/09/20 0739 04/12/20 0406  AST 18 17 17 17   ALT 26 24 20 17   ALKPHOS 167* 168* 158* 114  BILITOT 1.1 0.8 1.0 0.8  PROT 6.0* 5.7* 6.6 5.9*  ALBUMIN 2.2* 2.0* 2.3* 2.3*   No results for input(s): LIPASE, AMYLASE in the last 168 hours. No results for input(s): AMMONIA in the last 168 hours. CBC: Recent Labs  Lab 04/09/20 0739 04/10/20 0409 04/11/20 0556 04/12/20 0406 04/13/20 0418  WBC 12.2* 10.2 10.5 10.0 9.5  NEUTROABS 10.6* 8.5* 8.7* 8.2* 8.2*  HGB 11.1* 10.3* 10.6* 10.5* 10.1*  HCT 35.3* 32.2* 33.9* 33.3* 32.0*  MCV 101.1* 100.6* 101.2* 101.8* 101.6*  PLT 216 221 247 238 225   Cardiac Enzymes: No results for input(s): CKTOTAL, CKMB, CKMBINDEX, TROPONINI in the last 168 hours. BNP: Invalid input(s): POCBNP CBG: Recent Labs  Lab 04/06/20 1524  GLUCAP 106*   D-Dimer No results for input(s): DDIMER in the last 72  hours. Hgb A1c No results for input(s): HGBA1C in the last 72 hours. Lipid Profile No results for input(s): CHOL, HDL, LDLCALC, TRIG, CHOLHDL, LDLDIRECT in the last 72 hours. Thyroid function studies No results for input(s): TSH, T4TOTAL, T3FREE, THYROIDAB in  the last 72 hours.  Invalid input(s): FREET3 Anemia work up No results for input(s): VITAMINB12, FOLATE, FERRITIN, TIBC, IRON, RETICCTPCT in the last 72 hours. Urinalysis    Component Value Date/Time   COLORURINE YELLOW 04/02/2020 1308   APPEARANCEUR CLOUDY (A) 04/02/2020 1308   LABSPEC 1.015 04/02/2020 1308   PHURINE 7.5 04/02/2020 1308   GLUCOSEU NEGATIVE 04/02/2020 1308   HGBUR MODERATE (A) 04/02/2020 1308   BILIRUBINUR SMALL (A) 04/02/2020 1308   KETONESUR NEGATIVE 04/02/2020 1308   PROTEINUR 100 (A) 04/02/2020 1308   NITRITE NEGATIVE 04/02/2020 1308   LEUKOCYTESUR MODERATE (A) 04/02/2020 1308   Sepsis Labs Invalid input(s): PROCALCITONIN,  WBC,  LACTICIDVEN Microbiology Recent Results (from the past 240 hour(s))  SARS Coronavirus 2 by RT PCR (hospital order, performed in [place] hospital lab) Nasopharyngeal Nasopharyngeal Swab     Status: None   Collection Time: 04/06/20  2:46 PM   Specimen: Nasopharyngeal Swab  Result Value Ref Range Status   SARS Coronavirus 2 NEGATIVE NEGATIVE Final    Comment: (NOTE) SARS-CoV-2 target nucleic acids are NOT DETECTED.  The SARS-CoV-2 RNA is generally detectable in upper and lower respiratory specimens during the acute phase of infection. The lowest concentration of SARS-CoV-2 viral copies this assay can detect is 250 copies / mL. A negative result does not preclude SARS-CoV-2 infection and should not be used as the sole basis for treatment or other patient management decisions.  A negative result may occur with improper specimen collection / handling, submission of specimen other than nasopharyngeal swab, presence of viral mutation(s) within the areas targeted by this  assay, and inadequate number of viral copies (<250 copies / mL). A negative result must be combined with clinical observations, patient history, and epidemiological information.  Fact Sheet for Patients:   StrictlyIdeas.no  Fact Sheet for Healthcare Providers: BankingDealers.co.za  This test is not yet approved or  cleared by the [place] FDA and has been authorized for detection and/or diagnosis of SARS-CoV-2 by FDA under an Emergency Use Authorization (EUA).  This EUA will remain in effect (meaning this test can be used) for the duration of the COVID-19 declaration under Section 564(b)(1) of the Act, 21 U.S.C. section 360bbb-3(b)(1), unless the authorization is terminated or revoked sooner.  Performed at [facility], [place] [address] [place], [place] [address]   Aerobic/Anaerobic Culture (surgical/deep wound)     Status: Abnormal (Preliminary result)   Collection Time: 04/08/20  5:00 PM   Specimen: BILE; Abscess  Result Value Ref Range Status   Specimen Description   Final    BILE ABSCESS Performed at [place] [address] [place], [place] [address]    Special Requests   Final    NONE Performed at [facility], [place] [address] [place], [place] [address]    Gram Stain   Final    ABUNDANT WBC PRESENT, PREDOMINANTLY PMN ABUNDANT GRAM POSITIVE COCCI RARE GRAM NEGATIVE RODS Performed at [facility], [place] [address] [place], [place] [address]    Culture (A)  Final    MULTIPLE ORGANISMS PRESENT, NONE PREDOMINANT NO ANAEROBES ISOLATED; CULTURE IN PROGRESS FOR 5 DAYS    Report Status PENDING  Incomplete  Respiratory Panel by RT PCR (Flu A&B, Covid) - Nasopharyngeal Swab     Status: None   Collection Time: 04/12/20  5:00 PM   Specimen: Nasopharyngeal Swab  Result Value Ref Range Status   SARS Coronavirus 2 by RT PCR NEGATIVE NEGATIVE Final  Comment:  (NOTE) SARS-CoV-2 target nucleic acids are NOT DETECTED.  The SARS-CoV-2 RNA is generally detectable in upper respiratoy specimens during the acute phase of infection. The lowest concentration of SARS-CoV-2 viral copies this assay can detect is 131 copies/mL. A negative result does not preclude SARS-Cov-2 infection and should not be used as the sole basis for treatment or other patient management decisions. A negative result may occur with  improper specimen collection/handling, submission of specimen other than nasopharyngeal swab, presence of viral mutation(s) within the areas targeted by this assay, and inadequate number of viral copies (<131 copies/mL). A negative result must be combined with clinical observations, patient history, and epidemiological information. The expected result is Negative.  Fact Sheet for Patients:  PinkCheek.be  Fact Sheet for Healthcare Providers:  GravelBags.it  This test is no t yet approved or cleared by the [place] FDA and  has been authorized for detection and/or diagnosis of SARS-CoV-2 by FDA under an Emergency Use Authorization (EUA). This EUA will remain  in effect (meaning this test can be used) for the duration of the COVID-19 declaration under Section 564(b)(1) of the Act, 21 U.S.C. section 360bbb-3(b)(1), unless the authorization is terminated or revoked sooner.     Influenza A by PCR NEGATIVE NEGATIVE Final   Influenza B by PCR NEGATIVE NEGATIVE Final    Comment: (NOTE) The Xpert Xpress SARS-CoV-2/FLU/RSV assay is intended as an aid in  the diagnosis of influenza from Nasopharyngeal swab specimens and  should not be used as a sole basis for treatment. Nasal washings and  aspirates are unacceptable for Xpert Xpress SARS-CoV-2/FLU/RSV  testing.  Fact Sheet for Patients: PinkCheek.be  Fact Sheet for Healthcare  Providers: GravelBags.it  This test is not yet approved or cleared by the [place] FDA and  has been authorized for detection and/or diagnosis of SARS-CoV-2 by  FDA under an Emergency Use Authorization (EUA). This EUA will remain  in effect (meaning this test can be used) for the duration of the  Covid-19 declaration under Section 564(b)(1) of the Act, 21  U.S.C. section 360bbb-3(b)(1), unless the authorization is  terminated or revoked. Performed at [facility], [place] [address], [place], [place] [address]      Time coordinating discharge: 45 minutes  SIGNED:   Barb Merino, MD  [facility] 04/13/2020, 10:12 AM

## 2020-04-13 NOTE — Progress Notes (Signed)
Occupational Therapy Treatment Patient Details Name: Glenda King MRN: [id number] DOB: 12/23/1928 Today's Date: 04/13/2020    History of present illness 86 y.o. female with medical history significant of  Pancreatic Ca seizure episode, HTN, hypothyroidism, sp cystectomy now self catheterizes 3-4 times a day. Pt admitted with fatigue, dizziness, a fall. Dx of new afib, sepsis, UTI.   OT comments  Patient agreeable to functional ambulation "I want to walk." Patient mod I for bed mobility, supervision with sit to stand and cues for hand placement not to pull on walker. Patient supervision level with rolling walker for functional ambulation ~355ft. Patient happy she is supposed to D/C home today.   Follow Up Recommendations  Home health OT    Equipment Recommendations  None recommended by OT       Precautions / Restrictions Precautions Precautions: Fall Precaution Comments: R abd drain Restrictions Weight Bearing Restrictions: No       Mobility Bed Mobility Overal bed mobility: Modified Independent                Transfers Overall transfer level: Needs assistance Equipment used: Rolling walker (2 wheeled) Transfers: Sit to/from Stand Sit to Stand: Supervision         General transfer comment: for safety    Balance Overall balance assessment: Needs assistance Sitting-balance support: Feet supported Sitting balance-Leahy Scale: Fair     Standing balance support: Bilateral upper extremity supported Standing balance-Leahy Scale: Fair Standing balance comment: steady with RW for dynamic standing                           ADL either performed or assessed with clinical judgement   ADL Overall ADL's : Needs assistance/impaired                         Toilet Transfer: Supervision/safety;Ambulation;RW Toilet Transfer Details (indicate cue type and reason): simulated with functional ambulation and transfer to recliner, supervision for  safety          Functional mobility during ADLs: Supervision/safety;Rolling walker                 Cognition Arousal/Alertness: Awake/alert Behavior During Therapy: WFL for tasks assessed/performed Overall Cognitive Status: Within Functional Limits for tasks assessed                                                     Pertinent Vitals/ Pain       Pain Assessment: Faces Faces Pain Scale: No hurt         Frequency  Min 2X/week        Progress Toward Goals  OT Goals(current goals can now be found in the care plan section)  Progress towards OT goals: Progressing toward goals  Acute Rehab OT Goals Patient Stated Goal: to return home, go to mass daily OT Goal Formulation: With patient Time For Goal Achievement: 04/17/20 Potential to Achieve Goals: Good  Plan Discharge plan remains appropriate       AM-PAC OT "6 Clicks" Daily Activity     Outcome Measure   Help from another person eating meals?: None Help from another person taking care of personal grooming?: A Little Help from another person toileting, which includes using toliet, bedpan, or urinal?: A Little Help from another  person bathing (including washing, rinsing, drying)?: A Little Help from another person to put on and taking off regular upper body clothing?: A Little Help from another person to put on and taking off regular lower body clothing?: A Little 6 Click Score: 19    End of Session Equipment Utilized During Treatment: Rolling walker  OT Visit Diagnosis: Unsteadiness on feet (R26.81);Muscle weakness (generalized) (M62.81);Pain Pain - Right/Left: Right Pain - part of body:  (abdomen)   Activity Tolerance Patient tolerated treatment well   Patient Left in chair;with call bell/phone within reach;with chair alarm set   Nurse Communication Mobility status        Time: 4970-2637 OT Time Calculation (min): 19 min  Charges: OT General Charges $OT Visit: 1 Visit OT  Treatments $Self Care/Home Management : 8-22 mins  Delbert Phenix OT OT pager: [place] 04/13/2020, 11:17 AM

## 2020-04-13 NOTE — Progress Notes (Signed)
Patient should be discharged home today. She is already scheduled a follow up appointment next week.   Oncology Nurse Navigator Documentation  Oncology Nurse Navigator Flowsheets 04/13/2020  Abnormal Finding Date -  Diagnosis Status -  Planned Course of Treatment -  Phase of Treatment -  Radiation Actual Start Date: -  Radiation Expected End Date: -  Radiation Actual End Date: -  Navigator Follow Up Date: 04/26/2020  Navigator Follow Up Reason: Follow-up Appointment  Navigation Complete Date: -  Navigator Location [facility]  Referral Date to RadOnc/MedOnc -  Navigator Encounter Type Appt/Treatment Plan Review  Telephone -  Patient Visit Type MedOnc  Treatment Phase Post-Tx Follow-up  Barriers/Navigation Needs Anxiety;Coordination of Care  Education -  Interventions None Required  Acuity Level 2-Minimal Needs (1-2 Barriers Identified)  Referrals -  Coordination of Care -  Education Method -  Support Groups/Services Friends and Family  Time Spent with Patient 15

## 2020-04-13 NOTE — Progress Notes (Signed)
Referring Physician(s): B. Longbranch PA  Supervising Physician: Corrie Mckusick  Patient Status:  [facility] - In-pt  Chief Complaint:  Abdominal pain found to have acute calculus cholecystitis s/p cholecystomy tube placed on 10.29.21 by Dr. Laurence Ferrari  Subjective:  History of localized pancreatic cancer s/p pancreatic stent placement on 3.26.21 last exchanged on 11.1.21. Admitted to [facility] for fatigue, weakness found to have a fib with RVR. Hospital stay complicated by abdominal pain. MRI performed on 10.26.21 indicative for acute cholecystitis. IR placed a cholecystomy tube on 10.28.21. Patient tolerating diet a this time reports improving abdominal pain and denies and nausea or vomiting.   Patient is eager to be discharged and is requesting corn flakes at this time.   Allergies: Gadolinium derivatives, Amoxicillin, and Pollen extract  Medications: Prior to Admission medications   Medication Sig Start Date End Date Taking? Authorizing Provider  cholecalciferol (VITAMIN D3) 25 MCG (1000 UNIT) tablet Take 1,000 Units by mouth 2 (two) times daily.   Yes [provider]  levETIRAcetam (KEPPRA) 500 MG tablet Take 500 mg by mouth 2 (two) times daily. 05/13/19  Yes [provider]  levothyroxine (SYNTHROID) 100 MCG tablet Take 100 mcg by mouth daily. 05/13/19  Yes [provider]  Melatonin 10 MG CAPS Take 10 mg by mouth at bedtime.   Yes [provider]  sodium bicarbonate 650 MG tablet Take 650 mg by mouth 2 (two) times daily. 05/27/19  Yes [provider]  vitamin B-12 1000 MCG tablet Take 1 tablet (1,000 mcg total) by mouth daily. 09/06/19  Yes Regalado, Belkys A, MD  cephALEXin (KEFLEX) 250 MG capsule Take 1 capsule (250 mg total) by mouth every 12 (twelve) hours for 5 days. 04/13/20 04/18/20  Barb Merino, MD  lactose free nutrition (BOOST PLUS) LIQD Take 237 mLs by mouth 2 (two) times daily between meals. 04/06/20 05/06/20  Arrien, Jimmy Picket, MD    metoprolol tartrate (LOPRESSOR) 25 MG tablet Take 1 tablet (25 mg total) by mouth 2 (two) times daily. 04/06/20 05/06/20  Arrien, Jimmy Picket, MD  metroNIDAZOLE (FLAGYL) 500 MG tablet Take 1 tablet (500 mg total) by mouth 3 (three) times daily for 5 days. 04/13/20 04/18/20  Barb Merino, MD  pantoprazole (PROTONIX) 40 MG tablet Take 1 tablet (40 mg total) by mouth daily. 04/07/20 05/07/20  Arrien, Jimmy Picket, MD     Vital Signs: BP (!) 117/56 (BP Location: Left Arm)   Pulse 66   Temp 98.3 F (36.8 C) (Oral)   Resp 18   Ht 5' 8.4" (1.737 m)   Wt 97 lb 3.6 oz (44.1 kg)   SpO2 95%   BMI 14.61 kg/m   Physical Exam Vitals and nursing note reviewed.  Constitutional:      Appearance: She is well-developed.  HENT:     Head: Normocephalic and atraumatic.  Eyes:     Conjunctiva/sclera: Conjunctivae normal.  Pulmonary:     Effort: Pulmonary effort is normal.  Abdominal:     Comments:  Positive RUQ cholecystomy tube. Site in unremarkable with no erythema,m edema, tenderness,  Bleeding or draining noted at the exit site. Suture and stat lock in place. Dressing is clean, dry and intact. Drain is able to be flushed easily. 10 ml of brown output noted to be in the gravity bag at the time of evaluation.   Musculoskeletal:        General: Normal range of motion.     Cervical back: Normal range of motion.  Skin:  General: Skin is warm.  Neurological:     Mental Status: She is alert and oriented to person, place, and time.     Imaging: DG ERCP  Result Date: 04/12/2020 CLINICAL DATA:  86 year old female with malignant obstructed jaundice secondary to pancreatic cancer. EXAM: ERCP TECHNIQUE: Multiple spot images obtained with the fluoroscopic device and submitted for interpretation post-procedure. FLUOROSCOPY TIME:  Fluoroscopy Time:  3 minutes 15 seconds COMPARISON:  MRCP 04/06/2020 FINDINGS: A total of 14 intraoperative saved images are submitted for review. The initial images  demonstrate a flexible duodenal scope in the descending duodenum. A percutaneous cholecystostomy tube is present. Additionally, there is a plastic biliary drainage catheter. Subsequent images demonstrate balloon dilation of the descending duodenum. Following this maneuver, next the previously placed plastic biliary stent was removed and there is deep wire cannulation of the left intrahepatic ducts. Subsequent images demonstrate introduction of a metallic biliary stent which is then deployed across a stenosis in the distal common bile duct. The stent is partially open on the final image. IMPRESSION: 1. Balloon dilation of the descending duodenum. 2. Removal of plastic biliary stent and placement of a self expanding metal biliary stent. These images were submitted for radiologic interpretation only. Please see the procedural report for the amount of contrast and the fluoroscopy time utilized. Electronically Signed   By: Jacqulynn Cadet M.D.   On: 04/12/2020 15:59    Labs:  CBC: Recent Labs    04/10/20 0409 04/11/20 0556 04/12/20 0406 04/13/20 0418  WBC 10.2 10.5 10.0 9.5  HGB 10.3* 10.6* 10.5* 10.1*  HCT 32.2* 33.9* 33.3* 32.0*  PLT 221 247 238 225    COAGS: Recent Labs    09/04/19 0753 04/02/20 1308 04/07/20 1154 04/08/20 0330  INR 1.1 1.2 1.2 1.4*  APTT  --  39*  --   --     BMP: Recent Labs    12/10/19 1216 12/10/19 1216 01/19/20 1356 01/19/20 1356 02/27/20 1301 02/27/20 1301 03/12/20 1109 04/02/20 1042 04/07/20 0406 04/08/20 0330 04/09/20 0739 04/12/20 0406  NA 140   < > 140   < > 137   < > 138   < > 134* 135 141 139  K 4.8   < > 5.1   < > 5.1   < > 4.8   < > 4.1 4.3 4.2 4.2  CL 100   < > 105   < > 102   < > 103   < > 107 108 110 114*  CO2 29   < > 28   < > 28   < > 29   < > 18* 18* 19* 19*  GLUCOSE 114*   < > 131*   < > 199*   < > 251*   < > 99 160* 98 115*  BUN 42*   < > 48*   < > 45*   < > 49*   < > 50* 52* 40* 28*  CALCIUM 10.1   < > 9.7   < > 9.6   < > 9.9    < > 8.4* 8.2* 8.9 8.3*  CREATININE 1.72*   < > 1.56*   < > 1.73*   < > 1.61*   < > 1.61* 1.68* 1.68* 1.42*  GFRNONAA 26*   < > 29*   < > 26*   < > 28*   < > 31* 29* 29* 36*  GFRAA 30*  --  34*  --  30*  --  33*  --   --   --   --   --    < > =  values in this interval not displayed.    LIVER FUNCTION TESTS: Recent Labs    04/07/20 0406 04/08/20 0330 04/09/20 0739 04/12/20 0406  BILITOT 1.1 0.8 1.0 0.8  AST 18 17 17 17   ALT 26 24 20 17   ALKPHOS 167* 168* 158* 114  PROT 6.0* 5.7* 6.6 5.9*  ALBUMIN 2.2* 2.0* 2.3* 2.3*    Assessment and Plan:  86 y.o. female inpatient.  History of localized pancreatic cancer s/p pancreatic stent placement on 3.26.21 last exchanged on 11.1.21. Admitted to [facility] for fatigue, weakness found to have a fib with RVR. Hospital stay complicated by abdominal pain. MRI performed on 10.26.21 indicative for acute calculous cholecystitis. IR  Placed a cholecystomy tube on 10.28.21. Patient tolerating diet a this time. Positive RUQ cholecystomy tube. Site in unremarkable with no erythema,m edema, tenderness,  Bleeding or draining noted at the exit site. Suture and stat lock in place. Dressing is clean, dry and intact. Drain is able to be flushed easily. Per Epic output 20 ml, 25 ml. 10 ml of brown output noted to be in the gravity bag at the time of evaluation.  WBC is WNL. Patient is afebrile. Cultures from abscess show multiple organism present with non being predominant.   Per note from [facility] patient to follow up with Dr. Harlow Asa in 4 weeks.   Cholecystomy tube drain will need to remain in place at least 4-6 weeks unless gallbladder surgically removed in interim; continue tid drain flushes in house and once daily with 5 ml sterile normal saline as outpatient; will schedule f/u cholangiogram in 4-6 weeks. Orders placed for patient to follow up as an outpatient. in IR drain clinic. IR schedulers will call with appointment date and time.      Electronically  Signed: Jacqualine Mau, NP 04/13/2020, 11:17 AM   I spent a total of 15 Minutes at the patient's bedside AND on the patient's hospital floor or unit, greater than 50% of which was counseling/coordinating care for cholecystomy tube placement.

## 2020-04-14 ENCOUNTER — Other Ambulatory Visit: Payer: Self-pay | Admitting: Surgery

## 2020-04-14 ENCOUNTER — Encounter (HOSPITAL_COMMUNITY): Payer: Self-pay | Admitting: Gastroenterology

## 2020-04-14 DIAGNOSIS — K8050 Calculus of bile duct without cholangitis or cholecystitis without obstruction: Secondary | ICD-10-CM

## 2020-04-23 ENCOUNTER — Other Ambulatory Visit: Payer: Self-pay | Admitting: *Deleted

## 2020-04-23 ENCOUNTER — Encounter: Payer: Self-pay | Admitting: *Deleted

## 2020-04-23 DIAGNOSIS — K858 Other acute pancreatitis without necrosis or infection: Secondary | ICD-10-CM

## 2020-04-23 DIAGNOSIS — I4891 Unspecified atrial fibrillation: Secondary | ICD-10-CM

## 2020-04-23 DIAGNOSIS — R399 Unspecified symptoms and signs involving the genitourinary system: Secondary | ICD-10-CM

## 2020-04-23 DIAGNOSIS — C670 Malignant neoplasm of trigone of bladder: Secondary | ICD-10-CM

## 2020-04-23 DIAGNOSIS — C259 Malignant neoplasm of pancreas, unspecified: Secondary | ICD-10-CM

## 2020-04-23 NOTE — Progress Notes (Signed)
Spoke with patient today. She has been discharged from skilled nursing care and is back home. She is confused at her upcoming appointments and wants dates and times confirmed as well as the purpose for these appointments. Answered all her questions to her satisfaction.  Oncology Nurse Navigator Documentation  Oncology Nurse Navigator Flowsheets 04/23/2020  Abnormal Finding Date -  Diagnosis Status -  Planned Course of Treatment -  Phase of Treatment -  Radiation Actual Start Date: -  Radiation Expected End Date: -  Radiation Actual End Date: -  Navigator Follow Up Date: 04/26/2020  Navigator Follow Up Reason: Follow-up Appointment  Navigation Complete Date: -  Navigator Location [facility]  Referral Date to RadOnc/MedOnc -  Navigator Encounter Type Telephone  Telephone Appt Confirmation/Clarification;Incoming Call  Patient Visit Type MedOnc  Treatment Phase Post-Tx Follow-up  Barriers/Navigation Needs Anxiety;Coordination of Care  Education -  Interventions Coordination of Care;Psycho-Social Support  Acuity Level 2-Minimal Needs (1-2 Barriers Identified)  Referrals -  Coordination of Care Other  Education Method -  Support Groups/Services Friends and Family  Time Spent with Patient 15

## 2020-04-26 ENCOUNTER — Other Ambulatory Visit: Payer: Self-pay

## 2020-04-26 ENCOUNTER — Inpatient Hospital Stay: Payer: Medicare Other | Attending: Hematology & Oncology

## 2020-04-26 ENCOUNTER — Inpatient Hospital Stay (HOSPITAL_BASED_OUTPATIENT_CLINIC_OR_DEPARTMENT_OTHER): Payer: Medicare Other | Admitting: Hematology & Oncology

## 2020-04-26 VITALS — BP 132/57 | HR 80 | Temp 98.1°F | Resp 19 | Wt 98.0 lb

## 2020-04-26 DIAGNOSIS — I4891 Unspecified atrial fibrillation: Secondary | ICD-10-CM

## 2020-04-26 DIAGNOSIS — K8000 Calculus of gallbladder with acute cholecystitis without obstruction: Secondary | ICD-10-CM | POA: Diagnosis not present

## 2020-04-26 DIAGNOSIS — K858 Other acute pancreatitis without necrosis or infection: Secondary | ICD-10-CM

## 2020-04-26 DIAGNOSIS — R399 Unspecified symptoms and signs involving the genitourinary system: Secondary | ICD-10-CM

## 2020-04-26 DIAGNOSIS — C259 Malignant neoplasm of pancreas, unspecified: Secondary | ICD-10-CM | POA: Diagnosis present

## 2020-04-26 DIAGNOSIS — C670 Malignant neoplasm of trigone of bladder: Secondary | ICD-10-CM

## 2020-04-26 LAB — CBC WITH DIFFERENTIAL (CANCER CENTER ONLY)
Abs Immature Granulocytes: 0.04 10*3/uL (ref 0.00–0.07)
Basophils Absolute: 0.1 10*3/uL (ref 0.0–0.1)
Basophils Relative: 1 %
Eosinophils Absolute: 0.2 10*3/uL (ref 0.0–0.5)
Eosinophils Relative: 2 %
HCT: 33.6 % — ABNORMAL LOW (ref 36.0–46.0)
Hemoglobin: 10.6 g/dL — ABNORMAL LOW (ref 12.0–15.0)
Immature Granulocytes: 1 %
Lymphocytes Relative: 8 %
Lymphs Abs: 0.6 10*3/uL — ABNORMAL LOW (ref 0.7–4.0)
MCH: 31.6 pg (ref 26.0–34.0)
MCHC: 31.5 g/dL (ref 30.0–36.0)
MCV: 100.3 fL — ABNORMAL HIGH (ref 80.0–100.0)
Monocytes Absolute: 0.8 10*3/uL (ref 0.1–1.0)
Monocytes Relative: 11 %
Neutro Abs: 5.7 10*3/uL (ref 1.7–7.7)
Neutrophils Relative %: 77 %
Platelet Count: 176 10*3/uL (ref 150–400)
RBC: 3.35 MIL/uL — ABNORMAL LOW (ref 3.87–5.11)
RDW: 13.9 % (ref 11.5–15.5)
WBC Count: 7.4 10*3/uL (ref 4.0–10.5)
nRBC: 0 % (ref 0.0–0.2)

## 2020-04-26 LAB — CMP (CANCER CENTER ONLY)
ALT: 20 U/L (ref 0–44)
AST: 25 U/L (ref 15–41)
Albumin: 3.5 g/dL (ref 3.5–5.0)
Alkaline Phosphatase: 103 U/L (ref 38–126)
Anion gap: 6 (ref 5–15)
BUN: 30 mg/dL — ABNORMAL HIGH (ref 8–23)
CO2: 29 mmol/L (ref 22–32)
Calcium: 9.7 mg/dL (ref 8.9–10.3)
Chloride: 99 mmol/L (ref 98–111)
Creatinine: 1.45 mg/dL — ABNORMAL HIGH (ref 0.44–1.00)
GFR, Estimated: 35 mL/min — ABNORMAL LOW (ref >60)
Glucose, Bld: 186 mg/dL — ABNORMAL HIGH (ref 70–99)
Potassium: 5.4 mmol/L — ABNORMAL HIGH (ref 3.5–5.1)
Sodium: 134 mmol/L — ABNORMAL LOW (ref 135–145)
Total Bilirubin: 0.6 mg/dL (ref 0.3–1.2)
Total Protein: 7.5 g/dL (ref 6.5–8.1)

## 2020-04-26 LAB — LACTATE DEHYDROGENASE: LDH: 115 U/L (ref 98–192)

## 2020-04-26 NOTE — Progress Notes (Signed)
Hematology and Oncology Follow Up Visit  Glenda King [id number] 12-09-1928 86 y.o. 04/26/2020   Principle Diagnosis:  Pancreatic cancer-localized  Current Therapy:        Status post radiosurgery-completed on 01/29/2020.     Interim History:  Glenda King is here today for follow-up.  She actually was in the hospital recently.  She had acute cholecystitis.  She was not felt to be a candidate for surgery.  I am not sure exactly why not.  I think she may have developed an ileus.  She had a cholecystotomy tube placed.  This is been followed as an outpatient by a home health nurse.  There has been no drainage from the cholecystotomy tube.  She has had no abdominal pain.  She would like to have the tube taken out.  Her weight is down.  She is starting to eat more.  Her albumin is gone up nicely.  Hopefully this is a sign that she will start to gain weight.  We might be little troublesome was that when she was in the hospital, her CA 19-9 was up to 170.  Not sure exactly what this indicates.  Some of this might have been secondary to her cholecystitis.  We will see what her level is today.  Another she has had scans that really did not show any evidence of progressive disease.  She actually responded to stereotactic radiosurgery.  She is not having any abdominal pain.  She really would like to have the cholecystotomy tube out.  There is no drainage from this.  She has had no problems with bowels or bladder.  She has had no cough or shortness of breath.  There has been no leg swelling.  She does walk.  She is trying to walk for her exercise.  Currently, I would say performance status by ECOG 2.   Medications:  Allergies as of 04/26/2020      Reactions   Gadolinium Derivatives Other (See Comments)   Secondary to her stage IV kidney disease   Amoxicillin Other (See Comments)   UNKNOWN REACTION   Pollen Extract Other (See Comments)   Sneezing      Medication List        Accurate as of April 26, 2020  3:10 PM. If you have any questions, ask your nurse or doctor.        cholecalciferol 25 MCG (1000 UNIT) tablet Commonly known as: VITAMIN D3 Take 1,000 Units by mouth 2 (two) times daily.   cyanocobalamin 1000 MCG tablet Take 1 tablet (1,000 mcg total) by mouth daily.   lactose free nutrition Liqd Take 237 mLs by mouth 2 (two) times daily between meals.   levETIRAcetam 500 MG tablet Commonly known as: KEPPRA Take 500 mg by mouth 2 (two) times daily.   levothyroxine 100 MCG tablet Commonly known as: SYNTHROID Take 100 mcg by mouth daily.   Melatonin 10 MG Caps Take 10 mg by mouth at bedtime.   metoprolol tartrate 25 MG tablet Commonly known as: LOPRESSOR Take 1 tablet (25 mg total) by mouth 2 (two) times daily.   pantoprazole 40 MG tablet Commonly known as: PROTONIX Take 1 tablet (40 mg total) by mouth daily.   sodium bicarbonate 650 MG tablet Take 650 mg by mouth 2 (two) times daily.       Allergies:  Allergies  Allergen Reactions  . Gadolinium Derivatives Other (See Comments)    Secondary to her stage IV kidney disease   . Amoxicillin Other (  See Comments)    UNKNOWN REACTION     . Pollen Extract Other (See Comments)    Sneezing    Past Medical History, Surgical history, Social history, and Family History were reviewed and updated.  Review of Systems: Review of Systems  Constitutional: Positive for malaise/fatigue and weight loss.  HENT: Negative.   Eyes: Negative.   Respiratory: Negative.   Cardiovascular: Negative.   Gastrointestinal: Negative.   Genitourinary: Negative.   Musculoskeletal: Negative.   Skin: Negative.   Neurological: Negative.   Endo/Heme/Allergies: Negative.   Psychiatric/Behavioral: Negative.      Physical Exam:  weight is 98 lb (44.5 kg). Her oral temperature is 98.1 F (36.7 C). Her blood pressure is 132/57 (abnormal) and her pulse is 80. Her respiration is 19 and oxygen saturation is  97%.   Wt Readings from Last 3 Encounters:  04/26/20 98 lb (44.5 kg)  04/12/20 97 lb 3.6 oz (44.1 kg)  04/02/20 96 lb (43.5 kg)    Physical Exam Vitals reviewed.  Constitutional:      Comments: Glenda King is a thin white female.  She is not jaundiced.  She has symmetric muscle atrophy in upper and lower extremities.  She has the cholecystostomy tube in the right upper quadrant.  There is no drainage in the bag.  There is no erythema or warmth at the entry site.  HENT:     Head: Normocephalic and atraumatic.  Eyes:     Pupils: Pupils are equal, round, and reactive to light.  Cardiovascular:     Rate and Rhythm: Normal rate and regular rhythm.     Heart sounds: Normal heart sounds.  Pulmonary:     Effort: Pulmonary effort is normal.     Breath sounds: Normal breath sounds.  Abdominal:     General: Bowel sounds are normal.     Palpations: Abdomen is soft.  Musculoskeletal:        General: No tenderness or deformity. Normal range of motion.     Cervical back: Normal range of motion.  Lymphadenopathy:     Cervical: No cervical adenopathy.  Skin:    General: Skin is warm and dry.     Findings: No erythema or rash.  Neurological:     Mental Status: She is alert and oriented to person, place, and time.  Psychiatric:        Behavior: Behavior normal.        Thought Content: Thought content normal.        Judgment: Judgment normal.      Lab Results  Component Value Date   WBC 7.4 04/26/2020   HGB 10.6 (L) 04/26/2020   HCT 33.6 (L) 04/26/2020   MCV 100.3 (H) 04/26/2020   PLT 176 04/26/2020   Lab Results  Component Value Date   FERRITIN 196 04/03/2020   IRON 15 (L) 04/03/2020   TIBC 221 (L) 04/03/2020   UIBC 206 04/03/2020   IRONPCTSAT 7 (L) 04/03/2020   Lab Results  Component Value Date   RETICCTPCT 1.1 04/03/2020   RBC 3.35 (L) 04/26/2020   No results found for: KPAFRELGTCHN, LAMBDASER, KAPLAMBRATIO No results found for: IGGSERUM, IGA, IGMSERUM No results  found for: TOTALPROTELP, ALBUMINELP, A1GS, A2GS, BETS, BETA2SER, GAMS, MSPIKE, SPEI   Chemistry      Component Value Date/Time   NA 134 (L) 04/26/2020 1351   K 5.4 (H) 04/26/2020 1351   CL 99 04/26/2020 1351   CO2 29 04/26/2020 1351   BUN 30 (H) 04/26/2020 1351  CREATININE 1.45 (H) 04/26/2020 1351      Component Value Date/Time   CALCIUM 9.7 04/26/2020 1351   ALKPHOS 103 04/26/2020 1351   AST 25 04/26/2020 1351   ALT 20 04/26/2020 1351   BILITOT 0.6 04/26/2020 1351       Impression and Plan: Ms. Shelvin is a very charming 86 yo white female with history of pancreatic cancer.  She was treated with stereotactic radiosurgery.  She was recently in the hospital for cholecystitis.  I think she had atrial fibrillation.  Of note, the drainage from the bile did not show any predominant organism.  It certainly does sound like she is back in sinus rhythm right now.  Her cardiac exam showed regular rate and rhythm.  We will see if interventional radiology will take out the cholecystotomy tube.  We will see what the CA 19-9 is.  This can be incredibly important for [place].  Hopefully, it will be a lot lower.  If not, I think we have trouble.  I would like to see her back in a few weeks.  I would like to see how she is feeling.  Again I does want to make sure that her quality of life is our top priority.    [name], MD 11/15/20213:10 PM

## 2020-04-27 ENCOUNTER — Encounter: Payer: Self-pay | Admitting: *Deleted

## 2020-04-27 LAB — IRON AND TIBC
Iron: 53 ug/dL (ref 41–142)
Saturation Ratios: 26 % (ref 21–57)
TIBC: 203 ug/dL — ABNORMAL LOW (ref 236–444)
UIBC: 150 ug/dL (ref 120–384)

## 2020-04-27 LAB — CANCER ANTIGEN 19-9: CA 19-9: 145 U/mL — ABNORMAL HIGH (ref 0–35)

## 2020-04-27 LAB — FERRITIN: Ferritin: 822 ng/mL — ABNORMAL HIGH (ref 11–307)

## 2020-04-27 NOTE — Progress Notes (Signed)
Scheduled patient's follow up appointments. Called patient and reviewed these with her.  Oncology Nurse Navigator Documentation  Oncology Nurse Navigator Flowsheets 04/27/2020  Abnormal Finding Date -  Diagnosis Status -  Planned Course of Treatment -  Phase of Treatment -  Radiation Actual Start Date: -  Radiation Expected End Date: -  Radiation Actual End Date: -  Navigator Follow Up Date: 05/24/2020  Navigator Follow Up Reason: Follow-up Appointment  Navigation Complete Date: -  Navigator Location [facility]  Referral Date to RadOnc/MedOnc -  Navigator Encounter Type Appt/Treatment Plan Review;Telephone  Telephone Appt Confirmation/Clarification;Outgoing Call  Patient Visit Type MedOnc  Treatment Phase Post-Tx Follow-up  Barriers/Navigation Needs Coordination of Care  Education -  Interventions Coordination of Care;Psycho-Social Support  Acuity Level 2-Minimal Needs (1-2 Barriers Identified)  Referrals -  Coordination of Care Appts  Education Method -  Support Groups/Services Friends and Family  Time Spent with Patient 30

## 2020-04-29 ENCOUNTER — Ambulatory Visit
Admission: RE | Admit: 2020-04-29 | Discharge: 2020-04-29 | Disposition: A | Discharge status: Home / Self Care | Payer: Medicare Other | Source: Ambulatory Visit | Attending: Surgery | Admitting: Surgery

## 2020-04-29 ENCOUNTER — Ambulatory Visit
Admission: RE | Admit: 2020-04-29 | Discharge: 2020-04-29 | Disposition: A | Discharge status: Home / Self Care | Payer: Medicare Other | Source: Ambulatory Visit | Attending: Radiology | Admitting: Radiology

## 2020-04-29 DIAGNOSIS — K8050 Calculus of bile duct without cholangitis or cholecystitis without obstruction: Secondary | ICD-10-CM

## 2020-04-29 HISTORY — PX: IR RADIOLOGIST EVAL & MGMT: IMG5224

## 2020-04-29 IMAGING — RF DG SINUS / FISTULA TRACT / ABSCESSOGRAM
3 series · 8 of 8 positions shown · non-contrast
Comparison: none

INDICATION: 89-year-old female presents for drain injection percutaneous
cholecystostomy for acute calculus cholecystitis

[Series 1: one shot · 0.15mm/px · 1 of 1 slices shown (1 of 2)]
[im 1/1]
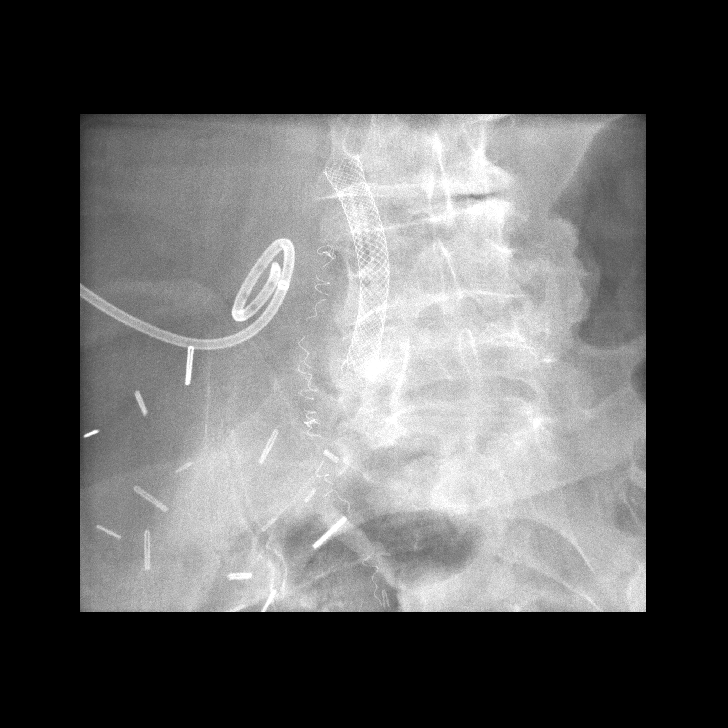

[Series 2: sequence · 4 of 20 frames shown]
[frame 2/20]
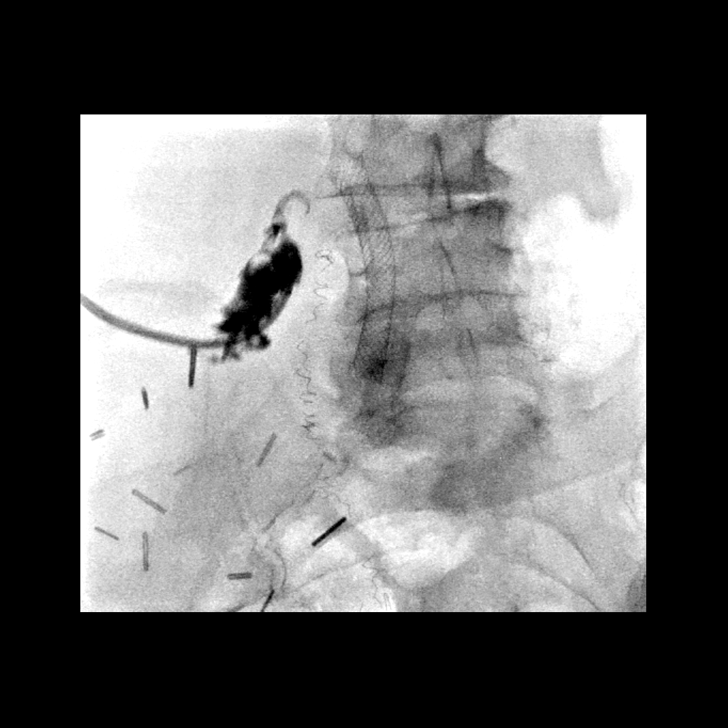
[frame 4/20]
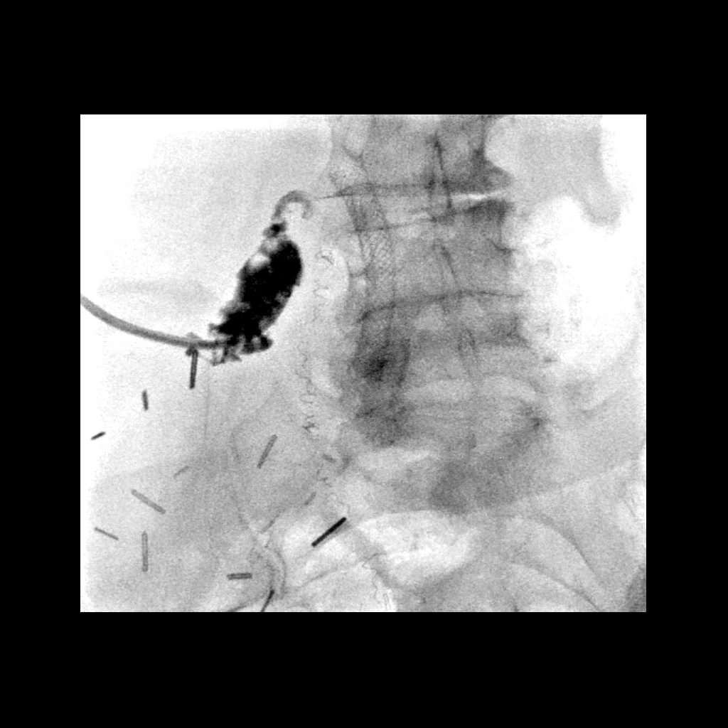
[frame 11/20]
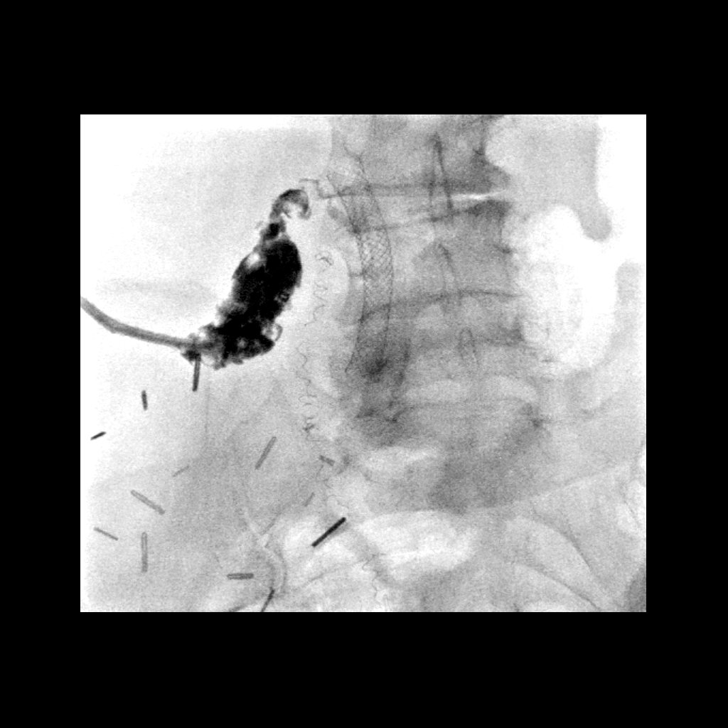
[frame 18/20]
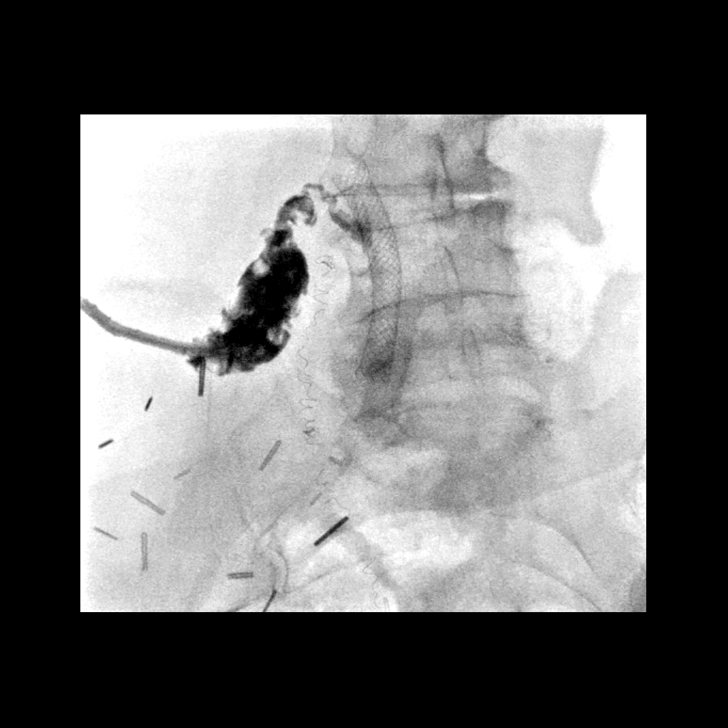

[Series 3: one shot · 0.15mm/px · 3 of 3 slices shown (2 of 2)]
[im 1/3]
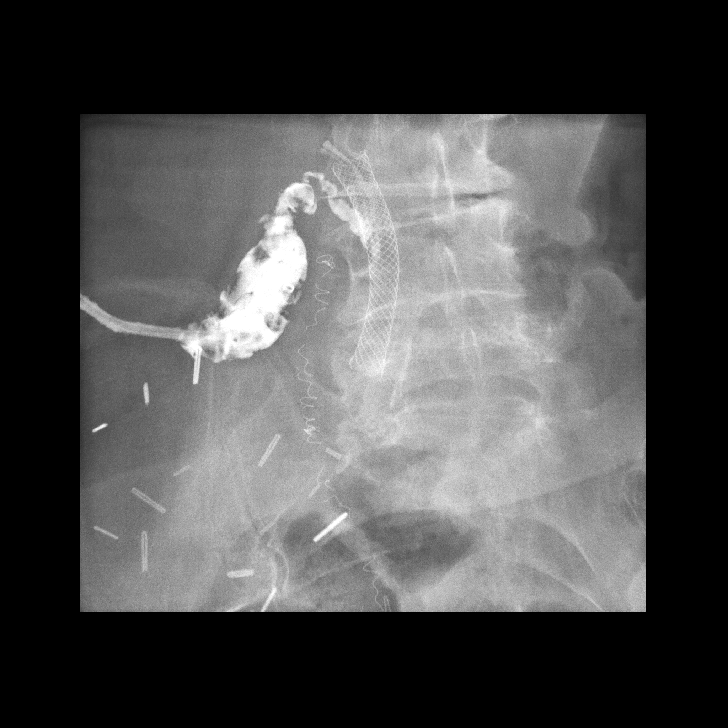
[im 2/3]
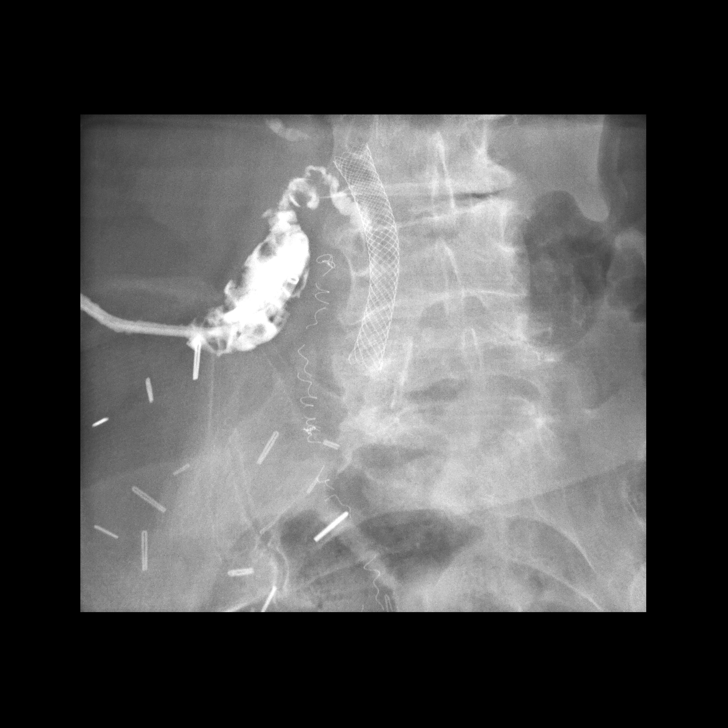
[im 3/3]
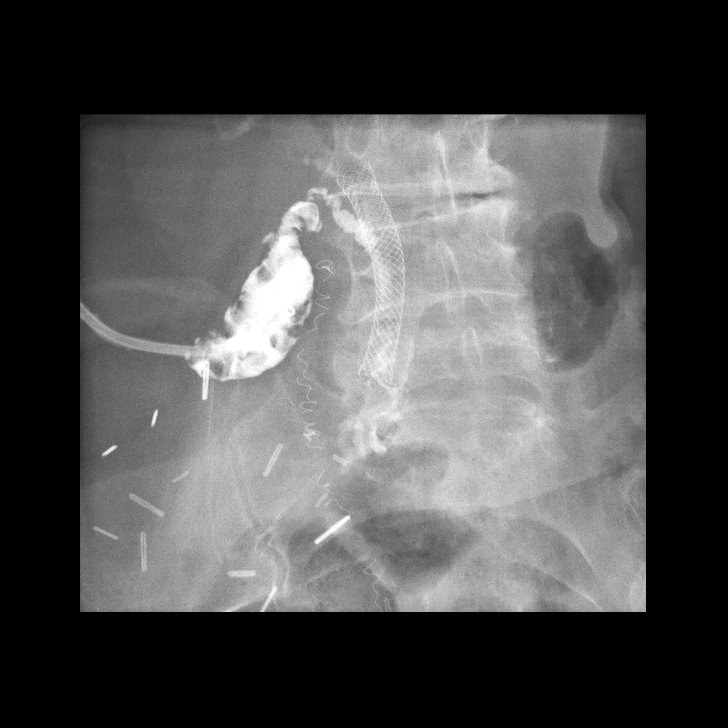

[8 of 8 positions shown; findings below may reference images not displayed]

Placement was performed [DATE]. She has additional history of
pancreatic cancer with common bile duct stenting performed
[DATE]

EXAM:
DRAIN INJECTION

MEDICATIONS:
None

ANESTHESIA/SEDATION:
None

COMPLICATIONS:
None

PROCEDURE:
Informed written consent was obtained from the patient after a
thorough discussion of the procedural risks, benefits and
alternatives. All questions were addressed. Maximal Sterile Barrier
Technique was utilized. A timeout was performed prior to the
initiation of the procedure.

Scout images were acquired.

Contrast was then injected under fluoroscopic observation through
the percutaneous cholecystostomy.

Images were stored.

Catheter was then attached to gravity drainage.
FINDINGS: Drain injection demonstrates multiple filling defects within the
gallbladder compatible with debris/cholelithiasis. Contrast
traversed the cystic duct and reached the common bile duct and the
common bile duct stent. Contrast was observed to enter the duodenum.
IMPRESSION: Drain injection confirms adequate location of the percutaneous
cholecystostomy, with retained cholelithiasis/sludge.

## 2020-04-29 NOTE — Progress Notes (Signed)
Interventional Radiology Progress Note  HPI: 86 yo female presents for perc chole drain injection.   History of localized pancreatic cancer s/p pancreatic stent placement on 3.26.21 last exchanged on 11.1.21. Admitted to [facility] recently for fatigue, weakness found to have a fib with RVR. Hospital stay complicated by abdominal pain and acute cholecystitis. cholecystomy tube on 10.28.21.  She tells me that she does not yet know when her surgical follow up is.   She reports very little drain output, with small volume bile in the bag.   She denies any fever rigors chills abdominal pain.   Drain injection demonstrates cholelithiasis/sludge. High risk for recurrent cholecystitis if removed.   Recommend follow up on schedule with surgery.  If not a candidate for interval cholecystectomy, then routine drain exchange can be performed as needed every 2-3 months starting six weeks after placement.   Signed,  Dulcy Fanny. Earleen Newport, DO

## 2020-05-05 ENCOUNTER — Other Ambulatory Visit: Payer: Self-pay | Admitting: Hematology & Oncology

## 2020-05-18 ENCOUNTER — Other Ambulatory Visit: Payer: Medicare Other

## 2020-05-24 ENCOUNTER — Telehealth: Payer: Self-pay | Admitting: Hematology & Oncology

## 2020-05-24 ENCOUNTER — Other Ambulatory Visit: Payer: Self-pay

## 2020-05-24 ENCOUNTER — Inpatient Hospital Stay (HOSPITAL_BASED_OUTPATIENT_CLINIC_OR_DEPARTMENT_OTHER): Payer: Medicare Other | Admitting: Hematology & Oncology

## 2020-05-24 ENCOUNTER — Encounter: Payer: Self-pay | Admitting: Hematology & Oncology

## 2020-05-24 ENCOUNTER — Inpatient Hospital Stay: Payer: Medicare Other | Attending: Hematology & Oncology

## 2020-05-24 VITALS — BP 130/63 | HR 76 | Temp 97.6°F | Resp 18 | Wt 94.8 lb

## 2020-05-24 DIAGNOSIS — C259 Malignant neoplasm of pancreas, unspecified: Secondary | ICD-10-CM | POA: Insufficient documentation

## 2020-05-24 DIAGNOSIS — K8000 Calculus of gallbladder with acute cholecystitis without obstruction: Secondary | ICD-10-CM

## 2020-05-24 DIAGNOSIS — Z936 Other artificial openings of urinary tract status: Secondary | ICD-10-CM | POA: Diagnosis not present

## 2020-05-24 LAB — CBC WITH DIFFERENTIAL (CANCER CENTER ONLY)
Abs Immature Granulocytes: 0.03 10*3/uL (ref 0.00–0.07)
Basophils Absolute: 0 10*3/uL (ref 0.0–0.1)
Basophils Relative: 1 %
Eosinophils Absolute: 0.1 10*3/uL (ref 0.0–0.5)
Eosinophils Relative: 2 %
HCT: 32.7 % — ABNORMAL LOW (ref 36.0–46.0)
Hemoglobin: 10.3 g/dL — ABNORMAL LOW (ref 12.0–15.0)
Immature Granulocytes: 1 %
Lymphocytes Relative: 9 %
Lymphs Abs: 0.5 10*3/uL — ABNORMAL LOW (ref 0.7–4.0)
MCH: 32.6 pg (ref 26.0–34.0)
MCHC: 31.5 g/dL (ref 30.0–36.0)
MCV: 103.5 fL — ABNORMAL HIGH (ref 80.0–100.0)
Monocytes Absolute: 0.5 10*3/uL (ref 0.1–1.0)
Monocytes Relative: 8 %
Neutro Abs: 4.8 10*3/uL (ref 1.7–7.7)
Neutrophils Relative %: 79 %
Platelet Count: 168 10*3/uL (ref 150–400)
RBC: 3.16 MIL/uL — ABNORMAL LOW (ref 3.87–5.11)
RDW: 14.2 % (ref 11.5–15.5)
WBC Count: 6.1 10*3/uL (ref 4.0–10.5)
nRBC: 0 % (ref 0.0–0.2)

## 2020-05-24 LAB — CMP (CANCER CENTER ONLY)
ALT: 26 U/L (ref 0–44)
AST: 24 U/L (ref 15–41)
Albumin: 3.6 g/dL (ref 3.5–5.0)
Alkaline Phosphatase: 121 U/L (ref 38–126)
Anion gap: 6 (ref 5–15)
BUN: 37 mg/dL — ABNORMAL HIGH (ref 8–23)
CO2: 30 mmol/L (ref 22–32)
Calcium: 9.8 mg/dL (ref 8.9–10.3)
Chloride: 103 mmol/L (ref 98–111)
Creatinine: 1.57 mg/dL — ABNORMAL HIGH (ref 0.44–1.00)
GFR, Estimated: 31 mL/min — ABNORMAL LOW (ref >60)
Glucose, Bld: 252 mg/dL — ABNORMAL HIGH (ref 70–99)
Potassium: 5.2 mmol/L — ABNORMAL HIGH (ref 3.5–5.1)
Sodium: 139 mmol/L (ref 135–145)
Total Bilirubin: 0.4 mg/dL (ref 0.3–1.2)
Total Protein: 7.8 g/dL (ref 6.5–8.1)

## 2020-05-24 LAB — PREALBUMIN: Prealbumin: 21.3 mg/dL (ref 18–38)

## 2020-05-24 NOTE — Telephone Encounter (Signed)
Appointments scheduled calendar printed & mailed per 12/13 los 

## 2020-05-24 NOTE — Progress Notes (Signed)
Hematology and Oncology Follow Up Visit  ANGELIC SCHNELLE [id number] May 25, 1929 86 y.o. 05/24/2020   Principle Diagnosis:  Pancreatic cancer-localized  Current Therapy:        Status post radiosurgery-completed on 01/29/2020.     Interim History:  Ms. Schrupp is here today for follow-up.  The problem is still the cholecystotomy tube.  She still has this in place.  It is not draining.  She went to see surgery.  I said that she has to keep this in.  I am not sure as to why this has to be the case.  Again the tumor really is not draining.  I would have to believe that the bile was draining through her intestines.  I also am not sure why she cannot have her gallbladder taken out.  I realize that she is 86 years old.  I realize that she weighs 94 pounds.  I realize that she has pancreatic cancer.  However, this is clearly a quality-of-life issue.  She really just has very little quality of life because of the cholecystotomy tube.  It really is causing a lot of aggravation.  As far as her cancer is concerned, we will go ahead and get another MRI of her abdomen.  This time we will try to get contrast to see if we can see this tumor.  Her last CA 19-9 was 145.  Her appetite is okay.  She is having no nausea or vomiting.  There is no problems with bowels or bladder.  She has had no issues with diarrhea.  There is no leg swelling.  Overall, I would say performance status is ECOG 2.   Medications:  Allergies as of 05/24/2020      Reactions   Gadolinium Derivatives Other (See Comments)   Secondary to her stage IV kidney disease   Amoxicillin Other (See Comments)   UNKNOWN REACTION   Pollen Extract Other (See Comments)   Sneezing      Medication List       Accurate as of May 24, 2020  2:46 PM. If you have any questions, ask your nurse or doctor.        cholecalciferol 25 MCG (1000 UNIT) tablet Commonly known as: VITAMIN D3 Take 1,000 Units by mouth 2 (two) times daily.    cyanocobalamin 1000 MCG tablet Take 1 tablet (1,000 mcg total) by mouth daily.   levETIRAcetam 500 MG tablet Commonly known as: KEPPRA Take 500 mg by mouth 2 (two) times daily.   levothyroxine 100 MCG tablet Commonly known as: SYNTHROID Take 100 mcg by mouth daily.   Melatonin 10 MG Caps Take 10 mg by mouth at bedtime.   metoprolol tartrate 25 MG tablet Commonly known as: LOPRESSOR Take 1 tablet (25 mg total) by mouth 2 (two) times daily.   pantoprazole 40 MG tablet Commonly known as: PROTONIX Take 1 tablet (40 mg total) by mouth daily.   sodium bicarbonate 650 MG tablet Take 650 mg by mouth 2 (two) times daily.       Allergies:  Allergies  Allergen Reactions  . Gadolinium Derivatives Other (See Comments)    Secondary to her stage IV kidney disease   . Amoxicillin Other (See Comments)    UNKNOWN REACTION     . Pollen Extract Other (See Comments)    Sneezing    Past Medical History, Surgical history, Social history, and Family History were reviewed and updated.  Review of Systems: Review of Systems  Constitutional: Positive for malaise/fatigue and weight  loss.  HENT: Negative.   Eyes: Negative.   Respiratory: Negative.   Cardiovascular: Negative.   Gastrointestinal: Negative.   Genitourinary: Negative.   Musculoskeletal: Negative.   Skin: Negative.   Neurological: Negative.   Endo/Heme/Allergies: Negative.   Psychiatric/Behavioral: Negative.      Physical Exam:  weight is 94 lb 12 oz (43 kg). Her oral temperature is 97.6 F (36.4 C). Her blood pressure is 130/63 and her pulse is 76. Her respiration is 18 and oxygen saturation is 97%.   Wt Readings from Last 3 Encounters:  05/24/20 94 lb 12 oz (43 kg)  04/26/20 98 lb (44.5 kg)  04/12/20 97 lb 3.6 oz (44.1 kg)    Physical Exam Vitals reviewed.  Constitutional:      Comments: Ms. Buehrle is a thin white female.  She is not jaundiced.  She has symmetric muscle atrophy in upper and lower  extremities.  She has the cholecystostomy tube in the right upper quadrant.  There is no drainage in the bag.  There is no erythema or warmth at the entry site.  HENT:     Head: Normocephalic and atraumatic.  Eyes:     Pupils: Pupils are equal, round, and reactive to light.  Cardiovascular:     Rate and Rhythm: Normal rate and regular rhythm.     Heart sounds: Normal heart sounds.  Pulmonary:     Effort: Pulmonary effort is normal.     Breath sounds: Normal breath sounds.  Abdominal:     General: Bowel sounds are normal.     Palpations: Abdomen is soft.  Musculoskeletal:        General: No tenderness or deformity. Normal range of motion.     Cervical back: Normal range of motion.  Lymphadenopathy:     Cervical: No cervical adenopathy.  Skin:    General: Skin is warm and dry.     Findings: No erythema or rash.  Neurological:     Mental Status: She is alert and oriented to person, place, and time.  Psychiatric:        Behavior: Behavior normal.        Thought Content: Thought content normal.        Judgment: Judgment normal.      Lab Results  Component Value Date   WBC 6.1 05/24/2020   HGB 10.3 (L) 05/24/2020   HCT 32.7 (L) 05/24/2020   MCV 103.5 (H) 05/24/2020   PLT 168 05/24/2020   Lab Results  Component Value Date   FERRITIN 822 (H) 04/26/2020   IRON 53 04/26/2020   TIBC 203 (L) 04/26/2020   UIBC 150 04/26/2020   IRONPCTSAT 26 04/26/2020   Lab Results  Component Value Date   RETICCTPCT 1.1 04/03/2020   RBC 3.16 (L) 05/24/2020   No results found for: KPAFRELGTCHN, LAMBDASER, KAPLAMBRATIO No results found for: IGGSERUM, IGA, IGMSERUM No results found for: Ronnald Ramp, A1GS, A2GS, Violet Baldy, MSPIKE, SPEI   Chemistry      Component Value Date/Time   NA 139 05/24/2020 1306   K 5.2 (H) 05/24/2020 1306   CL 103 05/24/2020 1306   CO2 30 05/24/2020 1306   BUN 37 (H) 05/24/2020 1306   CREATININE 1.57 (H) 05/24/2020 1306      Component  Value Date/Time   CALCIUM 9.8 05/24/2020 1306   ALKPHOS 121 05/24/2020 1306   AST 24 05/24/2020 1306   ALT 26 05/24/2020 1306   BILITOT 0.4 05/24/2020 1306  Impression and Plan: Ms. Coryell is a very charming 86 yo white female with history of pancreatic cancer.  She was treated with stereotactic radiosurgery.  It has been 3 months since she has had the radiosurgery.  We will see how everything looks with respect to response.  Hopefully, she has had a response.  The main problem is this cholecystotomy tube.  I looked at it.  There is a lot of crusting on it.  Looks like it might be leaking a little bit.  I will have to speak with interventional radiology and see how they might be able to help out with this cholecystotomy tube.  I will have to speak with surgery to see if they can possibly think about a cholecystectomy on her.  Again, she does not have metastatic disease.  I just hope that she can have a cholecystotomy.  I really think this would help her quality of life.  I will plan to get her back in 4 weeks, if not sooner.    [name], MD 12/13/20212:46 PM

## 2020-05-25 LAB — CANCER ANTIGEN 19-9: CA 19-9: 179 U/mL — ABNORMAL HIGH (ref 0–35)

## 2020-05-26 ENCOUNTER — Other Ambulatory Visit: Payer: Self-pay | Admitting: Family

## 2020-05-26 ENCOUNTER — Encounter: Payer: Self-pay | Admitting: *Deleted

## 2020-05-26 ENCOUNTER — Telehealth: Payer: Self-pay | Admitting: *Deleted

## 2020-05-26 NOTE — Progress Notes (Signed)
Patient called with questions and concerns about an MRI that is scheduled for this weekend. She states shes been called from [facility] and [facility] both giving her appointment times for MRI.   I reviewed her schedule with her, and explained that the Friday appointment at [facility] was for tube exchange, while the Saturday appointment at [facility] was for her MRI.   I also reviewed with her, her listed contrast allergy and the need for premeds. She stated that she was not allergic to contrast, but that she was told that she should never have contrast due to her kidney function. She asked if her kidney doctor was okay with contrast. I collected her MD name and contact info.  Called and spoke to [place] at Dr Janann August office [phone]. She stated that the patient's creatinine clearance was only 16 and that she should not have contrast per their order. Reviewed this with patient. She decline contrast as she doesn't want to further injure her kidneys.   Reviewed this information with Dr Marin Olp. We will proceed with MRI without contrast  Oncology Nurse Navigator Documentation  Oncology Nurse Navigator Flowsheets 05/26/2020  Abnormal Finding Date -  Diagnosis Status -  Planned Course of Treatment -  Phase of Treatment -  Radiation Actual Start Date: -  Radiation Expected End Date: -  Radiation Actual End Date: -  Navigator Follow Up Date: 06/21/2020  Navigator Follow Up Reason: Follow-up Appointment  Navigation Complete Date: -  Navigator Location [facility]  Referral Date to RadOnc/MedOnc -  Navigator Encounter Type Telephone  Telephone Incoming Call;Appt Confirmation/Clarification  Patient Visit Type MedOnc  Treatment Phase Post-Tx Follow-up  Barriers/Navigation Needs Coordination of Care  Education -  Interventions Coordination of Care;Education;Psycho-Social Support  Acuity Level 2-Minimal Needs (1-2 Barriers Identified)  Referrals -  Coordination of Care Other  Education Method Verbal  Support  Groups/Services Friends and Family  Time Spent with Patient 78

## 2020-05-26 NOTE — Addendum Note (Signed)
Addended by: Burney Gauze R on: 05/26/2020 01:00 PM   Modules accepted: Orders

## 2020-05-26 NOTE — Telephone Encounter (Signed)
Patient notified that Dr. Marin Olp would like for her to have IV contrast with her MRI scheduled for 05/29/20 and that pre meds will be needed d/t pt.'s allergy to IV contrast.  Pt states that she has never had an allergic reaction to IV contrast, but is to not take IV contrast d/t her kidney function.  Informed pt that I would speak with Dr. Marin Olp and call her back with further orders.

## 2020-05-28 ENCOUNTER — Other Ambulatory Visit: Payer: Self-pay

## 2020-05-28 ENCOUNTER — Ambulatory Visit (HOSPITAL_BASED_OUTPATIENT_CLINIC_OR_DEPARTMENT_OTHER)
Admission: RE | Admit: 2020-05-28 | Discharge: 2020-05-28 | Disposition: A | Discharge status: Home / Self Care | Payer: Medicare Other | Source: Ambulatory Visit | Attending: Hematology & Oncology | Admitting: Hematology & Oncology

## 2020-05-28 ENCOUNTER — Other Ambulatory Visit: Payer: Self-pay | Admitting: Hematology & Oncology

## 2020-05-28 DIAGNOSIS — Z434 Encounter for attention to other artificial openings of digestive tract: Secondary | ICD-10-CM | POA: Diagnosis not present

## 2020-05-28 DIAGNOSIS — C259 Malignant neoplasm of pancreas, unspecified: Secondary | ICD-10-CM | POA: Insufficient documentation

## 2020-05-28 DIAGNOSIS — Q676 Pectus excavatum: Secondary | ICD-10-CM | POA: Diagnosis not present

## 2020-05-28 DIAGNOSIS — K8020 Calculus of gallbladder without cholecystitis without obstruction: Secondary | ICD-10-CM | POA: Insufficient documentation

## 2020-05-28 HISTORY — PX: IR CATHETER TUBE CHANGE: IMG717

## 2020-05-28 IMAGING — XA IR CATHETER TUBE CHANGE
2 series · 5 of 5 positions shown · non-contrast
Comparison: none

INDICATION: 89-year-old female with a history of acute calculus cholecystitis.
She was not an operative candidate at the time of presentation and
therefore a percutaneous transhepatic cholecystostomy tube was
placed on [DATE].

[Series 1: fl - angio · 4 of 90 frames shown]
[frame 11/90]
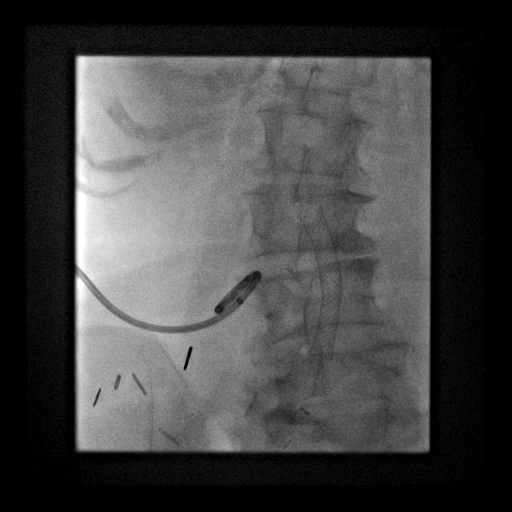
[frame 14/90]
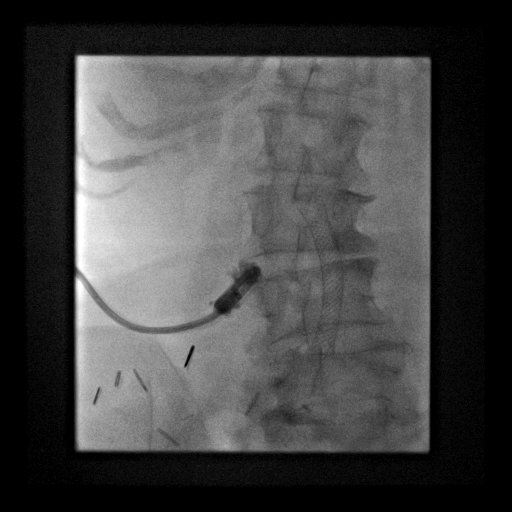
[frame 46/90]
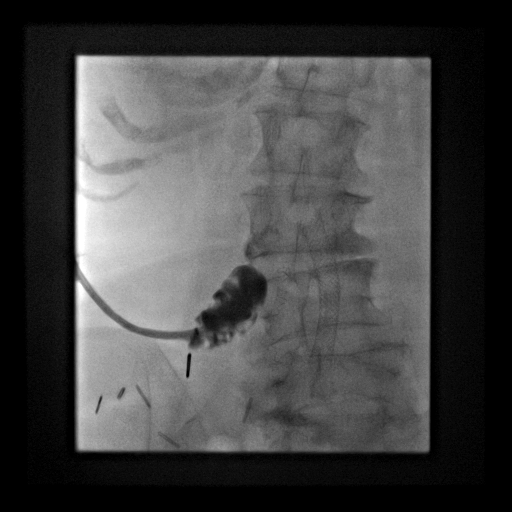
[frame 77/90]
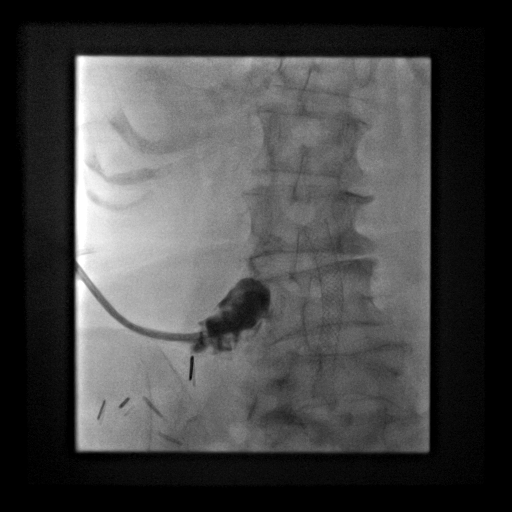

[Series 300: tube placements · 1 of 1 slices shown]
[im 1/1]
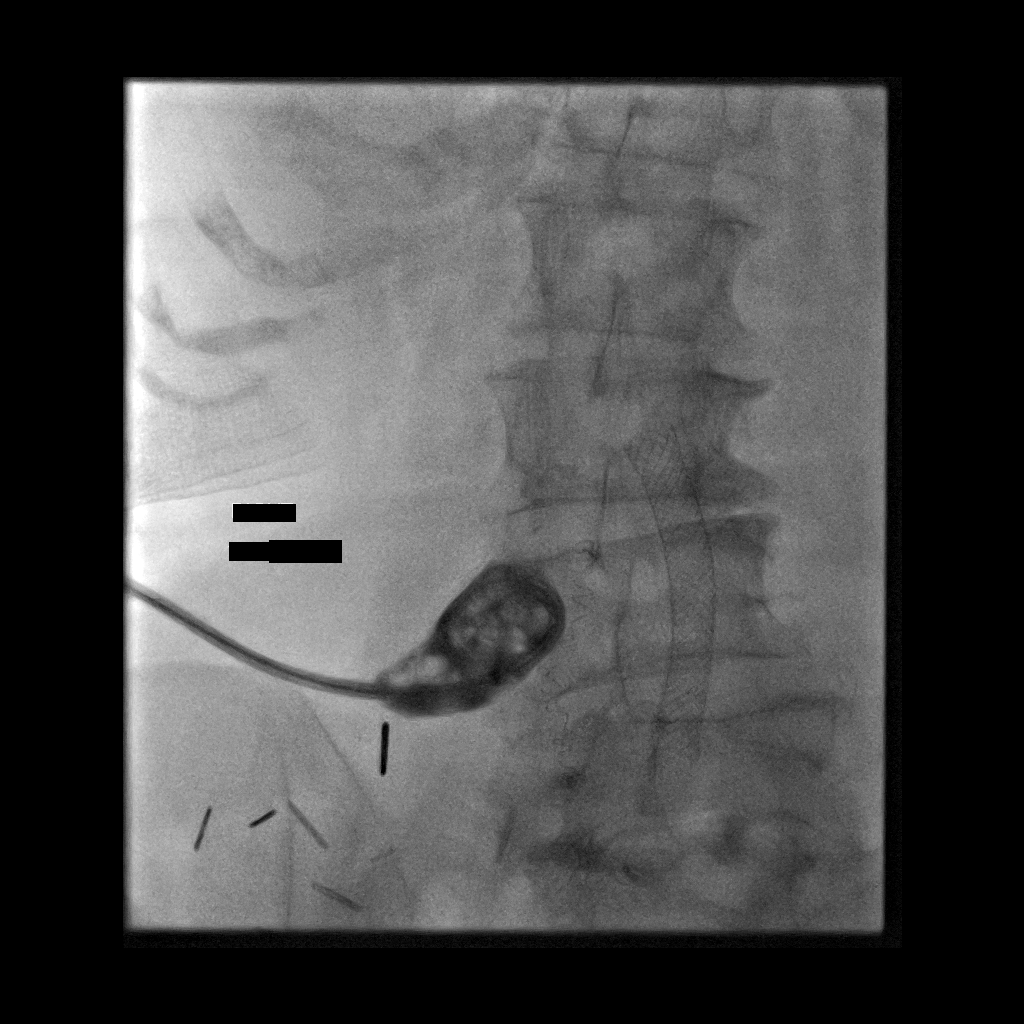

[5 of 5 positions shown; findings below may reference images not displayed]

She presents today for routine cholecystostomy tube exchange.
Currently, she is being evaluated by several surgeons to assess her
candidacy for future elective cholecystectomy.

EXAM:
IR CATHETER TUBE CHANGE

MEDICATIONS:
None.

ANESTHESIA/SEDATION:
None.

FLUOROSCOPY TIME:  Fluoroscopy Time: 0 minutes 36 seconds (2 mGy).

COMPLICATIONS:
None immediate.

PROCEDURE:
Informed written consent was obtained from the patient after a
thorough discussion of the procedural risks, benefits and
alternatives. All questions were addressed. Maximal Sterile Barrier
Technique was utilized including caps, mask, sterile gowns, sterile
gloves, sterile drape, hand hygiene and skin antiseptic. A timeout
was performed prior to the initiation of the procedure.

I hand injection of contrast material was performed through the
existing tube. The cystic duct is patent. Contrast material spills
into the common bile duct in exits the ampulla. The gallbladder
lumen is contracted. Numerous filling defects remain present
consistent with cholelithiasis.

The catheter was transected and removed over a wire. Oddly, there
was in growth of soft tissue from the patient's side into the
retention suture. This was anesthetized and excised with a scalpel.
Hemostasis was.

AUJLA 10.2 French all-purpose drainage catheter was advanced
over the wire and formed in the gallbladder lumen. Images were
obtained and stored for the medical record. The catheter was secured
to the skin with an adhesive fixation device. The catheter was
connected to gravity bag drainage. The patient tolerated the
procedure well.
IMPRESSION: 1. Cholelithiasis.  The cystic duct has regained patency.
2. Successful exchange of percutaneous cholecystostomy tube.

PLAN:
Recommend percutaneous cholecystostomy tube exchange every 8 weeks
until elective cholecystectomy can be performed.

## 2020-05-28 MED ORDER — SILVER NITRATE-POT NITRATE 75-25 % EX MISC
CUTANEOUS | Status: AC
  Filled 2020-05-28: qty 30

## 2020-05-28 MED ORDER — SILVER NITRATE-POT NITRATE 75-25 % EX MISC
CUTANEOUS | Status: AC | PRN
  Administered 2020-05-28: 4 via TOPICAL

## 2020-05-28 MED ORDER — SILVER NITRATE-POT NITRATE 75-25 % EX MISC
CUTANEOUS | Status: AC
  Filled 2020-05-28: qty 10

## 2020-05-28 MED ORDER — IOHEXOL 300 MG/ML  SOLN
50.0000 mL | Freq: Once | INTRAMUSCULAR | Status: AC | PRN
  Administered 2020-05-28: 12:00:00 10 mL

## 2020-05-28 MED ORDER — LIDOCAINE HCL 1 % IJ SOLN
INTRAMUSCULAR | Status: AC
  Filled 2020-05-28: qty 20

## 2020-05-28 MED ORDER — LIDOCAINE HCL 1 % IJ SOLN
INTRAMUSCULAR | Status: AC | PRN
  Administered 2020-05-28: 5 mL via INTRADERMAL

## 2020-05-29 ENCOUNTER — Ambulatory Visit (HOSPITAL_BASED_OUTPATIENT_CLINIC_OR_DEPARTMENT_OTHER)
Admission: RE | Admit: 2020-05-29 | Discharge: 2020-05-29 | Disposition: A | Discharge status: Home / Self Care | Payer: Medicare Other | Source: Ambulatory Visit | Attending: Hematology & Oncology | Admitting: Hematology & Oncology

## 2020-05-29 DIAGNOSIS — C259 Malignant neoplasm of pancreas, unspecified: Secondary | ICD-10-CM

## 2020-05-29 DIAGNOSIS — Z434 Encounter for attention to other artificial openings of digestive tract: Secondary | ICD-10-CM | POA: Diagnosis not present

## 2020-05-29 IMAGING — MR MR ABDOMEN W/O CM
6 of 7 series · 39 of 48 positions shown · non-contrast
Comparison: [DATE]

CLINICAL DATA: Pancreatic cancer, status post SBRT 1 month ago

EXAM:
MRI ABDOMEN WITHOUT CONTRAST
TECHNIQUE: Multiplanar multisequence MR imaging was performed without the
administration of intravenous contrast.

[Series 6: T2 · coronal · 7.0mm · 1.25mm/px · 4 of 27 slices shown (1 of 2)]
[im 1/27]
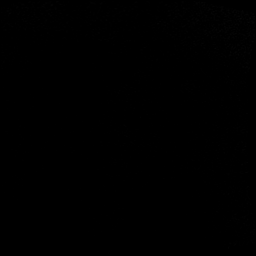
[im 9/27]
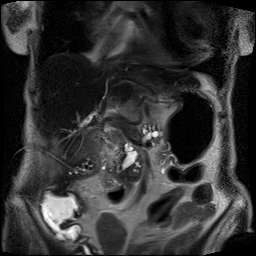
[im 18/27]
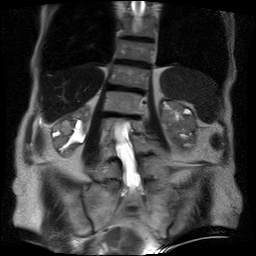
[im 27/27]
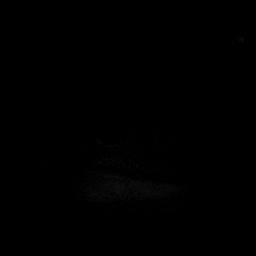

[Series 7: T2 fat-sat · axial · 6.0mm · 1.25mm/px · z∈[-90,+165]mm · 5 of 35 slices shown]
[im 1/35]
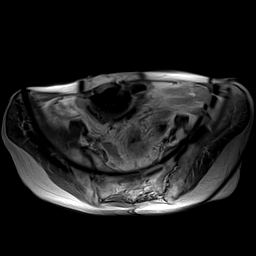
[im 9/35]
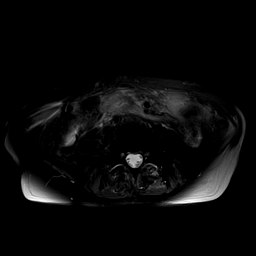
[im 18/35]
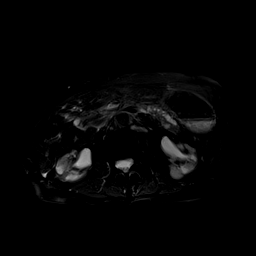
[im 26/35]
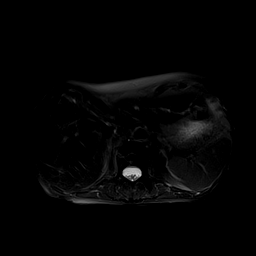
[im 35/35]
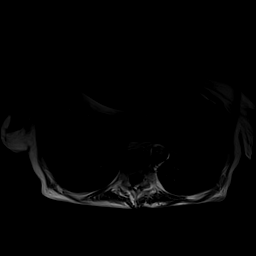

[Series 8: in + out · axial · 6.0mm · 0.74mm/px · z∈[-90,+165]mm · 9 of 70 slices shown]
[im 1/70]
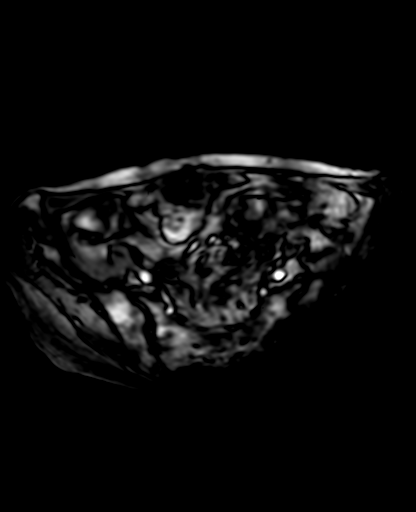
[im 9/70]
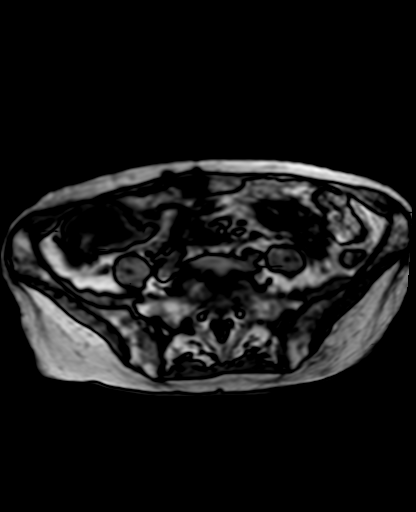
[im 18/70]
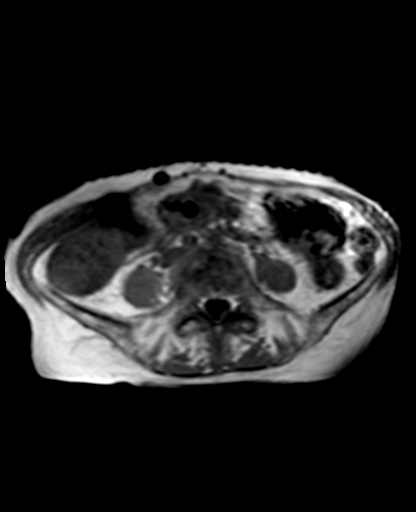
[im 26/70]
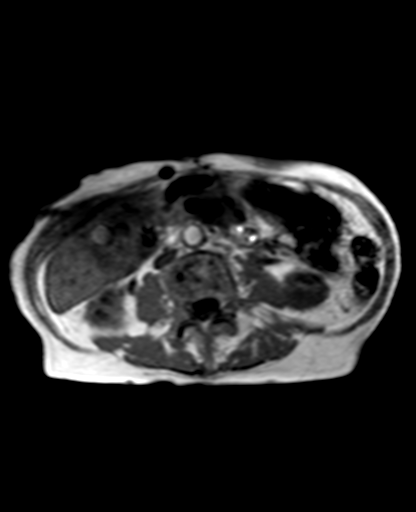
[im 35/70]
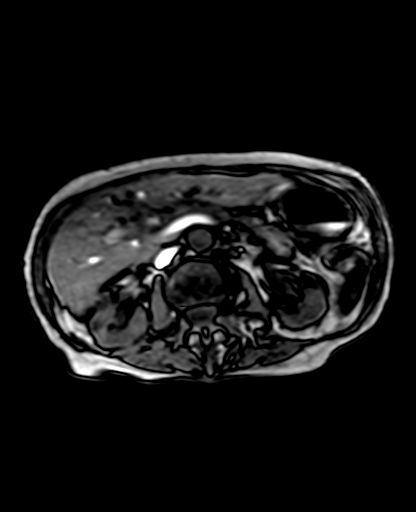
[im 44/70]
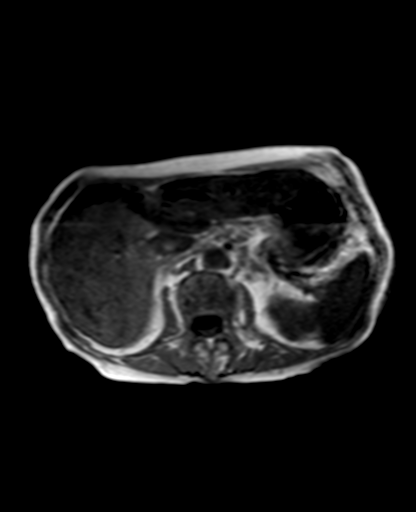
[im 52/70]
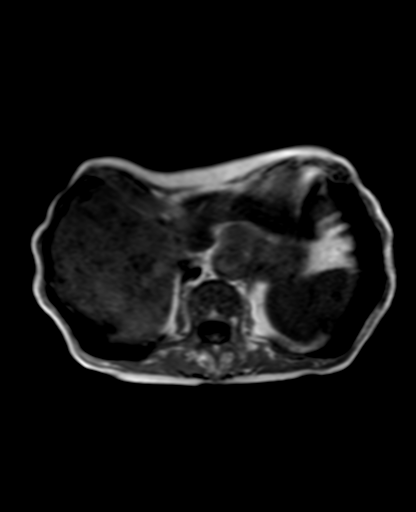
[im 61/70]
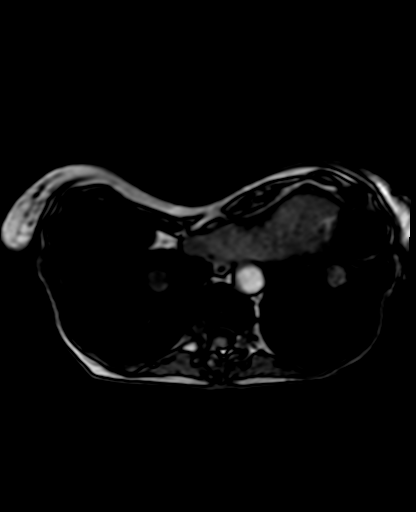
[im 70/70]
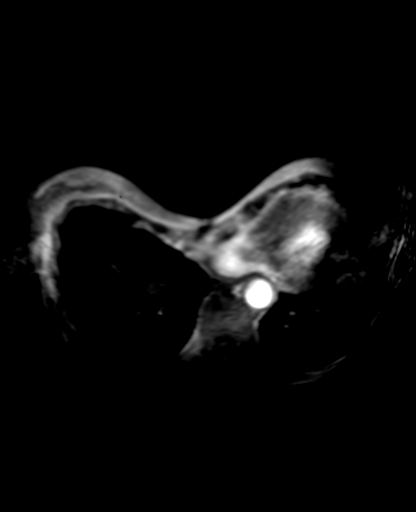

[Series 9: [id number]_500_800 free breathing · axial · 6.0mm · 1.98mm/px · z∈[-90,+165]mm · 13 of 103 slices shown]
[im 1/103]
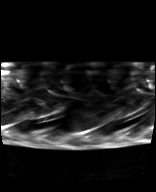
[im 9/103]
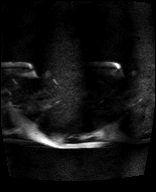
[im 18/103]
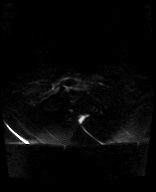
[im 26/103]
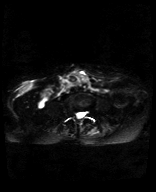
[im 35/103]
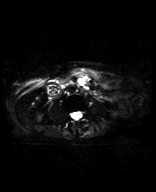
[im 43/103]
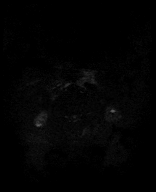
[im 52/103]
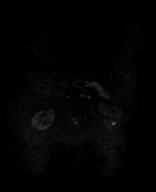
[im 60/103]
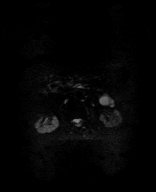
[im 69/103]
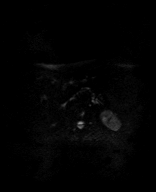
[im 77/103]
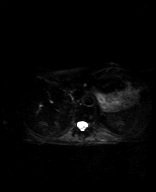
[im 86/103]
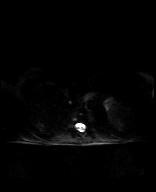
[im 94/103]
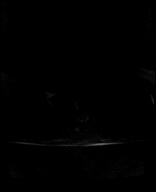
[im 103/103]
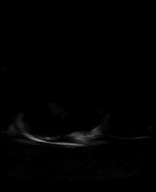

[Series 10: [id number] free breathing_adc · axial · 6.0mm · 1.98mm/px · z∈[-90,+165]mm · 4 of 35 slices shown]
[im 1/35]
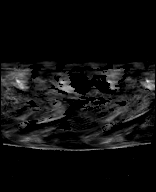
[im 12/35]
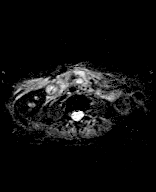
[im 23/35]
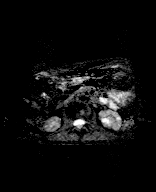
[im 35/35]
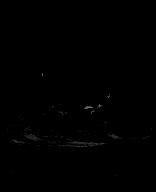

[Series 14: T2 · axial · 6.0mm · 1.48mm/px · z∈[-90,+165]mm · 4 of 35 slices shown (2 of 2)]
[im 1/35]
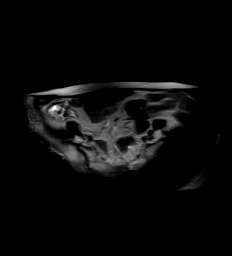
[im 12/35]
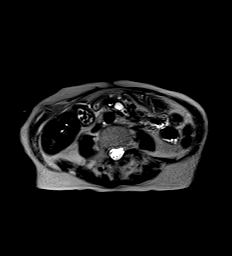
[im 23/35]
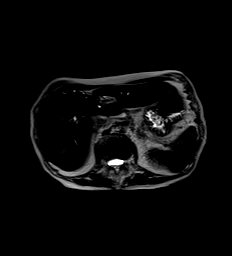
[im 35/35]
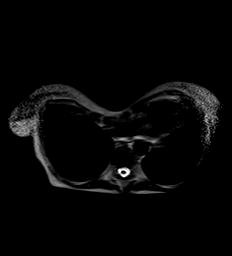

[39 of 48 positions shown; findings below may reference images not displayed]

FINDINGS: Despite efforts by the technologist and patient, motion artifact is
present on today's exam and could not be eliminated. This reduces
exam sensitivity and specificity.

Lower chest: Prominent pectus excavatum.

Hepatobiliary: Gallbladder wall thickened at 0.4 cm, although
substantially improved from [DATE]. Numerous small gallstones
are present. Linear filling defect in the common bile duct
compatible with stent. Cholecystostomy tube noted on image 9 series
6. Mild pericholecystic stranding/edema.

There is dropout of signal in the liver on inphase images compared
to out of phase images, hemochromatosis not excluded.

Pancreas: Beaded in dilated appearance of dorsal pancreatic duct
with some cystic elements noted, similar to previous. Particularly
in the absence of IV contrast, is difficult to visualize the
original pancreatic head mass which was highly indistinct even on
the contrast-enhanced exam of [DATE]. I do not appreciate a
changing contour of the pancreatic head to necessarily indicate
enlargement of the pancreatic head mass.

Spleen:  Mild signal dropout on in phase images.  No splenomegaly.

Adrenals/Urinary Tract: Similar appearance of scarring and dilated
elements of the renal collecting systems.

Stomach/Bowel: No specific abnormality identified.

Vascular/Lymphatic: No abdominal aortic aneurysm or well-defined
adenopathy.

Other: Low-level edema tracks along the right lateral abdominal wall
musculature in the vicinity of the cholecystostomy tube.

Musculoskeletal: Dextroconvex lumbar scoliosis with rotary
component.
IMPRESSION: 1. No appreciable change in contour of the pancreatic head to
suggest an enlarging mass in this vicinity; the pancreatic head
tumor is difficult to identify separately from surrounding
pancreatic parenchyma on today's noncontrast examination.
2. Numerous small gallstones are present. Gallbladder wall thickened
at 0.4 cm, although substantially improved from previous. Mild
pericholecystic stranding/edema. Cholecystostomy tube in place.
3. There is dropout of signal in the liver on inphase images
compared to out of phase images, hemochromatosis not excluded.
4. Similar appearance of scarring and dilated elements of the renal
collecting systems.
5. Prominent pectus excavatum. Dextroconvex lumbar scoliosis with
rotary component.

## 2020-05-31 ENCOUNTER — Other Ambulatory Visit (HOSPITAL_COMMUNITY): Payer: Self-pay | Admitting: Interventional Radiology

## 2020-05-31 ENCOUNTER — Encounter: Payer: Self-pay | Admitting: *Deleted

## 2020-05-31 DIAGNOSIS — K8000 Calculus of gallbladder with acute cholecystitis without obstruction: Secondary | ICD-10-CM

## 2020-05-31 NOTE — Progress Notes (Signed)
Glenda Napoleon, MD  P Onc Nurse Hp Call - the cancer is not growing!!! [facility] Nurse Navigator Documentation  Oncology Nurse Navigator Flowsheets 05/31/2020  Abnormal Finding Date -  Diagnosis Status -  Planned Course of Treatment -  Phase of Treatment -  Radiation Actual Start Date: -  Radiation Expected End Date: -  Radiation Actual End Date: -  Navigator Follow Up Date: 06/21/2020  Navigator Follow Up Reason: Follow-up Appointment  Navigation Complete Date: -  Navigator Location [facility]  Referral Date to RadOnc/MedOnc -  Navigator Encounter Type Telephone  Telephone Diagnostic Results;Outgoing Call  Patient Visit Type MedOnc  Treatment Phase Post-Tx Follow-up  Barriers/Navigation Needs Coordination of Care  Education -  Interventions Education;Psycho-Social Support  Acuity Level 2-Minimal Needs (1-2 Barriers Identified)  Referrals -  Coordination of Care -  Education Method Verbal  Support Groups/Services Friends and Family  Time Spent with Patient 15

## 2020-06-07 ENCOUNTER — Other Ambulatory Visit (HOSPITAL_COMMUNITY): Payer: Self-pay | Admitting: Interventional Radiology

## 2020-06-07 ENCOUNTER — Encounter: Payer: Self-pay | Admitting: *Deleted

## 2020-06-07 ENCOUNTER — Ambulatory Visit (HOSPITAL_COMMUNITY)
Admission: RE | Admit: 2020-06-07 | Discharge: 2020-06-07 | Disposition: A | Discharge status: Home / Self Care | Payer: Medicare Other | Source: Ambulatory Visit | Attending: Interventional Radiology | Admitting: Interventional Radiology

## 2020-06-07 ENCOUNTER — Other Ambulatory Visit: Payer: Self-pay

## 2020-06-07 DIAGNOSIS — K8020 Calculus of gallbladder without cholecystitis without obstruction: Secondary | ICD-10-CM | POA: Insufficient documentation

## 2020-06-07 DIAGNOSIS — Z434 Encounter for attention to other artificial openings of digestive tract: Secondary | ICD-10-CM | POA: Insufficient documentation

## 2020-06-07 DIAGNOSIS — K8000 Calculus of gallbladder with acute cholecystitis without obstruction: Secondary | ICD-10-CM

## 2020-06-07 HISTORY — PX: IR EXCHANGE BILIARY DRAIN: IMG6046

## 2020-06-07 IMAGING — XA IR EXCHANGE BILARY DRAIN
3 series · 11 of 11 positions shown · non-contrast
Comparison: Fluoroscopic cholecystostomy tube exchange-[DATE]

INDICATION: History of acute cholecystitis, post ultrasound fluoroscopic guided
cholecystostomy tube placement on [DATE].

Patient presents today reporting pain and pericatheter leakage
following recent fluoroscopic guided exchange.
EXAM:
FLUOROSCOPIC GUIDED CHOLECYSTOSTOMY TUBE EXCHANGE

[Series 2: fl - angio · 4 of 145 frames shown (1 of 2)]
[frame 22/145]
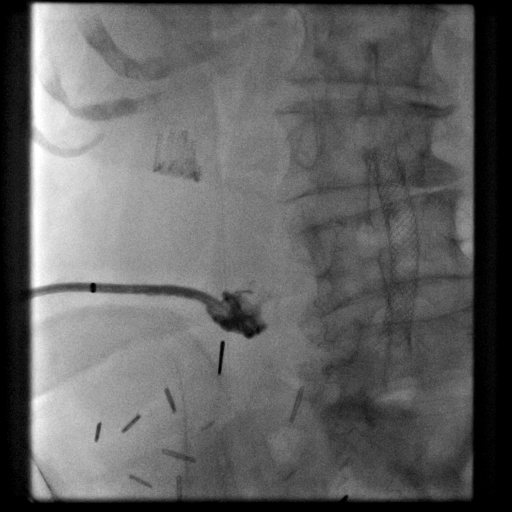
[frame 57/145]
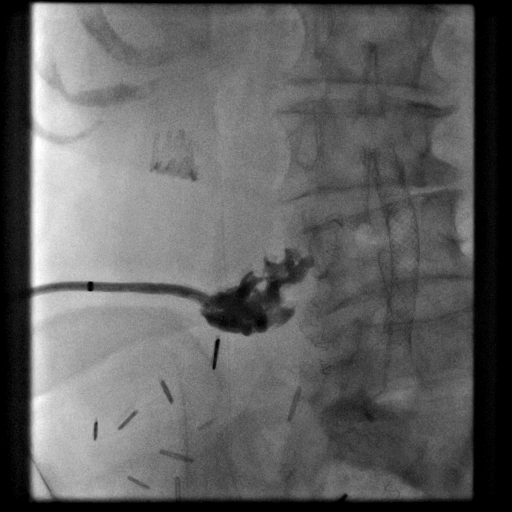
[frame 73/145]
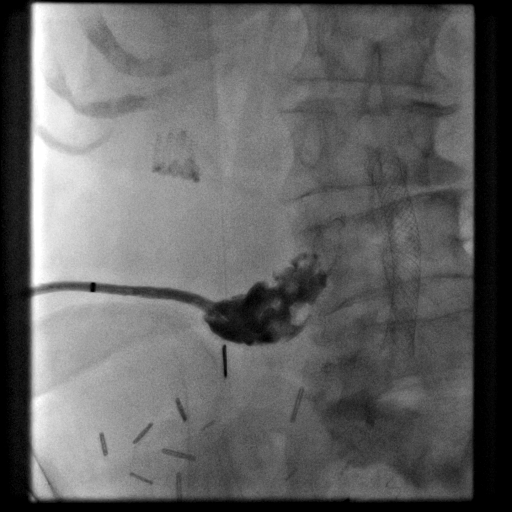
[frame 124/145]
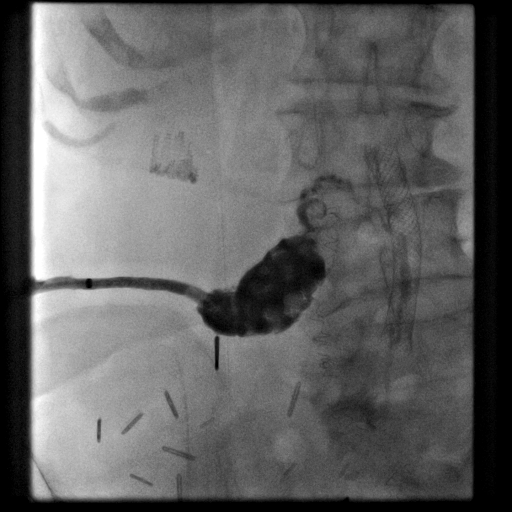

[Series 3: fl - angio · 4 of 34 frames shown (2 of 2)]
[frame 6/34]
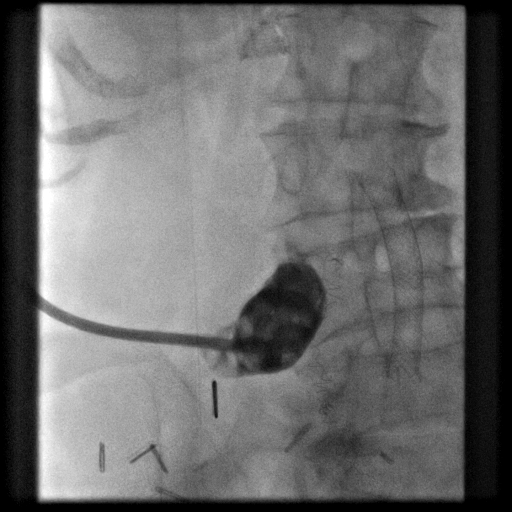
[frame 18/34]
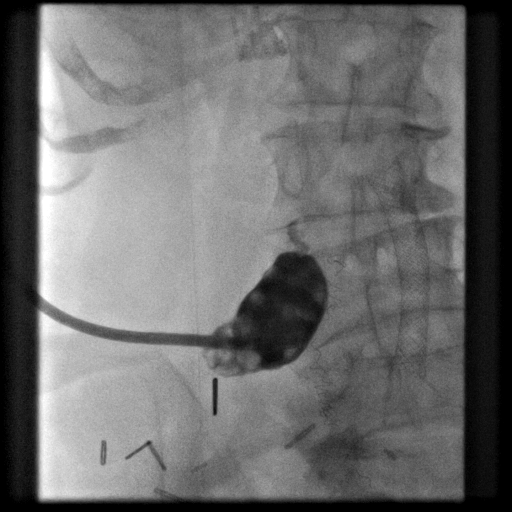
[frame 29/34]
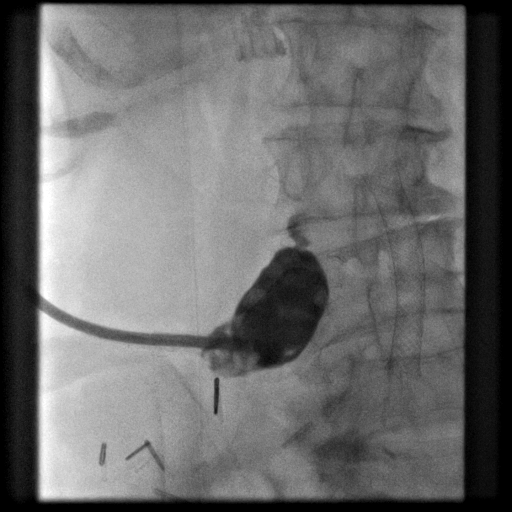
[frame 31/34]
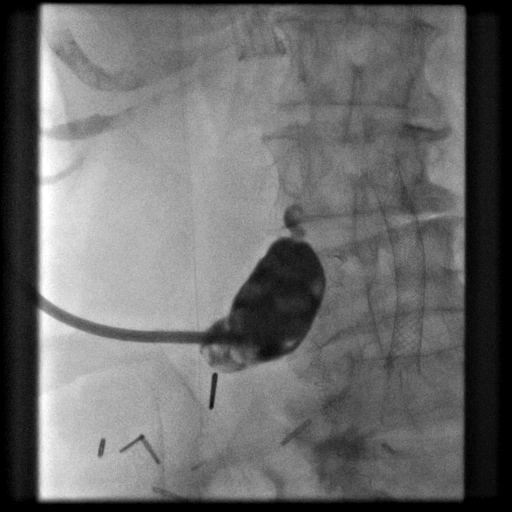

[Series 300: tube placements · 3 of 3 slices shown]
[im 1/3]
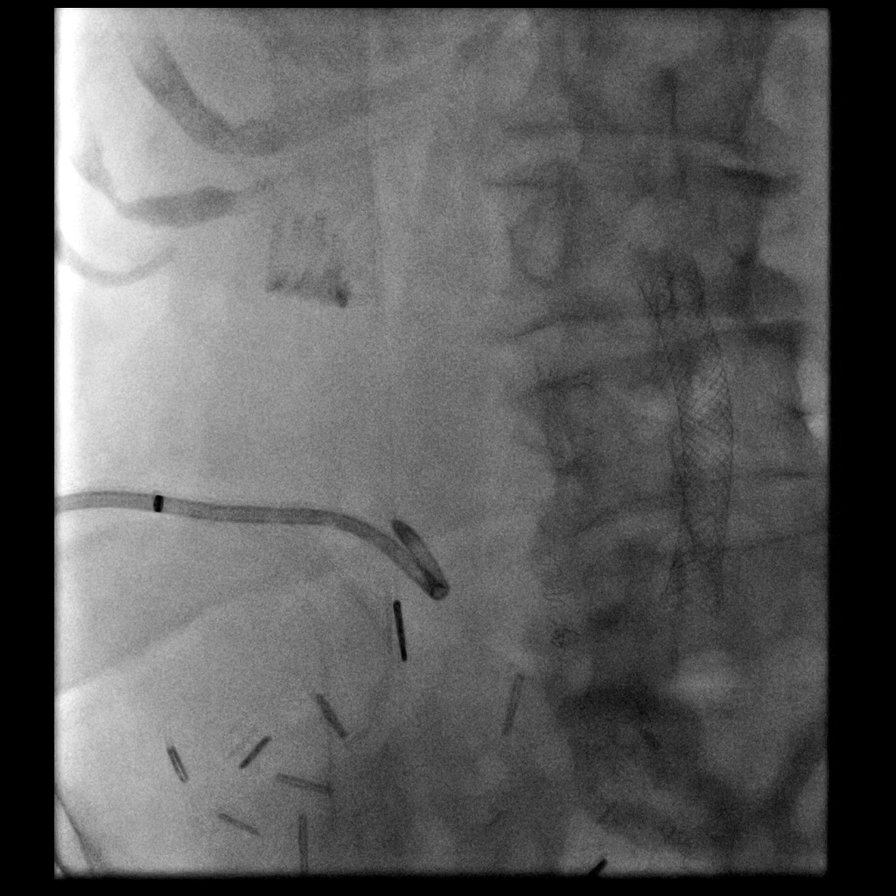
[im 2/3]
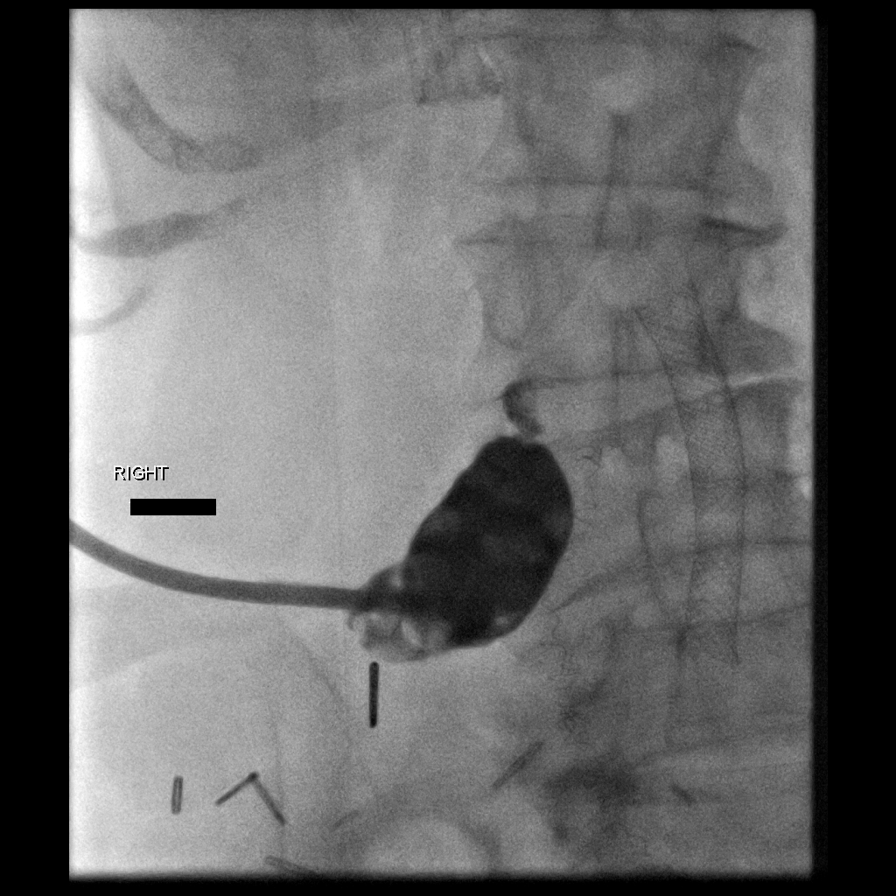
[im 3/3]
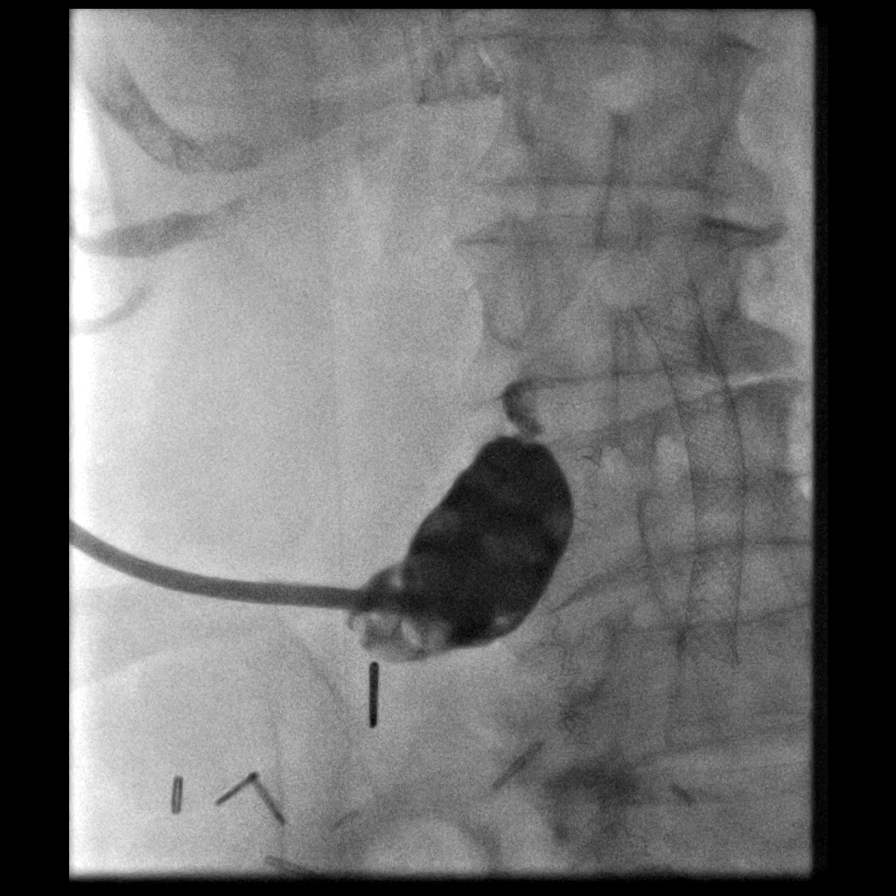

[11 of 11 positions shown; findings below may reference images not displayed]

MEDICATIONS:
None

ANESTHESIA/SEDATION:
None

CONTRAST:  15 cc Omnipaque 300 - administered into the gallbladder
lumen.

FLUOROSCOPY TIME:  12 seconds

COMPLICATIONS:
None immediate.

PROCEDURE:
The patient was positioned supine on the fluoroscopy table. The
external portion of the existing cholecystostomy tube as well as the
surrounding skin was prepped and draped in usual sterile fashion. A
time-out was performed prior to the initiation of the procedure.

A preprocedural spot fluoroscopic image was obtained of the right
upper abdominal quadrant existing cholecystostomy tube.

The skin surrounding the cholecystostomy tube was anesthetized with
1% lidocaine with epinephrine.

The external portion of the cholecystostomy tube was cut and
cannulated with a short Amplatz wire which was advanced through the
tube and coiled within the gallbladder lumen

Next, under intermittent fluoroscopic guidance, the existing 10
French cholecystostomy tube was exchanged for a new, slightly larger
now 12 French cholecystostomy tube which was repositioned into the
more central aspect of the gallbladder lumen.

Contrast injection confirms appropriate positioning and
functionality of the cholecystomy tube.

The cholecystostomy tube was flushed with a small amount of saline
and reconnected to a gravity bag. The cholecystostomy tube was
secured with an interrupted suture and a Stat Lock device. A
dressing was applied.

The patient tolerated the procedure well without immediate
postprocedural complication.
FINDINGS: Preprocedural spot fluoroscopic image demonstrates retraction of the
existing cholecystostomy tube. Contrast injection demonstrates the
distal tip of the cholecystostomy tube remains within the
gallbladder lumen however additional side holes project external to
the gallbladder.

There is passage of contrast through the cystic duct and the
pre-existing internal biliary stent to the level of the duodenum.
Multiple persistent filling defects are seen within the gallbladder
lumen compatible with known extensive cholelithiasis.

After fluoroscopic guided exchange, the new, slightly larger, now 12
French cholecystostomy tube is more ideally positioned with end
coiled and locked within the central aspect of the gallbladder
lumen.

Post exchange cholangiogram demonstrates appropriate positioning and
functionality of the new cholecystostomy tube.
IMPRESSION: 1. Successful fluoroscopic guided exchange, repositioning and up
sizing of now 12 French cholecystostomy tube.
2. Cholelithiasis without evidence of choledocholithiasis.

PLAN:
- The patient's cholecystostomy tube was reconnected to a gravity
bag.

- If interval cholecystectomy is not pursued, would recommend
fluoroscopic guided exchange in 8 weeks.

- Ultimately, if the patient is deemed a non operative candidate,
could proceed with trial of cholecystostomy tube capping though
admittedly there is a higher likelihood of recurrent cholecystitis
given cholelithiasis burden.

The patient knows to call the [REDACTED]

## 2020-06-07 MED ORDER — IOHEXOL 300 MG/ML  SOLN
50.0000 mL | Freq: Once | INTRAMUSCULAR | Status: AC | PRN
  Administered 2020-06-07: 16:00:00 25 mL

## 2020-06-07 MED ORDER — LIDOCAINE HCL 1 % IJ SOLN
INTRAMUSCULAR | Status: AC
  Filled 2020-06-07: qty 20

## 2020-06-07 NOTE — Procedures (Signed)
Pre procedural Dx: Leakage surrounding the chole tube. Post procedural Dx: Same  Successful fluoro guided exchange and up-sizing of now 12 Fr chole tube. Chole tube connected to gravity bag.  EBL: None Complications: None immediate  Ronny Bacon, MD Pager #: [phone]

## 2020-06-07 NOTE — Progress Notes (Signed)
Received a call from the patient. She states that last week someone called her from IR explaining that her drainage tube had to be discontinued. She requested they call her home care RN, Carolyne Fiscal, at [phone] and give her instructions which they did. Patient states the RN removed the tube. She then states that she began leaking through the site steadily over the next two days. The RN saw her again yesterday and manually replaced the tube. Patient states she is unsure of who to call to discuss these events, as well as she has developed discomfort at the tube site.   Called and spoke to a PA in Interventional Radiology. They will contact the patient regarding the needed follow up.   Oncology Nurse Navigator Documentation  Oncology Nurse Navigator Flowsheets 06/07/2020  Abnormal Finding Date -  Diagnosis Status -  Planned Course of Treatment -  Phase of Treatment -  Radiation Actual Start Date: -  Radiation Expected End Date: -  Radiation Actual End Date: -  Navigator Follow Up Date: 06/21/2020  Navigator Follow Up Reason: Follow-up Appointment  Navigation Complete Date: -  Navigator Location [facility]  Referral Date to RadOnc/MedOnc -  Navigator Encounter Type Telephone  Telephone Patient Update  Patient Visit Type MedOnc  Treatment Phase Post-Tx Follow-up  Barriers/Navigation Needs Coordination of Care  Education -  Interventions Education;Psycho-Social Support  Acuity Level 2-Minimal Needs (1-2 Barriers Identified)  Referrals -  Coordination of Care Radiology  Education Method Verbal  Support Groups/Services Friends and Family  Time Spent with Patient 71

## 2020-06-21 ENCOUNTER — Other Ambulatory Visit: Payer: Self-pay

## 2020-06-21 ENCOUNTER — Inpatient Hospital Stay: Payer: Medicare Other | Attending: Hematology & Oncology

## 2020-06-21 ENCOUNTER — Encounter: Payer: Self-pay | Admitting: Hematology & Oncology

## 2020-06-21 ENCOUNTER — Inpatient Hospital Stay (HOSPITAL_BASED_OUTPATIENT_CLINIC_OR_DEPARTMENT_OTHER): Payer: Medicare Other | Admitting: Hematology & Oncology

## 2020-06-21 VITALS — BP 145/68 | HR 72 | Temp 97.9°F | Resp 20 | Ht 68.0 in | Wt 94.4 lb

## 2020-06-21 DIAGNOSIS — C259 Malignant neoplasm of pancreas, unspecified: Secondary | ICD-10-CM

## 2020-06-21 DIAGNOSIS — E860 Dehydration: Secondary | ICD-10-CM | POA: Diagnosis not present

## 2020-06-21 DIAGNOSIS — Z936 Other artificial openings of urinary tract status: Secondary | ICD-10-CM | POA: Insufficient documentation

## 2020-06-21 LAB — CBC WITH DIFFERENTIAL (CANCER CENTER ONLY)
Abs Immature Granulocytes: 0.11 10*3/uL — ABNORMAL HIGH (ref 0.00–0.07)
Basophils Absolute: 0.1 10*3/uL (ref 0.0–0.1)
Basophils Relative: 1 %
Eosinophils Absolute: 0.1 10*3/uL (ref 0.0–0.5)
Eosinophils Relative: 2 %
HCT: 33.5 % — ABNORMAL LOW (ref 36.0–46.0)
Hemoglobin: 10.6 g/dL — ABNORMAL LOW (ref 12.0–15.0)
Immature Granulocytes: 2 %
Lymphocytes Relative: 10 %
Lymphs Abs: 0.6 10*3/uL — ABNORMAL LOW (ref 0.7–4.0)
MCH: 32.8 pg (ref 26.0–34.0)
MCHC: 31.6 g/dL (ref 30.0–36.0)
MCV: 103.7 fL — ABNORMAL HIGH (ref 80.0–100.0)
Monocytes Absolute: 0.4 10*3/uL (ref 0.1–1.0)
Monocytes Relative: 7 %
Neutro Abs: 5.1 10*3/uL (ref 1.7–7.7)
Neutrophils Relative %: 78 %
Platelet Count: 190 10*3/uL (ref 150–400)
RBC: 3.23 MIL/uL — ABNORMAL LOW (ref 3.87–5.11)
RDW: 12.8 % (ref 11.5–15.5)
WBC Count: 6.4 10*3/uL (ref 4.0–10.5)
nRBC: 0 % (ref 0.0–0.2)

## 2020-06-21 LAB — CMP (CANCER CENTER ONLY)
ALT: 29 U/L (ref 0–44)
AST: 27 U/L (ref 15–41)
Albumin: 3.7 g/dL (ref 3.5–5.0)
Alkaline Phosphatase: 127 U/L — ABNORMAL HIGH (ref 38–126)
Anion gap: 7 (ref 5–15)
BUN: 60 mg/dL — ABNORMAL HIGH (ref 8–23)
CO2: 27 mmol/L (ref 22–32)
Calcium: 9.8 mg/dL (ref 8.9–10.3)
Chloride: 101 mmol/L (ref 98–111)
Creatinine: 1.52 mg/dL — ABNORMAL HIGH (ref 0.44–1.00)
GFR, Estimated: 33 mL/min — ABNORMAL LOW (ref >60)
Glucose, Bld: 234 mg/dL — ABNORMAL HIGH (ref 70–99)
Potassium: 5.2 mmol/L — ABNORMAL HIGH (ref 3.5–5.1)
Sodium: 135 mmol/L (ref 135–145)
Total Bilirubin: 0.3 mg/dL (ref 0.3–1.2)
Total Protein: 7.8 g/dL (ref 6.5–8.1)

## 2020-06-21 NOTE — Progress Notes (Signed)
Hematology and Oncology Follow Up Visit  Glenda King [id number] 1929-05-03 87 y.o. 06/21/2020   Principle Diagnosis:  Pancreatic cancer-localized  Current Therapy:        Status post radiosurgery-completed on 01/29/2020.     Interim History:  Glenda King is here today for follow-up.  He seems to be doing okay.  She did have the cholecystotomy tube exchanged.  This is doing a whole lot better for her.  It is a lot shorter so does not cause him much aggravation.  She has very little drainage from the cholecystotomy tube.  Surgery does not want to take her to cholecystectomy as of yet.  The cholecystotomy tube will stay in place until that she sees surgery which will be sometime in February.  Her last CA 19-9 was up a little bit.  It was up to 180.  Her last scans were done in December which looked everything stable.  She does little dehydrated.  I offered her some IV fluids.  She wanted to hold off on this for today.  Her BUN and creatinine are both up which again will go along with some dehydration.  She said that she is drinking as much as she can.  I am not sure exactly what that means.  She is not having any problems with pain.  There is no issues with bowels or bladder.  She is not having any diarrhea.  Her blood sugars are on the high side.  I told her that she is diabetic, at least by the high blood sugars that she has every time we see her.  However, given the "big picture" I really do not think this is that much of an issue.  Overall, I would have to say that her performance status is probably ECOG 2, at best.    Medications:  Allergies as of 06/21/2020      Reactions   Gadolinium Derivatives Other (See Comments)   Secondary to her stage IV kidney disease   Amoxicillin Other (See Comments)   UNKNOWN REACTION   Pollen Extract Other (See Comments)   Sneezing      Medication List       Accurate as of June 21, 2020  5:29 PM. If you have any questions, ask  your nurse or doctor.        cholecalciferol 25 MCG (1000 UNIT) tablet Commonly known as: VITAMIN D3 Take 1,000 Units by mouth 2 (two) times daily.   cyanocobalamin 1000 MCG tablet Take 1 tablet (1,000 mcg total) by mouth daily.   levETIRAcetam 500 MG tablet Commonly known as: KEPPRA Take 500 mg by mouth 2 (two) times daily.   levothyroxine 100 MCG tablet Commonly known as: SYNTHROID Take 100 mcg by mouth daily.   Melatonin 10 MG Caps Take 10 mg by mouth at bedtime.   metoprolol tartrate 25 MG tablet Commonly known as: LOPRESSOR Take 1 tablet (25 mg total) by mouth 2 (two) times daily.   pantoprazole 40 MG tablet Commonly known as: PROTONIX Take 1 tablet (40 mg total) by mouth daily.   sodium bicarbonate 650 MG tablet Take 650 mg by mouth 2 (two) times daily.       Allergies:  Allergies  Allergen Reactions  . Gadolinium Derivatives Other (See Comments)    Secondary to her stage IV kidney disease   . Amoxicillin Other (See Comments)    UNKNOWN REACTION     . Pollen Extract Other (See Comments)    Sneezing  Past Medical History, Surgical history, Social history, and Family History were reviewed and updated.  Review of Systems: Review of Systems  Constitutional: Positive for malaise/fatigue and weight loss.  HENT: Negative.   Eyes: Negative.   Respiratory: Negative.   Cardiovascular: Negative.   Gastrointestinal: Negative.   Genitourinary: Negative.   Musculoskeletal: Negative.   Skin: Negative.   Neurological: Negative.   Endo/Heme/Allergies: Negative.   Psychiatric/Behavioral: Negative.      Physical Exam:  height is 5\' 8"  (1.727 m) and weight is 94 lb 6.4 oz (42.8 kg). Her oral temperature is 97.9 F (36.6 C). Her blood pressure is 145/68 (abnormal) and her pulse is 72. Her respiration is 20 and oxygen saturation is 99%.   Wt Readings from Last 3 Encounters:  06/21/20 94 lb 6.4 oz (42.8 kg)  05/24/20 94 lb 12 oz (43 kg)  04/26/20 98 lb  (44.5 kg)    Physical Exam Vitals reviewed.  Constitutional:      Comments: Glenda King is a thin white female.  She is not jaundiced.  She has symmetric muscle atrophy in upper and lower extremities.  She has the cholecystostomy tube in the right upper quadrant.  There is no drainage in the bag.  There is no erythema or warmth at the entry site.  HENT:     Head: Normocephalic and atraumatic.  Eyes:     Pupils: Pupils are equal, round, and reactive to light.  Cardiovascular:     Rate and Rhythm: Normal rate and regular rhythm.     Heart sounds: Normal heart sounds.  Pulmonary:     Effort: Pulmonary effort is normal.     Breath sounds: Normal breath sounds.  Abdominal:     General: Bowel sounds are normal.     Palpations: Abdomen is soft.  Musculoskeletal:        General: No tenderness or deformity. Normal range of motion.     Cervical back: Normal range of motion.  Lymphadenopathy:     Cervical: No cervical adenopathy.  Skin:    General: Skin is warm and dry.     Findings: No erythema or rash.  Neurological:     Mental Status: She is alert and oriented to person, place, and time.  Psychiatric:        Behavior: Behavior normal.        Thought Content: Thought content normal.        Judgment: Judgment normal.      Lab Results  Component Value Date   WBC 6.4 06/21/2020   HGB 10.6 (L) 06/21/2020   HCT 33.5 (L) 06/21/2020   MCV 103.7 (H) 06/21/2020   PLT 190 06/21/2020   Lab Results  Component Value Date   FERRITIN 822 (H) 04/26/2020   IRON 53 04/26/2020   TIBC 203 (L) 04/26/2020   UIBC 150 04/26/2020   IRONPCTSAT 26 04/26/2020   Lab Results  Component Value Date   RETICCTPCT 1.1 04/03/2020   RBC 3.23 (L) 06/21/2020   No results found for: KPAFRELGTCHN, LAMBDASER, KAPLAMBRATIO No results found for: IGGSERUM, IGA, IGMSERUM No results found for: Ronnald Ramp, A1GS, Gilford Silvius, MSPIKE, SPEI   Chemistry      Component Value  Date/Time   NA 135 06/21/2020 1413   K 5.2 (H) 06/21/2020 1413   CL 101 06/21/2020 1413   CO2 27 06/21/2020 1413   BUN 60 (H) 06/21/2020 1413   CREATININE 1.52 (H) 06/21/2020 1413      Component Value Date/Time  CALCIUM 9.8 06/21/2020 1413   ALKPHOS 127 (H) 06/21/2020 1413   AST 27 06/21/2020 1413   ALT 29 06/21/2020 1413   BILITOT 0.3 06/21/2020 1413       Impression and Plan: Glenda King is a very charming 87 yo white female with history of pancreatic cancer.  She was treated with stereotactic radiosurgery.  By the last MRI, we would not see anything that looks like progression.  However, this was done without contrast.  We will have to see what the CA 19-9 looks like.  This will certainly be very interesting.  I think if the level is higher, we may have to think about another scan.  Again we will be hampered little bit because of the lack of IV contrast.  Again we are dealing with quality of life.  The exchanged cholecystotomy tube definitely is helping.  I just wish you again a little bit more weight.  Again she is dehydrated.  We will have to see if she goes for some IV fluids.  Again, we will have to follow her closely.  I would want to have her come back to see [place] in about 3 weeks.  I am not sure if she will ever have the gallbladder taken out given her overall performance status.  [name], MD 1/10/20225:29 PM

## 2020-06-22 ENCOUNTER — Encounter: Payer: Self-pay | Admitting: *Deleted

## 2020-06-22 ENCOUNTER — Telehealth: Payer: Self-pay

## 2020-06-22 LAB — LACTATE DEHYDROGENASE: LDH: 86 U/L — ABNORMAL LOW (ref 98–192)

## 2020-06-22 LAB — CANCER ANTIGEN 19-9: CA 19-9: 364 U/mL — ABNORMAL HIGH (ref 0–35)

## 2020-06-22 NOTE — Telephone Encounter (Signed)
Called and left a message with the 07/14/20 appts per the 06/21/20 los, also per 06/21/20 los I asked that she call back if she would like to come in for ivf this week     aom

## 2020-06-22 NOTE — Progress Notes (Signed)
Oncology Nurse Navigator Documentation  Oncology Nurse Navigator Flowsheets 06/22/2020  Abnormal Finding Date -  Diagnosis Status -  Planned Course of Treatment -  Phase of Treatment -  Radiation Actual Start Date: -  Radiation Expected End Date: -  Radiation Actual End Date: -  Navigator Follow Up Date: 07/14/2020  Navigator Follow Up Reason: Follow-up Appointment  Navigation Complete Date: -  Navigator Location [facility]  Referral Date to RadOnc/MedOnc -  Navigator Encounter Type Appt/Treatment Plan Review  Telephone -  Patient Visit Type MedOnc  Treatment Phase Post-Tx Follow-up  Barriers/Navigation Needs Coordination of Care  Education -  Interventions None Required  Acuity Level 2-Minimal Needs (1-2 Barriers Identified)  Referrals -  Coordination of Care -  Education Method -  Support Groups/Services Friends and Family  Time Spent with Patient 15

## 2020-06-22 NOTE — Telephone Encounter (Signed)
pt called back in and would like to get ivf on Friday 06/25/20

## 2020-06-25 ENCOUNTER — Inpatient Hospital Stay: Payer: Medicare Other

## 2020-06-25 ENCOUNTER — Other Ambulatory Visit: Payer: Self-pay

## 2020-06-25 VITALS — BP 105/74 | HR 67 | Temp 97.7°F | Resp 18

## 2020-06-25 DIAGNOSIS — C259 Malignant neoplasm of pancreas, unspecified: Secondary | ICD-10-CM | POA: Diagnosis not present

## 2020-06-25 DIAGNOSIS — E860 Dehydration: Secondary | ICD-10-CM

## 2020-06-25 MED ORDER — SODIUM CHLORIDE 0.9 % IV SOLN
Freq: Once | INTRAVENOUS | Status: DC
  Filled 2020-06-25: qty 250

## 2020-06-25 MED ORDER — SODIUM CHLORIDE 0.9 % IV SOLN
Freq: Once | INTRAVENOUS | Status: AC
  Filled 2020-06-25: qty 250

## 2020-06-25 NOTE — Patient Instructions (Signed)
Dehydration, Adult Dehydration is condition in which there is not enough water or other fluids in the body. This happens when a person loses more fluids than he or she takes in. Important body parts cannot work right without the right amount of fluids. Any loss of fluids from the body can cause dehydration. Dehydration can be mild, worse, or very bad. It should be treated right away to keep it from getting very bad. What are the causes? This condition may be caused by:  Conditions that cause loss of water or other fluids, such as: ? Watery poop (diarrhea). ? Vomiting. ? Sweating a lot. ? Peeing (urinating) a lot.  Not drinking enough fluids, especially when you: ? Are ill. ? Are doing things that take a lot of energy to do.  Other illnesses and conditions, such as fever or infection.  Certain medicines, such as medicines that take extra fluid out of the body (diuretics).  Lack of safe drinking water.  Not being able to get enough water and food. What increases the risk? The following factors may make you more likely to develop this condition:  Having a long-term (chronic) illness that has not been treated the right way, such as: ? Diabetes. ? Heart disease. ? Kidney disease.  Being 65 years of age or older.  Having a disability.  Living in a place that is high above the ground or sea (high in altitude). The thinner, dried air causes more fluid loss.  Doing exercises that put stress on your body for a long time. What are the signs or symptoms? Symptoms of dehydration depend on how bad it is. Mild or worse dehydration  Thirst.  Dry lips or dry mouth.  Feeling dizzy or light-headed, especially when you stand up from sitting.  Muscle cramps.  Your body making: ? Dark pee (urine). Pee may be the color of tea. ? Less pee than normal. ? Less tears than normal.  Headache. Very bad dehydration  Changes in skin. Skin may: ? Be cold to the touch (clammy). ? Be blotchy  or pale. ? Not go back to normal right after you lightly pinch it and let it go.  Little or no tears, pee, or sweat.  Changes in vital signs, such as: ? Fast breathing. ? Low blood pressure. ? Weak pulse. ? Pulse that is more than 100 beats a minute when you are sitting still.  Other changes, such as: ? Feeling very thirsty. ? Eyes that look hollow (sunken). ? Cold hands and feet. ? Being mixed up (confused). ? Being very tired (lethargic) or having trouble waking from sleep. ? Short-term weight loss. ? Loss of consciousness. How is this treated? Treatment for this condition depends on how bad it is. Treatment should start right away. Do not wait until your condition gets very bad. Very bad dehydration is an emergency. You will need to go to a hospital.  Mild or worse dehydration can be treated at home. You may be asked to: ? Drink more fluids. ? Drink an oral rehydration solution (ORS). This drink helps get the right amounts of fluids and salts and minerals in the blood (electrolytes).  Very bad dehydration can be treated: ? With fluids through an IV tube. ? By getting normal levels of salts and minerals in your blood. This is often done by giving salts and minerals through a tube. The tube is passed through your nose and into your stomach. ? By treating the root cause. Follow these instructions at   home: Oral rehydration solution If told by your doctor, drink an ORS:  Make an ORS. Use instructions on the package.  Start by drinking small amounts, about  cup (120 mL) every 5-10 minutes.  Slowly drink more until you have had the amount that your doctor said to have. Eating and drinking  Drink enough clear fluid to keep your pee pale yellow. If you were told to drink an ORS, finish the ORS first. Then, start slowly drinking other clear fluids. Drink fluids such as: ? Water. Do not drink only water. Doing that can make the salt (sodium) level in your body get too low. ? Water  from ice chips you suck on. ? Fruit juice that you have added water to (diluted). ? Low-calorie sports drinks.  Eat foods that have the right amounts of salts and minerals, such as: ? Bananas. ? Oranges. ? Potatoes. ? Tomatoes. ? Spinach.  Do not drink alcohol.  Avoid: ? Drinks that have a lot of sugar. These include:  High-calorie sports drinks.  Fruit juice that you did not add water to.  Soda.  Caffeine. ? Foods that are greasy or have a lot of fat or sugar.         General instructions  Take over-the-counter and prescription medicines only as told by your doctor.  Do not take salt tablets. Doing that can make the salt level in your body get too high.  Return to your normal activities as told by your doctor. Ask your doctor what activities are safe for you.  Keep all follow-up visits as told by your doctor. This is important. Contact a doctor if:  You have pain in your belly (abdomen) and the pain: ? Gets worse. ? Stays in one place.  You have a rash.  You have a stiff neck.  You get angry or annoyed (irritable) more easily than normal.  You are more tired or have a harder time waking than normal.  You feel: ? Weak or dizzy. ? Very thirsty. Get help right away if you have:  Any symptoms of very bad dehydration.  Symptoms of vomiting, such as: ? You cannot eat or drink without vomiting. ? Your vomiting gets worse or does not go away. ? Your vomit has blood or green stuff in it.  Symptoms that get worse with treatment.  A fever.  A very bad headache.  Problems with peeing or pooping (having a bowel movement), such as: ? Watery poop that gets worse or does not go away. ? Blood in your poop (stool). This may cause poop to look black and tarry. ? Not peeing in 6-8 hours. ? Peeing only a small amount of very dark pee in 6-8 hours.  Trouble breathing. These symptoms may be an emergency. Do not wait to see if the symptoms will go away. Get  medical help right away. Call your local emergency services (911 in the [place] Do not drive yourself to the hospital. Summary  Dehydration is a condition in which there is not enough water or other fluids in the body. This happens when a person loses more fluids than he or she takes in.  Treatment for this condition depends on how bad it is. Treatment should be started right away. Do not wait until your condition gets very bad.  Drink enough clear fluid to keep your pee pale yellow. If you were told to drink an oral rehydration solution (ORS), finish the ORS first. Then, start slowly drinking other clear fluids.    Take over-the-counter and prescription medicines only as told by your doctor.  Get help right away if you have any symptoms of very bad dehydration. This information is not intended to replace advice given to you by your health care provider. Make sure you discuss any questions you have with your health care provider. Document Revised: 01/09/2019 Document Reviewed: 01/09/2019 Elsevier Patient Education  2021 Elsevier Inc.  

## 2020-06-29 ENCOUNTER — Other Ambulatory Visit (HOSPITAL_COMMUNITY): Payer: Self-pay | Admitting: Radiology

## 2020-06-29 ENCOUNTER — Telehealth: Payer: Self-pay | Admitting: Radiology

## 2020-06-29 DIAGNOSIS — K8000 Calculus of gallbladder with acute cholecystitis without obstruction: Secondary | ICD-10-CM

## 2020-06-29 NOTE — Progress Notes (Signed)
Patient ID: Glenda King, female   DOB: [birth year], 87 y.o.   MRN: [id number] Pt called stating [facility] RN came to flush chole tube today and had some concerns for leaking and redness at the skin site. Pt states no fever or pain. Last drain exchange/upsize was 12/27 States RN had no trouble flushing but also reports not much output in bag. Known stones/sludge but cystic duct also patent on imaging.  Given pt and [place] RN concerns, feel best to evaluate tube. Gave pt appointment time for 1/19 10:00.  Ascencion Dike PA-C Interventional Radiology 06/29/2020 2:20 PM

## 2020-06-30 ENCOUNTER — Other Ambulatory Visit: Payer: Self-pay

## 2020-06-30 ENCOUNTER — Ambulatory Visit (HOSPITAL_COMMUNITY)
Admission: RE | Admit: 2020-06-30 | Discharge: 2020-06-30 | Disposition: A | Discharge status: Home / Self Care | Payer: Medicare Other | Source: Ambulatory Visit | Attending: Radiology | Admitting: Radiology

## 2020-06-30 DIAGNOSIS — K8000 Calculus of gallbladder with acute cholecystitis without obstruction: Secondary | ICD-10-CM | POA: Insufficient documentation

## 2020-06-30 NOTE — Progress Notes (Signed)
Interventional Radiology Progress Note   Patient arrives for eval of perc chole tube.   Her home health nurse noticed some blood on the bandage.   She says she is comfortable and has no complaints.   No sign of infection. The tube is in good repair.  There is small amount of granulation tissue at the skin, likely site of bleeding.  No bleeding or friability now.   We discussed it.  She has appointment for exchange in ~4 weeks.   Might consider capping at that time.   Signed,  Dulcy Fanny. Earleen Newport, DO

## 2020-07-14 ENCOUNTER — Inpatient Hospital Stay: Payer: Medicare Other

## 2020-07-14 ENCOUNTER — Inpatient Hospital Stay: Payer: Medicare Other | Attending: Hematology & Oncology

## 2020-07-14 ENCOUNTER — Inpatient Hospital Stay (HOSPITAL_BASED_OUTPATIENT_CLINIC_OR_DEPARTMENT_OTHER): Payer: Medicare Other | Admitting: Hematology & Oncology

## 2020-07-14 ENCOUNTER — Encounter: Payer: Self-pay | Admitting: Hematology & Oncology

## 2020-07-14 ENCOUNTER — Other Ambulatory Visit: Payer: Self-pay

## 2020-07-14 ENCOUNTER — Encounter: Payer: Self-pay | Admitting: *Deleted

## 2020-07-14 VITALS — BP 135/71 | HR 65 | Temp 97.8°F | Resp 18 | Wt 97.0 lb

## 2020-07-14 DIAGNOSIS — C259 Malignant neoplasm of pancreas, unspecified: Secondary | ICD-10-CM | POA: Insufficient documentation

## 2020-07-14 DIAGNOSIS — E034 Atrophy of thyroid (acquired): Secondary | ICD-10-CM

## 2020-07-14 DIAGNOSIS — R978 Other abnormal tumor markers: Secondary | ICD-10-CM | POA: Insufficient documentation

## 2020-07-14 LAB — CBC WITH DIFFERENTIAL (CANCER CENTER ONLY)
Abs Immature Granulocytes: 0.02 10*3/uL (ref 0.00–0.07)
Basophils Absolute: 0.1 10*3/uL (ref 0.0–0.1)
Basophils Relative: 1 %
Eosinophils Absolute: 0.2 10*3/uL (ref 0.0–0.5)
Eosinophils Relative: 2 %
HCT: 33.2 % — ABNORMAL LOW (ref 36.0–46.0)
Hemoglobin: 10.6 g/dL — ABNORMAL LOW (ref 12.0–15.0)
Immature Granulocytes: 0 %
Lymphocytes Relative: 9 %
Lymphs Abs: 0.7 10*3/uL (ref 0.7–4.0)
MCH: 33 pg (ref 26.0–34.0)
MCHC: 31.9 g/dL (ref 30.0–36.0)
MCV: 103.4 fL — ABNORMAL HIGH (ref 80.0–100.0)
Monocytes Absolute: 0.5 10*3/uL (ref 0.1–1.0)
Monocytes Relative: 6 %
Neutro Abs: 6 10*3/uL (ref 1.7–7.7)
Neutrophils Relative %: 82 %
Platelet Count: 189 10*3/uL (ref 150–400)
RBC: 3.21 MIL/uL — ABNORMAL LOW (ref 3.87–5.11)
RDW: 12.1 % (ref 11.5–15.5)
WBC Count: 7.4 10*3/uL (ref 4.0–10.5)
nRBC: 0 % (ref 0.0–0.2)

## 2020-07-14 LAB — CMP (CANCER CENTER ONLY)
ALT: 64 U/L — ABNORMAL HIGH (ref 0–44)
AST: 28 U/L (ref 15–41)
Albumin: 3.8 g/dL (ref 3.5–5.0)
Alkaline Phosphatase: 321 U/L — ABNORMAL HIGH (ref 38–126)
Anion gap: 8 (ref 5–15)
BUN: 35 mg/dL — ABNORMAL HIGH (ref 8–23)
CO2: 25 mmol/L (ref 22–32)
Calcium: 9.7 mg/dL (ref 8.9–10.3)
Chloride: 101 mmol/L (ref 98–111)
Creatinine: 1.5 mg/dL — ABNORMAL HIGH (ref 0.44–1.00)
GFR, Estimated: 33 mL/min — ABNORMAL LOW (ref >60)
Glucose, Bld: 178 mg/dL — ABNORMAL HIGH (ref 70–99)
Potassium: 4.7 mmol/L (ref 3.5–5.1)
Sodium: 134 mmol/L — ABNORMAL LOW (ref 135–145)
Total Bilirubin: 0.3 mg/dL (ref 0.3–1.2)
Total Protein: 7.9 g/dL (ref 6.5–8.1)

## 2020-07-14 LAB — PREALBUMIN: Prealbumin: 22.6 mg/dL (ref 18–38)

## 2020-07-14 NOTE — Progress Notes (Signed)
Patient will need an MRI scheduled. MRI with contrast was ordered. In the past patient has refused contrast CTs. Spoke with Dr Marin Olp about this. He states he spoke with patient about the need for contrast to properly assess for cancer recurrence. Will follow up later this week for scheduling.  Oncology Nurse Navigator Documentation  Oncology Nurse Navigator Flowsheets 07/14/2020  Abnormal Finding Date -  Diagnosis Status -  Planned Course of Treatment -  Phase of Treatment -  Radiation Actual Start Date: -  Radiation Expected End Date: -  Radiation Actual End Date: -  Navigator Follow Up Date: 07/16/2020  Navigator Follow Up Reason: Appointment Review  Navigation Complete Date: -  Navigator Location [facility]  Referral Date to RadOnc/MedOnc -  Navigator Encounter Type Appt/Treatment Plan Review  Telephone -  Patient Visit Type MedOnc  Treatment Phase Post-Tx Follow-up  Barriers/Navigation Needs Coordination of Care  Education -  Interventions None Required  Acuity Level 2-Minimal Needs (1-2 Barriers Identified)  Referrals -  Coordination of Care -  Education Method -  Support Groups/Services Friends and Family  Time Spent with Patient 30

## 2020-07-14 NOTE — Progress Notes (Signed)
Hematology and Oncology Follow Up Visit  Glenda King [id number] 1929-04-27 87 y.o. 07/14/2020   Principle Diagnosis:  Pancreatic cancer-localized  Current Therapy:        Status post radiosurgery-completed on 01/29/2020.     Interim History:  Glenda King is here today for follow-up.  Overall, she seems to be doing okay.  Her weight is holding steady.  She still has all of her cholecystotomy tubes in.  The real problem that we have is that I suspect that her cancer has recurred.  Her CA 19-9 is now up to 346.  Her last MRI was done back in December.  We probably need to get another one on her to see what is going on.  If we find that she has measurable disease, I am still not sure how aggressive we can be with her.  I think if we are going to treat her, the only thing that we could use would be gemcitabine.  She has had a better appetite.  She has had no diarrhea.  She is worried about her thyroid being low.  The last time her TSH was checked was back in October.  Her TSH was 0.84.  I think she sees her endocrinologist in a couple weeks.  She sees the surgeons in a week or so.  Again, I just wish they would take out her cholecystotomy tubes.  I really doubt that she will ever go to surgery to remove her gallbladder which does have quite a few stones.  Thankfully, there is no issues with pain.  Currently, her performance status is ECOG 2.  ce status is probably ECOG 2, at best.    Medications:  Allergies as of 07/14/2020      Reactions   Gadolinium Derivatives Other (See Comments)   Secondary to her stage IV kidney disease   Amoxicillin Other (See Comments)   UNKNOWN REACTION   Pollen Extract Other (See Comments)   Sneezing      Medication List       Accurate as of July 14, 2020 12:20 PM. If you have any questions, ask your nurse or doctor.        cholecalciferol 25 MCG (1000 UNIT) tablet Commonly known as: VITAMIN D3 Take 1,000 Units by mouth 2 (two)  times daily.   cyanocobalamin 1000 MCG tablet Take 1 tablet (1,000 mcg total) by mouth daily.   levETIRAcetam 500 MG tablet Commonly known as: KEPPRA Take 500 mg by mouth 2 (two) times daily.   levothyroxine 100 MCG tablet Commonly known as: SYNTHROID Take 100 mcg by mouth daily.   Melatonin 10 MG Caps Take 10 mg by mouth at bedtime.   metoprolol tartrate 25 MG tablet Commonly known as: LOPRESSOR Take 1 tablet (25 mg total) by mouth 2 (two) times daily.   pantoprazole 40 MG tablet Commonly known as: PROTONIX Take 1 tablet (40 mg total) by mouth daily.   sodium bicarbonate 650 MG tablet Take 650 mg by mouth 2 (two) times daily.       Allergies:  Allergies  Allergen Reactions  . Gadolinium Derivatives Other (See Comments)    Secondary to her stage IV kidney disease   . Amoxicillin Other (See Comments)    UNKNOWN REACTION     . Pollen Extract Other (See Comments)    Sneezing    Past Medical History, Surgical history, Social history, and Family History were reviewed and updated.  Review of Systems: Review of Systems  Constitutional: Positive for malaise/fatigue  and weight loss.  HENT: Negative.   Eyes: Negative.   Respiratory: Negative.   Cardiovascular: Negative.   Gastrointestinal: Negative.   Genitourinary: Negative.   Musculoskeletal: Negative.   Skin: Negative.   Neurological: Negative.   Endo/Heme/Allergies: Negative.   Psychiatric/Behavioral: Negative.      Physical Exam:  weight is 97 lb (44 kg). Her oral temperature is 97.8 F (36.6 C). Her blood pressure is 135/71 and her pulse is 65. Her respiration is 18 and oxygen saturation is 99%.   Wt Readings from Last 3 Encounters:  07/14/20 97 lb (44 kg)  06/21/20 94 lb 6.4 oz (42.8 kg)  05/24/20 94 lb 12 oz (43 kg)    Physical Exam Vitals reviewed.  Constitutional:      Comments: Glenda King is a thin white female.  She is not jaundiced.  She has symmetric muscle atrophy in upper and lower  extremities.  She has the cholecystostomy tube in the right upper quadrant.  There is no drainage in the bag.  There is no erythema or warmth at the entry site.  HENT:     Head: Normocephalic and atraumatic.  Eyes:     Pupils: Pupils are equal, round, and reactive to light.  Cardiovascular:     Rate and Rhythm: Normal rate and regular rhythm.     Heart sounds: Normal heart sounds.  Pulmonary:     Effort: Pulmonary effort is normal.     Breath sounds: Normal breath sounds.  Abdominal:     General: Bowel sounds are normal.     Palpations: Abdomen is soft.  Musculoskeletal:        General: No tenderness or deformity. Normal range of motion.     Cervical back: Normal range of motion.  Lymphadenopathy:     Cervical: No cervical adenopathy.  Skin:    General: Skin is warm and dry.     Findings: No erythema or rash.  Neurological:     Mental Status: She is alert and oriented to person, place, and time.  Psychiatric:        Behavior: Behavior normal.        Thought Content: Thought content normal.        Judgment: Judgment normal.      Lab Results  Component Value Date   WBC 7.4 07/14/2020   HGB 10.6 (L) 07/14/2020   HCT 33.2 (L) 07/14/2020   MCV 103.4 (H) 07/14/2020   PLT 189 07/14/2020   Lab Results  Component Value Date   FERRITIN 822 (H) 04/26/2020   IRON 53 04/26/2020   TIBC 203 (L) 04/26/2020   UIBC 150 04/26/2020   IRONPCTSAT 26 04/26/2020   Lab Results  Component Value Date   RETICCTPCT 1.1 04/03/2020   RBC 3.21 (L) 07/14/2020   No results found for: KPAFRELGTCHN, LAMBDASER, KAPLAMBRATIO No results found for: IGGSERUM, IGA, IGMSERUM No results found for: Will Bonnet, GAMS, MSPIKE, SPEI   Chemistry      Component Value Date/Time   NA 134 (L) 07/14/2020 1114   K 4.7 07/14/2020 1114   CL 101 07/14/2020 1114   CO2 25 07/14/2020 1114   BUN 35 (H) 07/14/2020 1114   CREATININE 1.50 (H) 07/14/2020 1114      Component  Value Date/Time   CALCIUM 9.7 07/14/2020 1114   ALKPHOS 321 (H) 07/14/2020 1114   AST 28 07/14/2020 1114   ALT 64 (H) 07/14/2020 1114   BILITOT 0.3 07/14/2020 1114  Impression and Plan: Glenda King is a very charming 87 yo white female with history of pancreatic cancer.  She was treated with stereotactic radiosurgery.  Rise in her CA 54-2 is certainly a problem.  Again I suspect she has recurrence.  We will go ahead with another MRI.  This time, we will see if contrast can be given to see if we cannot get more detail.  We are not going to be aggressive with her.  I just do not think we can be aggressive with her.  I will plan to get the MRI in about a week.  I would like to see her back in about 3 weeks.    [name], MD 2/2/202212:20 PM

## 2020-07-15 LAB — CANCER ANTIGEN 19-9: CA 19-9: 402 U/mL — ABNORMAL HIGH (ref 0–35)

## 2020-07-16 ENCOUNTER — Encounter: Payer: Self-pay | Admitting: *Deleted

## 2020-07-16 NOTE — Progress Notes (Signed)
Oncology Nurse Navigator Documentation  Oncology Nurse Navigator Flowsheets 07/16/2020  Abnormal Finding Date -  Diagnosis Status -  Planned Course of Treatment -  Phase of Treatment -  Radiation Actual Start Date: -  Radiation Expected End Date: -  Radiation Actual End Date: -  Navigator Follow Up Date: 07/26/2020  Navigator Follow Up Reason: Scan Review  Navigation Complete Date: -  Navigator Location [facility]  Referral Date to RadOnc/MedOnc -  Navigator Encounter Type Appt/Treatment Plan Review  Telephone -  Patient Visit Type MedOnc  Treatment Phase Post-Tx Follow-up  Barriers/Navigation Needs Coordination of Care  Education -  Interventions -  Acuity Level 2-Minimal Needs (1-2 Barriers Identified)  Referrals -  Coordination of Care -  Education Method -  Support Groups/Services Friends and Family  Time Spent with Patient 30

## 2020-07-23 ENCOUNTER — Ambulatory Visit (HOSPITAL_BASED_OUTPATIENT_CLINIC_OR_DEPARTMENT_OTHER)
Admission: RE | Admit: 2020-07-23 | Discharge: 2020-07-23 | Disposition: A | Discharge status: Home / Self Care | Payer: Medicare Other | Source: Ambulatory Visit | Attending: Interventional Radiology | Admitting: Interventional Radiology

## 2020-07-23 ENCOUNTER — Other Ambulatory Visit: Payer: Self-pay

## 2020-07-23 ENCOUNTER — Other Ambulatory Visit (HOSPITAL_COMMUNITY): Payer: Self-pay | Admitting: Interventional Radiology

## 2020-07-23 DIAGNOSIS — K8000 Calculus of gallbladder with acute cholecystitis without obstruction: Secondary | ICD-10-CM

## 2020-07-23 DIAGNOSIS — Q676 Pectus excavatum: Secondary | ICD-10-CM | POA: Insufficient documentation

## 2020-07-23 DIAGNOSIS — Z434 Encounter for attention to other artificial openings of digestive tract: Secondary | ICD-10-CM | POA: Insufficient documentation

## 2020-07-23 DIAGNOSIS — C259 Malignant neoplasm of pancreas, unspecified: Secondary | ICD-10-CM | POA: Diagnosis not present

## 2020-07-23 HISTORY — PX: IR EXCHANGE BILIARY DRAIN: IMG6046

## 2020-07-23 MED ORDER — LIDOCAINE HCL 1 % IJ SOLN
INTRAMUSCULAR | Status: AC
  Administered 2020-07-23: 2 mL
  Filled 2020-07-23: qty 20

## 2020-07-23 MED ORDER — IOHEXOL 300 MG/ML  SOLN
50.0000 mL | Freq: Once | INTRAMUSCULAR | Status: AC | PRN
  Administered 2020-07-23: 10 mL

## 2020-07-24 ENCOUNTER — Ambulatory Visit (HOSPITAL_BASED_OUTPATIENT_CLINIC_OR_DEPARTMENT_OTHER)
Admission: RE | Admit: 2020-07-24 | Discharge: 2020-07-24 | Disposition: A | Discharge status: Home / Self Care | Payer: Medicare Other | Source: Ambulatory Visit | Attending: Hematology & Oncology | Admitting: Hematology & Oncology

## 2020-07-24 DIAGNOSIS — Z434 Encounter for attention to other artificial openings of digestive tract: Secondary | ICD-10-CM | POA: Diagnosis not present

## 2020-07-24 DIAGNOSIS — C259 Malignant neoplasm of pancreas, unspecified: Secondary | ICD-10-CM

## 2020-07-26 ENCOUNTER — Encounter: Payer: Self-pay | Admitting: *Deleted

## 2020-07-26 NOTE — Progress Notes (Signed)
Oncology Nurse Navigator Documentation  Oncology Nurse Navigator Flowsheets 07/26/2020  Abnormal Finding Date -  Diagnosis Status -  Planned Course of Treatment -  Phase of Treatment -  Radiation Actual Start Date: -  Radiation Expected End Date: -  Radiation Actual End Date: -  Navigator Follow Up Date: 08/05/2020  Navigator Follow Up Reason: Follow-up Appointment  Navigation Complete Date: -  Navigator Location [facility]  Referral Date to RadOnc/MedOnc -  Navigator Encounter Type Scan Review  Telephone -  Patient Visit Type MedOnc  Treatment Phase Post-Tx Follow-up  Barriers/Navigation Needs Coordination of Care;Education  Education -  Interventions None Required  Acuity Level 2-Minimal Needs (1-2 Barriers Identified)  Referrals -  Coordination of Care -  Education Method -  Support Groups/Services Friends and Family  Time Spent with Patient 15

## 2020-07-27 ENCOUNTER — Telehealth: Payer: Self-pay

## 2020-07-27 NOTE — Telephone Encounter (Signed)
Called and informed patient of MRI results, she was very appreciative and denies any questions or concerns at this time

## 2020-07-27 NOTE — Telephone Encounter (Signed)
-----   Message from [name], MD sent at 07/26/2020  5:36 PM EST ----- Call - surprisingly, the MRI is still stable!!  No new growth!!  I am happy with this!!  Glenda King

## 2020-08-02 ENCOUNTER — Other Ambulatory Visit (HOSPITAL_COMMUNITY): Payer: Medicare Other

## 2020-08-03 ENCOUNTER — Other Ambulatory Visit (HOSPITAL_COMMUNITY): Payer: Self-pay | Admitting: Radiology

## 2020-08-03 ENCOUNTER — Telehealth: Payer: Self-pay

## 2020-08-03 DIAGNOSIS — K8000 Calculus of gallbladder with acute cholecystitis without obstruction: Secondary | ICD-10-CM

## 2020-08-03 NOTE — Telephone Encounter (Signed)
Per secure chat pt needs to r/s her 2/24 appt, moved it to 3/3 and advised her on vm to call if needed      Glenda King

## 2020-08-05 ENCOUNTER — Other Ambulatory Visit (HOSPITAL_COMMUNITY): Payer: Self-pay | Admitting: Radiology

## 2020-08-05 ENCOUNTER — Inpatient Hospital Stay: Payer: Medicare Other | Admitting: Hematology & Oncology

## 2020-08-05 ENCOUNTER — Inpatient Hospital Stay: Payer: Medicare Other

## 2020-08-05 ENCOUNTER — Other Ambulatory Visit: Payer: Self-pay

## 2020-08-05 ENCOUNTER — Ambulatory Visit (HOSPITAL_COMMUNITY)
Admission: RE | Admit: 2020-08-05 | Discharge: 2020-08-05 | Disposition: A | Discharge status: Home / Self Care | Payer: Medicare Other | Source: Ambulatory Visit | Attending: Radiology | Admitting: Radiology

## 2020-08-05 DIAGNOSIS — Z434 Encounter for attention to other artificial openings of digestive tract: Secondary | ICD-10-CM | POA: Insufficient documentation

## 2020-08-05 DIAGNOSIS — K8000 Calculus of gallbladder with acute cholecystitis without obstruction: Secondary | ICD-10-CM | POA: Diagnosis not present

## 2020-08-05 HISTORY — PX: IR EXCHANGE BILIARY DRAIN: IMG6046

## 2020-08-05 IMAGING — XA IR EXCHANGE BILARY DRAIN
3 series · 13 of 13 positions shown · non-contrast
Comparison: none

INDICATION: 89-year-old with history of pancreatic cancer and calculus
cholecystitis. Cholecystostomy tube was placed on [DATE].
Patient recently had the cholecystostomy tube exchanged. Patient is
complaining of discomfort at the drain and leaking when the catheter
is flushed.

[Series 2: fl - angio · 4 of 52 frames shown (1 of 2)]
[frame 8/52]
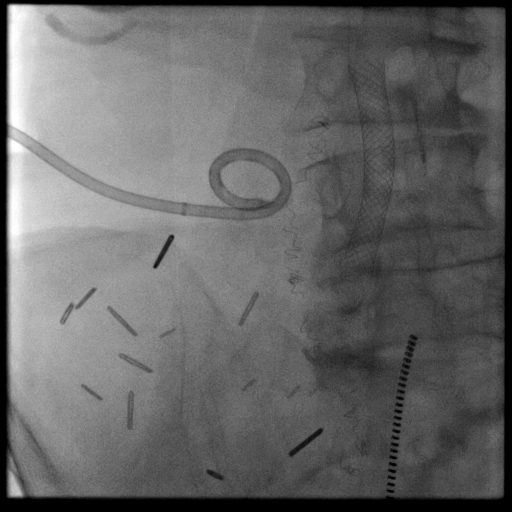
[frame 27/52]
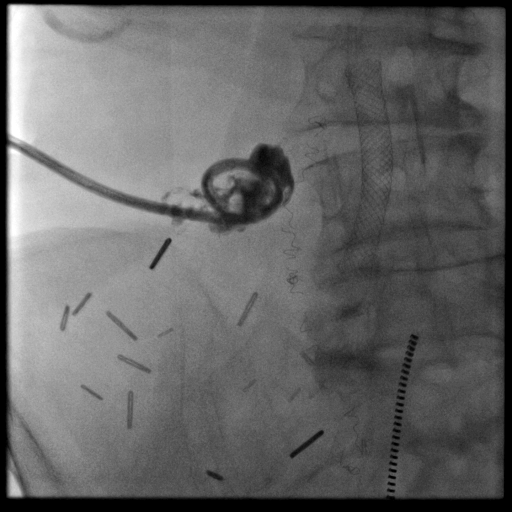
[frame 36/52]
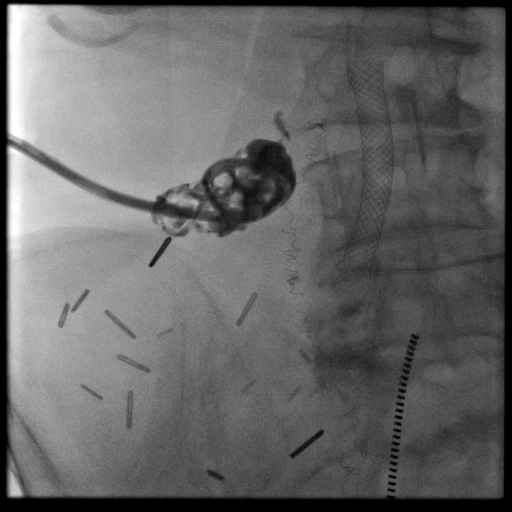
[frame 45/52]
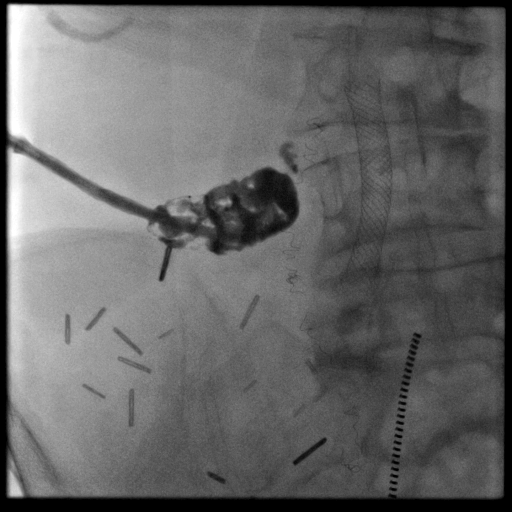

[Series 5: fl - angio · 4 of 24 frames shown (2 of 2)]
[frame 4/24]
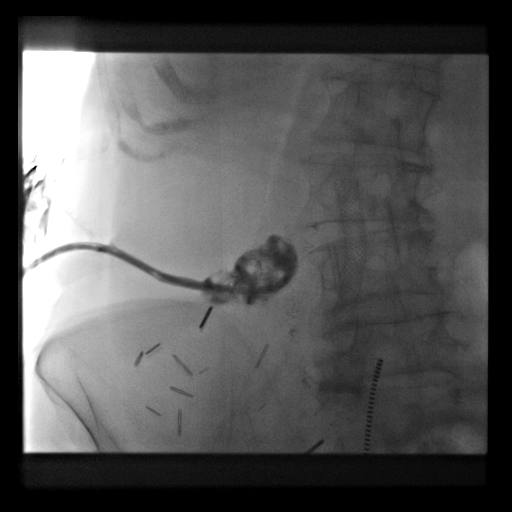
[frame 8/24]
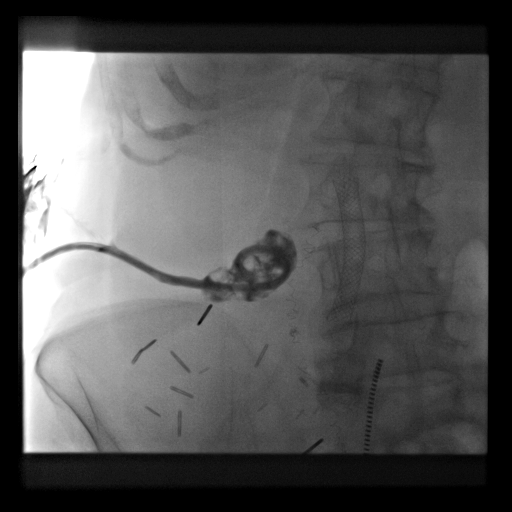
[frame 13/24]
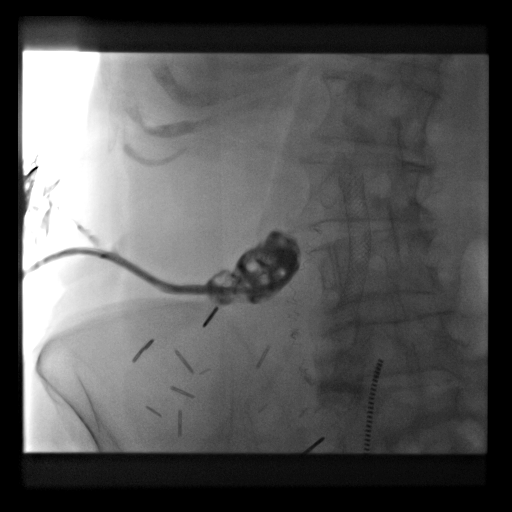
[frame 21/24]
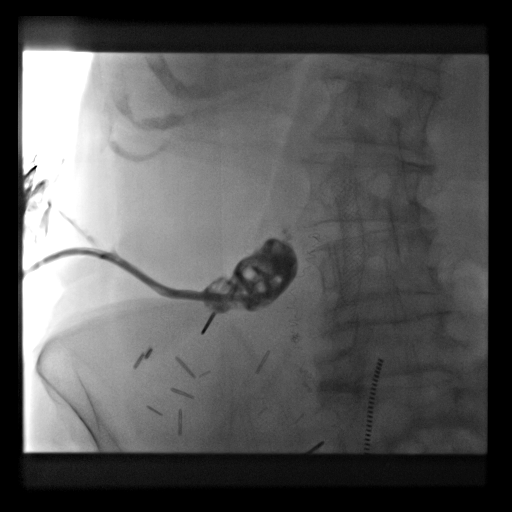

[Series 300: tube placements · 5 of 5 slices shown]
[im 1/5]
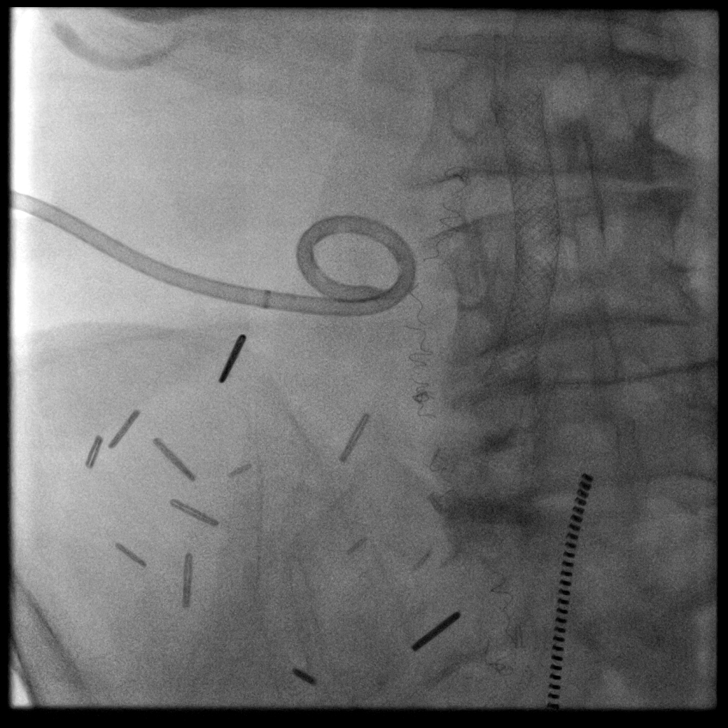
[im 2/5]
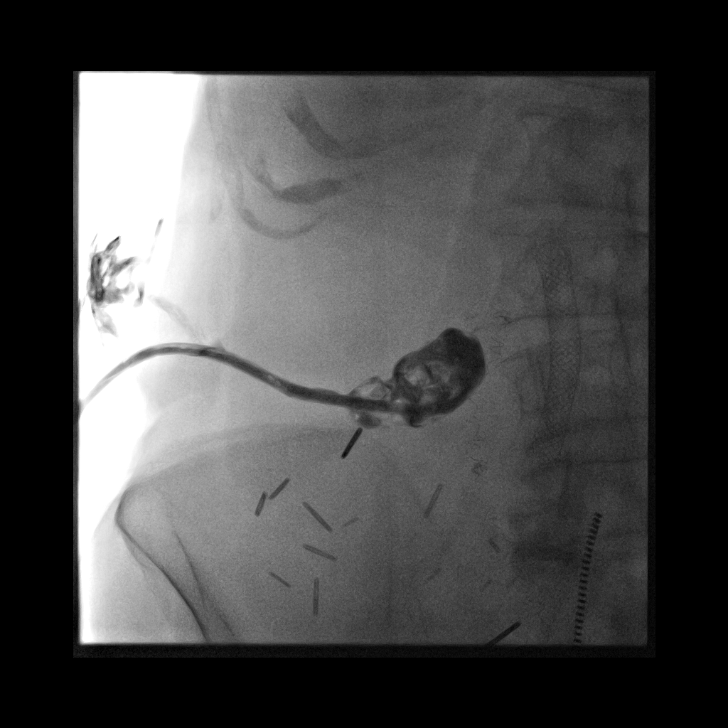
[im 3/5]
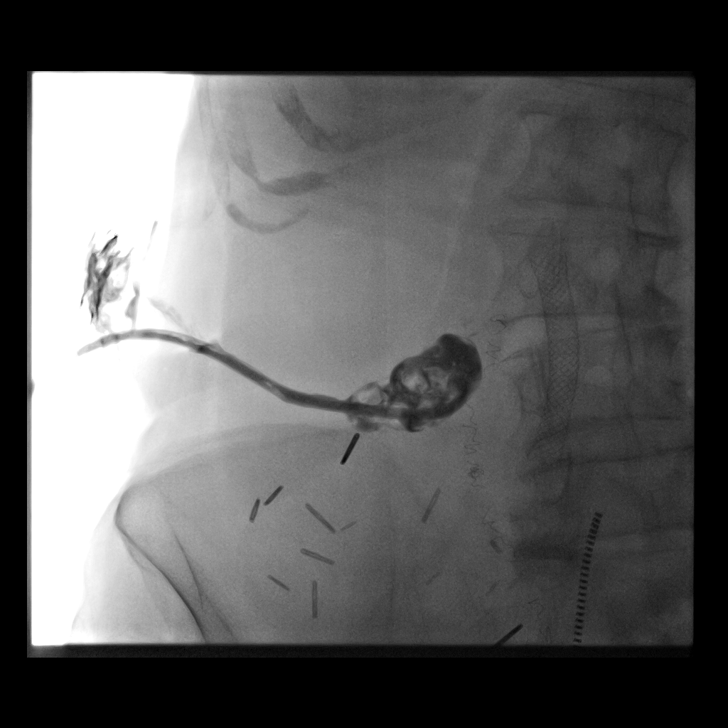
[im 4/5]
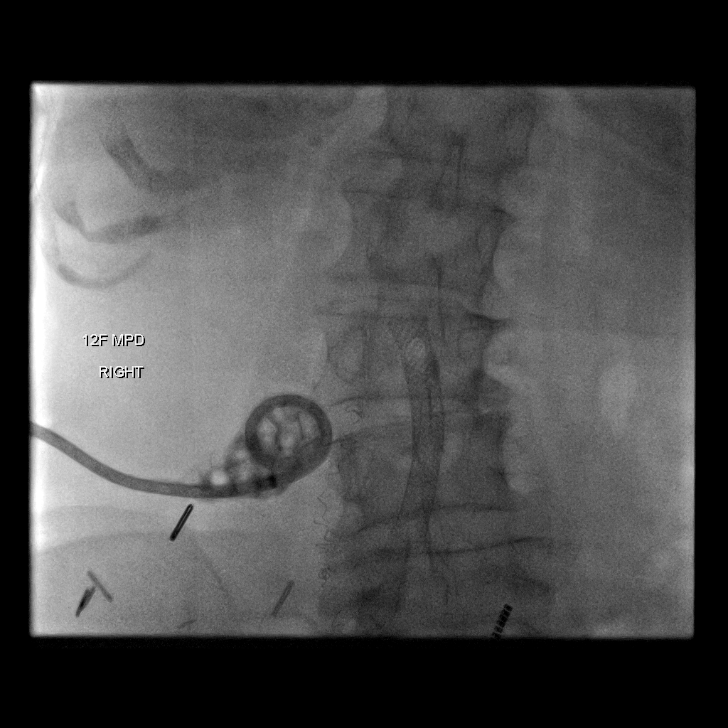
[im 5/5]
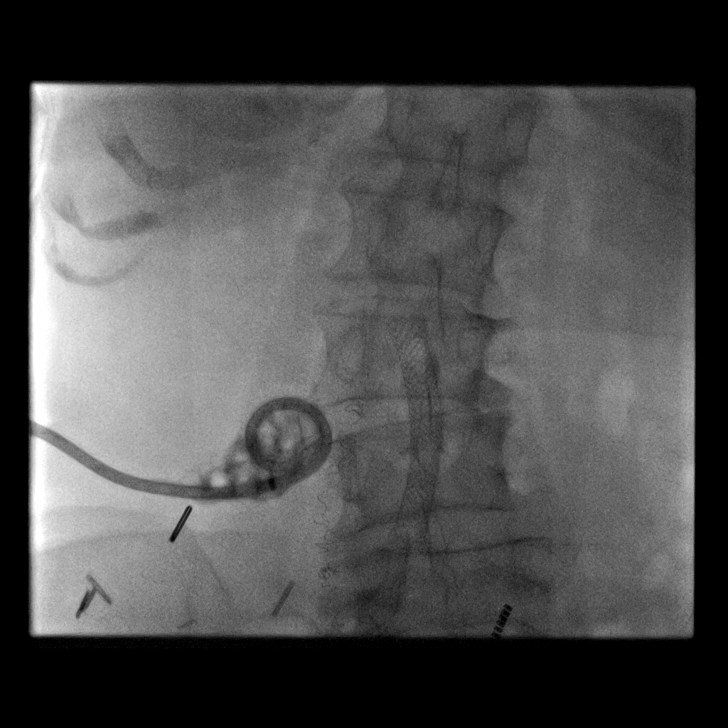

[13 of 13 positions shown; findings below may reference images not displayed]

EXAM:
1. CHOLANGIOGRAM THROUGH EXISTING CATHETER
2. EXCHANGE OF CHOLECYSTOSTOMY TUBE WITH FLUOROSCOPY

MEDICATIONS:
None

ANESTHESIA/SEDATION:
None

FLUOROSCOPY TIME:  Fluoroscopy Time: 42 seconds, 2 mGy

CONTRAST:  10 mL Omnipaque 300

COMPLICATIONS:
None immediate.

PROCEDURE:
Patient was placed supine on the interventional table. The tube was
injected with contrast. After review of the images and examination
of the tube, decided to exchange the percutaneous catheter.

The procedure was explained to the patient. The risks and benefits
of the procedure were discussed and the patient's questions were
addressed. Informed consent was obtained from the patient. Right
abdomen and existing catheter were prepped and draped in sterile
fashion. Maximal barrier sterile technique was utilized including
caps, mask, sterile gowns, sterile gloves, sterile drape, hand
hygiene and skin antiseptic. Catheter was cut and removed over a
Bentson wire. New 12 French multipurpose drain was advanced over the
wire and formed in the gallbladder. Skin was anesthetized using 1%
lidocaine. Catheter was sutured to skin and attached to a gravity
bag.
FINDINGS: Cholecystostomy tube is well positioned in the gallbladder. The
gallbladder has multiple filling defects compatible with stones.
Patient has a metallic biliary stent present. Minor filling of the
cystic duct but no filling of the common bile duct. Contrast
refluxes around the catheter to the skin site. The existing catheter
is quite stiff and causing discomfort to the patient, therefore,
decided to exchange the catheter. New 12 French catheter is well
positioned within the stone filled gallbladder.
IMPRESSION: 1. Successful exchange of the cholecystostomy tube.
2. Cholecystostomy tube is well positioned within the stone filled
gallbladder. Explained to patient that it is not unexpected to have
some leakage around the catheter during flushing due to her stones.
Instructed the patient to contact us if the catheter starts to leak
when she is not flushing it. Otherwise, plan for routine exchange in
approximately 12 weeks.

## 2020-08-05 MED ORDER — IOHEXOL 300 MG/ML  SOLN
50.0000 mL | Freq: Once | INTRAMUSCULAR | Status: AC | PRN
  Administered 2020-08-05: 10 mL

## 2020-08-05 MED ORDER — LIDOCAINE HCL 1 % IJ SOLN
INTRAMUSCULAR | Status: AC | PRN
  Administered 2020-08-05: 5 mL via INTRADERMAL

## 2020-08-05 MED ORDER — LIDOCAINE HCL 1 % IJ SOLN
INTRAMUSCULAR | Status: AC
  Filled 2020-08-05: qty 20

## 2020-08-05 NOTE — Procedures (Signed)
Interventional Radiology Procedure:   Indications: Leaking and discomfort at drain site  Procedure: Cholecystostomy tube injection and exchange  Findings: Stone filled gallbladder with leakage around the drain.  New 12 Fr drain placed.  Complications: None     EBL: None  Plan: Routine exchange in approximately 12 weeks   Brandom Kerwin R. Anselm Pancoast, MD  Pager: [phone]

## 2020-08-12 ENCOUNTER — Other Ambulatory Visit: Payer: Self-pay

## 2020-08-12 ENCOUNTER — Encounter: Payer: Self-pay | Admitting: Hematology & Oncology

## 2020-08-12 ENCOUNTER — Inpatient Hospital Stay (HOSPITAL_BASED_OUTPATIENT_CLINIC_OR_DEPARTMENT_OTHER): Payer: Medicare Other | Admitting: Hematology & Oncology

## 2020-08-12 ENCOUNTER — Inpatient Hospital Stay: Payer: Medicare Other | Attending: Hematology & Oncology

## 2020-08-12 VITALS — BP 139/66 | HR 75 | Temp 98.0°F | Resp 20 | Wt 98.8 lb

## 2020-08-12 DIAGNOSIS — R978 Other abnormal tumor markers: Secondary | ICD-10-CM | POA: Insufficient documentation

## 2020-08-12 DIAGNOSIS — C250 Malignant neoplasm of head of pancreas: Secondary | ICD-10-CM | POA: Diagnosis present

## 2020-08-12 DIAGNOSIS — C259 Malignant neoplasm of pancreas, unspecified: Secondary | ICD-10-CM | POA: Diagnosis not present

## 2020-08-12 DIAGNOSIS — E034 Atrophy of thyroid (acquired): Secondary | ICD-10-CM

## 2020-08-12 DIAGNOSIS — E033 Postinfectious hypothyroidism: Secondary | ICD-10-CM | POA: Diagnosis not present

## 2020-08-12 DIAGNOSIS — N183 Chronic kidney disease, stage 3 unspecified: Secondary | ICD-10-CM

## 2020-08-12 DIAGNOSIS — Z79899 Other long term (current) drug therapy: Secondary | ICD-10-CM | POA: Diagnosis not present

## 2020-08-12 DIAGNOSIS — R7989 Other specified abnormal findings of blood chemistry: Secondary | ICD-10-CM | POA: Diagnosis not present

## 2020-08-12 DIAGNOSIS — E860 Dehydration: Secondary | ICD-10-CM | POA: Insufficient documentation

## 2020-08-12 DIAGNOSIS — D631 Anemia in chronic kidney disease: Secondary | ICD-10-CM

## 2020-08-12 LAB — CBC WITH DIFFERENTIAL (CANCER CENTER ONLY)
Abs Immature Granulocytes: 0.05 10*3/uL (ref 0.00–0.07)
Basophils Absolute: 0 10*3/uL (ref 0.0–0.1)
Basophils Relative: 1 %
Eosinophils Absolute: 0.1 10*3/uL (ref 0.0–0.5)
Eosinophils Relative: 2 %
HCT: 29.7 % — ABNORMAL LOW (ref 36.0–46.0)
Hemoglobin: 9.6 g/dL — ABNORMAL LOW (ref 12.0–15.0)
Immature Granulocytes: 1 %
Lymphocytes Relative: 10 %
Lymphs Abs: 0.7 10*3/uL (ref 0.7–4.0)
MCH: 32.5 pg (ref 26.0–34.0)
MCHC: 32.3 g/dL (ref 30.0–36.0)
MCV: 100.7 fL — ABNORMAL HIGH (ref 80.0–100.0)
Monocytes Absolute: 0.5 10*3/uL (ref 0.1–1.0)
Monocytes Relative: 7 %
Neutro Abs: 5.6 10*3/uL (ref 1.7–7.7)
Neutrophils Relative %: 79 %
Platelet Count: 179 10*3/uL (ref 150–400)
RBC: 2.95 MIL/uL — ABNORMAL LOW (ref 3.87–5.11)
RDW: 11.9 % (ref 11.5–15.5)
WBC Count: 6.9 10*3/uL (ref 4.0–10.5)
nRBC: 0 % (ref 0.0–0.2)

## 2020-08-12 LAB — CMP (CANCER CENTER ONLY)
ALT: 110 U/L — ABNORMAL HIGH (ref 0–44)
AST: 59 U/L — ABNORMAL HIGH (ref 15–41)
Albumin: 3.6 g/dL (ref 3.5–5.0)
Alkaline Phosphatase: 369 U/L — ABNORMAL HIGH (ref 38–126)
Anion gap: 8 (ref 5–15)
BUN: 41 mg/dL — ABNORMAL HIGH (ref 8–23)
CO2: 28 mmol/L (ref 22–32)
Calcium: 9.4 mg/dL (ref 8.9–10.3)
Chloride: 96 mmol/L — ABNORMAL LOW (ref 98–111)
Creatinine: 1.55 mg/dL — ABNORMAL HIGH (ref 0.44–1.00)
GFR, Estimated: 32 mL/min — ABNORMAL LOW (ref >60)
Glucose, Bld: 260 mg/dL — ABNORMAL HIGH (ref 70–99)
Potassium: 4.2 mmol/L (ref 3.5–5.1)
Sodium: 132 mmol/L — ABNORMAL LOW (ref 135–145)
Total Bilirubin: 0.3 mg/dL (ref 0.3–1.2)
Total Protein: 7.7 g/dL (ref 6.5–8.1)

## 2020-08-12 NOTE — Progress Notes (Signed)
Hematology and Oncology Follow Up Visit  Glenda King [id number] [birth year] 87 y.o. 08/12/2020   Principle Diagnosis:  Pancreatic cancer-localized  Current Therapy:        Status post radiosurgery-completed on 01/29/2020.     Interim History:  Glenda King is here today for follow-up.  I just do not like how things are going with Glenda King.  I have noted that Glenda King CA 19-9 keeps going up.  We last saw Glenda King, it was 420.  We did go ahead and do an MRI of Glenda King abdomen.  This was done on 07/24/2020.  The MRI will did not show any obvious progressive disease.  Glenda King had the pancreatic head mass measuring 2.9 x 2.8 cm which was stable.  Glenda King had some mild intrahepatic biliary dilatation.  Glenda King had the cholecystotomy tube in place.  There is no ascites.  Glenda King had no lymphadenopathy.  I have to believe that Glenda King does have progression of Glenda King disease.  Glenda King certainly could have peritoneal spread that we just are not seen on Glenda King MRI.  Glenda King LFTs are also going up a little bit.  I do not know if this is malignant related or if this is from the recent cholecystotomy tube exchange.  Glenda King is gained a little bit of weight.  Glenda King is 98 pounds.  I think that we are had a "juncture" we will get Glenda King to think about treatment.  Again, I know this is a little unusual in that we do not see any obvious spread of disease but everything just tells me that Glenda King does have active disease.  Glenda King does seem to be a little bit hypovolemic.  Glenda King said that Glenda King is drinking and eating okay.  There is no problems with pain.  Any pain that Glenda King has is related to the cholecystotomy tube.  Glenda King has had no fever.  Glenda King has had no nausea or vomiting.  There is been no diarrhea.  Overall, I would say Glenda King performance status is ECOG 2.     Medications:  Allergies as of 08/12/2020      Reactions   Gadolinium Derivatives Other (See Comments)   Secondary to Glenda King stage IV kidney disease   Amoxicillin Other (See Comments)   UNKNOWN REACTION    Pollen Extract Other (See Comments)   Sneezing      Medication List       Accurate as of August 12, 2020  6:04 PM. If you have any questions, ask your nurse or doctor.        STOP taking these medications   pantoprazole 40 MG tablet Commonly known as: PROTONIX Stopped by: [name], MD     TAKE these medications   cholecalciferol 25 MCG (1000 UNIT) tablet Commonly known as: VITAMIN D3 Take 1,000 Units by mouth 2 (two) times daily.   cyanocobalamin 1000 MCG tablet Take 1 tablet (1,000 mcg total) by mouth daily.   levETIRAcetam 500 MG tablet Commonly known as: KEPPRA Take 500 mg by mouth 2 (two) times daily.   levothyroxine 100 MCG tablet Commonly known as: SYNTHROID Take 100 mcg by mouth daily.   Melatonin 10 MG Caps Take 10 mg by mouth at bedtime.   metoprolol tartrate 25 MG tablet Commonly known as: LOPRESSOR Take 1 tablet (25 mg total) by mouth 2 (two) times daily.   sodium bicarbonate 650 MG tablet Take 650 mg by mouth daily.       Allergies:  Allergies  Allergen Reactions  . Gadolinium Derivatives Other (  See Comments)    Secondary to Glenda King stage IV kidney disease   . Amoxicillin Other (See Comments)    UNKNOWN REACTION     . Pollen Extract Other (See Comments)    Sneezing    Past Medical History, Surgical history, Social history, and Family History were reviewed and updated.  Review of Systems: Review of Systems  Constitutional: Positive for malaise/fatigue and weight loss.  HENT: Negative.   Eyes: Negative.   Respiratory: Negative.   Cardiovascular: Negative.   Gastrointestinal: Negative.   Genitourinary: Negative.   Musculoskeletal: Negative.   Skin: Negative.   Neurological: Negative.   Endo/Heme/Allergies: Negative.   Psychiatric/Behavioral: Negative.      Physical Exam:  weight is 98 lb 12.8 oz (44.8 kg). Glenda King oral temperature is 98 F (36.7 C). Glenda King blood pressure is 139/66 and Glenda King pulse is 75. Glenda King respiration is 20 and  oxygen saturation is 98%.   Wt Readings from Last 3 Encounters:  08/12/20 98 lb 12.8 oz (44.8 kg)  07/14/20 97 lb (44 kg)  06/21/20 94 lb 6.4 oz (42.8 kg)    Physical Exam Vitals reviewed.  Constitutional:      Comments: Glenda King is a thin white female.  Glenda King is not jaundiced.  Glenda King has symmetric muscle atrophy in upper and lower extremities.  Glenda King has the cholecystostomy tube in the right upper quadrant.  There is no drainage in the bag.  There is no erythema or warmth at the entry site.  HENT:     Head: Normocephalic and atraumatic.  Eyes:     Pupils: Pupils are equal, round, and reactive to light.  Cardiovascular:     Rate and Rhythm: Normal rate and regular rhythm.     Heart sounds: Normal heart sounds.  Pulmonary:     Effort: Pulmonary effort is normal.     Breath sounds: Normal breath sounds.  Abdominal:     General: Bowel sounds are normal.     Palpations: Abdomen is soft.  Musculoskeletal:        General: No tenderness or deformity. Normal range of motion.     Cervical back: Normal range of motion.  Lymphadenopathy:     Cervical: No cervical adenopathy.  Skin:    General: Skin is warm and dry.     Findings: No erythema or rash.  Neurological:     Mental Status: Glenda King is alert and oriented to person, place, and time.  Psychiatric:        Behavior: Behavior normal.        Thought Content: Thought content normal.        Judgment: Judgment normal.      Lab Results  Component Value Date   WBC 6.9 08/12/2020   HGB 9.6 (L) 08/12/2020   HCT 29.7 (L) 08/12/2020   MCV 100.7 (H) 08/12/2020   PLT 179 08/12/2020   Lab Results  Component Value Date   FERRITIN 822 (H) 04/26/2020   IRON 53 04/26/2020   TIBC 203 (L) 04/26/2020   UIBC 150 04/26/2020   IRONPCTSAT 26 04/26/2020   Lab Results  Component Value Date   RETICCTPCT 1.1 04/03/2020   RBC 2.95 (L) 08/12/2020   No results found for: KPAFRELGTCHN, LAMBDASER, KAPLAMBRATIO No results found for: IGGSERUM, IGA,  IGMSERUM No results found for: TOTALPROTELP, ALBUMINELP, A1GS, A2GS, BETS, BETA2SER, GAMS, MSPIKE, SPEI   Chemistry      Component Value Date/Time   NA 132 (L) 08/12/2020 1511   K 4.2 08/12/2020 1511  CL 96 (L) 08/12/2020 1511   CO2 28 08/12/2020 1511   BUN 41 (H) 08/12/2020 1511   CREATININE 1.55 (H) 08/12/2020 1511      Component Value Date/Time   CALCIUM 9.4 08/12/2020 1511   ALKPHOS 369 (H) 08/12/2020 1511   AST 59 (H) 08/12/2020 1511   ALT 110 (H) 08/12/2020 1511   BILITOT 0.3 08/12/2020 1511       Impression and Plan: Glenda King is a very charming 87 yo white female with history of pancreatic cancer.  Glenda King was treated with stereotactic radiosurgery.  I will try Glenda King on single agent Xeloda.  We will see how Glenda King might do with this.  I talked to Glenda King and Glenda King friend about this.  I explained to Glenda King what Xeloda is.  Glenda King will get reduced dose of Xeloda.  Glenda King will take it twice a day for 14 days on and 7 days off.  Talked Glenda King about the side effects.  Explained to Glenda King the possibility of mouth sores, diarrhea, fatigue, and the redness and peeling of Glenda King hands and feet.  I told Glenda King to use moisturizer on Glenda King hands and feet.  I told Glenda King to rinse with water and baking soda to try to help prevent mouth sores.  Glenda King blood counts may go down.  Glenda King understands all this.  Glenda King agrees to proceed.  We will hopefully try to get the Xeloda started in a week.  I would like to see Glenda King back in about 3 weeks time to see how Glenda King is doing.  I think that we will have to follow Glenda King blood work to see how things are going.     [name], MD 3/3/20226:04 PM

## 2020-08-13 ENCOUNTER — Telehealth: Payer: Self-pay | Admitting: *Deleted

## 2020-08-13 ENCOUNTER — Encounter: Payer: Self-pay | Admitting: *Deleted

## 2020-08-13 ENCOUNTER — Telehealth: Payer: Self-pay | Admitting: Pharmacist

## 2020-08-13 ENCOUNTER — Other Ambulatory Visit: Payer: Self-pay | Admitting: Hematology & Oncology

## 2020-08-13 ENCOUNTER — Telehealth: Payer: Self-pay | Admitting: Pharmacy Technician

## 2020-08-13 DIAGNOSIS — C259 Malignant neoplasm of pancreas, unspecified: Secondary | ICD-10-CM

## 2020-08-13 LAB — CANCER ANTIGEN 19-9: CA 19-9: 513 U/mL — ABNORMAL HIGH (ref 0–35)

## 2020-08-13 LAB — TSH: TSH: 3.399 u[IU]/mL (ref 0.308–3.960)

## 2020-08-13 MED ORDER — CAPECITABINE 500 MG PO TABS: 1500.0000 mg | ORAL_TABLET | Freq: Two times a day (BID) | ORAL | 4 refills | Status: DC

## 2020-08-13 MED ORDER — CAPECITABINE 500 MG PO TABS: 1000.0000 mg | ORAL_TABLET | Freq: Two times a day (BID) | ORAL | 4 refills | Status: DC

## 2020-08-13 NOTE — Progress Notes (Signed)
Appointment reviewed. Patient will begin treatment with Xeloda. Prescription has been sent. Left voicemail for patient instructing her on timeline expectations for new chemo prescription. Encouraged her to reach out with any questions or concerns she may have.   Oncology Nurse Navigator Documentation  Oncology Nurse Navigator Flowsheets 08/13/2020  Abnormal Finding Date -  Diagnosis Status -  Planned Course of Treatment Chemotherapy  Phase of Treatment Chemo  Chemotherapy Pending- Reason: Authorization  Radiation Actual Start Date: -  Radiation Expected End Date: -  Radiation Actual End Date: -  Navigator Follow Up Date: 08/17/2020  Navigator Follow Up Reason: Other:  Navigation Complete Date: -  Navigator Location [facility]  Referral Date to RadOnc/MedOnc -  Navigator Encounter Type Appt/Treatment Plan Review;Telephone  Telephone Education;Medication Assistance  Patient Visit Type MedOnc  Treatment Phase Pre-Tx/Tx Discussion  Barriers/Navigation Needs Coordination of Care;Education  Education Other  Interventions Education;Psycho-Social Support  Acuity Level 2-Minimal Needs (1-2 Barriers Identified)  Referrals -  Coordination of Care -  Education Method Verbal  Support Groups/Services Friends and Family  Time Spent with Patient 30

## 2020-08-13 NOTE — Telephone Encounter (Signed)
Oral Oncology Pharmacist Encounter  Received new prescription for Xeloda (capecitabine) for the treatment of pancreatic cancer previously treated with stereotatic radiosurgery, planned duration until disease progression or unacceptable drug toxicity.  CMP from 08/12/20 assessed, SCr 1.55mg /dL, CrCl ~54m/min. Patient has known CKD. We would normally dose reduced Xeloda by 25% for CrCl 30-20mL/min and per the manufacturer avoid use in patients with a CrCl <26mL/min. I found a case report of Xeloda being given successfully in patients with CrCl <30, with dose modifications (sometimes reducing up to 50%) and increased monitoring. Reach out to Dr. Marin Olp to discuss. Plan to reduce to 1000mg  (~680mg /m2) twice daily for 14 days on/7off, and increase the dose if tolerated.  Case Report: Trisha Mangle KD, Alger Simons, Latcha S. A retrospective observational study on the use of capecitabine in patients with severe renal impairment (GFR <30 mL/min) and end stage renal disease on hemodialysis. J Oncol Pharm Pract. 2012;18(1):140-147. doi:10.1177/1078155210390255  Current medication list in Epic reviewed, no DDIs with capecitabine identified.   Evaluated chart and no patient barriers to medication adherence identified.   Prescription has been e-scribed to the [facility] for benefits analysis and approval.  Oral Oncology Clinic will continue to follow for insurance authorization, copayment issues, initial counseling and start date.  Patient agreed to treatment on 08/13/20 per MD documentation.  Darl Pikes, PharmD, BCPS, BCOP, CPP Hematology/Oncology Clinical Pharmacist Practitioner [facility] [phone]  08/13/2020 1:23 PM

## 2020-08-13 NOTE — Telephone Encounter (Signed)
Per los 08/12/20 lvm of upcoming appts and mailed calendar

## 2020-08-13 NOTE — Addendum Note (Signed)
Addended by: Burney Gauze R on: 08/13/2020 09:40 AM   Modules accepted: Orders

## 2020-08-13 NOTE — Telephone Encounter (Signed)
Oral Oncology Patient Advocate Encounter  After completing a benefits investigation, prior authorization for Xeloda is not required at this time through Medicare B.  Patient's copay is $18.96.  Will apply for grant assistance to help cover out of pocket expense for patient.  [place] Patient [place] Phone [phone] Fax [phone] 08/13/2020 2:06 PM

## 2020-08-16 ENCOUNTER — Telehealth: Payer: Self-pay | Admitting: Pharmacy Technician

## 2020-08-16 NOTE — Telephone Encounter (Signed)
Oral Oncology Patient Advocate Encounter   Was successful in securing patient a $4,500 grant from [employer] to provide copayment coverage for Xeloda.  This will keep the out of pocket expense at $0.     I have spoken with the patient.    The billing information is as follows and has been shared with [place].   Member ID: [id number] Group ID: [id number] RxBin: [id number] PCN: [id number] Dates of Eligibility: 08/16/20 through 08/16/21    Fund name:  Pancreatic [place] Patient [place] Phone [phone] Fax [phone] 08/16/2020 3:29 PM

## 2020-08-17 MED FILL — XELODA 500 MG TABLET: 500 | 21 days supply | Qty: 56 | Fill #0

## 2020-08-17 NOTE — Telephone Encounter (Signed)
Oral Oncology Patient Advocate Encounter  I spoke with patient yesterday to set up delivery of Xeloda.  Address verified for shipment.  Xeloda will be filled through [facility] and mailed 3/8 for delivery 3/9.    [facility] will call 7-10 days before next refill is due to complete adherence call and set up delivery of medication.     [place] Patient [place] Phone [phone] Fax [phone] 08/17/2020 12:08 PM

## 2020-08-18 ENCOUNTER — Encounter: Payer: Self-pay | Admitting: *Deleted

## 2020-08-18 NOTE — Telephone Encounter (Signed)
Oral Chemotherapy Pharmacist Encounter  Ms. Poss's Xeloda will be delivered today. She plans to get started tomorrow 08/19/20.  Patient Education I spoke with patient for overview of new oral chemotherapy medication: Xeloda (capecitabine) for the treatment of pancreatic cancer previously treated with stereotatic radiosurgery, planned duration until disease progression or unacceptable drug toxicity.   Pt is doing well. Counseled patient on administration, dosing, side effects, monitoring, drug-food interactions, safe handling, storage, and disposal. Patient will take 2 tablets (1,000 mg total) by mouth 2 (two) times daily after a meal. Take for 14 days, then hold for 7 days. Repeat every 21 days.  Side effects include but not limited to: diarrhea, hand-foot syndrome, N/V, decrease wbc/hgb/plt, fatigue, .    Reviewed with patient importance of keeping a medication schedule and plan for any missed doses.  After discussion with patient no patient barriers to medication adherence identified.   Ms. Soler voiced understanding and appreciation. All questions answered. Medication handout and calendar provided.  Provided patient with Oral [facility] phone number. Patient knows to call the office with questions or concerns. Oral Chemotherapy Navigation Clinic will continue to follow.  Darl Pikes, PharmD, BCPS, BCOP, CPP Hematology/Oncology Clinical Pharmacist Practitioner [facility] [phone]  08/18/2020 12:40 PM

## 2020-08-18 NOTE — Progress Notes (Signed)
Reached out and spoke to patient. She is due to receive her Xeloda today or tomorrow. She states she plans to start the Xeloda the morning after she receives the medication. She has spoken to [place] and feels well prepared and at this time has no questions. She continues to drink Boost and states she feels like she's gaining weight. I told her I would call on Monday to see how she's doing on the Xeloda. She knows to call the office with any questions or concerns.  Oncology Nurse Navigator Documentation  Oncology Nurse Navigator Flowsheets 08/18/2020  Abnormal Finding Date -  Diagnosis Status -  Planned Course of Treatment -  Phase of Treatment -  Chemotherapy Pending- Reason: -  Radiation Actual Start Date: -  Radiation Expected End Date: -  Radiation Actual End Date: -  Navigator Follow Up Date: 08/23/2020  Navigator Follow Up Reason: Symptom Management  Navigation Complete Date: -  Navigator Location [facility]  Referral Date to RadOnc/MedOnc -  Navigator Encounter Type Telephone  Telephone Patient Update;Outgoing Call  Patient Visit Type MedOnc  Treatment Phase Pre-Tx/Tx Discussion  Barriers/Navigation Needs Coordination of Care;Education  Education Other  Interventions Education;Psycho-Social Support  Acuity Level 2-Minimal Needs (1-2 Barriers Identified)  Referrals -  Coordination of Care -  Education Method Verbal  Support Groups/Services Friends and Family  Time Spent with Patient 40

## 2020-08-23 ENCOUNTER — Encounter: Payer: Self-pay | Admitting: *Deleted

## 2020-08-23 NOTE — Progress Notes (Signed)
Patient is on day 5 of her first cycle of Xeloda. She is doing well. She had one episode of diarrhea where she used imodium which resolved symptom. She's had no change in her baseline appetite, energy, and denies any n/v. She is content that she has had sop few symptoms. Reviewed that she may still develop side effects and to notify us with any questions or concerns. She understood.   Oncology Nurse Navigator Documentation  Oncology Nurse Navigator Flowsheets 08/23/2020  Abnormal Finding Date -  Diagnosis Status -  Planned Course of Treatment -  Phase of Treatment Chemo  Chemotherapy Pending- Reason: -  Chemotherapy Actual Start Date: 08/19/2020  Radiation Actual Start Date: -  Radiation Expected End Date: -  Radiation Actual End Date: -  Navigator Follow Up Date: 08/30/2020  Navigator Follow Up Reason: Follow-up Appointment  Navigation Complete Date: -  Navigator Location [facility]  Referral Date to RadOnc/MedOnc -  Navigator Encounter Type Telephone  Telephone Patient Update;Outgoing Call  Treatment Initiated Date 08/19/2020  Patient Visit Type MedOnc  Treatment Phase Active Tx  Barriers/Navigation Needs Coordination of Care;Education  Education -  Interventions Other;Education;Psycho-Social Support  Acuity Level 2-Minimal Needs (1-2 Barriers Identified)  Referrals -  Coordination of Care -  Education Method Verbal  Support Groups/Services Friends and Family  Time Spent with Patient 30

## 2020-08-30 ENCOUNTER — Encounter: Payer: Self-pay | Admitting: Hematology & Oncology

## 2020-08-30 ENCOUNTER — Inpatient Hospital Stay: Payer: Medicare Other

## 2020-08-30 ENCOUNTER — Other Ambulatory Visit: Payer: Self-pay

## 2020-08-30 ENCOUNTER — Inpatient Hospital Stay (HOSPITAL_BASED_OUTPATIENT_CLINIC_OR_DEPARTMENT_OTHER): Payer: Medicare Other | Admitting: Hematology & Oncology

## 2020-08-30 VITALS — BP 112/57 | HR 57 | Temp 97.5°F | Resp 20 | Wt 94.1 lb

## 2020-08-30 VITALS — BP 109/55 | HR 56

## 2020-08-30 DIAGNOSIS — E033 Postinfectious hypothyroidism: Secondary | ICD-10-CM

## 2020-08-30 DIAGNOSIS — C259 Malignant neoplasm of pancreas, unspecified: Secondary | ICD-10-CM | POA: Diagnosis not present

## 2020-08-30 DIAGNOSIS — E860 Dehydration: Secondary | ICD-10-CM

## 2020-08-30 DIAGNOSIS — D631 Anemia in chronic kidney disease: Secondary | ICD-10-CM

## 2020-08-30 DIAGNOSIS — N183 Chronic kidney disease, stage 3 unspecified: Secondary | ICD-10-CM

## 2020-08-30 DIAGNOSIS — C250 Malignant neoplasm of head of pancreas: Secondary | ICD-10-CM | POA: Diagnosis not present

## 2020-08-30 LAB — RETICULOCYTES
Immature Retic Fract: 5.8 % (ref 2.3–15.9)
RBC.: 2.91 MIL/uL — ABNORMAL LOW (ref 3.87–5.11)
Retic Count, Absolute: 23.3 10*3/uL (ref 19.0–186.0)
Retic Ct Pct: 0.8 % (ref 0.4–3.1)

## 2020-08-30 LAB — CMP (CANCER CENTER ONLY)
ALT: 34 U/L (ref 0–44)
AST: 22 U/L (ref 15–41)
Albumin: 3.5 g/dL (ref 3.5–5.0)
Alkaline Phosphatase: 285 U/L — ABNORMAL HIGH (ref 38–126)
Anion gap: 10 (ref 5–15)
BUN: 60 mg/dL — ABNORMAL HIGH (ref 8–23)
CO2: 24 mmol/L (ref 22–32)
Calcium: 9.3 mg/dL (ref 8.9–10.3)
Chloride: 100 mmol/L (ref 98–111)
Creatinine: 1.76 mg/dL — ABNORMAL HIGH (ref 0.44–1.00)
GFR, Estimated: 27 mL/min — ABNORMAL LOW (ref >60)
Glucose, Bld: 182 mg/dL — ABNORMAL HIGH (ref 70–99)
Potassium: 4.7 mmol/L (ref 3.5–5.1)
Sodium: 134 mmol/L — ABNORMAL LOW (ref 135–145)
Total Bilirubin: 0.3 mg/dL (ref 0.3–1.2)
Total Protein: 8.1 g/dL (ref 6.5–8.1)

## 2020-08-30 LAB — CBC WITH DIFFERENTIAL (CANCER CENTER ONLY)
Abs Immature Granulocytes: 0.08 10*3/uL — ABNORMAL HIGH (ref 0.00–0.07)
Basophils Absolute: 0 10*3/uL (ref 0.0–0.1)
Basophils Relative: 1 %
Eosinophils Absolute: 0 10*3/uL (ref 0.0–0.5)
Eosinophils Relative: 0 %
HCT: 29.4 % — ABNORMAL LOW (ref 36.0–46.0)
Hemoglobin: 9.5 g/dL — ABNORMAL LOW (ref 12.0–15.0)
Immature Granulocytes: 1 %
Lymphocytes Relative: 7 %
Lymphs Abs: 0.4 10*3/uL — ABNORMAL LOW (ref 0.7–4.0)
MCH: 32.4 pg (ref 26.0–34.0)
MCHC: 32.3 g/dL (ref 30.0–36.0)
MCV: 100.3 fL — ABNORMAL HIGH (ref 80.0–100.0)
Monocytes Absolute: 0.4 10*3/uL (ref 0.1–1.0)
Monocytes Relative: 6 %
Neutro Abs: 5.3 10*3/uL (ref 1.7–7.7)
Neutrophils Relative %: 85 %
Platelet Count: 168 10*3/uL (ref 150–400)
RBC: 2.93 MIL/uL — ABNORMAL LOW (ref 3.87–5.11)
RDW: 12.3 % (ref 11.5–15.5)
WBC Count: 6.3 10*3/uL (ref 4.0–10.5)
nRBC: 0 % (ref 0.0–0.2)

## 2020-08-30 LAB — PREALBUMIN: Prealbumin: 13.5 mg/dL — ABNORMAL LOW (ref 18–38)

## 2020-08-30 MED ORDER — SODIUM CHLORIDE 0.9 % IV SOLN
INTRAVENOUS | Status: DC
  Filled 2020-08-30: qty 250

## 2020-08-30 NOTE — Progress Notes (Signed)
NS saline IV 511ml over one hopur per Dr. Antonieta Pert verbal order.

## 2020-08-30 NOTE — Progress Notes (Signed)
Hematology and Oncology Follow Up Visit  Glenda King [id number] 01-13-1929 87 y.o. 08/30/2020   Principle Diagnosis:  Pancreatic cancer-localized  Current Therapy:        Status post radiosurgery-completed on 01/29/2020.   Xeloda 1000 mg po BID (14 on/7 off) -- cycle #1   Interim History:  Glenda King is here today for follow-up.  We now have her on Xeloda.  This is a single agent treatment for her pancreatic cancer.  I really suspect that she has metastatic disease.  Her CA 19-9 was going up steadily.  First, she was only taking 500 mg p.o. twice daily.  Then she got the right dose in.  She has had no problems with the Xeloda.  There is been no diarrhea.  She has had no redness in the hands or feet.  She has had no mouth sores.  She still is not all that hungry.  She is not having any nausea or vomiting.  She is dehydrated.  We will give her some IV fluids today.  Her last CA 19-9 in early March was 513.  Overall, I would say performance status is probably ECOG 2.  Medications:  Allergies as of 08/30/2020      Reactions   Gadolinium Derivatives Other (See Comments)   Secondary to her stage IV kidney disease   Amoxicillin Other (See Comments)   UNKNOWN REACTION   Pollen Extract Other (See Comments)   Sneezing      Medication List       Accurate as of August 30, 2020  1:07 PM. If you have any questions, ask your nurse or doctor.        capecitabine 500 MG tablet Commonly known as: XELODA Take 2 tablets (1,000 mg total) by mouth 2 (two) times daily after a meal. Take for 14 days, then hold for 7 days. Repeat every 21 days,   cholecalciferol 25 MCG (1000 UNIT) tablet Commonly known as: VITAMIN D3 Take 1,000 Units by mouth 2 (two) times daily.   cyanocobalamin 1000 MCG tablet Take 1 tablet (1,000 mcg total) by mouth daily.   levETIRAcetam 500 MG tablet Commonly known as: KEPPRA Take 500 mg by mouth 2 (two) times daily.   levothyroxine 100 MCG  tablet Commonly known as: SYNTHROID Take 100 mcg by mouth daily.   Melatonin 10 MG Caps Take 10 mg by mouth at bedtime.   metoprolol tartrate 25 MG tablet Commonly known as: LOPRESSOR Take 25 mg by mouth 2 (two) times daily.   metoprolol tartrate 25 MG tablet Commonly known as: LOPRESSOR Take 1 tablet (25 mg total) by mouth 2 (two) times daily.   sodium bicarbonate 650 MG tablet Take 650 mg by mouth daily.       Allergies:  Allergies  Allergen Reactions  . Gadolinium Derivatives Other (See Comments)    Secondary to her stage IV kidney disease   . Amoxicillin Other (See Comments)    UNKNOWN REACTION     . Pollen Extract Other (See Comments)    Sneezing    Past Medical History, Surgical history, Social history, and Family History were reviewed and updated.  Review of Systems: Review of Systems  Constitutional: Positive for malaise/fatigue and weight loss.  HENT: Negative.   Eyes: Negative.   Respiratory: Negative.   Cardiovascular: Negative.   Gastrointestinal: Negative.   Genitourinary: Negative.   Musculoskeletal: Negative.   Skin: Negative.   Neurological: Negative.   Endo/Heme/Allergies: Negative.   Psychiatric/Behavioral: Negative.  Physical Exam:  weight is 94 lb 1.9 oz (42.7 kg). Her oral temperature is 97.5 F (36.4 C) (abnormal). Her blood pressure is 112/57 (abnormal) and her pulse is 57 (abnormal). Her respiration is 20 and oxygen saturation is 99%.   Wt Readings from Last 3 Encounters:  08/30/20 94 lb 1.9 oz (42.7 kg)  08/12/20 98 lb 12.8 oz (44.8 kg)  07/14/20 97 lb (44 kg)    Physical Exam Vitals reviewed.  Constitutional:      Comments: Glenda King is a thin white female.  She is not jaundiced.  She has symmetric muscle atrophy in upper and lower extremities.  She has the cholecystostomy tube in the right upper quadrant.  There is no drainage in the bag.  There is no erythema or warmth at the entry site.  HENT:     Head:  Normocephalic and atraumatic.  Eyes:     Pupils: Pupils are equal, round, and reactive to light.  Cardiovascular:     Rate and Rhythm: Normal rate and regular rhythm.     Heart sounds: Normal heart sounds.  Pulmonary:     Effort: Pulmonary effort is normal.     Breath sounds: Normal breath sounds.  Abdominal:     General: Bowel sounds are normal.     Palpations: Abdomen is soft.  Musculoskeletal:        General: No tenderness or deformity. Normal range of motion.     Cervical back: Normal range of motion.  Lymphadenopathy:     Cervical: No cervical adenopathy.  Skin:    General: Skin is warm and dry.     Findings: No erythema or rash.  Neurological:     Mental Status: She is alert and oriented to person, place, and time.  Psychiatric:        Behavior: Behavior normal.        Thought Content: Thought content normal.        Judgment: Judgment normal.      Lab Results  Component Value Date   WBC 6.3 08/30/2020   HGB 9.5 (L) 08/30/2020   HCT 29.4 (L) 08/30/2020   MCV 100.3 (H) 08/30/2020   PLT 168 08/30/2020   Lab Results  Component Value Date   FERRITIN 822 (H) 04/26/2020   IRON 53 04/26/2020   TIBC 203 (L) 04/26/2020   UIBC 150 04/26/2020   IRONPCTSAT 26 04/26/2020   Lab Results  Component Value Date   RETICCTPCT 0.8 08/30/2020   RBC 2.91 (L) 08/30/2020   RBC 2.93 (L) 08/30/2020   No results found for: KPAFRELGTCHN, LAMBDASER, KAPLAMBRATIO No results found for: IGGSERUM, IGA, IGMSERUM No results found for: TOTALPROTELP, ALBUMINELP, A1GS, A2GS, BETS, BETA2SER, GAMS, MSPIKE, SPEI   Chemistry      Component Value Date/Time   NA 134 (L) 08/30/2020 1132   K 4.7 08/30/2020 1132   CL 100 08/30/2020 1132   CO2 24 08/30/2020 1132   BUN 60 (H) 08/30/2020 1132   CREATININE 1.76 (H) 08/30/2020 1132      Component Value Date/Time   CALCIUM 9.3 08/30/2020 1132   ALKPHOS 285 (H) 08/30/2020 1132   AST 22 08/30/2020 1132   ALT 34 08/30/2020 1132   BILITOT 0.3  08/30/2020 1132       Impression and Plan: Glenda King is a very charming 87 yo white female with history of pancreatic cancer.  She was treated with stereotactic radiosurgery.  I have her on single agent Xeloda.  She is on a reduced  dose.  We will see how she might do with this.  I we will give her some IV fluids today.  I do think she is hypobulimic.  She really would like to see interventional radiology again.  This has happened to do with the indwelling cholecystotomy tube that she has.  We will plan to get her back here in another 3 weeks.       [name], MD 3/21/20221:07 PM

## 2020-08-31 ENCOUNTER — Encounter: Payer: Self-pay | Admitting: *Deleted

## 2020-08-31 LAB — FERRITIN: Ferritin: 1023 ng/mL — ABNORMAL HIGH (ref 11–307)

## 2020-08-31 LAB — CANCER ANTIGEN 19-9: CA 19-9: 484 U/mL — ABNORMAL HIGH (ref 0–35)

## 2020-08-31 LAB — IRON AND TIBC
Iron: 56 ug/dL (ref 41–142)
Saturation Ratios: 24 % (ref 21–57)
TIBC: 231 ug/dL — ABNORMAL LOW (ref 236–444)
UIBC: 175 ug/dL (ref 120–384)

## 2020-08-31 LAB — ERYTHROPOIETIN: Erythropoietin: 10.8 m[IU]/mL (ref 2.6–18.5)

## 2020-08-31 NOTE — Progress Notes (Signed)
Oncology Nurse Navigator Documentation  Oncology Nurse Navigator Flowsheets 08/31/2020  Abnormal Finding Date -  Diagnosis Status -  Planned Course of Treatment -  Phase of Treatment -  Chemotherapy Pending- Reason: -  Chemotherapy Actual Start Date: -  Radiation Actual Start Date: -  Radiation Expected End Date: -  Radiation Actual End Date: -  Navigator Follow Up Date: 09/22/2020  Navigator Follow Up Reason: Follow-up Appointment  Navigation Complete Date: -  Navigator Location [facility]  Referral Date to RadOnc/MedOnc -  Navigator Encounter Type Appt/Treatment Plan Review  Telephone -  Treatment Initiated Date -  Patient Visit Type MedOnc  Treatment Phase Active Tx  Barriers/Navigation Needs Coordination of Care;Education  Education -  Interventions None Required  Acuity Level 2-Minimal Needs (1-2 Barriers Identified)  Referrals -  Coordination of Care -  Education Method -  Support Groups/Services Friends and Family  Time Spent with Patient 15

## 2020-09-06 ENCOUNTER — Encounter: Payer: Self-pay | Admitting: *Deleted

## 2020-09-06 DIAGNOSIS — C259 Malignant neoplasm of pancreas, unspecified: Secondary | ICD-10-CM

## 2020-09-06 MED ORDER — ONDANSETRON HCL 4 MG PO TABS: 4.0000 mg | ORAL_TABLET | Freq: Three times a day (TID) | ORAL | 2 refills | Status: AC | PRN

## 2020-09-06 NOTE — Progress Notes (Signed)
Spoke to patient's family member, Glenda King, who is on patient's DPR. She had questions about patients condition, prognosis and potential for further decline and or death. She worries as there are several family members who question whether or not they should make a trip to see here at this time.   Answered Ann's questions about effects from the chemo and disease. Expressed that it is not possible to give her a timeframe as things can be very unpredictable. We did discuss that if patient continues to lose weight, this is a unfavorable sign. Suggested that if it's important to certain family members to be able to see and speak to patient prior to any possible decline/death, that they should probably go ahead and make those plans, as there are no guarantees. She was appreciative of the information.   Oncology Nurse Navigator Documentation  Oncology Nurse Navigator Flowsheets 09/06/2020  Abnormal Finding Date -  Diagnosis Status -  Planned Course of Treatment -  Phase of Treatment -  Chemotherapy Pending- Reason: -  Chemotherapy Actual Start Date: -  Radiation Actual Start Date: -  Radiation Expected End Date: -  Radiation Actual End Date: -  Navigator Follow Up Date: 09/22/2020  Navigator Follow Up Reason: Follow-up Appointment  Navigation Complete Date: -  Navigator Location [facility]  Referral Date to RadOnc/MedOnc -  Navigator Encounter Type Telephone  Telephone Patient Update;Incoming Call  Treatment Initiated Date -  Patient Visit Type MedOnc  Treatment Phase Active Tx  Barriers/Navigation Needs Coordination of Care;Education  Education Other  Interventions Education;Psycho-Social Support  Acuity Level 2-Minimal Needs (1-2 Barriers Identified)  Referrals -  Coordination of Care -  Education Method Verbal  Support Groups/Services Friends and Family  Time Spent with Patient 65

## 2020-09-06 NOTE — Progress Notes (Signed)
Patient is c/o nausea with this most recent cycle of chemo. She currently doesn't have any antiemetics to take.  Prescription for zofran called into [facility]). Reviewed administration instructions. Informed patient on side effects including constipation.   Oncology Nurse Navigator Documentation  Oncology Nurse Navigator Flowsheets 09/06/2020  Abnormal Finding Date -  Diagnosis Status -  Planned Course of Treatment -  Phase of Treatment -  Chemotherapy Pending- Reason: -  Chemotherapy Actual Start Date: -  Radiation Actual Start Date: -  Radiation Expected End Date: -  Radiation Actual End Date: -  Navigator Follow Up Date: 09/22/2020  Navigator Follow Up Reason: Follow-up Appointment  Navigation Complete Date: -  Navigator Location [facility]  Referral Date to RadOnc/MedOnc -  Navigator Encounter Type Telephone  Telephone Symptom Mgt;Incoming Call  Treatment Initiated Date -  Patient Visit Type MedOnc  Treatment Phase Active Tx  Barriers/Navigation Needs Coordination of Care;Education  Education Other  Interventions Medication Assistance;Psycho-Social Support  Acuity Level 2-Minimal Needs (1-2 Barriers Identified)  Referrals -  Coordination of Care -  Education Method Verbal  Support Groups/Services Friends and Family  Time Spent with Patient 30

## 2020-09-08 ENCOUNTER — Encounter: Payer: Self-pay | Admitting: *Deleted

## 2020-09-08 NOTE — Progress Notes (Signed)
Patient continues to have multiple side effects from the Xeloda. Despite zofran TID she still feels nauseated when trying to eat. She has no appetite and she states she's extremely weak. She is supposed to start her next cycle tomorrow.   Instructed patient to hold Xeloda until she is seen on 4/13. She is very happy to hold as she doesn't want to restart tomorrow or feel any worse. Notified Dr Marin Olp.   Oncology Nurse Navigator Documentation  Oncology Nurse Navigator Flowsheets 09/08/2020  Abnormal Finding Date -  Diagnosis Status -  Planned Course of Treatment -  Phase of Treatment -  Chemotherapy Pending- Reason: -  Chemotherapy Actual Start Date: -  Radiation Actual Start Date: -  Radiation Expected End Date: -  Radiation Actual End Date: -  Navigator Follow Up Date: 09/22/2020  Navigator Follow Up Reason: Follow-up Appointment  Navigation Complete Date: -  Navigator Location [facility]  Referral Date to RadOnc/MedOnc -  Navigator Encounter Type Telephone  Telephone Symptom Mgt;Incoming Call  Treatment Initiated Date -  Patient Visit Type MedOnc  Treatment Phase Active Tx  Barriers/Navigation Needs Coordination of Care;Education  Education Pain/ Symptom Management;Other  Interventions Medication Assistance;Psycho-Social Support;Education  Acuity Level 2-Minimal Needs (1-2 Barriers Identified)  Referrals -  Coordination of Care -  Education Method Verbal;Teach-back  Support Groups/Services Friends and Family  Time Spent with Patient 30

## 2020-09-09 ENCOUNTER — Telehealth: Payer: Self-pay | Admitting: Pulmonary Disease

## 2020-09-09 NOTE — Telephone Encounter (Signed)
Received call via [facility] because the patient was brought to the [facility] ED today with multi-organ failure, septic shock because of duodenal obstruction.  She has pancreatic cancer.  She will die from this illness.  I explained that we had nothing to offer here that could not be performed at that facility and that the risk of transporting the patient outweighs any benefit.  Declined transfer.

## 2020-09-16 ENCOUNTER — Other Ambulatory Visit (HOSPITAL_COMMUNITY): Payer: Self-pay

## 2020-09-20 ENCOUNTER — Encounter: Payer: Self-pay | Admitting: *Deleted

## 2020-09-20 NOTE — Progress Notes (Signed)
Patient was discharged from hospital last week with hospice. She is at [facility]. Called and spoke to Shirlee Latch to check on patient status. She states patient is actively dying and hasn't eaten in days. Ladrea is still alert and can speak at times, but she overall spends the day asleep. He appears comfortable.  Appointments for tomorrow cancelled. Encouraged [facility] to call us if they have any needs. She agrees.   Oncology Nurse Navigator Documentation  Oncology Nurse Navigator Flowsheets 09/20/2020  Abnormal Finding Date -  Diagnosis Status -  Planned Course of Treatment -  Phase of Treatment -  Chemotherapy Pending- Reason: -  Chemotherapy Actual Start Date: -  Radiation Actual Start Date: -  Radiation Expected End Date: -  Radiation Actual End Date: -  Navigator Follow Up Date: -  Navigator Follow Up Reason: -  Navigation Complete Date: 09/20/2020  Post Navigation: Continue to Follow Patient? No  Reason Not Navigating Patient: Hospice/Death  Navigator Location [facility]  Referral Date to RadOnc/MedOnc -  Navigator Encounter Type Telephone  Telephone Outgoing Call;Patient Update  Treatment Initiated Date -  Patient Visit Type MedOnc  Treatment Phase Active Tx  Barriers/Navigation Needs -  Education -  Interventions Psycho-Social Support  Acuity Level 2-Minimal Needs (1-2 Barriers Identified)  Referrals -  Coordination of Care -  Education Method -  Support Groups/Services Friends and Family  Time Spent with Patient 30

## 2020-09-21 ENCOUNTER — Other Ambulatory Visit (HOSPITAL_COMMUNITY): Payer: Medicare Other

## 2020-09-22 ENCOUNTER — Ambulatory Visit: Payer: Medicare Other | Admitting: Hematology & Oncology

## 2020-09-22 ENCOUNTER — Inpatient Hospital Stay (HOSPITAL_BASED_OUTPATIENT_CLINIC_OR_DEPARTMENT_OTHER)
Admission: EM | Admit: 2020-09-22 | Discharge: 2020-09-25 | DRG: 698 | Disposition: A | Discharge status: Skilled Nursing Facility | Payer: Medicare Other | Attending: Internal Medicine | Admitting: Internal Medicine

## 2020-09-22 ENCOUNTER — Emergency Department (HOSPITAL_BASED_OUTPATIENT_CLINIC_OR_DEPARTMENT_OTHER): Payer: Medicare Other

## 2020-09-22 ENCOUNTER — Encounter (HOSPITAL_BASED_OUTPATIENT_CLINIC_OR_DEPARTMENT_OTHER): Payer: Self-pay

## 2020-09-22 ENCOUNTER — Other Ambulatory Visit: Payer: Self-pay | Admitting: Internal Medicine

## 2020-09-22 ENCOUNTER — Ambulatory Visit: Payer: Medicare Other

## 2020-09-22 ENCOUNTER — Other Ambulatory Visit: Payer: Medicare Other

## 2020-09-22 ENCOUNTER — Other Ambulatory Visit: Payer: Self-pay

## 2020-09-22 DIAGNOSIS — N184 Chronic kidney disease, stage 4 (severe): Secondary | ICD-10-CM | POA: Diagnosis present

## 2020-09-22 DIAGNOSIS — B9689 Other specified bacterial agents as the cause of diseases classified elsewhere: Secondary | ICD-10-CM | POA: Diagnosis present

## 2020-09-22 DIAGNOSIS — E43 Unspecified severe protein-calorie malnutrition: Secondary | ICD-10-CM | POA: Diagnosis present

## 2020-09-22 DIAGNOSIS — Z8551 Personal history of malignant neoplasm of bladder: Secondary | ICD-10-CM

## 2020-09-22 DIAGNOSIS — D631 Anemia in chronic kidney disease: Secondary | ICD-10-CM | POA: Diagnosis present

## 2020-09-22 DIAGNOSIS — Z91041 Radiographic dye allergy status: Secondary | ICD-10-CM

## 2020-09-22 DIAGNOSIS — T83510A Infection and inflammatory reaction due to cystostomy catheter, initial encounter: Principal | ICD-10-CM | POA: Diagnosis present

## 2020-09-22 DIAGNOSIS — Z681 Body mass index (BMI) 19 or less, adult: Secondary | ICD-10-CM

## 2020-09-22 DIAGNOSIS — T83090A Other mechanical complication of cystostomy catheter, initial encounter: Secondary | ICD-10-CM | POA: Diagnosis present

## 2020-09-22 DIAGNOSIS — Z66 Do not resuscitate: Secondary | ICD-10-CM | POA: Diagnosis present

## 2020-09-22 DIAGNOSIS — I480 Paroxysmal atrial fibrillation: Secondary | ICD-10-CM | POA: Diagnosis present

## 2020-09-22 DIAGNOSIS — Z880 Allergy status to penicillin: Secondary | ICD-10-CM

## 2020-09-22 DIAGNOSIS — Z79899 Other long term (current) drug therapy: Secondary | ICD-10-CM

## 2020-09-22 DIAGNOSIS — Y738 Miscellaneous gastroenterology and urology devices associated with adverse incidents, not elsewhere classified: Secondary | ICD-10-CM | POA: Diagnosis present

## 2020-09-22 DIAGNOSIS — N179 Acute kidney failure, unspecified: Secondary | ICD-10-CM | POA: Diagnosis present

## 2020-09-22 DIAGNOSIS — I959 Hypotension, unspecified: Secondary | ICD-10-CM | POA: Diagnosis present

## 2020-09-22 DIAGNOSIS — R339 Retention of urine, unspecified: Secondary | ICD-10-CM

## 2020-09-22 DIAGNOSIS — C259 Malignant neoplasm of pancreas, unspecified: Secondary | ICD-10-CM | POA: Diagnosis present

## 2020-09-22 DIAGNOSIS — E876 Hypokalemia: Secondary | ICD-10-CM | POA: Diagnosis present

## 2020-09-22 DIAGNOSIS — E875 Hyperkalemia: Secondary | ICD-10-CM | POA: Diagnosis present

## 2020-09-22 DIAGNOSIS — N138 Other obstructive and reflux uropathy: Secondary | ICD-10-CM | POA: Diagnosis present

## 2020-09-22 DIAGNOSIS — N136 Pyonephrosis: Secondary | ICD-10-CM | POA: Diagnosis present

## 2020-09-22 DIAGNOSIS — Z515 Encounter for palliative care: Secondary | ICD-10-CM

## 2020-09-22 DIAGNOSIS — Z79890 Hormone replacement therapy (postmenopausal): Secondary | ICD-10-CM

## 2020-09-22 DIAGNOSIS — I429 Cardiomyopathy, unspecified: Secondary | ICD-10-CM | POA: Diagnosis present

## 2020-09-22 DIAGNOSIS — E039 Hypothyroidism, unspecified: Secondary | ICD-10-CM | POA: Diagnosis present

## 2020-09-22 DIAGNOSIS — Z20822 Contact with and (suspected) exposure to covid-19: Secondary | ICD-10-CM | POA: Diagnosis present

## 2020-09-22 DIAGNOSIS — R569 Unspecified convulsions: Secondary | ICD-10-CM | POA: Diagnosis present

## 2020-09-22 DIAGNOSIS — Z888 Allergy status to other drugs, medicaments and biological substances status: Secondary | ICD-10-CM

## 2020-09-22 DIAGNOSIS — E871 Hypo-osmolality and hyponatremia: Secondary | ICD-10-CM | POA: Diagnosis not present

## 2020-09-22 DIAGNOSIS — Z7189 Other specified counseling: Secondary | ICD-10-CM | POA: Diagnosis not present

## 2020-09-22 LAB — COMPREHENSIVE METABOLIC PANEL
ALT: 22 U/L (ref 0–44)
AST: 27 U/L (ref 15–41)
Albumin: 2.4 g/dL — ABNORMAL LOW (ref 3.5–5.0)
Alkaline Phosphatase: 121 U/L (ref 38–126)
Anion gap: 17 — ABNORMAL HIGH (ref 5–15)
BUN: 90 mg/dL — ABNORMAL HIGH (ref 8–23)
CO2: 23 mmol/L (ref 22–32)
Calcium: 7.4 mg/dL — ABNORMAL LOW (ref 8.9–10.3)
Chloride: 92 mmol/L — ABNORMAL LOW (ref 98–111)
Creatinine, Ser: 5.3 mg/dL — ABNORMAL HIGH (ref 0.44–1.00)
GFR, Estimated: 7 mL/min — ABNORMAL LOW (ref >60)
Glucose, Bld: 100 mg/dL — ABNORMAL HIGH (ref 70–99)
Potassium: 2.8 mmol/L — ABNORMAL LOW (ref 3.5–5.1)
Sodium: 132 mmol/L — ABNORMAL LOW (ref 135–145)
Total Bilirubin: 0.7 mg/dL (ref 0.3–1.2)
Total Protein: 6.6 g/dL (ref 6.5–8.1)

## 2020-09-22 LAB — CBC WITH DIFFERENTIAL/PLATELET
Abs Immature Granulocytes: 0.07 10*3/uL (ref 0.00–0.07)
Basophils Absolute: 0 10*3/uL (ref 0.0–0.1)
Basophils Relative: 0 %
Eosinophils Absolute: 0 10*3/uL (ref 0.0–0.5)
Eosinophils Relative: 0 %
HCT: 26.3 % — ABNORMAL LOW (ref 36.0–46.0)
Hemoglobin: 8.5 g/dL — ABNORMAL LOW (ref 12.0–15.0)
Immature Granulocytes: 1 %
Lymphocytes Relative: 3 %
Lymphs Abs: 0.4 10*3/uL — ABNORMAL LOW (ref 0.7–4.0)
MCH: 33.3 pg (ref 26.0–34.0)
MCHC: 32.3 g/dL (ref 30.0–36.0)
MCV: 103.1 fL — ABNORMAL HIGH (ref 80.0–100.0)
Monocytes Absolute: 0.4 10*3/uL (ref 0.1–1.0)
Monocytes Relative: 3 %
Neutro Abs: 12.5 10*3/uL — ABNORMAL HIGH (ref 1.7–7.7)
Neutrophils Relative %: 93 %
Platelets: 235 10*3/uL (ref 150–400)
RBC: 2.55 MIL/uL — ABNORMAL LOW (ref 3.87–5.11)
RDW: 14.9 % (ref 11.5–15.5)
WBC: 13.5 10*3/uL — ABNORMAL HIGH (ref 4.0–10.5)
nRBC: 0 % (ref 0.0–0.2)

## 2020-09-22 LAB — URINALYSIS, MICROSCOPIC (REFLEX)

## 2020-09-22 LAB — URINALYSIS, ROUTINE W REFLEX MICROSCOPIC
Bilirubin Urine: NEGATIVE
Glucose, UA: NEGATIVE mg/dL
Ketones, ur: NEGATIVE mg/dL
Nitrite: POSITIVE — AB
Protein, ur: 30 mg/dL — AB
Specific Gravity, Urine: 1.01 (ref 1.005–1.030)
pH: 7 (ref 5.0–8.0)

## 2020-09-22 LAB — RESP PANEL BY RT-PCR (FLU A&B, COVID) ARPGX2
Influenza A by PCR: NEGATIVE
Influenza B by PCR: NEGATIVE
SARS Coronavirus 2 by RT PCR: NEGATIVE

## 2020-09-22 IMAGING — CT CT ABD-PELV W/O CM
2 of 4 series · 16 of 46 positions shown, 18 images · non-contrast
Comparison: [DATE], [DATE]

CLINICAL DATA: Nausea and vomiting, flank pain, suprapubic catheter
not draining, history of pancreatic cancer

EXAM:
CT ABDOMEN AND PELVIS WITHOUT CONTRAST
TECHNIQUE: Multidetector CT imaging of the abdomen and pelvis was performed
following the standard protocol without IV contrast.

[Series 2: axial st · axial · 0.79mm/px · z∈[-386,-32]mm · 13 of 79 slices shown, 15 images]
[im 4/79  soft-tissue]
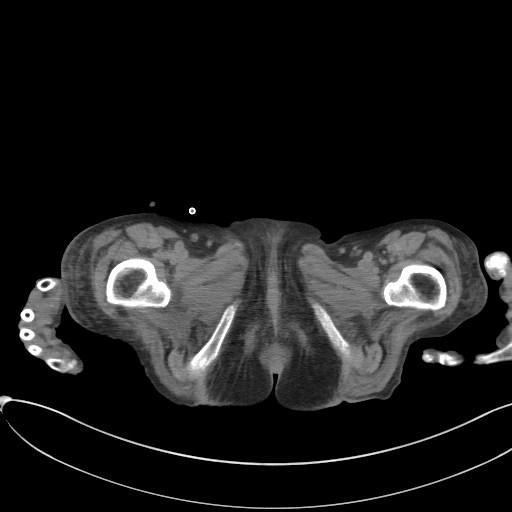
[im 4/79  bone]
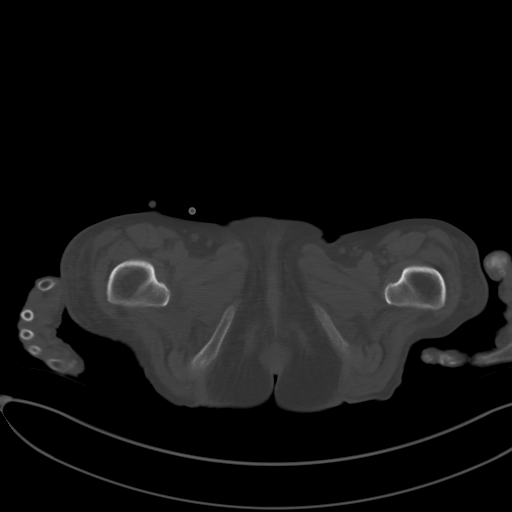
[im 10/79  soft-tissue]
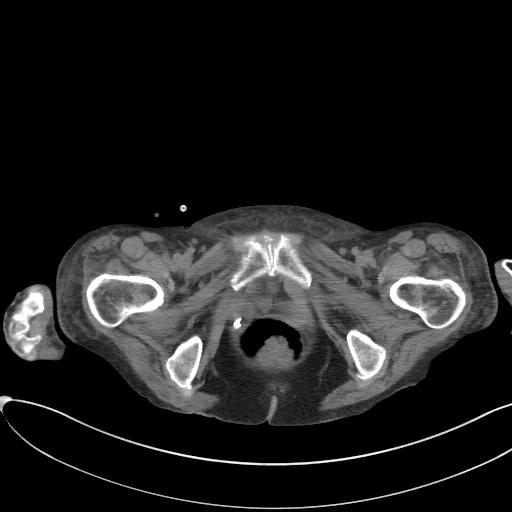
[im 17/79  soft-tissue]
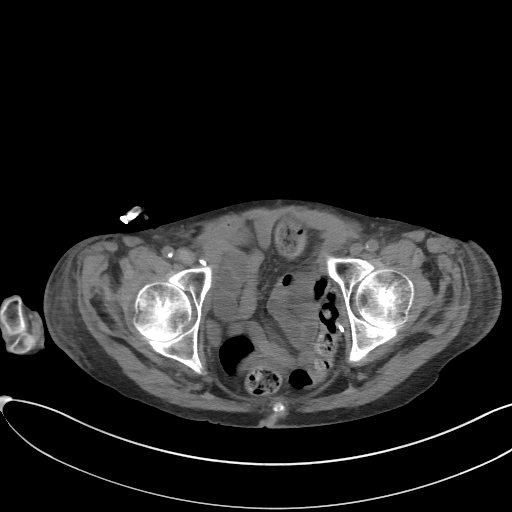
[im 23/79  soft-tissue]
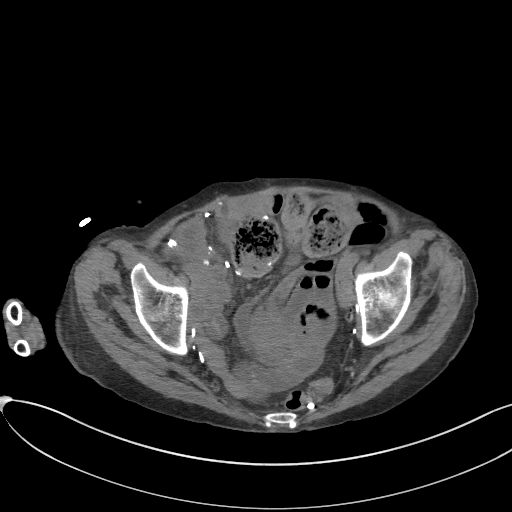
[im 27/79  soft-tissue]
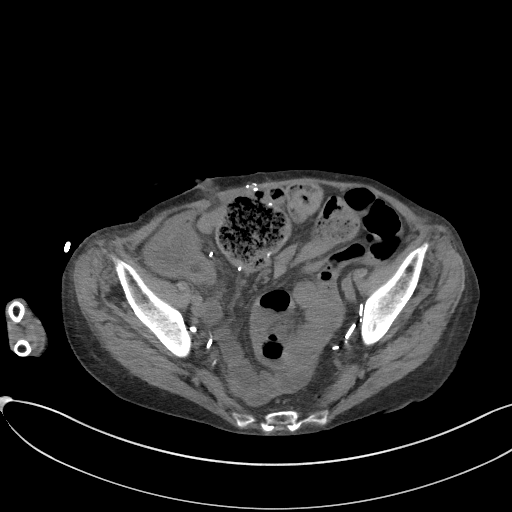
[im 33/79  soft-tissue]
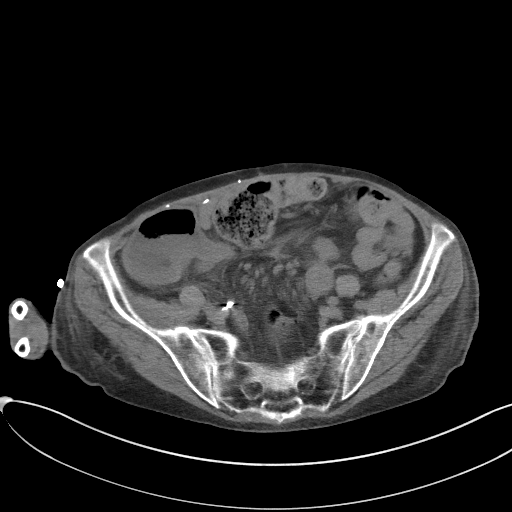
[im 40/79  soft-tissue]
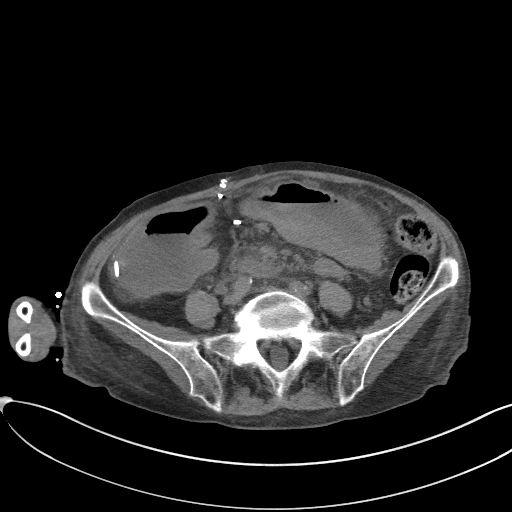
[im 46/79  soft-tissue]
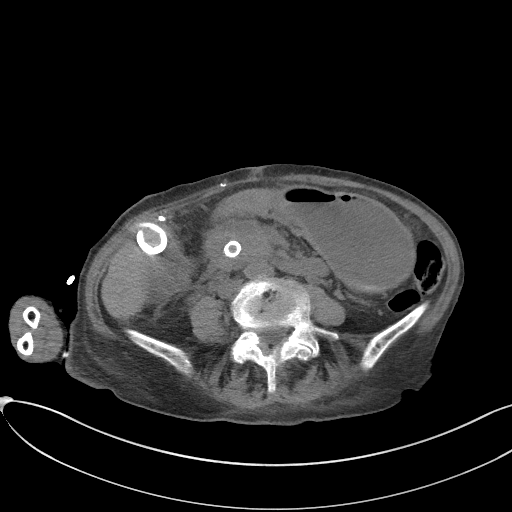
[im 53/79  soft-tissue]
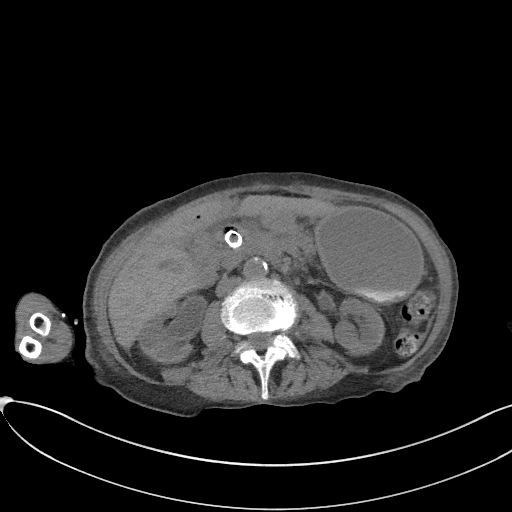
[im 53/79  bone]
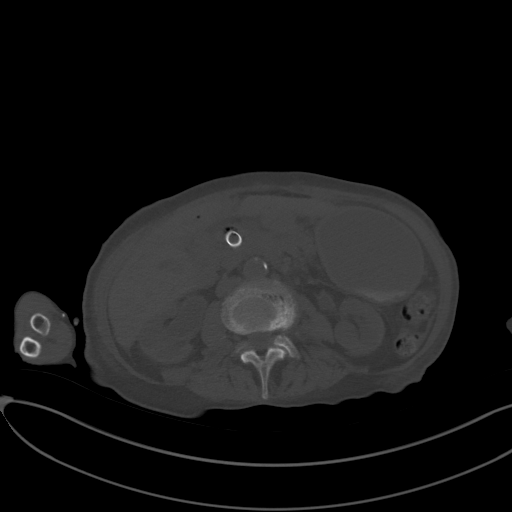
[im 56/79  soft-tissue]
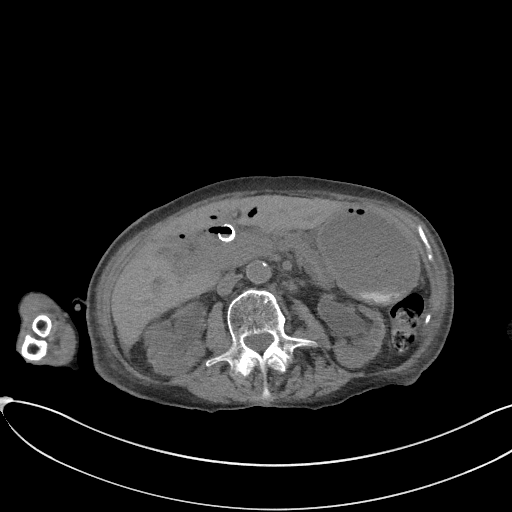
[im 62/79  soft-tissue]
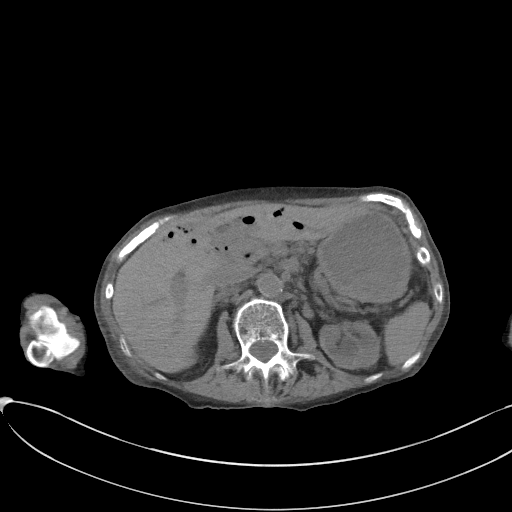
[im 69/79  soft-tissue]
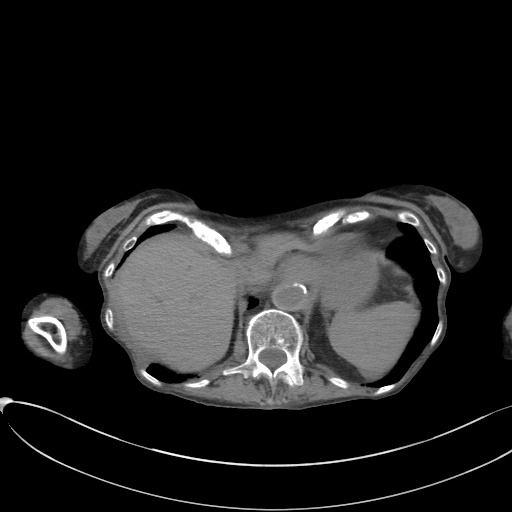
[im 75/79  soft-tissue]
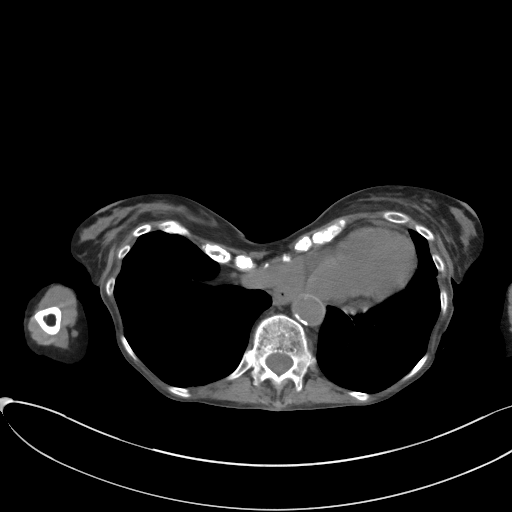

[Series 5: coronal st · coronal · 0.70mm/px · 3 of 99 slices shown]
[im 33/99  soft-tissue]
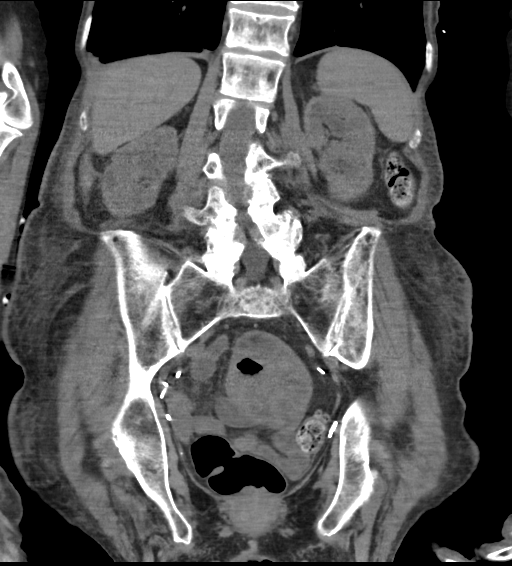
[im 44/99  soft-tissue]
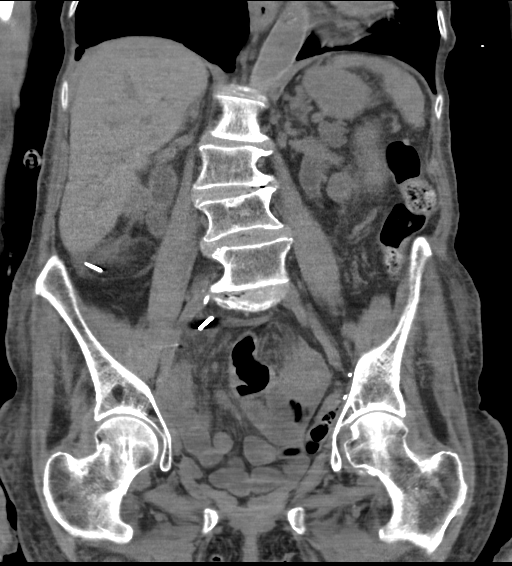
[im 55/99  soft-tissue]
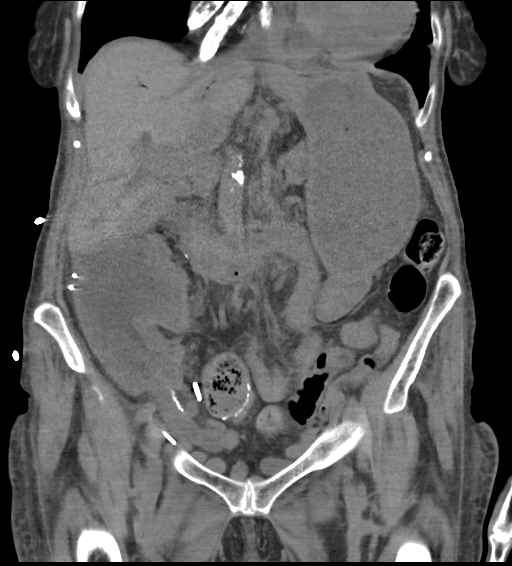

[16 of 46 positions shown; findings below may reference images not displayed]

FINDINGS: Lower chest: Pectus excavatum deformity again noted. No acute
pleural or parenchymal lung disease.

Unenhanced CT was performed per clinician order. Lack of IV contrast
limits sensitivity and specificity, especially for evaluation of
abdominal/pelvic solid viscera.

Hepatobiliary: Unenhanced imaging of the liver demonstrates diffuse
pneumobilia consistent with patency of the indwelling metallic
common bile duct stent. Pigtail drainage catheter is seen in the
region of the gallbladder fundus. Mild pericholecystic fluid and
gallbladder wall thickening.

Pancreas: Evaluation of the pancreas is limited without contrast.
Cystic areas in the pancreatic tail are unchanged. Ill-defined soft
tissue prominence in the pancreatic head, traversed by the metallic
stent, consistent with known pancreatic cancer. Stable pancreatic
duct dilation.

Spleen: Normal in size without focal abnormality.

Adrenals/Urinary Tract: Marked bilateral renal cortical atrophy.
Diverting urostomy right lower quadrant with indwelling Foley
catheter. Mild distension of the bilateral renal collecting systems
and ureters, with diffuse uroepithelial thickening, unchanged since
prior exam. Gas and fluid are seen within the neobladder.

Stomach/Bowel: No bowel obstruction or ileus. No bowel wall
thickening or inflammatory change. Diverticulosis of the sigmoid
colon without diverticulitis.

Vascular/Lymphatic: Stable atherosclerosis of the aorta. No gross
lymphadenopathy on this unenhanced exam.

Reproductive: Status post hysterectomy. No adnexal masses.

Other: No free fluid or free intraperitoneal gas. No abdominal wall
hernia.

Musculoskeletal: No acute or destructive bony lesions. The patient
is cachectic. Reconstructed images demonstrate no additional
findings.
IMPRESSION: 1. Stable ill-defined mass in the pancreatic head, traversed by a
metallic biliary stent, consistent with known pancreatic cancer.
2. Pneumobilia consistent with stent patency.
3. Stable percutaneous cholecystostomy, with persistent wall
thickening of the gallbladder and trace pericholecystic fluid.
4. Postsurgical changes from cystectomy and urinary diversion.
Chronic mild bilateral hydronephrosis and hydroureter with
uroepithelial thickening unchanged. There is moderate gas and fluid
within the neobladder, with indwelling Foley catheter.
5.  Aortic Atherosclerosis ([SC]-[SC]).

## 2020-09-22 MED ORDER — POTASSIUM CHLORIDE CRYS ER 20 MEQ PO TBCR: 40.0000 meq | EXTENDED_RELEASE_TABLET | Freq: Once | ORAL | Status: DC

## 2020-09-22 MED ORDER — POTASSIUM CHLORIDE 20 MEQ PO PACK
40.0000 meq | PACK | Freq: Once | ORAL | Status: AC
  Administered 2020-09-22: 40 meq via ORAL
  Filled 2020-09-22: qty 2

## 2020-09-22 MED ORDER — SODIUM CHLORIDE 0.9 % IV BOLUS
1000.0000 mL | Freq: Once | INTRAVENOUS | Status: AC
  Administered 2020-09-22: 1000 mL via INTRAVENOUS

## 2020-09-22 MED ORDER — SODIUM CHLORIDE 0.9 % IV SOLN
1.0000 g | Freq: Once | INTRAVENOUS | Status: AC
  Administered 2020-09-22: 1 g via INTRAVENOUS
  Filled 2020-09-22: qty 10

## 2020-09-22 MED ORDER — POTASSIUM CHLORIDE 10 MEQ/100ML IV SOLN
10.0000 meq | INTRAVENOUS | Status: AC
  Administered 2020-09-22 (×3): 10 meq via INTRAVENOUS
  Filled 2020-09-22 (×3): qty 100

## 2020-09-22 NOTE — ED Notes (Signed)
Report to Shaun, RN

## 2020-09-22 NOTE — ED Provider Notes (Signed)
[facility] EMERGENCY DEPARTMENT Provider Note   CSN: [id number] Arrival date & time: 09/22/20  1637     History Chief Complaint  Patient presents with  . Urinary Retention    Glenda King is a 87 y.o. female history of CKD, pancreatic cancer on hospice, suprapubic catheter and nephrostomy tube who presented with suprapubic catheter not draining.  Patient was recently admitted for AKI and was hypotensive at that time.  She is back at the nursing home now.  She had her suprapubic catheter changed yesterday.  She states that today is stopped draining.  She has some suprapubic pressure.  She states that she is not currently on antibiotics for UTI.  The history is provided by the patient.       Past Medical History:  Diagnosis Date  . Anemia   . Cancer [facility])    bladder cancer  . Cardiomyopathy ([place])   . CKD (chronic kidney disease)   . Goals of care, counseling/discussion 12/10/2019  . Primary pancreatic cancer ([place]) 12/10/2019  . Thyroid disease    hypothyroid    Patient Active Problem List   Diagnosis Date Noted  . Acute kidney failure ([place]) 09/22/2020  . Protein-calorie malnutrition, severe 04/06/2020  . New onset a-fib ([place]) 04/02/2020  . Acute lower UTI 04/02/2020  . AKI (acute kidney injury) ([place]) 04/02/2020  . Elevated troponin 04/02/2020  . Genetic testing 12/16/2019  . Primary pancreatic cancer ([place]) 12/10/2019  . Goals of care, counseling/discussion 12/10/2019  . Bile duct stricture   . Jaundice 09/04/2019  . Hyperbilirubinemia 09/04/2019  . Acute cholecystitis due to biliary calculus 09/03/2019  . Choledocholithiasis 09/03/2019  . Chronic kidney disease (CKD) stage G4/A1, severely decreased glomerular filtration rate (GFR) between 15-29 mL/min/1.73 square meter and albuminuria creatinine ratio less than 30 mg/g (HCC) 09/03/2019  . History of seizure 09/03/2019  . Meningioma ([place]) 05/15/2016  . Urinary retention 01/20/2016  . Left frontal  lobe mass 03/24/2014  . History of bladder cancer 02/12/2014  . History of urinary diversion procedure 02/12/2014  . Hydronephrosis, bilateral 02/12/2014  . Anemia in chronic renal disease 11/17/2013  . Lack of adequate sleep 11/16/2012  . Malignant neoplasm of bladder, unspecified ([place]) 10/17/2007  . COLONIC POLYPS 10/17/2007  . Hypothyroidism 10/17/2007  . HYPERCHOLESTEROLEMIA, BORDERLINE 10/17/2007  . RENAL INSUFFICIENCY 10/17/2007    Past Surgical History:  Procedure Laterality Date  . BALLOON DILATION N/A 04/12/2020   Procedure: BALLOON DILATION;  Surgeon: Jackquline Denmark, MD;  Location: [facility];  Service: Endoscopy;  Laterality: N/A;  duodenal stricture   . BILIARY STENT PLACEMENT N/A 09/05/2019   Procedure: BILIARY STENT PLACEMENT;  Surgeon: Irene Shipper, MD;  Location: [facility];  Service: Endoscopy;  Laterality: N/A;  . BILIARY STENT PLACEMENT N/A 04/12/2020   Procedure: BILIARY STENT PLACEMENT;  Surgeon: Jackquline Denmark, MD;  Location: [facility];  Service: Endoscopy;  Laterality: N/A;  . BIOPSY  12/03/2019   Procedure: BIOPSY;  Surgeon: Rush Landmark Telford Nab., MD;  Location: [facility];  Service: Gastroenterology;;  . ERCP N/A 09/05/2019   Procedure: ENDOSCOPIC RETROGRADE CHOLANGIOPANCREATOGRAPHY (ERCP);  Surgeon: Irene Shipper, MD;  Location: [facility];  Service: Endoscopy;  Laterality: N/A;  . ERCP N/A 04/12/2020   Procedure: ENDOSCOPIC RETROGRADE CHOLANGIOPANCREATOGRAPHY (ERCP);  Surgeon: Jackquline Denmark, MD;  Location: [facility];  Service: Endoscopy;  Laterality: N/A;  with stent exchange  . ESOPHAGOGASTRODUODENOSCOPY (EGD) WITH PROPOFOL N/A 12/03/2019   Procedure: ESOPHAGOGASTRODUODENOSCOPY (EGD) WITH PROPOFOL;  Surgeon: Irving Copas., MD;  Location:  [facility];  Service: Gastroenterology;  Laterality: N/A;  . EUS N/A 12/03/2019   Procedure: UPPER ENDOSCOPIC ULTRASOUND (EUS) LINEAR;  Surgeon: Irving Copas., MD;  Location: [facility];   Service: Gastroenterology;  Laterality: N/A;  . FINE NEEDLE ASPIRATION N/A 12/03/2019   Procedure: FINE NEEDLE ASPIRATION (FNA) LINEAR;  Surgeon: Irving Copas., MD;  [facility];  Service: Gastroenterology;  Laterality: N/A;  . IR CATHETER TUBE CHANGE  05/28/2020  . IR EXCHANGE BILIARY DRAIN  06/07/2020  . IR EXCHANGE BILIARY DRAIN  07/23/2020  . IR EXCHANGE BILIARY DRAIN  08/05/2020  . IR PERC CHOLECYSTOSTOMY  04/08/2020  . IR RADIOLOGIST EVAL & MGMT  04/29/2020  . SPHINCTEROTOMY  09/05/2019   Procedure: SPHINCTEROTOMY;  Surgeon: Irene Shipper, MD;  Location: [facility];  Service: Endoscopy;;  . Lavell Islam REMOVAL  04/12/2020   Procedure: STENT REMOVAL;  Surgeon: Jackquline Denmark, MD;  Location: [facility];  Service: Endoscopy;;     OB History   No obstetric history on file.     Family History  Problem Relation Age of Onset  . Hypertension Other     Social History   Tobacco Use  . Smoking status: Never Smoker  . Smokeless tobacco: Never Used  Vaping Use  . Vaping Use: Never used  Substance Use Topics  . Alcohol use: Not Currently  . Drug use: Not Currently    Home Medications Prior to Admission medications   Medication Sig Start Date End Date Taking? Authorizing Provider  capecitabine (XELODA) 500 MG tablet TAKE 2 TABLETS (1,000 MG TOTAL) BY MOUTH 2 (TWO) TIMES DAILY AFTER A MEAL. TAKE FOR 14 DAYS, THEN HOLD FOR 7 DAYS. REPEAT EVERY 21 DAYS. 08/13/20 08/13/21  [name], MD  cholecalciferol (VITAMIN D3) 25 MCG (1000 UNIT) tablet Take 1,000 Units by mouth 2 (two) times daily.    [provider]  levETIRAcetam (KEPPRA) 500 MG tablet Take 500 mg by mouth 2 (two) times daily. 05/13/19   [provider]  levothyroxine (SYNTHROID) 100 MCG tablet Take 100 mcg by mouth daily. 05/13/19   [provider]  Melatonin 10 MG CAPS Take 10 mg by mouth at bedtime.    [provider]  metoprolol tartrate (LOPRESSOR) 25 MG tablet Take 1  tablet (25 mg total) by mouth 2 (two) times daily. 04/06/20 05/06/20  Arrien, Jimmy Picket, MD  metoprolol tartrate (LOPRESSOR) 25 MG tablet Take 25 mg by mouth 2 (two) times daily.    [provider]  ondansetron (ZOFRAN) 4 MG tablet Take 1 tablet (4 mg total) by mouth every 8 (eight) hours as needed for nausea or vomiting. 09/06/20   [name], MD  sodium bicarbonate 650 MG tablet Take 650 mg by mouth daily. 05/27/19   [provider]  vitamin B-12 1000 MCG tablet Take 1 tablet (1,000 mcg total) by mouth daily. 09/06/19   Regalado, Belkys A, MD    Allergies    Gadolinium derivatives, Amoxicillin, and Pollen extract  Review of Systems   Review of Systems  Genitourinary: Positive for difficulty urinating.  All other systems reviewed and are negative.   Physical Exam Updated Vital Signs BP (!) 107/53   Pulse 74   Temp 98.6 F (37 C) (Oral)   Resp 20   Ht 5\' 7"  (1.702 m)   Wt 44.5 kg   SpO2 94%   BMI 15.35 kg/m   Physical Exam Vitals and nursing note reviewed.  Constitutional:      Comments:  Chronically ill   HENT:     Head: Normocephalic.     Nose: Nose normal.     Mouth/Throat:     Mouth: Mucous membranes are dry.  Eyes:     Extraocular Movements: Extraocular movements intact.     Pupils: Pupils are equal, round, and reactive to light.  Cardiovascular:     Rate and Rhythm: Normal rate and regular rhythm.     Pulses: Normal pulses.     Heart sounds: Normal heart sounds.  Pulmonary:     Effort: Pulmonary effort is normal.     Breath sounds: Normal breath sounds.  Abdominal:     General: Abdomen is flat.     Palpations: Abdomen is soft.     Comments: R nephrostomy tube in place.  Suprapubic catheter with no drainage  Musculoskeletal:        General: Normal range of motion.     Cervical back: Normal range of motion and neck supple.  Skin:    General: Skin is warm.     Capillary Refill: Capillary refill takes less than 2 seconds.   Neurological:     General: No focal deficit present.     Mental Status: She is alert and oriented to person, place, and time.  Psychiatric:        Mood and Affect: Mood normal.        Behavior: Behavior normal.     ED Results / Procedures / Treatments   Labs (all labs ordered are listed, but only abnormal results are displayed) Labs Reviewed  CBC WITH DIFFERENTIAL/PLATELET - Abnormal; Notable for the following components:      Result Value   WBC 13.5 (*)    RBC 2.55 (*)    Hemoglobin 8.5 (*)    HCT 26.3 (*)    MCV 103.1 (*)    Neutro Abs 12.5 (*)    Lymphs Abs 0.4 (*)    All other components within normal limits  COMPREHENSIVE METABOLIC PANEL - Abnormal; Notable for the following components:   Sodium 132 (*)    Potassium 2.8 (*)    Chloride 92 (*)    Glucose, Bld 100 (*)    BUN 90 (*)    Creatinine, Ser 5.30 (*)    Calcium 7.4 (*)    Albumin 2.4 (*)    GFR, Estimated 7 (*)    Anion gap 17 (*)    All other components within normal limits  URINALYSIS, ROUTINE W REFLEX MICROSCOPIC - Abnormal; Notable for the following components:   APPearance CLOUDY (*)    Hgb urine dipstick LARGE (*)    Protein, ur 30 (*)    Nitrite POSITIVE (*)    Leukocytes,Ua LARGE (*)    All other components within normal limits  URINALYSIS, MICROSCOPIC (REFLEX) - Abnormal; Notable for the following components:   Bacteria, UA MANY (*)    All other components within normal limits  URINE CULTURE  RESP PANEL BY RT-PCR (FLU A&B, COVID) [id number]    EKG None  Radiology CT ABDOMEN PELVIS WO CONTRAST  Result Date: 09/22/2020 CLINICAL DATA:  Nausea and vomiting, flank pain, suprapubic catheter not draining, history of pancreatic cancer EXAM: CT ABDOMEN AND PELVIS WITHOUT CONTRAST TECHNIQUE: Multidetector CT imaging of the abdomen and pelvis was performed following the standard protocol without IV contrast. COMPARISON:  07/24/2020, 09/09/2020 FINDINGS: Lower chest: Pectus excavatum deformity again  noted. No acute pleural or parenchymal lung disease. Unenhanced CT was performed per clinician order. Lack of IV contrast  limits sensitivity and specificity, especially for evaluation of abdominal/pelvic solid viscera. Hepatobiliary: Unenhanced imaging of the liver demonstrates diffuse pneumobilia consistent with patency of the indwelling metallic common bile duct stent. Pigtail drainage catheter is seen in the region of the gallbladder fundus. Mild pericholecystic fluid and gallbladder wall thickening. Pancreas: Evaluation of the pancreas is limited without contrast. Cystic areas in the pancreatic tail are unchanged. Ill-defined soft tissue prominence in the pancreatic head, traversed by the metallic stent, consistent with known pancreatic cancer. Stable pancreatic duct dilation. Spleen: Normal in size without focal abnormality. Adrenals/Urinary Tract: Marked bilateral renal cortical atrophy. Diverting urostomy right lower quadrant with indwelling Foley catheter. Mild distension of the bilateral renal collecting systems and ureters, with diffuse uroepithelial thickening, unchanged since prior exam. Gas and fluid are seen within the neobladder. Stomach/Bowel: No bowel obstruction or ileus. No bowel wall thickening or inflammatory change. Diverticulosis of the sigmoid colon without diverticulitis. Vascular/Lymphatic: Stable atherosclerosis of the aorta. No gross lymphadenopathy on this unenhanced exam. Reproductive: Status post hysterectomy. No adnexal masses. Other: No free fluid or free intraperitoneal gas. No abdominal wall hernia. Musculoskeletal: No acute or destructive bony lesions. The patient is cachectic. Reconstructed images demonstrate no additional findings. IMPRESSION: 1. Stable ill-defined mass in the pancreatic head, traversed by a metallic biliary stent, consistent with known pancreatic cancer. 2. Pneumobilia consistent with stent patency. 3. Stable percutaneous cholecystostomy, with persistent wall  thickening of the gallbladder and trace pericholecystic fluid. 4. Postsurgical changes from cystectomy and urinary diversion. Chronic mild bilateral hydronephrosis and hydroureter with uroepithelial thickening unchanged. There is moderate gas and fluid within the neobladder, with indwelling Foley catheter. 5.  Aortic Atherosclerosis (ICD10-I70.0). Electronically Signed   By: Randa Ngo M.D.   On: 09/22/2020 18:31    Procedures Procedures  Bladder irrigation  I irrigated the Foley with 1 L of normal saline.  There was a lot of sediment that was clogging the Foley.  After manual irrigation about 500 cc of cloudy urine came out.  No complications   CRITICAL CARE Performed by: Wandra Arthurs   Total critical care time: 30 minutes  Critical care time was exclusive of separately billable procedures and treating other patients.  Critical care was necessary to treat or prevent imminent or life-threatening deterioration.  Critical care was time spent personally by me on the following activities: development of treatment plan with patient and/or surrogate as well as nursing, discussions with consultants, evaluation of patient's response to treatment, examination of patient, obtaining history from patient or surrogate, ordering and performing treatments and interventions, ordering and review of laboratory studies, ordering and review of radiographic studies, pulse oximetry and re-evaluation of patient's condition.  Medications Ordered in ED Medications  potassium chloride 10 mEq in 100 mL IVPB (10 mEq Intravenous New Bag/Given 09/22/20 1831)  sodium chloride 0.9 % bolus 1,000 mL (1,000 mLs Intravenous New Bag/Given 09/22/20 1724)  cefTRIAXone (ROCEPHIN) 1 g in sodium chloride 0.9 % 100 mL IVPB (0 g Intravenous Stopped 09/22/20 1812)  potassium chloride (KLOR-CON) packet 40 mEq (40 mEq Oral Given 09/22/20 1834)    ED Course  I have reviewed the triage vital signs and the nursing notes.  Pertinent  labs & imaging results that were available during my care of the patient were reviewed by me and considered in my medical decision making (see chart for details).    MDM Rules/Calculators/A&P  AULANI SHIPTON is a 87 y.o. female with recent AKI here presenting with clogged Foley.  There is a lot of sediment in the Foley.  I was able to manually irrigated with normal saline.  I think likely colonization versus UTI.  She has your recurrent UTIs and most recent urine culture from 2 weeks ago has multiple resistant organisms and showed Enterococcus.  It is however sensitive to Rocephin and Keflex so we will check kidney function and CBC and give Rocephin.  We will send off urine culture again  6:34 PM Patient's creatinine is up to 5.3.  Her potassium is 2.8 and she was given IV fluids.  Patient's creatinine was 2.7 on discharge 2 weeks ago.  CT showed no hydronephrosis.  She does have a known pancreatic cancer.  She was given Rocephin for UTI as well.  Patient is agreeable for admission for hydration.  She is DNR and is on hospice and does not want aggressive treatment.   Final Clinical Impression(s) / ED Diagnoses Final diagnoses:  None    Rx / DC Orders ED Discharge Orders    None       Drenda Freeze, MD 09/22/20 1836

## 2020-09-22 NOTE — ED Notes (Signed)
Pt requested to call Lelon Frohlich (in patient contacts) to make her aware of where patient is, Lelon Frohlich reports she was already contacted by [facility] and aware of patients location.

## 2020-09-22 NOTE — ED Notes (Signed)
Patient transported to CT 

## 2020-09-22 NOTE — ED Triage Notes (Signed)
Arrives via [facility] From [place] at [place] [facility] where staff reports patients catheter ha not drained last night or today.

## 2020-09-22 NOTE — ED Notes (Signed)
Pt placed on 2L O2 d/t desats while resting comfortably.  87% on RA, improved to 94% on 2L.

## 2020-09-23 ENCOUNTER — Encounter (HOSPITAL_COMMUNITY): Payer: Self-pay | Admitting: Internal Medicine

## 2020-09-23 ENCOUNTER — Inpatient Hospital Stay (HOSPITAL_COMMUNITY): Payer: Medicare Other

## 2020-09-23 DIAGNOSIS — N179 Acute kidney failure, unspecified: Secondary | ICD-10-CM

## 2020-09-23 DIAGNOSIS — E876 Hypokalemia: Secondary | ICD-10-CM

## 2020-09-23 LAB — MAGNESIUM: Magnesium: 1.9 mg/dL (ref 1.7–2.4)

## 2020-09-23 LAB — BASIC METABOLIC PANEL
Anion gap: 15 (ref 5–15)
Anion gap: 13 (ref 5–15)
BUN: 82 mg/dL — ABNORMAL HIGH (ref 8–23)
BUN: 76 mg/dL — ABNORMAL HIGH (ref 8–23)
CO2: 19 mmol/L — ABNORMAL LOW (ref 22–32)
CO2: 18 mmol/L — ABNORMAL LOW (ref 22–32)
Calcium: 7 mg/dL — ABNORMAL LOW (ref 8.9–10.3)
Calcium: 6.6 mg/dL — ABNORMAL LOW (ref 8.9–10.3)
Chloride: 101 mmol/L (ref 98–111)
Chloride: 104 mmol/L (ref 98–111)
Creatinine, Ser: 4.87 mg/dL — ABNORMAL HIGH (ref 0.44–1.00)
Creatinine, Ser: 4.43 mg/dL — ABNORMAL HIGH (ref 0.44–1.00)
GFR, Estimated: 8 mL/min — ABNORMAL LOW (ref >60)
GFR, Estimated: 9 mL/min — ABNORMAL LOW (ref >60)
Glucose, Bld: 81 mg/dL (ref 70–99)
Glucose, Bld: 86 mg/dL (ref 70–99)
Potassium: 4 mmol/L (ref 3.5–5.1)
Potassium: 3.6 mmol/L (ref 3.5–5.1)
Sodium: 135 mmol/L (ref 135–145)
Sodium: 135 mmol/L (ref 135–145)

## 2020-09-23 LAB — RETICULOCYTES
Immature Retic Fract: 14.2 % (ref 2.3–15.9)
RBC.: 2.16 MIL/uL — ABNORMAL LOW (ref 3.87–5.11)
Retic Count, Absolute: 35.4 10*3/uL (ref 19.0–186.0)
Retic Ct Pct: 1.6 % (ref 0.4–3.1)

## 2020-09-23 LAB — CBC
HCT: 24.2 % — ABNORMAL LOW (ref 36.0–46.0)
Hemoglobin: 7.7 g/dL — ABNORMAL LOW (ref 12.0–15.0)
MCH: 33.3 pg (ref 26.0–34.0)
MCHC: 31.8 g/dL (ref 30.0–36.0)
MCV: 104.8 fL — ABNORMAL HIGH (ref 80.0–100.0)
Platelets: 188 10*3/uL (ref 150–400)
RBC: 2.31 MIL/uL — ABNORMAL LOW (ref 3.87–5.11)
RDW: 14.8 % (ref 11.5–15.5)
WBC: 9.5 10*3/uL (ref 4.0–10.5)
nRBC: 0 % (ref 0.0–0.2)

## 2020-09-23 LAB — IRON AND TIBC
Iron: 32 ug/dL (ref 28–170)
Saturation Ratios: 20 % (ref 10.4–31.8)
TIBC: 160 ug/dL — ABNORMAL LOW (ref 250–450)
UIBC: 128 ug/dL

## 2020-09-23 LAB — FERRITIN: Ferritin: 584 ng/mL — ABNORMAL HIGH (ref 11–307)

## 2020-09-23 LAB — FOLATE: Folate: 15.2 ng/mL (ref >5.9)

## 2020-09-23 LAB — VITAMIN B12: Vitamin B-12: 3242 pg/mL — ABNORMAL HIGH (ref 180–914)

## 2020-09-23 IMAGING — CT CT ABD-PELV W/O CM
2 of 4 series · 16 of 46 positions shown, 18 images · non-contrast
Comparison: [DATE]

CLINICAL DATA: Abdominal distension, catheter within neobladder not
draining

EXAM:
CT ABDOMEN AND PELVIS WITHOUT CONTRAST
TECHNIQUE: Multidetector CT imaging of the abdomen and pelvis was performed
following the standard protocol without IV contrast.

[Series 11: abd/ pelvis 5.0 i30f 2 · axial · 0.82mm/px · z∈[+742,+1117]mm · 13 of 83 slices shown, 15 images]
[im 4/83  soft-tissue]
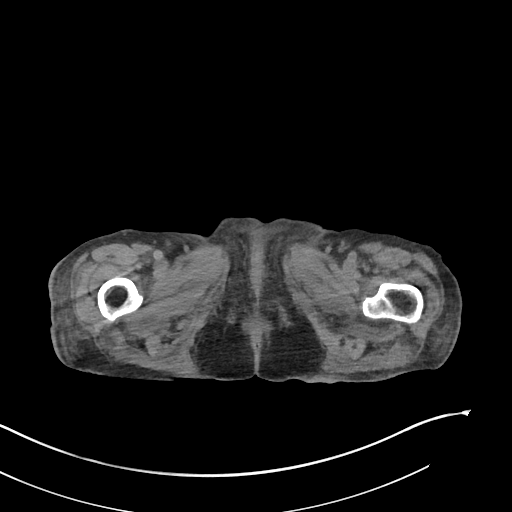
[im 4/83  bone]
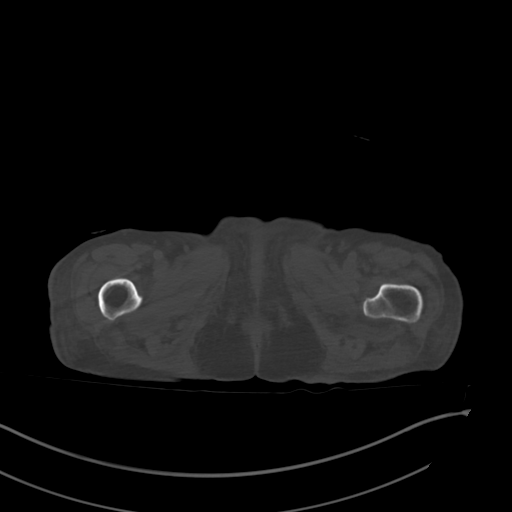
[im 10/83  soft-tissue]
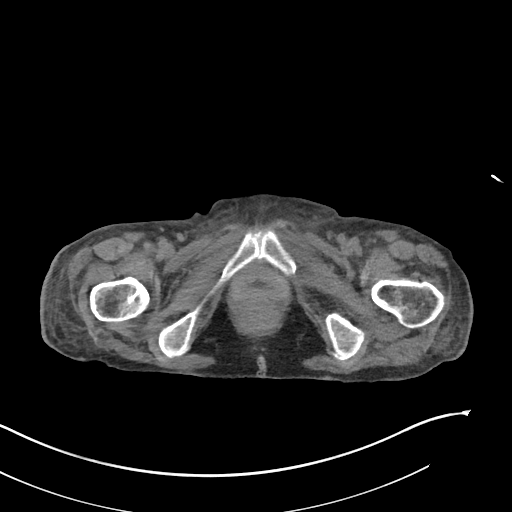
[im 16/83  soft-tissue]
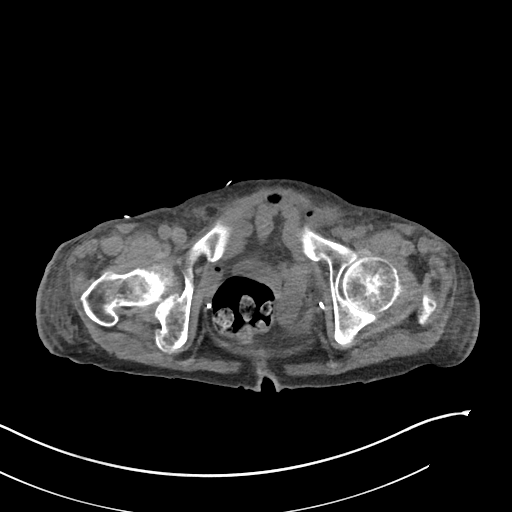
[im 23/83  soft-tissue]
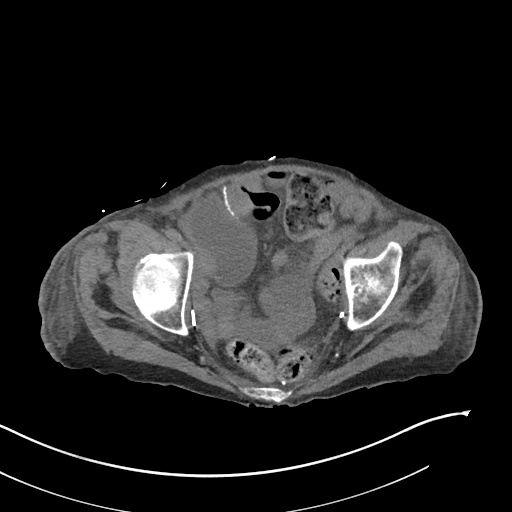
[im 29/83  soft-tissue]
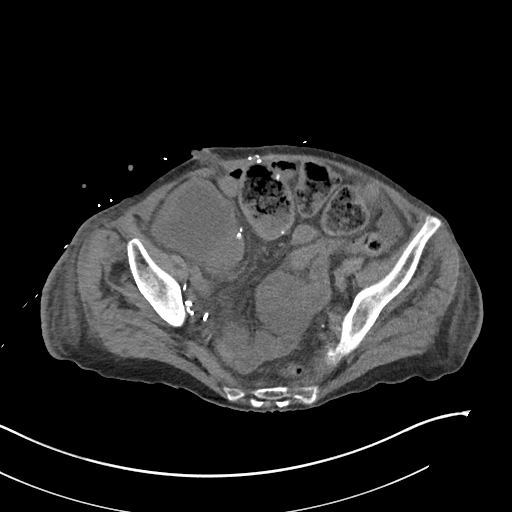
[im 35/83  soft-tissue]
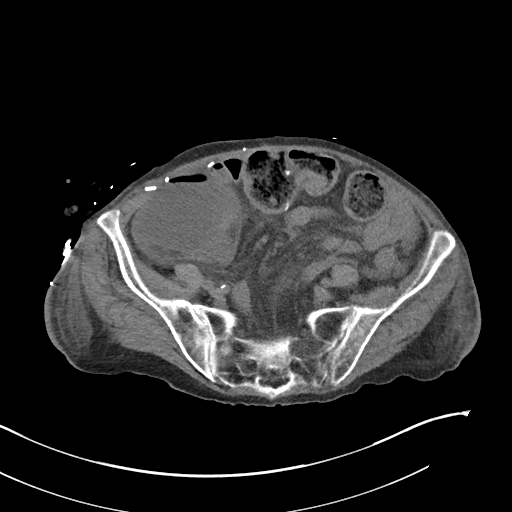
[im 42/83  soft-tissue]
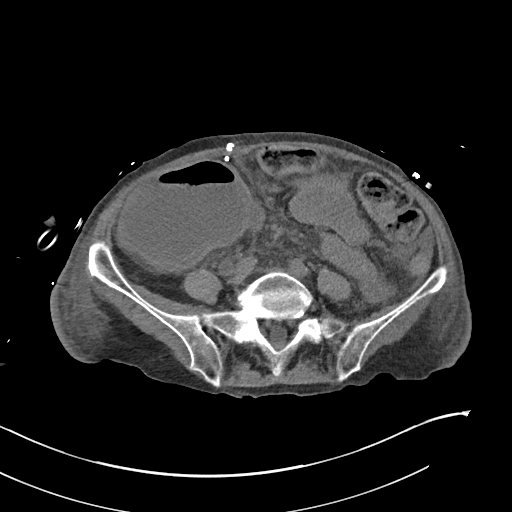
[im 48/83  soft-tissue]
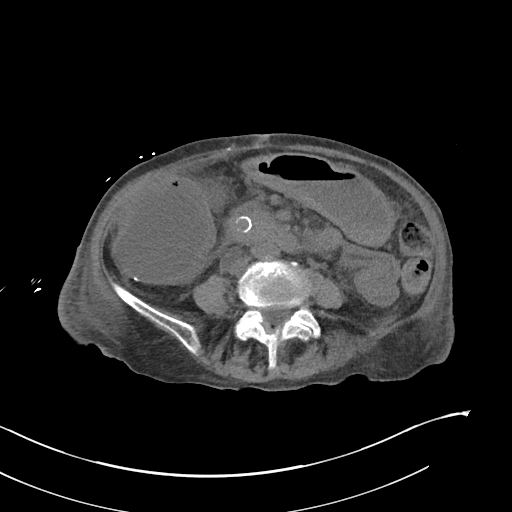
[im 54/83  soft-tissue]
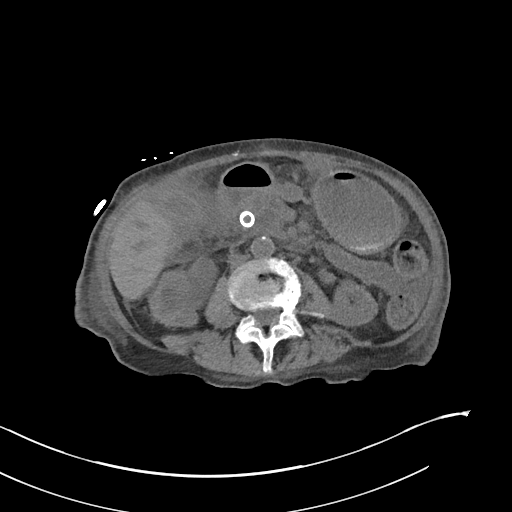
[im 54/83  bone]
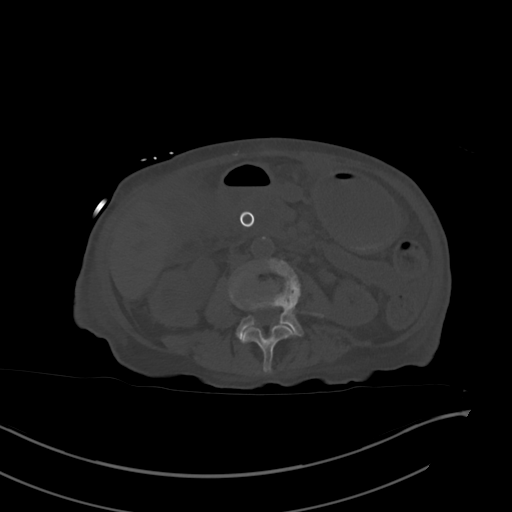
[im 60/83  soft-tissue]
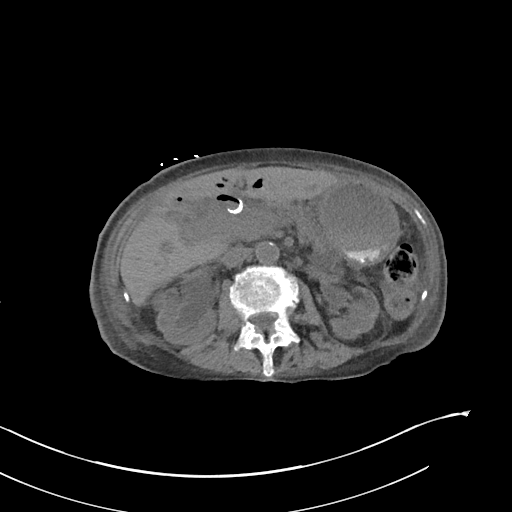
[im 67/83  soft-tissue]
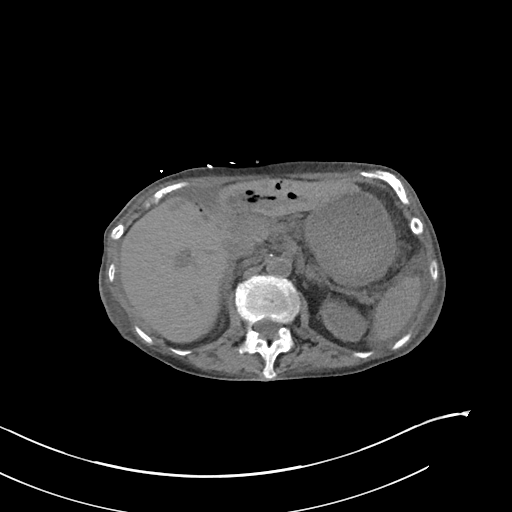
[im 73/83  soft-tissue]
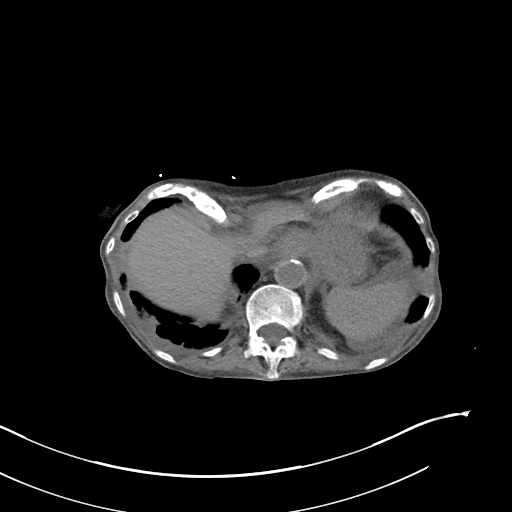
[im 79/83  soft-tissue]
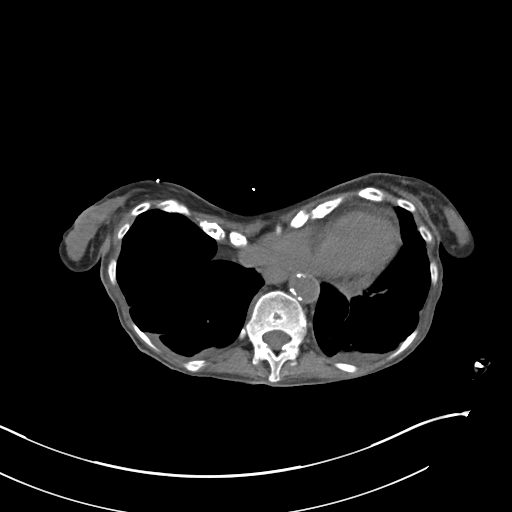

[Series 14: cor st · coronal · 0.76mm/px · 3 of 80 slices shown]
[im 27/80  soft-tissue]
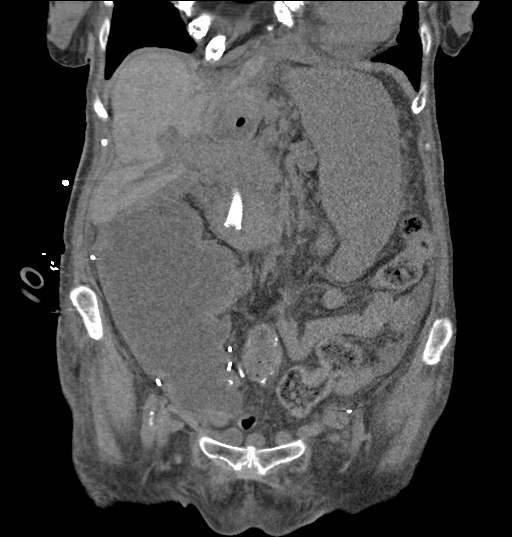
[im 36/80  soft-tissue]
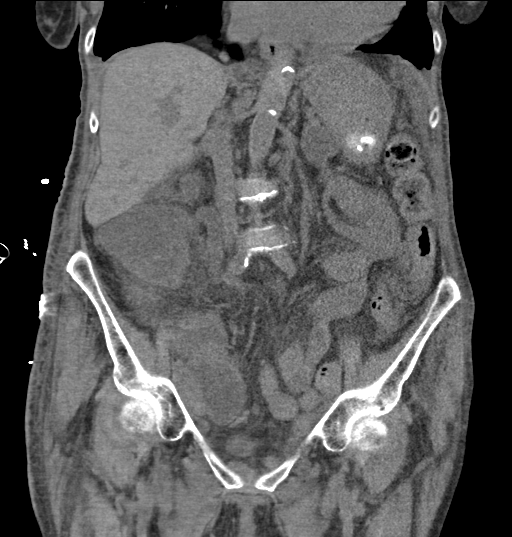
[im 44/80  soft-tissue]
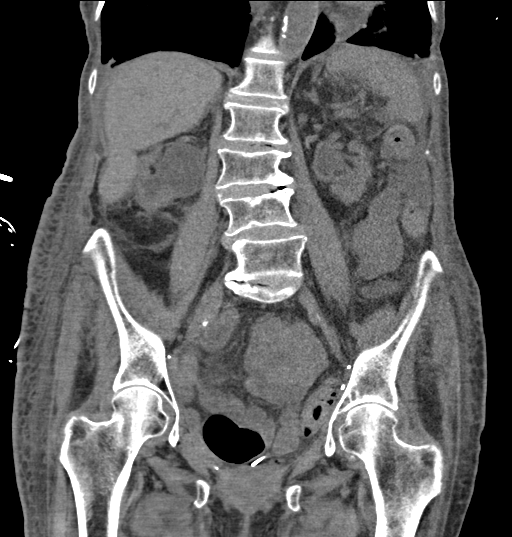

[16 of 46 positions shown; findings below may reference images not displayed]

FINDINGS: Lower chest: No acute pleural or parenchymal lung disease. Dependent
lower lobe atelectasis.

Hepatobiliary: Stable pneumobilia consistent with patent metallic
biliary stent. Remainder of the liver is unremarkable. Percutaneous
cholecystostomy tube again noted unchanged. Pericholecystic fluid
and gallbladder wall thickening again noted unchanged.

The metallic common bile duct stent is unchanged.

Pancreas: Amorphous soft tissue mass within the pancreatic head
consistent with known neoplasm, unchanged. Cystic areas within the
pancreatic tail are stable. Stable pancreatic duct dilation.

Spleen: Normal in size without focal abnormality.

Adrenals/Urinary Tract: The Foley catheter within the neobladder has
been removed. There is increased distension, with worsening
right-sided hydronephrosis and hydroureter. Bilateral renal atrophy
unchanged.

Stomach/Bowel: No bowel obstruction or ileus. No bowel wall
thickening.

Vascular/Lymphatic: Extensive atherosclerosis unchanged. No
pathologic adenopathy.

Reproductive: Status post hysterectomy. No adnexal masses.

Other: Trace free fluid within the paracolic gutters. No free
intraperitoneal gas.

Musculoskeletal: No acute or destructive bony lesions. Reconstructed
images demonstrate no additional findings.
IMPRESSION: 1. Interval removal of the Foley catheter within the neobladder,
with increased neo bladder distension. This results in worsening
right-sided hydronephrosis and hydroureter.
2. Stable common bile duct stent, with pneumobilia confirming stent
patency.
3. Stable ill-defined pancreatic head mass consistent with known
pancreatic cancer.
4. Stable percutaneous cholecystostomy.
5.  Aortic Atherosclerosis ([TQ]-[TQ]).

These results were called by telephone at the time of interpretation
on [DATE] at [DATE] to provider DR ISKRA, who verbally
acknowledged these results.

## 2020-09-23 MED ORDER — SODIUM CHLORIDE 0.9 % IV BOLUS
500.0000 mL | Freq: Once | INTRAVENOUS | Status: AC
  Administered 2020-09-23: 500 mL via INTRAVENOUS

## 2020-09-23 MED ORDER — METOPROLOL TARTRATE 25 MG PO TABS
25.0000 mg | ORAL_TABLET | Freq: Two times a day (BID) | ORAL | Status: DC
  Administered 2020-09-25: 25 mg via ORAL
  Filled 2020-09-23 (×2): qty 1

## 2020-09-23 MED ORDER — SODIUM CHLORIDE 0.9 % IV SOLN
1.0000 g | INTRAVENOUS | Status: DC
  Administered 2020-09-23 – 2020-09-25 (×3): 1 g via INTRAVENOUS
  Filled 2020-09-23: qty 10
  Filled 2020-09-23: qty 1
  Filled 2020-09-23: qty 10

## 2020-09-23 MED ORDER — ADULT MULTIVITAMIN W/MINERALS CH
1.0000 | ORAL_TABLET | Freq: Every day | ORAL | Status: DC
  Administered 2020-09-23 – 2020-09-25 (×3): 1 via ORAL
  Filled 2020-09-23 (×3): qty 1

## 2020-09-23 MED ORDER — ACETAMINOPHEN 325 MG PO TABS
650.0000 mg | ORAL_TABLET | Freq: Four times a day (QID) | ORAL | Status: DC | PRN
  Administered 2020-09-23: 650 mg via ORAL
  Filled 2020-09-23: qty 2

## 2020-09-23 MED ORDER — VITAMIN B-12 1000 MCG PO TABS
1000.0000 ug | ORAL_TABLET | Freq: Every day | ORAL | Status: DC
  Administered 2020-09-23 – 2020-09-25 (×3): 1000 ug via ORAL
  Filled 2020-09-23 (×3): qty 1

## 2020-09-23 MED ORDER — ENSURE ENLIVE PO LIQD
237.0000 mL | Freq: Three times a day (TID) | ORAL | Status: DC
  Administered 2020-09-23 – 2020-09-24 (×2): 237 mL via ORAL
  Filled 2020-09-23: qty 237

## 2020-09-23 MED ORDER — MELATONIN 5 MG PO TABS: 10.0000 mg | ORAL_TABLET | Freq: Every day | ORAL | Status: DC

## 2020-09-23 MED ORDER — SODIUM CHLORIDE 0.9 % IV SOLN: INTRAVENOUS | Status: DC

## 2020-09-23 MED ORDER — HEPARIN SODIUM (PORCINE) 5000 UNIT/ML IJ SOLN
5000.0000 [IU] | Freq: Three times a day (TID) | INTRAMUSCULAR | Status: DC
  Administered 2020-09-23 (×3): 5000 [IU] via SUBCUTANEOUS
  Filled 2020-09-23 (×4): qty 1

## 2020-09-23 MED ORDER — LEVETIRACETAM 250 MG PO TABS
250.0000 mg | ORAL_TABLET | Freq: Two times a day (BID) | ORAL | Status: DC
  Administered 2020-09-23 – 2020-09-24 (×3): 250 mg via ORAL
  Filled 2020-09-23 (×5): qty 1

## 2020-09-23 MED ORDER — ENSURE ENLIVE PO LIQD
237.0000 mL | Freq: Two times a day (BID) | ORAL | Status: DC
  Administered 2020-09-23: 237 mL via ORAL

## 2020-09-23 MED ORDER — LEVOTHYROXINE SODIUM 100 MCG PO TABS
100.0000 ug | ORAL_TABLET | Freq: Every day | ORAL | Status: DC
  Administered 2020-09-23 – 2020-09-25 (×2): 100 ug via ORAL
  Filled 2020-09-23 (×3): qty 1

## 2020-09-23 MED ORDER — ACETAMINOPHEN 650 MG RE SUPP: 650.0000 mg | Freq: Four times a day (QID) | RECTAL | Status: DC | PRN

## 2020-09-23 MED ORDER — SODIUM CHLORIDE 0.9 % IV BOLUS
250.0000 mL | Freq: Once | INTRAVENOUS | Status: AC
  Administered 2020-09-23: 250 mL via INTRAVENOUS

## 2020-09-23 NOTE — Plan of Care (Signed)

## 2020-09-23 NOTE — Progress Notes (Addendum)
Patient seen and examined personally, I reviewed the chart, history and physical and admission note, done by admitting physician this morning and agree with the same with following addendum.  Please refer to the morning admission note for more detailed plan of care.  Briefly,  87 year old female with pancreatic cancer followed by Dr. Marin Olp, PAF not on anticoagulations due to risk of bleeding, chronic kidney disease history of bladder cancer status post diversion doing self cath from abdominal stoma brought to the ED due to clogged suprapubic catheter.  Patient also had a recent fall a week ago since then having difficulty ambulating with some hip pain  In the ED suprapubic catheter was related and started draining urine labs showed acute renal failure hyperkalemia leukocytosis UA with positive for nitrates bacteria and WBC, started IV with hydration antibiotic and admitted.  CT abdomen did not show acute finding.  This am  Chronic Foley was flushed this morning and 375 ml of urine with cloudy sediment fluid out Afebrile overnight blood pressure 90s to low 100 Leukocytosis improved, creatinine down trended but still up Patient resting comfortably asymptomatic. Blood pressure soft at 88 sbp  Issues being addressed  AKI on CKD IV, baseline creat  1.7 08/30/20.  Due to obstructive suprapubic catheter, now draining urine-asked the nursing to monitor.  Cont on gentle IV fluid hydration.  Giving bolus for hypotension, avoid hypotension. Recent Labs  Lab 09/22/20 1723 09/23/20 0233  BUN 90* 82*  CREATININE 5.30* 4.87*   Leukocytosis resolved Hypokalemia: Repleted Possible UTI-follow-up culture continue empiric ceftriaxone Anemia, ?cause- hemoglobin downtrending-Baseline hemoglobin appears to be around 9 to 10 g back in March and February.  Check anemia panel ,suspect from chronic disease Recent Labs  Lab 09/22/20 1723 09/23/20 0233  HGB 8.5* 7.7*  HCT 26.3* 24.2*   History of seizure on  Keppra Hypothyroidism on Synthroid PAF on metoprolol (with holding parameters) not on anticoagulation due to risk of bleeding Pancreatic cancer follows w/ Dr. Marin Olp; able to hold chemo only for 2 weeks and has stopped since.  Right lower extremity pain from recent fall CT abdomen pelvis showed no acute fracture. Hypotension: We will give for 556mL bolus x1 monitor  She is from [facility] and will remain in the hospital at least next 2 days, PT OT eval once stable

## 2020-09-23 NOTE — Consult Note (Signed)
Urology Consult   Physician requesting consult: Antonieta Pert  Reason for consult: Nonfunctioning Foley and bladder pouch  History of Present Illness: Glenda King is a 87 y.o. patient is a 87 year old white female with history of pancreatic cancer and also a remote history of bladder cancer which required radical cystectomy and urinary diversion with a continent catheterizable pouch.  This was done approximately 20 years ago in [place].  Patient had been catheterizing her stoma every 4-6 hours and had done well until recent admission to the hospital with nausea and vomiting and nonfunctioning catheter that it was placed in the pouch.  Creatinine elevated to 5.3.  Patient had a Foley catheter placed in the catheterizable limb into the pouch and seen to be draining intermittently and nurses were unable to flush the catheter earlier today.  Patient's abdomen became markedly distended later in the morning and a repeat CT scan this afternoon it showed marked distention of the catheterizable pouch and the Foley has been withdrawn outside the pouch.  Subsequently 30 French Foley was passed through the catheterizable limb of the stoma easily into the bladder pouch.  Proximately 1000 cc of urine with some mucus was obtained but was clear.  Foley was secured with 10 cc balloon inside the pouch and then secured to the skin.  She denies a history of voiding or storage urinary symptoms, hematuria, UTIs, STDs, urolithiasis, GU malignancy/trauma/surgery.  Past Medical History:  Diagnosis Date  . Anemia   . Cancer [facility])    bladder cancer  . Cardiomyopathy ([place])   . CKD (chronic kidney disease)   . Goals of care, counseling/discussion 12/10/2019  . Primary pancreatic cancer ([place]) 12/10/2019  . Thyroid disease    hypothyroid    Past Surgical History:  Procedure Laterality Date  . BALLOON DILATION N/A 04/12/2020   Procedure: BALLOON DILATION;  Surgeon: Jackquline Denmark, MD;  Location: [facility];   Service: Endoscopy;  Laterality: N/A;  duodenal stricture   . BILIARY STENT PLACEMENT N/A 09/05/2019   Procedure: BILIARY STENT PLACEMENT;  Surgeon: Irene Shipper, MD;  Location: [facility];  Service: Endoscopy;  Laterality: N/A;  . BILIARY STENT PLACEMENT N/A 04/12/2020   Procedure: BILIARY STENT PLACEMENT;  Surgeon: Jackquline Denmark, MD;  Location: [facility];  Service: Endoscopy;  Laterality: N/A;  . BIOPSY  12/03/2019   Procedure: BIOPSY;  Surgeon: Rush Landmark Telford Nab., MD;  Location: [facility];  Service: Gastroenterology;;  . ERCP N/A 09/05/2019   Procedure: ENDOSCOPIC RETROGRADE CHOLANGIOPANCREATOGRAPHY (ERCP);  Surgeon: Irene Shipper, MD;  Location[facility];  Service: Endoscopy;  Laterality: N/A;  . ERCP N/A 04/12/2020   Procedure: ENDOSCOPIC RETROGRADE CHOLANGIOPANCREATOGRAPHY (ERCP);  Surgeon: Jackquline Denmark, MD;  Location: [facility];  Service: Endoscopy;  Laterality: N/A;  with stent exchange  . ESOPHAGOGASTRODUODENOSCOPY (EGD) WITH PROPOFOL N/A 12/03/2019   Procedure: ESOPHAGOGASTRODUODENOSCOPY (EGD) WITH PROPOFOL;  Surgeon: Rush Landmark Telford Nab., MD;  Location: [facility];  Service: Gastroenterology;  Laterality: N/A;  . EUS N/A 12/03/2019   Procedure: UPPER ENDOSCOPIC ULTRASOUND (EUS) LINEAR;  Surgeon: Irving Copas., MD;  [facility];  Service: Gastroenterology;  Laterality: N/A;  . FINE NEEDLE ASPIRATION N/A 12/03/2019   Procedure: FINE NEEDLE ASPIRATION (FNA) LINEAR;  Surgeon: Irving Copas., MD;  [facility]: [facility];  Service: Gastroenterology;  Laterality: N/A;  . IR CATHETER TUBE CHANGE  05/28/2020  . IR EXCHANGE BILIARY DRAIN  06/07/2020  . IR EXCHANGE BILIARY DRAIN  07/23/2020  . IR EXCHANGE BILIARY DRAIN  08/05/2020  . IR  PERC CHOLECYSTOSTOMY  04/08/2020  . IR RADIOLOGIST EVAL & MGMT  04/29/2020  . SPHINCTEROTOMY  09/05/2019   Procedure: SPHINCTEROTOMY;  Surgeon: Irene Shipper, MD;  Location: [facility];  Service: Endoscopy;;  .  Lavell Islam REMOVAL  04/12/2020   Procedure: STENT REMOVAL;  Surgeon: Jackquline Denmark, MD;  Location: [facility];  Service: Endoscopy;;     Current Hospital Medications:  Home meds:  No current facility-administered medications on file prior to encounter.   Current Outpatient Medications on File Prior to Encounter  Medication Sig Dispense Refill  . levETIRAcetam (KEPPRA) 500 MG tablet Take 500 mg by mouth 2 (two) times daily.    Marland Kitchen LORazepam (ATIVAN) 0.5 MG tablet Take 0.25 mg by mouth every 4 (four) hours as needed for anxiety.    . Morphine Sulfate (MORPHINE CONCENTRATE) 10 mg / 0.5 ml concentrated solution Take 5 mg by mouth every 4 (four) hours as needed for shortness of breath or severe pain.    Marland Kitchen ondansetron (ZOFRAN) 4 MG tablet Take 1 tablet (4 mg total) by mouth every 8 (eight) hours as needed for nausea or vomiting. 30 tablet 2  . capecitabine (XELODA) 500 MG tablet TAKE 2 TABLETS (1,000 MG TOTAL) BY MOUTH 2 (TWO) TIMES DAILY AFTER A MEAL. TAKE FOR 14 DAYS, THEN HOLD FOR 7 DAYS. REPEAT EVERY 21 DAYS. (Patient not taking: Reported on 09/23/2020) 56 tablet 4  . levothyroxine (SYNTHROID) 100 MCG tablet Take 100 mcg by mouth daily. (Patient not taking: Reported on 09/23/2020)    . Melatonin 10 MG CAPS Take 10 mg by mouth at bedtime. (Patient not taking: Reported on 09/23/2020)    . metoprolol tartrate (LOPRESSOR) 25 MG tablet Take 1 tablet (25 mg total) by mouth 2 (two) times daily. 60 tablet 0  . metoprolol tartrate (LOPRESSOR) 25 MG tablet Take 25 mg by mouth 2 (two) times daily. (Patient not taking: Reported on 09/23/2020)    . vitamin B-12 1000 MCG tablet Take 1 tablet (1,000 mcg total) by mouth daily. (Patient not taking: Reported on 09/23/2020) 30 tablet 0     Scheduled Meds: . feeding supplement  237 mL Oral TID BM  . heparin  5,000 Units Subcutaneous Q8H  . levETIRAcetam  250 mg Oral BID  . levothyroxine  100 mcg Oral QAC breakfast  . melatonin  10 mg Oral QHS  . metoprolol tartrate  25  mg Oral BID  . multivitamin with minerals  1 tablet Oral Daily  . cyanocobalamin  1,000 mcg Oral Daily   Continuous Infusions: . sodium chloride Stopped (09/23/20 1108)  . cefTRIAXone (ROCEPHIN)  IV 1 g (09/23/20 1646)  . sodium chloride     PRN Meds:.acetaminophen **OR** acetaminophen  Allergies:  Allergies  Allergen Reactions  . Gadolinium Derivatives Other (See Comments)    Secondary to her stage IV kidney disease   . Amoxicillin Other (See Comments)    UNKNOWN REACTION     . Pollen Extract Other (See Comments)    Sneezing    Family History  Problem Relation Age of Onset  . Hypertension Other     Social History:  reports that she has never smoked. She has never used smokeless tobacco. She reports previous alcohol use. She reports previous drug use.  ROS: A complete review of systems was performed.  All systems are negative except for pertinent findings as noted.  Physical Exam:  Vital signs in last 24 hours: Temp:  [97.6 F (36.4 C)-98.6 F (37 C)] 97.6 F (36.4 C) (04/14  1943) Pulse Rate:  [58-80] 67 (04/14 1943) Resp:  [16-20] 18 (04/14 1943) BP: (81-114)/(40-74) 103/48 (04/14 1943) SpO2:  [96 %-100 %] 100 % (04/14 1943) Weight:  [44.1 kg] 44.1 kg (04/13 2330) Constitutional:  Alert and oriented, No acute distress Cardiovascular: Regular rate and rhythm, No JVD Respiratory: Normal respiratory effort, Lungs clear bilaterally GI: Abdomen is distended with catheterizable stoma in the right lower quadrant. GU: No CVA tenderness Lymphatic: No lymphadenopathy Neurologic: Grossly intact, no focal deficits Psychiatric: Normal mood and affect  Laboratory Data:  Recent Labs    09/22/20 1723 09/23/20 0233  WBC 13.5* 9.5  HGB 8.5* 7.7*  HCT 26.3* 24.2*  PLT 235 188    Recent Labs    09/22/20 1723 09/23/20 0233 09/23/20 1533  NA 132* 135 135  K 2.8* 4.0 3.6  CL 92* 101 104  GLUCOSE 100* 81 86  BUN 90* 82* 76*  CALCIUM 7.4* 7.0* 6.6*  CREATININE  5.30* 4.87* 4.43*     Results for orders placed or performed during the hospital encounter of 09/22/20 (from the past 24 hour(s))  Magnesium     Status: None   Collection Time: 09/23/20  2:33 AM  Result Value Ref Range   Magnesium 1.9 1.7 - 2.4 mg/dL  Basic metabolic panel     Status: Abnormal   Collection Time: 09/23/20  2:33 AM  Result Value Ref Range   Sodium 135 135 - 145 mmol/L   Potassium 4.0 3.5 - 5.1 mmol/L   Chloride 101 98 - 111 mmol/L   CO2 19 (L) 22 - 32 mmol/L   Glucose, Bld 81 70 - 99 mg/dL   BUN 82 (H) 8 - 23 mg/dL   Creatinine, Ser 4.87 (H) 0.44 - 1.00 mg/dL   Calcium 7.0 (L) 8.9 - 10.3 mg/dL   GFR, Estimated 8 (L) >60 mL/min   Anion gap 15 5 - 15  CBC     Status: Abnormal   Collection Time: 09/23/20  2:33 AM  Result Value Ref Range   WBC 9.5 4.0 - 10.5 K/uL   RBC 2.31 (L) 3.87 - 5.11 MIL/uL   Hemoglobin 7.7 (L) 12.0 - 15.0 g/dL   HCT 24.2 (L) 36.0 - 46.0 %   MCV 104.8 (H) 80.0 - 100.0 fL   MCH 33.3 26.0 - 34.0 pg   MCHC 31.8 30.0 - 36.0 g/dL   RDW 14.8 11.5 - 15.5 %   Platelets 188 150 - 400 K/uL   nRBC 0.0 0.0 - 0.2 %  Vitamin B12     Status: Abnormal   Collection Time: 09/23/20  7:53 AM  Result Value Ref Range   Vitamin B-12 3,242 (H) 180 - 914 pg/mL  Folate     Status: None   Collection Time: 09/23/20  7:53 AM  Result Value Ref Range   Folate 15.2 >5.9 ng/mL  Iron and TIBC     Status: Abnormal   Collection Time: 09/23/20  7:53 AM  Result Value Ref Range   Iron 32 28 - 170 ug/dL   TIBC 160 (L) 250 - 450 ug/dL   Saturation Ratios 20 10.4 - 31.8 %   UIBC 128 ug/dL  Ferritin     Status: Abnormal   Collection Time: 09/23/20  7:53 AM  Result Value Ref Range   Ferritin 584 (H) 11 - 307 ng/mL  Reticulocytes     Status: Abnormal   Collection Time: 09/23/20  7:53 AM  Result Value Ref Range   Retic Ct Pct  1.6 0.4 - 3.1 %   RBC. 2.16 (L) 3.87 - 5.11 MIL/uL   Retic Count, Absolute 35.4 19.0 - 186.0 K/uL   Immature Retic Fract 14.2 2.3 - 15.9 %   Basic metabolic panel     Status: Abnormal   Collection Time: 09/23/20  3:33 PM  Result Value Ref Range   Sodium 135 135 - 145 mmol/L   Potassium 3.6 3.5 - 5.1 mmol/L   Chloride 104 98 - 111 mmol/L   CO2 18 (L) 22 - 32 mmol/L   Glucose, Bld 86 70 - 99 mg/dL   BUN 76 (H) 8 - 23 mg/dL   Creatinine, Ser 4.43 (H) 0.44 - 1.00 mg/dL   Calcium 6.6 (L) 8.9 - 10.3 mg/dL   GFR, Estimated 9 (L) >60 mL/min   Anion gap 13 5 - 15   Recent Results (from the past 240 hour(s))  Resp Panel by RT-PCR (Flu A&B, Covid) Nasopharyngeal Swab     Status: None   Collection Time: 09/22/20  6:22 PM   Specimen: Nasopharyngeal Swab; Nasopharyngeal(NP) swabs in vial transport medium  Result Value Ref Range Status   SARS Coronavirus 2 by RT PCR NEGATIVE NEGATIVE Final    Comment: (NOTE) SARS-CoV-2 target nucleic acids are NOT DETECTED.  The SARS-CoV-2 RNA is generally detectable in upper respiratory specimens during the acute phase of infection. The lowest concentration of SARS-CoV-2 viral copies this assay can detect is 138 copies/mL. A negative result does not preclude SARS-Cov-2 infection and should not be used as the sole basis for treatment or other patient management decisions. A negative result may occur with  improper specimen collection/handling, submission of specimen other than nasopharyngeal swab, presence of viral mutation(s) within the areas targeted by this assay, and inadequate number of viral copies(<138 copies/mL). A negative result must be combined with clinical observations, patient history, and epidemiological information. The expected result is Negative.  Fact Sheet for Patients:  EntrepreneurPulse.com.au  Fact Sheet for Healthcare Providers:  IncredibleEmployment.be  This test is no t yet approved or cleared by the [place] FDA and  has been authorized for detection and/or diagnosis of SARS-CoV-2 by FDA under an Emergency Use Authorization  (EUA). This EUA will remain  in effect (meaning this test can be used) for the duration of the COVID-19 declaration under Section 564(b)(1) of the Act, 21 U.S.C.section 360bbb-3(b)(1), unless the authorization is terminated  or revoked sooner.       Influenza A by PCR NEGATIVE NEGATIVE Final   Influenza B by PCR NEGATIVE NEGATIVE Final    Comment: (NOTE) The Xpert Xpress SARS-CoV-2/FLU/RSV plus assay is intended as an aid in the diagnosis of influenza from Nasopharyngeal swab specimens and should not be used as a sole basis for treatment. Nasal washings and aspirates are unacceptable for Xpert Xpress SARS-CoV-2/FLU/RSV testing.  Fact Sheet for Patients: EntrepreneurPulse.com.au  Fact Sheet for Healthcare Providers: IncredibleEmployment.be  This test is not yet approved or cleared by the [place] FDA and has been authorized for detection and/or diagnosis of SARS-CoV-2 by FDA under an Emergency Use Authorization (EUA). This EUA will remain in effect (meaning this test can be used) for the duration of the COVID-19 declaration under Section 564(b)(1) of the Act, 21 U.S.C. section 360bbb-3(b)(1), unless the authorization is terminated or revoked.  Performed at [facility], [address], [place], [place] [address]     Renal Function: Recent Labs    09/22/20 1723 09/23/20 0233 09/23/20 1533  CREATININE 5.30* 4.87*  4.43*   Estimated Creatinine Clearance: 6 mL/min (A) (by C-G formula based on SCr of 4.43 mg/dL (H)).  Radiologic Imaging: CT ABDOMEN PELVIS WO CONTRAST  Result Date: 09/23/2020 CLINICAL DATA:  Abdominal distension, catheter within neobladder not draining EXAM: CT ABDOMEN AND PELVIS WITHOUT CONTRAST TECHNIQUE: Multidetector CT imaging of the abdomen and pelvis was performed following the standard protocol without IV contrast. COMPARISON:  09/22/2020 FINDINGS: Lower chest: No acute pleural or parenchymal lung  disease. Dependent lower lobe atelectasis. Hepatobiliary: Stable pneumobilia consistent with patent metallic biliary stent. Remainder of the liver is unremarkable. Percutaneous cholecystostomy tube again noted unchanged. Pericholecystic fluid and gallbladder wall thickening again noted unchanged. The metallic common bile duct stent is unchanged. Pancreas: Amorphous soft tissue mass within the pancreatic head consistent with known neoplasm, unchanged. Cystic areas within the pancreatic tail are stable. Stable pancreatic duct dilation. Spleen: Normal in size without focal abnormality. Adrenals/Urinary Tract: The Foley catheter within the neobladder has been removed. There is increased distension, with worsening right-sided hydronephrosis and hydroureter. Bilateral renal atrophy unchanged. Stomach/Bowel: No bowel obstruction or ileus. No bowel wall thickening. Vascular/Lymphatic: Extensive atherosclerosis unchanged. No pathologic adenopathy. Reproductive: Status post hysterectomy. No adnexal masses. Other: Trace free fluid within the paracolic gutters. No free intraperitoneal gas. Musculoskeletal: No acute or destructive bony lesions. Reconstructed images demonstrate no additional findings. IMPRESSION: 1. Interval removal of the Foley catheter within the neobladder, with increased neo bladder distension. This results in worsening right-sided hydronephrosis and hydroureter. 2. Stable common bile duct stent, with pneumobilia confirming stent patency. 3. Stable ill-defined pancreatic head mass consistent with known pancreatic cancer. 4. Stable percutaneous cholecystostomy. 5.  Aortic Atherosclerosis (ICD10-I70.0). These results were called by telephone at the time of interpretation on 09/23/2020 at 7:42 pm to provider DR Milford Cage, who verbally acknowledged these results. Electronically Signed   By: Randa Ngo M.D.   On: 09/23/2020 19:47   CT ABDOMEN PELVIS WO CONTRAST  Result Date: 09/22/2020 CLINICAL DATA:  Nausea  and vomiting, flank pain, suprapubic catheter not draining, history of pancreatic cancer EXAM: CT ABDOMEN AND PELVIS WITHOUT CONTRAST TECHNIQUE: Multidetector CT imaging of the abdomen and pelvis was performed following the standard protocol without IV contrast. COMPARISON:  07/24/2020, 09/09/2020 FINDINGS: Lower chest: Pectus excavatum deformity again noted. No acute pleural or parenchymal lung disease. Unenhanced CT was performed per clinician order. Lack of IV contrast limits sensitivity and specificity, especially for evaluation of abdominal/pelvic solid viscera. Hepatobiliary: Unenhanced imaging of the liver demonstrates diffuse pneumobilia consistent with patency of the indwelling metallic common bile duct stent. Pigtail drainage catheter is seen in the region of the gallbladder fundus. Mild pericholecystic fluid and gallbladder wall thickening. Pancreas: Evaluation of the pancreas is limited without contrast. Cystic areas in the pancreatic tail are unchanged. Ill-defined soft tissue prominence in the pancreatic head, traversed by the metallic stent, consistent with known pancreatic cancer. Stable pancreatic duct dilation. Spleen: Normal in size without focal abnormality. Adrenals/Urinary Tract: Marked bilateral renal cortical atrophy. Diverting urostomy right lower quadrant with indwelling Foley catheter. Mild distension of the bilateral renal collecting systems and ureters, with diffuse uroepithelial thickening, unchanged since prior exam. Gas and fluid are seen within the neobladder. Stomach/Bowel: No bowel obstruction or ileus. No bowel wall thickening or inflammatory change. Diverticulosis of the sigmoid colon without diverticulitis. Vascular/Lymphatic: Stable atherosclerosis of the aorta. No gross lymphadenopathy on this unenhanced exam. Reproductive: Status post hysterectomy. No adnexal masses. Other: No free fluid or free intraperitoneal gas. No abdominal wall hernia. Musculoskeletal: No acute or  destructive bony lesions. The patient is cachectic. Reconstructed images demonstrate no additional findings. IMPRESSION: 1. Stable ill-defined mass in the pancreatic head, traversed by a metallic biliary stent, consistent with known pancreatic cancer. 2. Pneumobilia consistent with stent patency. 3. Stable percutaneous cholecystostomy, with persistent wall thickening of the gallbladder and trace pericholecystic fluid. 4. Postsurgical changes from cystectomy and urinary diversion. Chronic mild bilateral hydronephrosis and hydroureter with uroepithelial thickening unchanged. There is moderate gas and fluid within the neobladder, with indwelling Foley catheter. 5.  Aortic Atherosclerosis (ICD10-I70.0). Electronically Signed   By: Randa Ngo M.D.   On: 09/22/2020 18:31    I independently reviewed the above imaging studies.  Impression/Recommendation: Retention of urine within urinary diversion pouch, now drained with indwelling Foley. Recommendation: Continue Foley to gravity drain may irrigate with normal saline as needed if Foley clogs with mucus.  Monitor renal parameters now that pouch has been decompressed.  No further intervention at this point.  Pouch was easily catheterizable and could go back to intermittent catheterization if patient is able but may need to leave Foley for now to maximize renal function and decompress the pouch. No further urologic intervention at this point.  Please reconsult if have difficulty with irrigation of the catheter or drainage of the Foley.  Remi Haggard 09/23/2020, 8:13 PM     CC:

## 2020-09-23 NOTE — Progress Notes (Signed)
Initial Nutrition Assessment  DOCUMENTATION CODES:   Severe malnutrition in context of chronic illness  INTERVENTION:  -Ensure Enlive po TID, each supplement provides 350 kcal and 20 grams of protein -Liberalize diet to regular to encourage PO intake -Magic cup TID with meals, each supplement provides 290 kcal and 9 grams of protein -MVI with minerals daily  NUTRITION DIAGNOSIS:   Severe Malnutrition related to chronic illness (cancer) as evidenced by severe fat depletion,severe muscle depletion,energy intake < or equal to 75% for > or equal to 1 month.   GOAL:   Patient will meet greater than or equal to 90% of their needs   MONITOR:   PO intake,Supplement acceptance,Labs,I & O's,Weight trends  REASON FOR ASSESSMENT:   Malnutrition Screening Tool    ASSESSMENT:   Pt from SNF admitted with acute kidney failure and clogged suprapubic catheter. PMH includes pancreatic cancer, Afib, CKD w/ h/o bladder cancer s/p dilation (does self cath from abdominal stoma). Pt also had a recent fall ~1 week ago.  Discussed pt with RN.   Pt lives at a facility which provides 3 meals per day. For breakfast, she has 1/2 a bagel with 1/2 a cup of coffee. For lunch, she "nibbles on a bar." Her dinner is more varied, but she reports eating only 1/2 of what is provided at best. Pt reports that her appetite used to be much better, but has been very poor since beginning radiation in august 2021. Pt states she hadn't used supplements PTA, but is agreeable to chocolate ensure and others while admitted.   No PO intake documented.   Reviewed weight history. No significant weight changes noted. Pt is underweight based on BMI.   Pt with Cook slip-coat biliary tube  UOP: 345ml documented today  Medications: vitamin B12 Labs reviewed. Pt's corrected calcium is below normal limits.   NUTRITION - FOCUSED PHYSICAL EXAM: Flowsheet Row Most Recent Value  Orbital Region Severe depletion  Upper Arm Region  Severe depletion  Thoracic and Lumbar Region Severe depletion  Buccal Region Severe depletion  Temple Region Severe depletion  Clavicle Bone Region Severe depletion  Clavicle and Acromion Bone Region Severe depletion  Scapular Bone Region Severe depletion  Dorsal Hand Severe depletion  Patellar Region Severe depletion  Anterior Thigh Region Severe depletion  Posterior Calf Region Severe depletion  Edema (RD Assessment) None  Hair Reviewed  Eyes Reviewed  Mouth Reviewed  Skin Reviewed  Nails Reviewed     Diet Order:   Diet Order            Diet regular Room service appropriate? Yes with Assist; Fluid consistency: Thin  Diet effective now                 EDUCATION NEEDS:   No education needs have been identified at this time  Skin:  Skin Assessment: Skin Integrity Issues: Skin Integrity Issues:: Other (Comment) Other: lacerations: R eye, forehead, jaw  Last BM:  PTA  Height:   Ht Readings from Last 1 Encounters:  09/22/20 5\' 7"  (1.702 m)    Weight:   Wt Readings from Last 1 Encounters:  09/22/20 44.1 kg    BMI:  Body mass index is 15.23 kg/m.  Estimated Nutritional Needs:   Kcal:  1400-1600  Protein:  70-80 grams  Fluid:  >1.4L/d    Larkin Ina, MS, RD, LDN RD pager number and weekend/on-call pager number located in [place].

## 2020-09-23 NOTE — Progress Notes (Signed)
Patient had her chronic foley flushed with 50 ml of sterile fluid at 0630 this morning with 375 ml output.  It is 1430 and patient has had no urine output and her stomach is very distended.  Called Dr. Maren Beach and he has placed an order to flush foley Q6hrs.  I have flushed the chronic foley with 60 ml's of sterile fluid but no output has been seen.  Spoke with Dr. Milford Cage and he has asked for me to flush with another 35mls of sterile fluid and if no output to place an order for a STAT CT of abdomen.  Order placed.  Will continue to monitor.

## 2020-09-23 NOTE — H&P (Signed)
History and Physical    Glenda King XTG:[id number] DOB: 1929/05/14 DOA: 09/22/2020  PCP: Javier Glazier, MD  Patient coming from: [place].  Chief Complaint: Clogged suprapubic catheter.  HPI: Glenda King is a 87 y.o. female with history of pancreatic cancer being followed by Dr. Marin Olp oncologist, paroxysmal atrial fibrillation not on anticoagulation since the risk for bleeding as per oncologist, chronic kidney disease with history of bladder cancer status post dilation does self cath from abdominal stoma was brought to the ER at [facility] because of clogged suprapubic catheter.  Denies any nausea vomiting or diarrhea.  Denies any fever chills.  Patient also recently had a fall about a week ago since then patient has been having some difficulty ambulating with some hip pain.  ED Course: In the ER patient's suprapubic catheter was irrigated and started draining urine.  Labs done showed significantly elevated creatinine of 5.3 which increased from 1.7 about 3 weeks ago.  Potassium was 2.8.  Mild leukocytosis and urine was showing many bacteria with positive nitrites and WBC.  Patient was started on hydration and antibiotic admitted for further observation.  Covid test was negative.  Potassium replacement given.  CT of the abdomen pelvis did not show anything acute.  Review of Systems: As per HPI, rest all negative.   Past Medical History:  Diagnosis Date  . Anemia   . Cancer [facility])    bladder cancer  . Cardiomyopathy ([place])   . CKD (chronic kidney disease)   . Goals of care, counseling/discussion 12/10/2019  . Primary pancreatic cancer ([place]) 12/10/2019  . Thyroid disease    hypothyroid    Past Surgical History:  Procedure Laterality Date  . BALLOON DILATION N/A 04/12/2020   Procedure: BALLOON DILATION;  Surgeon: Jackquline Denmark, MD;  Location: [facility];  Service: Endoscopy;  Laterality: N/A;  duodenal stricture   . BILIARY STENT  PLACEMENT N/A 09/05/2019   Procedure: BILIARY STENT PLACEMENT;  Surgeon: Irene Shipper, MD;  Location: [facility];  Service: Endoscopy;  Laterality: N/A;  . BILIARY STENT PLACEMENT N/A 04/12/2020   Procedure: BILIARY STENT PLACEMENT;  Surgeon: Jackquline Denmark, MD;  Location: [facility];  Service: Endoscopy;  Laterality: N/A;  . BIOPSY  12/03/2019   Procedure: BIOPSY;  Surgeon: Rush Landmark Telford Nab., MD;  Location: [facility];  Service: Gastroenterology;;  . ERCP N/A 09/05/2019   Procedure: ENDOSCOPIC RETROGRADE CHOLANGIOPANCREATOGRAPHY (ERCP);  Surgeon: Irene Shipper, MD;  Location: [facility];  Service: Endoscopy;  Laterality: N/A;  . ERCP N/A 04/12/2020   Procedure: ENDOSCOPIC RETROGRADE CHOLANGIOPANCREATOGRAPHY (ERCP);  Surgeon: Jackquline Denmark, MD;  Location: [facility];  Service: Endoscopy;  Laterality: N/A;  with stent exchange  . ESOPHAGOGASTRODUODENOSCOPY (EGD) WITH PROPOFOL N/A 12/03/2019   Procedure: ESOPHAGOGASTRODUODENOSCOPY (EGD) WITH PROPOFOL;  Surgeon: Rush Landmark Telford Nab., MD;  Location: [facility];  Service: Gastroenterology;  Laterality: N/A;  . EUS N/A 12/03/2019   Procedure: UPPER ENDOSCOPIC ULTRASOUND (EUS) LINEAR;  Surgeon: Irving Copas., MD;  Location: [facility];  Service: Gastroenterology;  Laterality: N/A;  . FINE NEEDLE ASPIRATION N/A 12/03/2019   Procedure: FINE NEEDLE ASPIRATION (FNA) LINEAR;  Surgeon: Irving Copas., MD;  [facility];  Service: Gastroenterology;  Laterality: N/A;  . IR CATHETER TUBE CHANGE  05/28/2020  . IR EXCHANGE BILIARY DRAIN  06/07/2020  . IR EXCHANGE BILIARY DRAIN  07/23/2020  . IR EXCHANGE BILIARY DRAIN  08/05/2020  . IR PERC CHOLECYSTOSTOMY  04/08/2020  . IR RADIOLOGIST EVAL & MGMT  04/29/2020  . SPHINCTEROTOMY  09/05/2019   Procedure: SPHINCTEROTOMY;  Surgeon: Irene Shipper, MD;  Location: [facility];  Service: Endoscopy;;  . Lavell Islam REMOVAL  04/12/2020   Procedure: STENT REMOVAL;  Surgeon: Jackquline Denmark, MD;  [facility]: [facility];  Service: Endoscopy;;     reports that she has never smoked. She has never used smokeless tobacco. She reports previous alcohol use. She reports previous drug use.  Allergies  Allergen Reactions  . Gadolinium Derivatives Other (See Comments)    Secondary to her stage IV kidney disease   . Amoxicillin Other (See Comments)    UNKNOWN REACTION     . Pollen Extract Other (See Comments)    Sneezing    Family History  Problem Relation Age of Onset  . Hypertension Other     Prior to Admission medications   Medication Sig Start Date End Date Taking? Authorizing Provider  capecitabine (XELODA) 500 MG tablet TAKE 2 TABLETS (1,000 MG TOTAL) BY MOUTH 2 (TWO) TIMES DAILY AFTER A MEAL. TAKE FOR 14 DAYS, THEN HOLD FOR 7 DAYS. REPEAT EVERY 21 DAYS. 08/13/20 08/13/21  [name], MD  cholecalciferol (VITAMIN D3) 25 MCG (1000 UNIT) tablet Take 1,000 Units by mouth 2 (two) times daily.    [provider]  levETIRAcetam (KEPPRA) 500 MG tablet Take 500 mg by mouth 2 (two) times daily. 05/13/19   [provider]  levothyroxine (SYNTHROID) 100 MCG tablet Take 100 mcg by mouth daily. 05/13/19   [provider]  Melatonin 10 MG CAPS Take 10 mg by mouth at bedtime.    [provider]  metoprolol tartrate (LOPRESSOR) 25 MG tablet Take 1 tablet (25 mg total) by mouth 2 (two) times daily. 04/06/20 05/06/20  Arrien, Jimmy Picket, MD  metoprolol tartrate (LOPRESSOR) 25 MG tablet Take 25 mg by mouth 2 (two) times daily.    [provider]  ondansetron (ZOFRAN) 4 MG tablet Take 1 tablet (4 mg total) by mouth every 8 (eight) hours as needed for nausea or vomiting. 09/06/20   [name], MD  sodium bicarbonate 650 MG tablet Take 650 mg by mouth daily. 05/27/19   [provider]  vitamin B-12 1000 MCG tablet Take 1 tablet (1,000 mcg total) by mouth daily. 09/06/19   Regalado, Cassie Freer, MD    Physical  Exam: Constitutional: Moderately built and nourished. Vitals:   09/22/20 1830 09/22/20 1903 09/22/20 2100 09/22/20 2330  BP: (!) 104/57  114/62 (!) 102/56  Pulse: 67 68 72 70  Resp: 20  20 19   Temp:   98.6 F (37 C) 97.8 F (36.6 C)  TempSrc:   Oral Oral  SpO2: 92% 94% 96% 100%  Weight:    44.1 kg  Height:       Eyes: Anicteric no pallor. ENMT: No discharge from the ears eyes nose or mouth. Neck: No mass felt.  No neck rigidity. Respiratory: No rhonchi or crepitations. Cardiovascular: S1-S2 heard. Abdomen: Soft nontender bowel sounds present.  Cholecystostomy tube and also suprapubic catheter seen. Musculoskeletal: No edema.  Pain on moving right lower extremity. Skin: No rash. Neurologic: Alert awake oriented to time place and person.  Moves all extremities. Psychiatric: Appears normal.  Normal affect.   Labs on Admission: I have personally reviewed following labs and imaging studies  CBC: Recent Labs  Lab 09/22/20 1723  WBC 13.5*  NEUTROABS 12.5*  HGB 8.5*  HCT 26.3*  MCV 103.1*  PLT 573   Basic Metabolic Panel: Recent Labs  Lab 09/22/20 1723  NA 132*  K 2.8*  CL 92*  CO2 23  GLUCOSE 100*  BUN 90*  CREATININE 5.30*  CALCIUM 7.4*   GFR: Estimated Creatinine Clearance: 5 mL/min (A) (by C-G formula based on SCr of 5.3 mg/dL (H)). Liver Function Tests: Recent Labs  Lab 09/22/20 1723  AST 27  ALT 22  ALKPHOS 121  BILITOT 0.7  PROT 6.6  ALBUMIN 2.4*   No results for input(s): LIPASE, AMYLASE in the last 168 hours. No results for input(s): AMMONIA in the last 168 hours. Coagulation Profile: No results for input(s): INR, PROTIME in the last 168 hours. Cardiac Enzymes: No results for input(s): CKTOTAL, CKMB, CKMBINDEX, TROPONINI in the last 168 hours. BNP (last 3 results) No results for input(s): PROBNP in the last 8760 hours. HbA1C: No results for input(s): HGBA1C in the last 72 hours. CBG: No results for input(s): GLUCAP in the last 168  hours. Lipid Profile: No results for input(s): CHOL, HDL, LDLCALC, TRIG, CHOLHDL, LDLDIRECT in the last 72 hours. Thyroid Function Tests: No results for input(s): TSH, T4TOTAL, FREET4, T3FREE, THYROIDAB in the last 72 hours. Anemia Panel: No results for input(s): VITAMINB12, FOLATE, FERRITIN, TIBC, IRON, RETICCTPCT in the last 72 hours. Urine analysis:    Component Value Date/Time   COLORURINE YELLOW 09/22/2020 1723   APPEARANCEUR CLOUDY (A) 09/22/2020 1723   LABSPEC 1.010 09/22/2020 1723   PHURINE 7.0 09/22/2020 1723   GLUCOSEU NEGATIVE 09/22/2020 1723   HGBUR LARGE (A) 09/22/2020 1723   BILIRUBINUR NEGATIVE 09/22/2020 1723   KETONESUR NEGATIVE 09/22/2020 1723   PROTEINUR 30 (A) 09/22/2020 1723   NITRITE POSITIVE (A) 09/22/2020 1723   LEUKOCYTESUR LARGE (A) 09/22/2020 1723   Sepsis Labs: @LABRCNTIP (procalcitonin:4,lacticidven:4) ) Recent Results (from the past 240 hour(s))  Resp Panel by RT-PCR (Flu A&B, Covid) Nasopharyngeal Swab     Status: None   Collection Time: 09/22/20  6:22 PM   Specimen: Nasopharyngeal Swab; Nasopharyngeal(NP) swabs in vial transport medium  Result Value Ref Range Status   SARS Coronavirus 2 by RT PCR NEGATIVE NEGATIVE Final    Comment: (NOTE) SARS-CoV-2 target nucleic acids are NOT DETECTED.  The SARS-CoV-2 RNA is generally detectable in upper respiratory specimens during the acute phase of infection. The lowest concentration of SARS-CoV-2 viral copies this assay can detect is 138 copies/mL. A negative result does not preclude SARS-Cov-2 infection and should not be used as the sole basis for treatment or other patient management decisions. A negative result may occur with  improper specimen collection/handling, submission of specimen other than nasopharyngeal swab, presence of viral mutation(s) within the areas targeted by this assay, and inadequate number of viral copies(<138 copies/mL). A negative result must be combined with clinical  observations, patient history, and epidemiological information. The expected result is Negative.  Fact Sheet for Patients:  EntrepreneurPulse.com.au  Fact Sheet for Healthcare Providers:  IncredibleEmployment.be  This test is no t yet approved or cleared by the [place] FDA and  has been authorized for detection and/or diagnosis of SARS-CoV-2 by FDA under an Emergency Use Authorization (EUA). This EUA will remain  in effect (meaning this test can be used) for the duration of the COVID-19 declaration under Section 564(b)(1) of the Act, 21 U.S.C.section 360bbb-3(b)(1), unless the authorization is terminated  or revoked sooner.       Influenza A by PCR NEGATIVE NEGATIVE Final   Influenza B by PCR NEGATIVE NEGATIVE Final    Comment: (NOTE) The Xpert Xpress SARS-CoV-2/FLU/RSV plus assay is intended  as an aid in the diagnosis of influenza from Nasopharyngeal swab specimens and should not be used as a sole basis for treatment. Nasal washings and aspirates are unacceptable for Xpert Xpress SARS-CoV-2/FLU/RSV testing.  Fact Sheet for Patients: EntrepreneurPulse.com.au  Fact Sheet for Healthcare Providers: IncredibleEmployment.be  This test is not yet approved or cleared by the [place] FDA and has been authorized for detection and/or diagnosis of SARS-CoV-2 by FDA under an Emergency Use Authorization (EUA). This EUA will remain in effect (meaning this test can be used) for the duration of the COVID-19 declaration under Section 564(b)(1) of the Act, 21 U.S.C. section 360bbb-3(b)(1), unless the authorization is terminated or revoked.  Performed at [facility], [address] [place], [place] [address]      Radiological Exams on Admission: CT ABDOMEN PELVIS WO CONTRAST  Result Date: 09/22/2020 CLINICAL DATA:  Nausea and vomiting, flank pain, suprapubic catheter not draining, history of  pancreatic cancer EXAM: CT ABDOMEN AND PELVIS WITHOUT CONTRAST TECHNIQUE: Multidetector CT imaging of the abdomen and pelvis was performed following the standard protocol without IV contrast. COMPARISON:  07/24/2020, 09/09/2020 FINDINGS: Lower chest: Pectus excavatum deformity again noted. No acute pleural or parenchymal lung disease. Unenhanced CT was performed per clinician order. Lack of IV contrast limits sensitivity and specificity, especially for evaluation of abdominal/pelvic solid viscera. Hepatobiliary: Unenhanced imaging of the liver demonstrates diffuse pneumobilia consistent with patency of the indwelling metallic common bile duct stent. Pigtail drainage catheter is seen in the region of the gallbladder fundus. Mild pericholecystic fluid and gallbladder wall thickening. Pancreas: Evaluation of the pancreas is limited without contrast. Cystic areas in the pancreatic tail are unchanged. Ill-defined soft tissue prominence in the pancreatic head, traversed by the metallic stent, consistent with known pancreatic cancer. Stable pancreatic duct dilation. Spleen: Normal in size without focal abnormality. Adrenals/Urinary Tract: Marked bilateral renal cortical atrophy. Diverting urostomy right lower quadrant with indwelling Foley catheter. Mild distension of the bilateral renal collecting systems and ureters, with diffuse uroepithelial thickening, unchanged since prior exam. Gas and fluid are seen within the neobladder. Stomach/Bowel: No bowel obstruction or ileus. No bowel wall thickening or inflammatory change. Diverticulosis of the sigmoid colon without diverticulitis. Vascular/Lymphatic: Stable atherosclerosis of the aorta. No gross lymphadenopathy on this unenhanced exam. Reproductive: Status post hysterectomy. No adnexal masses. Other: No free fluid or free intraperitoneal gas. No abdominal wall hernia. Musculoskeletal: No acute or destructive bony lesions. The patient is cachectic. Reconstructed images  demonstrate no additional findings. IMPRESSION: 1. Stable ill-defined mass in the pancreatic head, traversed by a metallic biliary stent, consistent with known pancreatic cancer. 2. Pneumobilia consistent with stent patency. 3. Stable percutaneous cholecystostomy, with persistent wall thickening of the gallbladder and trace pericholecystic fluid. 4. Postsurgical changes from cystectomy and urinary diversion. Chronic mild bilateral hydronephrosis and hydroureter with uroepithelial thickening unchanged. There is moderate gas and fluid within the neobladder, with indwelling Foley catheter. 5.  Aortic Atherosclerosis (ICD10-I70.0). Electronically Signed   By: Randa Ngo M.D.   On: 09/22/2020 18:31     Assessment/Plan Principal Problem:   Acute kidney failure ([place]) Active Problems:   Primary pancreatic cancer ([place])   Hypokalemia   ARF (acute renal failure) ([place])    1. Acute on chronic kidney disease stage III likely from obstructive suprapubic catheter which at this time is draining.  We will gently hydrate follow metabolic panel closely.  Patient has known history of urinary diversion. 2. Hypokalemia cause not clear.  Replace and recheck.  Check  magnesium levels with next blood draw. 3. Possible UTI on antibiotics for urine cultures. 4. History of seizures on Keppra for which I discussed with pharmacist to adjust the dose according to renal function. 5. Hypothyroid on Synthroid. 6. Proximal atrial fibrillation on metoprolol.  Presently rate controlled.  Not on anticoagulation because of risk of bleeding as per the oncologist. 7. Pancreatic cancer being followed by Dr. Marin Olp.  Patient stopped taking Xeloda was patient was not able to tolerate. 8. Right lower extremity pain after recent fall CT scan of the abdomen pelvis done did not show any acute fractures.  We will continue to monitor.  Since patient has significantly worsening renal function and also has other metabolic abnormalities will  need close monitoring and dose adjustment of medications will need inpatient status.   DVT prophylaxis: Heparin. Code Status: DNR. Family Communication: Discussed with patient. Disposition Plan: Back to facility when stable. Consults called: None. Admission status: Inpatient.   Rise Patience MD [facility] Pager [phone].  If 7PM-7AM, please contact night-coverage [email or web address] Password [facility]  09/23/2020, 12:17 AM

## 2020-09-23 NOTE — Progress Notes (Signed)
Pt chronic foley flushed x2 with a total of 50 ml of sterile fluid. 467ml of urine flowed out. Urine appearance is  amber, and cloudy w/sediment. Charted a total of 375(425 output minus 50 input equals 375). Reported off to [facility].

## 2020-09-24 LAB — BASIC METABOLIC PANEL
Anion gap: 7 (ref 5–15)
BUN: 73 mg/dL — ABNORMAL HIGH (ref 8–23)
CO2: 19 mmol/L — ABNORMAL LOW (ref 22–32)
Calcium: 6.7 mg/dL — ABNORMAL LOW (ref 8.9–10.3)
Chloride: 108 mmol/L (ref 98–111)
Creatinine, Ser: 4.14 mg/dL — ABNORMAL HIGH (ref 0.44–1.00)
GFR, Estimated: 10 mL/min — ABNORMAL LOW (ref >60)
Glucose, Bld: 81 mg/dL (ref 70–99)
Potassium: 3.1 mmol/L — ABNORMAL LOW (ref 3.5–5.1)
Sodium: 134 mmol/L — ABNORMAL LOW (ref 135–145)

## 2020-09-24 LAB — CBC
HCT: 21.4 % — ABNORMAL LOW (ref 36.0–46.0)
Hemoglobin: 6.7 g/dL — CL (ref 12.0–15.0)
MCH: 33.2 pg (ref 26.0–34.0)
MCHC: 31.3 g/dL (ref 30.0–36.0)
MCV: 105.9 fL — ABNORMAL HIGH (ref 80.0–100.0)
Platelets: 159 10*3/uL (ref 150–400)
RBC: 2.02 MIL/uL — ABNORMAL LOW (ref 3.87–5.11)
RDW: 14.9 % (ref 11.5–15.5)
WBC: 6.9 10*3/uL (ref 4.0–10.5)
nRBC: 0 % (ref 0.0–0.2)

## 2020-09-24 LAB — HEMOGLOBIN AND HEMATOCRIT, BLOOD
HCT: 28.2 % — ABNORMAL LOW (ref 36.0–46.0)
Hemoglobin: 9 g/dL — ABNORMAL LOW (ref 12.0–15.0)

## 2020-09-24 LAB — POTASSIUM: Potassium: 3.3 mmol/L — ABNORMAL LOW (ref 3.5–5.1)

## 2020-09-24 MED ORDER — SODIUM CHLORIDE 0.9% IV SOLUTION: Freq: Once | INTRAVENOUS | Status: AC

## 2020-09-24 MED ORDER — SODIUM CHLORIDE 0.9% IV SOLUTION: Freq: Once | INTRAVENOUS | Status: DC

## 2020-09-24 MED ORDER — POTASSIUM CHLORIDE CRYS ER 20 MEQ PO TBCR
40.0000 meq | EXTENDED_RELEASE_TABLET | Freq: Once | ORAL | Status: DC
  Filled 2020-09-24: qty 2

## 2020-09-24 MED ORDER — POTASSIUM CHLORIDE CRYS ER 20 MEQ PO TBCR
40.0000 meq | EXTENDED_RELEASE_TABLET | Freq: Once | ORAL | Status: AC
  Administered 2020-09-24: 40 meq via ORAL
  Filled 2020-09-24: qty 2

## 2020-09-24 NOTE — Progress Notes (Signed)
Received critical lab value (hgb 6.7) at 0233 from [place]. Provider, Ninetta Lights, paged, waiting for orders.

## 2020-09-24 NOTE — Progress Notes (Signed)
Pt refused to take keppra this evening. Pt said "I do not take that anymore." This RN told pt she had been taking the med since she got here. Pt responded "I know, but I do not take it anymore."

## 2020-09-24 NOTE — Progress Notes (Signed)
Pt refused morning medications and scheduled potassium. Pt said "no more, I want to be left alone." This RN educated patient about the importance of taking the medications and the risks of not taking them. Pt still refused to take meds. Provider, [place], notified.

## 2020-09-24 NOTE — Progress Notes (Signed)
Overnight floor coverage progress note  Hemoglobin continues to downtrend, 6.7 this morning without obvious source of bleeding.  Baseline hemoglobin appears to be in the 9-10 range. -Benefits versus risks discussed and patient has given verbal consent for blood transfusion.  Type and screen, 1 unit PRBCs ordered.  Follow-up posttransfusion H&H.

## 2020-09-24 NOTE — Evaluation (Signed)
Physical Therapy Evaluation Patient Details Name: Glenda King MRN: [id number] DOB: 11/24/1928 Today's Date: 09/24/2020   History of Present Illness  The pt is an 87 yo female presenting from [place] due to suprapubic catheter not draining. Upon workup, pt found to have elevated creatinine and hypotension. Pt now s/p 1 unit PRBC. PMH includes: recent admission for AKI, CKD, pancreatic cancer on home hospice, and anemia.    Clinical Impression  Pt in bed upon arrival of PT, agreeable to evaluation at this time. Prior to admission the pt was living at [facility] where she received assist of 2 for mobility and transfers, and assist from staff for ADLs. The pt now presents with limitations in functional mobility, strength, stability, and activity tolerance due to above dx and chronically reduced mobility since last admission, and will continue to benefit from skilled PT to address these deficits. The pt was able to come to sitting EOB with minA, and required modA of 1 to complete sit-stand and lateral steps to transfer to recliner. The pt requires modA to steady with standing, and assist to facilitate wt shift for steps at this time, benefits from BUE support but declined use of RW this session. The pt will benefit from skilled PT to improve safety with use of DME and improved activity tolerance to align with pt goal of being able to complete transfers independently.      Follow Up Recommendations Other (comment) (return to [name], HHPT if available)    Equipment Recommendations  None recommended by PT    Recommendations for Other Services       Precautions / Restrictions Precautions Precautions: Fall Precaution Comments: L abdominal drain, suprapubic catheter Restrictions Weight Bearing Restrictions: No      Mobility  Bed Mobility Overal bed mobility: Needs Assistance Bed Mobility: Supine to Sit     Supine to sit: Min assist     General bed mobility comments:  minA to raiase trunk from elevated HOB    Transfers Overall transfer level: Needs assistance Equipment used: 1 person hand held assist Transfers: Sit to/from [facility] Sit to Stand: Mod assist Stand pivot transfers: Mod assist       General transfer comment: modA with PT standing in front of pt to power up from sitting EOB. Pt holding PT forearms to steady. posterior lean initially, improved with static stance  Ambulation/Gait Ambulation/Gait assistance: Mod assist Gait Distance (Feet): 4 Feet Assistive device: 1 person hand held assist Gait Pattern/deviations: Step-to pattern;Decreased stride length Gait velocity: decreased Gait velocity interpretation: <1.31 ft/sec, indicative of household ambulator General Gait Details: pt with small, shuffling steps to R with BUE support on PT forearms and PT assisting with wt shift and to steady through gait belt.         Balance Overall balance assessment: Needs assistance Sitting-balance support: No upper extremity supported Sitting balance-Leahy Scale: Fair   Postural control: Posterior lean Standing balance support: Bilateral upper extremity supported Standing balance-Leahy Scale: Poor Standing balance comment: reliant on BUE support                             Pertinent Vitals/Pain Pain Assessment: No/denies pain    Home Living Family/patient expects to be discharged to:: Assisted living                 Additional Comments: pt from [facility], states she has been in assisted living since last admission with staff providing  all needs    Prior Function Level of Independence: Needs assistance   Gait / Transfers Assistance Needed: pt reports assist of +2 to complete stand-pivot or scoot transfers  ADL's / Homemaking Assistance Needed: performed by staff. pt reports independent until last hospital admission  Comments: reports she has needed assist since last admission but previously had been  independent without AD, living alone     Hand Dominance   Dominant Hand: Right    Extremity/Trunk Assessment   Upper Extremity Assessment Upper Extremity Assessment: Generalized weakness    Lower Extremity Assessment Lower Extremity Assessment: Generalized weakness    Cervical / Trunk Assessment Cervical / Trunk Assessment: Kyphotic (multiple drains)  Communication   Communication: No difficulties  Cognition Arousal/Alertness: Awake/alert Behavior During Therapy: Flat affect;WFL for tasks assessed/performed Overall Cognitive Status: No family/caregiver present to determine baseline cognitive functioning Area of Impairment: Orientation;Memory;Safety/judgement;Awareness;Problem solving                 Orientation Level: Disoriented to;Situation (pt stating she has been at hospital ever since her fall, but per chart pt fell ~1 week prior to [place] and presented to hospital for clogged catheter.)   Memory: Decreased short-term memory   Safety/Judgement: Decreased awareness of safety   Problem Solving: Requires verbal cues;Difficulty sequencing General Comments: pt initially directing therapist well, clearly stating level of assist given. However, once in chair pt becoming increasingly restless, frequently directing therapist to move and reposition objects with little regard for safety. Pt asking to leave chair unlocked, and repeatedly attempting to pull recliner lever to raise legs despite asking PT to put legs of recliner down.      General Comments General comments (skin integrity, edema, etc.): VSS on RA    Exercises     Assessment/Plan    PT Assessment Patient needs continued PT services  PT Problem List Decreased strength;Decreased range of motion;Decreased balance;Decreased activity tolerance;Decreased mobility       PT Treatment Interventions DME instruction;Gait training;Functional mobility training;Therapeutic activities;Therapeutic exercise;Balance  training;Patient/family education    PT Goals (Current goals can be found in the Care Plan section)  Acute Rehab PT Goals Patient Stated Goal: improve ability to move on her own PT Goal Formulation: With patient Time For Goal Achievement: 10/08/20 Potential to Achieve Goals: Good    Frequency Min 2X/week    AM-PAC PT "6 Clicks" Mobility  Outcome Measure Help needed turning from your back to your side while in a flat bed without using bedrails?: A Little Help needed moving from lying on your back to sitting on the side of a flat bed without using bedrails?: A Little Help needed moving to and from a bed to a chair (including a wheelchair)?: A Lot Help needed standing up from a chair using your arms (e.g., wheelchair or bedside chair)?: A Lot Help needed to walk in hospital room?: A Lot Help needed climbing 3-5 steps with a railing? : A Lot 6 Click Score: 14    End of Session Equipment Utilized During Treatment: Gait belt Activity Tolerance: Patient tolerated treatment well Patient left: in chair;with call bell/phone within reach;with chair alarm set (chair unlocked to appease pt) Nurse Communication: Mobility status PT Visit Diagnosis: Unsteadiness on feet (R26.81);Muscle weakness (generalized) (M62.81);History of falling (Z91.81)    Time: 7035-0093 PT Time Calculation (min) (ACUTE ONLY): 32 min   Charges:   PT Evaluation $PT Eval Low Complexity: 1 Low PT Treatments $Gait Training: 8-22 mins        Chrisha Vogel F, PT,  DPT   Acute Rehabilitation Department Pager #: [phone]  Otho Bellows 09/24/2020, 5:38 PM

## 2020-09-24 NOTE — Plan of Care (Signed)

## 2020-09-24 NOTE — Progress Notes (Signed)
PROGRESS NOTE    Glenda King  JFH:[id number] DOB: [birth year] DOA: 09/22/2020 PCP: Javier Glazier, MD   Chief Complaint  Patient presents with  . Urinary Retention  Brief Narrative: 87 year old female with pancreatic cancer followed by Dr. Marin Olp, PAF not on anticoagulations due to risk of bleeding, chronic kidney disease history of bladder cancer status post diversion doing self cath from abdominal stoma brought to the ED due to clogged suprapubic catheter.  Patient also had a recent fall a week ago since then having difficulty ambulating with some hip pain  In the ED suprapubic catheter was related and started draining urine labs showed acute renal failure hyperkalemia leukocytosis UA with positive for nitrates bacteria and WBC, started IV with hydration antibiotic and admitted.  CT abdomen pelvis was done  Subjective: Seen and examined no new complaints. Refusing potassium.1 unit prbc was ordered this am for low hb  Assessment & Plan:  AKI on CKD IV Obstructive nephropathy: baseline creat  1.7 (08/30/20). Due to obstructive nephropathy.  Having a difficulty draining of the bladder pouch.  CT abdomen 4/14 showed marked distention of the catheterizable pouch, seen by Dr. Milford Cage 16 French Foley catheter inserted 1000 cc drained. Monitor uop, cont ivf, creatinine is downtrending. Recent Labs  Lab 09/22/20 1723 09/23/20 0233 09/23/20 1533 09/24/20 0143  BUN 90* 82* 76* 73*  CREATININE 5.30* 4.87* 4.43* 4.14*    Intake/Output Summary (Last 24 hours) at 09/24/2020 1113 Last data filed at 09/24/2020 0851 Gross per 24 hour  Intake 470 ml  Output 1035 ml  Net -565 ml   Remote history of bladder cancer status post radical cystectomy and urinary diversion with continent catheterizable pouch-does self cath at home.Urology following.  Leukocytosis resolved Recent Labs  Lab 09/22/20 1723 09/23/20 0233 09/24/20 0143  WBC 13.5* 9.5 6.9   Hypokalemia: Being repleted  this a.m.  Possible UTI-continue ceftriaxone, urine cx pending  Anemia, ?cause- hemoglobin downtrending-6.7 g 1 unit PRBC ordered likely in the setting of pancreatic malignancy.No obvious bleeding otherwise.  Baseline hemoglobin appears to be around 9 to 10 g back in March and February.  Elevated ferritin 937/DSKAJGOT folic acid and L57. Check FOBT. Recent Labs  Lab 09/22/20 1723 09/23/20 0233 09/24/20 0143  HGB 8.5* 7.7* 6.7*  HCT 26.3* 24.2* 21.4*   History of seizure continue Keppra  Hypothyroidism continue Synthroid  PAF on metoprolol-but hold due to soft blood pressure. not on anticoagulation due to risk of bleeding  Pancreatic cancer patient is followed by w/ Dr. Marin Olp; able to hold chemo only for 2 weeks and has stopped since.  CT abdomen shows stable, ill-defined pancreatic head mass, stable CBD stent, has percutaneous drain is in place  Right lower extremity pain from recent fall CT abdomen pelvis showed no acute fracture. Hypotension: We will give for 565mL bolus x1 monitor  GOC: DNR requested.  Palliative care consult requested, overall prognosis is guarded.  Diet Order            Diet regular Room service appropriate? Yes with Assist; Fluid consistency: Thin  Diet effective now                 Nutrition Problem: Severe Malnutrition Etiology: chronic illness (cancer) Signs/Symptoms: severe fat depletion,severe muscle depletion,energy intake < or equal to 75% for > or equal to 1 month Interventions: MVI,Ensure Enlive (each supplement provides 350kcal and 20 grams of protein),Liberalize Diet,Magic cup Patient's Body mass index is 15.23 kg/m.  DVT prophylaxis: heparin injection 5,000 Units Start:  09/23/20 0600 Code Status:   Code Status: DNR Family Communication: plan of care discussed with patient at bedside.  Status is: Inpatient Remains inpatient appropriate because:Inpatient level of care appropriate due to severity of illness  Dispo: The patient is from:  Home              Anticipated d/c is to: Home.  Obtain PT OT              Patient currently is not medically stable to d/c.   Difficult to place patient No  Unresulted Labs (From admission, onward)          Start     Ordered   09/24/20 0092  Basic metabolic panel  Daily,   R     Question:  Specimen collection method  Answer:  Lab=Lab collect   09/23/20 0737   09/24/20 0500  CBC  Daily,   R     Question:  Specimen collection method  Answer:  Lab=Lab collect   09/23/20 0737   09/24/20 0251  Prepare RBC (crossmatch)  (Adult Blood Administration - Red Blood Cells)  Once,   R       Question Answer Comment  # of Units 1 unit   Transfusion Indications Symptomatic Anemia   Number of Units to Keep Ahead NO units ahead   If emergent release call blood bank Not emergent release      09/24/20 0250   09/22/20 1705  Urine Culture  ONCE - STAT,   STAT        09/22/20 1704          Medications reviewed: Scheduled Meds: . sodium chloride   Intravenous Once  . sodium chloride   Intravenous Once  . feeding supplement  237 mL Oral TID BM  . heparin  5,000 Units Subcutaneous Q8H  . levETIRAcetam  250 mg Oral BID  . levothyroxine  100 mcg Oral QAC breakfast  . melatonin  10 mg Oral QHS  . metoprolol tartrate  25 mg Oral BID  . multivitamin with minerals  1 tablet Oral Daily  . potassium chloride  40 mEq Oral Once  . cyanocobalamin  1,000 mcg Oral Daily   Continuous Infusions: . sodium chloride Stopped (09/23/20 1108)  . cefTRIAXone (ROCEPHIN)  IV 1 g (09/23/20 1646)    Consultants:see note  Procedures:see note  Antimicrobials: Anti-infectives (From admission, onward)   Start     Dose/Rate Route Frequency Ordered Stop   09/23/20 1700  cefTRIAXone (ROCEPHIN) 1 g in sodium chloride 0.9 % 100 mL IVPB        1 g 200 mL/hr over 30 Minutes Intravenous Every 24 hours 09/23/20 0016     09/22/20 1715  cefTRIAXone (ROCEPHIN) 1 g in sodium chloride 0.9 % 100 mL IVPB        1 g 200 mL/hr over  30 Minutes Intravenous  Once 09/22/20 1706 09/22/20 1812     Culture/Microbiology    Component Value Date/Time   SDES  04/08/2020 1700    BILE ABSCESS Performed at [facility], [place] [address], [place], [place] [address]    [place]  04/08/2020 1700    NONE Performed at [facility], [place] [address], [place], [place] [address]    CULT (A) 04/08/2020 1700    MULTIPLE ORGANISMS PRESENT, NONE PREDOMINANT NO ANAEROBES ISOLATED Performed at [facility], [place] [address], [place], [place] [address]    REPTSTATUS 04/13/2020 FINAL 04/08/2020 1700  Other culture-see note  Objective: Vitals: Today's Vitals   09/23/20 2139 09/23/20 2158 09/23/20 2322 09/24/20 0011  BP: (!) 87/45 (!) 97/46 (!) 87/45 (!) 96/51  Pulse:      Resp:      Temp:      TempSrc:      SpO2:      Weight:      Height:      PainSc:   0-No pain     Intake/Output Summary (Last 24 hours) at 09/24/2020 0728 Last data filed at 09/24/2020 0700 Gross per 24 hour  Intake 470 ml  Output 800 ml  Net -330 ml   Filed Weights   09/22/20 1644 09/22/20 2330  Weight: 44.5 kg 44.1 kg   Weight change:   Intake/Output from previous day: 04/14 0701 - 04/15 0700 In: 470 [P.O.:120; IV Piggyback:350] Out: 1175 [Urine:1150; Drains:25] Intake/Output this shift: No intake/output data recorded. Filed Weights   09/22/20 1644 09/22/20 2330  Weight: 44.5 kg 44.1 kg    Examination: General exam: AAOx3, thin cachectic frail,NAD, weak appearing. HEENT:Oral mucosa moist, Ear/Nose WNL grossly,dentition normal. Respiratory system: bilaterally diminished,no use of accessory muscle, non tender. Cardiovascular system: S1 & S2 +, regular, No JVD. Gastrointestinal system: Abdomen soft, NT,ND, BS+.  Foley catheter in place, percutaneous drains from the right side of abdomen is in place with drain+ Nervous System:Alert, awake, moving extremities and grossly nonfocal Extremities: No edema,  distal peripheral pulses palpable.  Skin: No rashes,no icterus. MSK: Normal muscle bulk,tone, power  Data Reviewed: I have personally reviewed following labs and imaging studies CBC: Recent Labs  Lab 09/22/20 1723 09/23/20 0233 09/24/20 0143  WBC 13.5* 9.5 6.9  NEUTROABS 12.5*  --   --   HGB 8.5* 7.7* 6.7*  HCT 26.3* 24.2* 21.4*  MCV 103.1* 104.8* 105.9*  PLT 235 188 009   Basic Metabolic Panel: Recent Labs  Lab 09/22/20 1723 09/23/20 0233 09/23/20 1533 09/24/20 0143  NA 132* 135 135 134*  K 2.8* 4.0 3.6 3.1*  CL 92* 101 104 108  CO2 23 19* 18* 19*  GLUCOSE 100* 81 86 81  BUN 90* 82* 76* 73*  CREATININE 5.30* 4.87* 4.43* 4.14*  CALCIUM 7.4* 7.0* 6.6* 6.7*  MG  --  1.9  --   --    GFR: Estimated Creatinine Clearance: 6.4 mL/min (A) (by C-G formula based on SCr of 4.14 mg/dL (H)). Liver Function Tests: Recent Labs  Lab 09/22/20 1723  AST 27  ALT 22  ALKPHOS 121  BILITOT 0.7  PROT 6.6  ALBUMIN 2.4*   No results for input(s): LIPASE, AMYLASE in the last 168 hours. No results for input(s): AMMONIA in the last 168 hours. Coagulation Profile: No results for input(s): INR, PROTIME in the last 168 hours. Cardiac Enzymes: No results for input(s): CKTOTAL, CKMB, CKMBINDEX, TROPONINI in the last 168 hours. BNP (last 3 results) No results for input(s): PROBNP in the last 8760 hours. HbA1C: No results for input(s): HGBA1C in the last 72 hours. CBG: No results for input(s): GLUCAP in the last 168 hours. Lipid Profile: No results for input(s): CHOL, HDL, LDLCALC, TRIG, CHOLHDL, LDLDIRECT in the last 72 hours. Thyroid Function Tests: No results for input(s): TSH, T4TOTAL, FREET4, T3FREE, THYROIDAB in the last 72 hours. Anemia Panel: Recent Labs    09/23/20 0753  VITAMINB12 3,242*  FOLATE 15.2  FERRITIN 584*  TIBC 160*  IRON 32  RETICCTPCT 1.6   Sepsis Labs: No results for input(s): PROCALCITON, LATICACIDVEN in the last  168 hours.  Recent Results (from the  past 240 hour(s))  Resp Panel by RT-PCR (Flu A&B, Covid) Nasopharyngeal Swab     Status: None   Collection Time: 09/22/20  6:22 PM   Specimen: Nasopharyngeal Swab; Nasopharyngeal(NP) swabs in vial transport medium  Result Value Ref Range Status   SARS Coronavirus 2 by RT PCR NEGATIVE NEGATIVE Final    Comment: (NOTE) SARS-CoV-2 target nucleic acids are NOT DETECTED.  The SARS-CoV-2 RNA is generally detectable in upper respiratory specimens during the acute phase of infection. The lowest concentration of SARS-CoV-2 viral copies this assay can detect is 138 copies/mL. A negative result does not preclude SARS-Cov-2 infection and should not be used as the sole basis for treatment or other patient management decisions. A negative result may occur with  improper specimen collection/handling, submission of specimen other than nasopharyngeal swab, presence of viral mutation(s) within the areas targeted by this assay, and inadequate number of viral copies(<138 copies/mL). A negative result must be combined with clinical observations, patient history, and epidemiological information. The expected result is Negative.  Fact Sheet for Patients:  EntrepreneurPulse.com.au  Fact Sheet for Healthcare Providers:  IncredibleEmployment.be  This test is no t yet approved or cleared by the [place] FDA and  has been authorized for detection and/or diagnosis of SARS-CoV-2 by FDA under an Emergency Use Authorization (EUA). This EUA will remain  in effect (meaning this test can be used) for the duration of the COVID-19 declaration under Section 564(b)(1) of the Act, 21 U.S.C.section 360bbb-3(b)(1), unless the authorization is terminated  or revoked sooner.       Influenza A by PCR NEGATIVE NEGATIVE Final   Influenza B by PCR NEGATIVE NEGATIVE Final    Comment: (NOTE) The Xpert Xpress SARS-CoV-2/FLU/RSV plus assay is intended as an aid in the diagnosis of  influenza from Nasopharyngeal swab specimens and should not be used as a sole basis for treatment. Nasal washings and aspirates are unacceptable for Xpert Xpress SARS-CoV-2/FLU/RSV testing.  Fact Sheet for Patients: EntrepreneurPulse.com.au  Fact Sheet for Healthcare Providers: IncredibleEmployment.be  This test is not yet approved or cleared by the [place] FDA and has been authorized for detection and/or diagnosis of SARS-CoV-2 by FDA under an Emergency Use Authorization (EUA). This EUA will remain in effect (meaning this test can be used) for the duration of the COVID-19 declaration under Section 564(b)(1) of the Act, 21 U.S.C. section 360bbb-3(b)(1), unless the authorization is terminated or revoked.  Performed at [facility], [place] [place], [place] [address]      Radiology Studies: CT ABDOMEN PELVIS WO CONTRAST  Result Date: 09/23/2020 CLINICAL DATA:  Abdominal distension, catheter within neobladder not draining EXAM: CT ABDOMEN AND PELVIS WITHOUT CONTRAST TECHNIQUE: Multidetector CT imaging of the abdomen and pelvis was performed following the standard protocol without IV contrast. COMPARISON:  09/22/2020 FINDINGS: Lower chest: No acute pleural or parenchymal lung disease. Dependent lower lobe atelectasis. Hepatobiliary: Stable pneumobilia consistent with patent metallic biliary stent. Remainder of the liver is unremarkable. Percutaneous cholecystostomy tube again noted unchanged. Pericholecystic fluid and gallbladder wall thickening again noted unchanged. The metallic common bile duct stent is unchanged. Pancreas: Amorphous soft tissue mass within the pancreatic head consistent with known neoplasm, unchanged. Cystic areas within the pancreatic tail are stable. Stable pancreatic duct dilation. Spleen: Normal in size without focal abnormality. Adrenals/Urinary Tract: The Foley catheter within the neobladder has been  removed. There is increased distension, with worsening right-sided hydronephrosis and hydroureter. Bilateral renal  atrophy unchanged. Stomach/Bowel: No bowel obstruction or ileus. No bowel wall thickening. Vascular/Lymphatic: Extensive atherosclerosis unchanged. No pathologic adenopathy. Reproductive: Status post hysterectomy. No adnexal masses. Other: Trace free fluid within the paracolic gutters. No free intraperitoneal gas. Musculoskeletal: No acute or destructive bony lesions. Reconstructed images demonstrate no additional findings. IMPRESSION: 1. Interval removal of the Foley catheter within the neobladder, with increased neo bladder distension. This results in worsening right-sided hydronephrosis and hydroureter. 2. Stable common bile duct stent, with pneumobilia confirming stent patency. 3. Stable ill-defined pancreatic head mass consistent with known pancreatic cancer. 4. Stable percutaneous cholecystostomy. 5.  Aortic Atherosclerosis (ICD10-I70.0). These results were called by telephone at the time of interpretation on 09/23/2020 at 7:42 pm to provider DR Milford Cage, who verbally acknowledged these results. Electronically Signed   By: Randa Ngo M.D.   On: 09/23/2020 19:47   CT ABDOMEN PELVIS WO CONTRAST  Result Date: 09/22/2020 CLINICAL DATA:  Nausea and vomiting, flank pain, suprapubic catheter not draining, history of pancreatic cancer EXAM: CT ABDOMEN AND PELVIS WITHOUT CONTRAST TECHNIQUE: Multidetector CT imaging of the abdomen and pelvis was performed following the standard protocol without IV contrast. COMPARISON:  07/24/2020, 09/09/2020 FINDINGS: Lower chest: Pectus excavatum deformity again noted. No acute pleural or parenchymal lung disease. Unenhanced CT was performed per clinician order. Lack of IV contrast limits sensitivity and specificity, especially for evaluation of abdominal/pelvic solid viscera. Hepatobiliary: Unenhanced imaging of the liver demonstrates diffuse pneumobilia consistent  with patency of the indwelling metallic common bile duct stent. Pigtail drainage catheter is seen in the region of the gallbladder fundus. Mild pericholecystic fluid and gallbladder wall thickening. Pancreas: Evaluation of the pancreas is limited without contrast. Cystic areas in the pancreatic tail are unchanged. Ill-defined soft tissue prominence in the pancreatic head, traversed by the metallic stent, consistent with known pancreatic cancer. Stable pancreatic duct dilation. Spleen: Normal in size without focal abnormality. Adrenals/Urinary Tract: Marked bilateral renal cortical atrophy. Diverting urostomy right lower quadrant with indwelling Foley catheter. Mild distension of the bilateral renal collecting systems and ureters, with diffuse uroepithelial thickening, unchanged since prior exam. Gas and fluid are seen within the neobladder. Stomach/Bowel: No bowel obstruction or ileus. No bowel wall thickening or inflammatory change. Diverticulosis of the sigmoid colon without diverticulitis. Vascular/Lymphatic: Stable atherosclerosis of the aorta. No gross lymphadenopathy on this unenhanced exam. Reproductive: Status post hysterectomy. No adnexal masses. Other: No free fluid or free intraperitoneal gas. No abdominal wall hernia. Musculoskeletal: No acute or destructive bony lesions. The patient is cachectic. Reconstructed images demonstrate no additional findings. IMPRESSION: 1. Stable ill-defined mass in the pancreatic head, traversed by a metallic biliary stent, consistent with known pancreatic cancer. 2. Pneumobilia consistent with stent patency. 3. Stable percutaneous cholecystostomy, with persistent wall thickening of the gallbladder and trace pericholecystic fluid. 4. Postsurgical changes from cystectomy and urinary diversion. Chronic mild bilateral hydronephrosis and hydroureter with uroepithelial thickening unchanged. There is moderate gas and fluid within the neobladder, with indwelling Foley catheter. 5.   Aortic Atherosclerosis (ICD10-I70.0). Electronically Signed   By: Randa Ngo M.D.   On: 09/22/2020 18:31     LOS: 2 days   Antonieta Pert, MD [facility]  09/24/2020, 7:28 AM

## 2020-09-25 DIAGNOSIS — Z7189 Other specified counseling: Secondary | ICD-10-CM

## 2020-09-25 DIAGNOSIS — R339 Retention of urine, unspecified: Secondary | ICD-10-CM

## 2020-09-25 DIAGNOSIS — C259 Malignant neoplasm of pancreas, unspecified: Secondary | ICD-10-CM

## 2020-09-25 DIAGNOSIS — Z515 Encounter for palliative care: Secondary | ICD-10-CM

## 2020-09-25 LAB — CBC
HCT: 27.1 % — ABNORMAL LOW (ref 36.0–46.0)
Hemoglobin: 8.8 g/dL — ABNORMAL LOW (ref 12.0–15.0)
MCH: 32.8 pg (ref 26.0–34.0)
MCHC: 32.5 g/dL (ref 30.0–36.0)
MCV: 101.1 fL — ABNORMAL HIGH (ref 80.0–100.0)
Platelets: 166 10*3/uL (ref 150–400)
RBC: 2.68 MIL/uL — ABNORMAL LOW (ref 3.87–5.11)
RDW: 17.6 % — ABNORMAL HIGH (ref 11.5–15.5)
WBC: 5.6 10*3/uL (ref 4.0–10.5)
nRBC: 0 % (ref 0.0–0.2)

## 2020-09-25 LAB — BASIC METABOLIC PANEL
Anion gap: 8 (ref 5–15)
BUN: 67 mg/dL — ABNORMAL HIGH (ref 8–23)
CO2: 17 mmol/L — ABNORMAL LOW (ref 22–32)
Calcium: 7.2 mg/dL — ABNORMAL LOW (ref 8.9–10.3)
Chloride: 111 mmol/L (ref 98–111)
Creatinine, Ser: 3.74 mg/dL — ABNORMAL HIGH (ref 0.44–1.00)
GFR, Estimated: 11 mL/min — ABNORMAL LOW (ref >60)
Glucose, Bld: 90 mg/dL (ref 70–99)
Potassium: 3.3 mmol/L — ABNORMAL LOW (ref 3.5–5.1)
Sodium: 136 mmol/L (ref 135–145)

## 2020-09-25 MED ORDER — POTASSIUM CHLORIDE CRYS ER 20 MEQ PO TBCR
40.0000 meq | EXTENDED_RELEASE_TABLET | Freq: Once | ORAL | Status: AC
  Administered 2020-09-25: 40 meq via ORAL
  Filled 2020-09-25: qty 2

## 2020-09-25 MED ORDER — HYDROMORPHONE HCL 1 MG/ML PO LIQD
0.5000 mg | ORAL | Status: DC
  Administered 2020-09-25 (×2): 0.5 mg via ORAL
  Filled 2020-09-25 (×2): qty 1

## 2020-09-25 MED ORDER — CEPHALEXIN 500 MG PO CAPS: 500.0000 mg | ORAL_CAPSULE | Freq: Four times a day (QID) | ORAL | 0 refills | Status: AC

## 2020-09-25 MED ORDER — HYDROMORPHONE HCL 1 MG/ML PO LIQD: 0.5000 mg | ORAL | 0 refills | Status: AC

## 2020-09-25 MED ORDER — ENSURE ENLIVE PO LIQD: 237.0000 mL | Freq: Three times a day (TID) | ORAL | 12 refills | Status: AC

## 2020-09-25 NOTE — Discharge Summary (Addendum)
Physician Discharge Summary  Glenda King TSV:[id number] DOB: January 27, 1929 DOA: 09/22/2020  PCP: Glenda Glazier, MD  Admit date: 09/22/2020 Discharge date: 09/25/2020  [facility] Disposition:  [name] with Hospice  Recommendations for Outpatient Follow-up:  1. Patient was discharged on Cephalex 500 mg q6hrs for symptomatic relief. 2. Morphine and Ativan PRN will be available per Hospice team. 3. Continue catheter care.  Discharge Condition: Stable Code Status:   Code Status: DNR Diet recommendation:  Diet Order            Diet - low sodium heart healthy           Diet regular Room service appropriate? Yes with Assist; Fluid consistency: Thin  Diet effective now                  Brief/Interim Summary: 87 year old female from [place] [facility]F with a PMH of  pancreatic cancer with CBD stent in place followed by Dr. Marin Olp, bladder cancer s/p radical cystectomy and urinary diversion with a continent catheterizable pouch who presented to the ED in [facility] on 4/14 with an AKI with Cr 4.8 mg/dL and difficulty draining catheter because it was clogged with debris (patient performs clean intermittent catherization through a continent abdominal stoma).  CT of the Abdomen showed markedly distended pouch complicated by right sided hydronephrosis and hydroureter.  A 16 French Foley was placed by Urologist Dr. Milford Cage, which drained 1000 CCs of urine.    Complicated UTI: In response to coding query, this was a catheter associated UTI. - Urinalysis showed large leukocytes and nitrites with many bacteria. Urine Culture grew GNRs - susceptibilties were pending at time of discharge.  Patient received 3 days of fluids and IV antibiotics with improvement in symptoms.  Patient was discharged on Cephalex 500 mg q6hrs x 4 days to complete a 7 day course.  Treatment is for symptomatic relief.  Anemia of CKD: - On 4/15, patient's hemoglobin was 6.7 g/dL.  There was no acute  bleeding or evidence of a GI Bleed.  Patient received 1 unit of pRBC.  Repeat hemoglobin was 9 g/dL.  Patient's hemoglobin was stable on discharge.  Comfort Care - Patient and family decided to pursue comfort care and was discharged under Hospice services to [facility].  Comfort medicines were ordered.  DNR form was signed and placed in chart.  Patient was stable on day of discharge.   Discharge Diagnoses:  Principal Problem:   Acute kidney failure ([place]) Active Problems:   Primary pancreatic cancer (H[facility])   Hypokalemia   ARF (acute renal failure) ([place])   Hospice care patient Bile Duct Stricture with Percutaneous Cholecystostomy Tube in place Bladder Cancer s/p radical cystectomy with suprapubic catheter in place Complicated UTI Paroxysmal Atrial Fibrillation Chronic Kidney Disease Stage 4 Anemia of Chronic Kidney Disease Peripheral Vascular Disease Hypothyroidism Nausea and Vomiting Multiple Electrolyte Abnormalities Physical Deconditioning and Frequent Falls Severe Protein Calorie Malnutrition    Consults:  Urology Dr. Milford Cage  Subjective: Patient was stable on day of discharge.  She is ready to be discharged back to ALF.  Denies any pain complaints.  Does not appear anxious.  Discharge Exam: Vitals:   09/25/20 0611 09/25/20 1222  BP: (!) 98/57 112/61  Pulse: 68   Resp: 14   Temp: 97.7 F (36.5 C) 98 F (36.7 C)  SpO2: 96% 97%   General: Pt is alert, awake, not in acute distress, cachectic Cardiovascular: RRR, S1/S2 +, no rubs, no gallops Respiratory: CTA bilaterally,  no wheezing, no rhonchi Abdominal: Soft, no tenderness, ND, bowel sounds + Extremities: no edema, no cyanosis  Discharge Instructions  Discharge Instructions    Diet - low sodium heart healthy   Complete by: As directed    Increase activity slowly   Complete by: As directed    No wound care   Complete by: As directed      Allergies as of 09/25/2020      Reactions   Gadolinium  Derivatives Other (See Comments)   Secondary to her stage IV kidney disease   Amoxicillin Other (See Comments)   UNKNOWN REACTION   Pollen Extract Other (See Comments)   Sneezing      Medication List    STOP taking these medications   cyanocobalamin 1000 MCG tablet   levETIRAcetam 500 MG tablet Commonly known as: KEPPRA   levothyroxine 100 MCG tablet Commonly known as: SYNTHROID   metoprolol tartrate 25 MG tablet Commonly known as: LOPRESSOR   Xeloda 500 MG tablet Generic drug: capecitabine     TAKE these medications   cephALEXin 500 MG capsule Commonly known as: KEFLEX Take 1 capsule (500 mg total) by mouth 4 (four) times daily for 3 days.   feeding supplement Liqd Take 237 mLs by mouth 3 (three) times daily between meals.   HYDROmorphone HCl 1 MG/ML Liqd Commonly known as: DILAUDID Take 0.5 mLs (0.5 mg total) by mouth every 4 (four) hours.   LORazepam 0.5 MG tablet Commonly known as: ATIVAN Take 0.25 mg by mouth every 4 (four) hours as needed for anxiety.   Melatonin 10 MG Caps Take 10 mg by mouth at bedtime.   morphine CONCENTRATE 10 mg / 0.5 ml concentrated solution Take 5 mg by mouth every 4 (four) hours as needed for shortness of breath or severe pain.   ondansetron 4 MG tablet Commonly known as: ZOFRAN Take 1 tablet (4 mg total) by mouth every 8 (eight) hours as needed for nausea or vomiting.       Allergies  Allergen Reactions  . Gadolinium Derivatives Other (See Comments)    Secondary to her stage IV kidney disease   . Amoxicillin Other (See Comments)    UNKNOWN REACTION     . Pollen Extract Other (See Comments)    Sneezing    The results of significant diagnostics from this hospitalization (including imaging, microbiology, ancillary and laboratory) are listed below for reference.    Microbiology: Recent Results (from the past 240 hour(s))  Urine Culture     Status: Abnormal (Preliminary result)   Collection Time: 09/22/20  5:23 PM    Specimen: Urine, Random  Result Value Ref Range Status   Specimen Description   Final    URINE, RANDOM Performed at [facility], [address] [place], [place] [address]    Special Requests   Final    NONE Performed at [facility], [address] [place], [place] [address]    Culture (A)  Final    >=100,000 COLONIES/mL GRAM NEGATIVE RODS IDENTIFICATION AND SUSCEPTIBILITIES TO FOLLOW Performed at [facility], [place] [address] [place], [place] [address]    Report Status PENDING  Incomplete  Resp Panel by RT-PCR (Flu A&B, Covid) Nasopharyngeal Swab     Status: None   Collection Time: 09/22/20  6:22 PM   Specimen: Nasopharyngeal Swab; Nasopharyngeal(NP) swabs in vial transport medium  Result Value Ref Range Status   SARS Coronavirus 2 by RT PCR NEGATIVE NEGATIVE Final  Comment: (NOTE) SARS-CoV-2 target nucleic acids are NOT DETECTED.  The SARS-CoV-2 RNA is generally detectable in upper respiratory specimens during the acute phase of infection. The lowest concentration of SARS-CoV-2 viral copies this assay can detect is 138 copies/mL. A negative result does not preclude SARS-Cov-2 infection and should not be used as the sole basis for treatment or other patient management decisions. A negative result may occur with  improper specimen collection/handling, submission of specimen other than nasopharyngeal swab, presence of viral mutation(s) within the areas targeted by this assay, and inadequate number of viral copies(<138 copies/mL). A negative result must be combined with clinical observations, patient history, and epidemiological information. The expected result is Negative.  Fact Sheet for Patients:  EntrepreneurPulse.com.au  Fact Sheet for Healthcare Providers:  IncredibleEmployment.be  This test is no t yet approved or cleared by the [place] FDA and  has been authorized for detection and/or diagnosis  of SARS-CoV-2 by FDA under an Emergency Use Authorization (EUA). This EUA will remain  in effect (meaning this test can be used) for the duration of the COVID-19 declaration under Section 564(b)(1) of the Act, 21 U.S.C.section 360bbb-3(b)(1), unless the authorization is terminated  or revoked sooner.       Influenza A by PCR NEGATIVE NEGATIVE Final   Influenza B by PCR NEGATIVE NEGATIVE Final    Comment: (NOTE) The Xpert Xpress SARS-CoV-2/FLU/RSV plus assay is intended as an aid in the diagnosis of influenza from Nasopharyngeal swab specimens and should not be used as a sole basis for treatment. Nasal washings and aspirates are unacceptable for Xpert Xpress SARS-CoV-2/FLU/RSV testing.  Fact Sheet for Patients: EntrepreneurPulse.com.au  Fact Sheet for Healthcare Providers: IncredibleEmployment.be  This test is not yet approved or cleared by the [place] FDA and has been authorized for detection and/or diagnosis of SARS-CoV-2 by FDA under an Emergency Use Authorization (EUA). This EUA will remain in effect (meaning this test can be used) for the duration of the COVID-19 declaration under Section 564(b)(1) of the Act, 21 U.S.C. section 360bbb-3(b)(1), unless the authorization is terminated or revoked.  Performed at [facility], [address] [place], [place] [address]     Procedures/Studies: CT ABDOMEN PELVIS WO CONTRAST  Result Date: 09/23/2020 CLINICAL DATA:  Abdominal distension, catheter within neobladder not draining EXAM: CT ABDOMEN AND PELVIS WITHOUT CONTRAST TECHNIQUE: Multidetector CT imaging of the abdomen and pelvis was performed following the standard protocol without IV contrast. COMPARISON:  09/22/2020 FINDINGS: Lower chest: No acute pleural or parenchymal lung disease. Dependent lower lobe atelectasis. Hepatobiliary: Stable pneumobilia consistent with patent metallic biliary stent. Remainder of the liver is  unremarkable. Percutaneous cholecystostomy tube again noted unchanged. Pericholecystic fluid and gallbladder wall thickening again noted unchanged. The metallic common bile duct stent is unchanged. Pancreas: Amorphous soft tissue mass within the pancreatic head consistent with known neoplasm, unchanged. Cystic areas within the pancreatic tail are stable. Stable pancreatic duct dilation. Spleen: Normal in size without focal abnormality. Adrenals/Urinary Tract: The Foley catheter within the neobladder has been removed. There is increased distension, with worsening right-sided hydronephrosis and hydroureter. Bilateral renal atrophy unchanged. Stomach/Bowel: No bowel obstruction or ileus. No bowel wall thickening. Vascular/Lymphatic: Extensive atherosclerosis unchanged. No pathologic adenopathy. Reproductive: Status post hysterectomy. No adnexal masses. Other: Trace free fluid within the paracolic gutters. No free intraperitoneal gas. Musculoskeletal: No acute or destructive bony lesions. Reconstructed images demonstrate no additional findings. IMPRESSION: 1. Interval removal of the Foley catheter within the neobladder, with increased neo bladder distension. This  results in worsening right-sided hydronephrosis and hydroureter. 2. Stable common bile duct stent, with pneumobilia confirming stent patency. 3. Stable ill-defined pancreatic head mass consistent with known pancreatic cancer. 4. Stable percutaneous cholecystostomy. 5.  Aortic Atherosclerosis (ICD10-I70.0). These results were called by telephone at the time of interpretation on 09/23/2020 at 7:42 pm to provider DR Milford Cage, who verbally acknowledged these results. Electronically Signed   By: Randa Ngo M.D.   On: 09/23/2020 19:47   CT ABDOMEN PELVIS WO CONTRAST  Result Date: 09/22/2020 CLINICAL DATA:  Nausea and vomiting, flank pain, suprapubic catheter not draining, history of pancreatic cancer EXAM: CT ABDOMEN AND PELVIS WITHOUT CONTRAST TECHNIQUE:  Multidetector CT imaging of the abdomen and pelvis was performed following the standard protocol without IV contrast. COMPARISON:  07/24/2020, 09/09/2020 FINDINGS: Lower chest: Pectus excavatum deformity again noted. No acute pleural or parenchymal lung disease. Unenhanced CT was performed per clinician order. Lack of IV contrast limits sensitivity and specificity, especially for evaluation of abdominal/pelvic solid viscera. Hepatobiliary: Unenhanced imaging of the liver demonstrates diffuse pneumobilia consistent with patency of the indwelling metallic common bile duct stent. Pigtail drainage catheter is seen in the region of the gallbladder fundus. Mild pericholecystic fluid and gallbladder wall thickening. Pancreas: Evaluation of the pancreas is limited without contrast. Cystic areas in the pancreatic tail are unchanged. Ill-defined soft tissue prominence in the pancreatic head, traversed by the metallic stent, consistent with known pancreatic cancer. Stable pancreatic duct dilation. Spleen: Normal in size without focal abnormality. Adrenals/Urinary Tract: Marked bilateral renal cortical atrophy. Diverting urostomy right lower quadrant with indwelling Foley catheter. Mild distension of the bilateral renal collecting systems and ureters, with diffuse uroepithelial thickening, unchanged since prior exam. Gas and fluid are seen within the neobladder. Stomach/Bowel: No bowel obstruction or ileus. No bowel wall thickening or inflammatory change. Diverticulosis of the sigmoid colon without diverticulitis. Vascular/Lymphatic: Stable atherosclerosis of the aorta. No gross lymphadenopathy on this unenhanced exam. Reproductive: Status post hysterectomy. No adnexal masses. Other: No free fluid or free intraperitoneal gas. No abdominal wall hernia. Musculoskeletal: No acute or destructive bony lesions. The patient is cachectic. Reconstructed images demonstrate no additional findings. IMPRESSION: 1. Stable ill-defined mass in  the pancreatic head, traversed by a metallic biliary stent, consistent with known pancreatic cancer. 2. Pneumobilia consistent with stent patency. 3. Stable percutaneous cholecystostomy, with persistent wall thickening of the gallbladder and trace pericholecystic fluid. 4. Postsurgical changes from cystectomy and urinary diversion. Chronic mild bilateral hydronephrosis and hydroureter with uroepithelial thickening unchanged. There is moderate gas and fluid within the neobladder, with indwelling Foley catheter. 5.  Aortic Atherosclerosis (ICD10-I70.0). Electronically Signed   By: Randa Ngo M.D.   On: 09/22/2020 18:31     Labs: BNP (last 3 results) Recent Labs    04/02/20 2205  BNP 017.5*   Basic Metabolic Panel: Recent Labs  Lab 09/22/20 1723 09/23/20 0233 09/23/20 1533 09/24/20 0143 09/24/20 1521 09/25/20 0046  NA 132* 135 135 134*  --  136  K 2.8* 4.0 3.6 3.1* 3.3* 3.3*  CL 92* 101 104 108  --  111  CO2 23 19* 18* 19*  --  17*  GLUCOSE 100* 81 86 81  --  90  BUN 90* 82* 76* 73*  --  67*  CREATININE 5.30* 4.87* 4.43* 4.14*  --  3.74*  CALCIUM 7.4* 7.0* 6.6* 6.7*  --  7.2*  MG  --  1.9  --   --   --   --    Liver Function Tests:  Recent Labs  Lab 09/22/20 1723  AST 27  ALT 22  ALKPHOS 121  BILITOT 0.7  PROT 6.6  ALBUMIN 2.4*   CBC: Recent Labs  Lab 09/22/20 1723 09/23/20 0233 09/24/20 0143 09/24/20 1521 09/25/20 0046  WBC 13.5* 9.5 6.9  --  5.6  NEUTROABS 12.5*  --   --   --   --   HGB 8.5* 7.7* 6.7* 9.0* 8.8*  HCT 26.3* 24.2* 21.4* 28.2* 27.1*  MCV 103.1* 104.8* 105.9*  --  101.1*  PLT 235 188 159  --  166   Anemia work up Recent Labs    09/23/20 0753  VITAMINB12 3,242*  FOLATE 15.2  FERRITIN 584*  TIBC 160*  IRON 32  RETICCTPCT 1.6   Urinalysis    Component Value Date/Time   COLORURINE YELLOW 09/22/2020 1723   APPEARANCEUR CLOUDY (A) 09/22/2020 1723   LABSPEC 1.010 09/22/2020 1723   PHURINE 7.0 09/22/2020 1723   GLUCOSEU NEGATIVE  09/22/2020 1723   HGBUR LARGE (A) 09/22/2020 1723   BILIRUBINUR NEGATIVE 09/22/2020 1723   KETONESUR NEGATIVE 09/22/2020 1723   PROTEINUR 30 (A) 09/22/2020 1723   NITRITE POSITIVE (A) 09/22/2020 1723   LEUKOCYTESUR LARGE (A) 09/22/2020 1723   Sepsis Labs Invalid input(s): PROCALCITONIN,  WBC,  LACTICIDVEN Microbiology Recent Results (from the past 240 hour(s))  Urine Culture     Status: Abnormal (Preliminary result)   Collection Time: 09/22/20  5:23 PM   Specimen: Urine, Random  Result Value Ref Range Status   Specimen Description   Final    URINE, RANDOM Performed at [facility], [address] [place], [place] [address]    Special Requests   Final    NONE Performed at [facility], [address] [place], [place] [address]    Culture (A)  Final    >=100,000 COLONIES/mL GRAM NEGATIVE RODS IDENTIFICATION AND SUSCEPTIBILITIES TO FOLLOW Performed at [facility], [place] [address], [place], [place] [address]    Report Status PENDING  Incomplete  Resp Panel by RT-PCR (Flu A&B, Covid) Nasopharyngeal Swab     Status: None   Collection Time: 09/22/20  6:22 PM   Specimen: Nasopharyngeal Swab; Nasopharyngeal(NP) swabs in vial transport medium  Result Value Ref Range Status   SARS Coronavirus 2 by RT PCR NEGATIVE NEGATIVE Final    Comment: (NOTE) SARS-CoV-2 target nucleic acids are NOT DETECTED.  The SARS-CoV-2 RNA is generally detectable in upper respiratory specimens during the acute phase of infection. The lowest concentration of SARS-CoV-2 viral copies this assay can detect is 138 copies/mL. A negative result does not preclude SARS-Cov-2 infection and should not be used as the sole basis for treatment or other patient management decisions. A negative result may occur with  improper specimen collection/handling, submission of specimen other than nasopharyngeal swab, presence of viral mutation(s) within the areas targeted by this assay, and  inadequate number of viral copies(<138 copies/mL). A negative result must be combined with clinical observations, patient history, and epidemiological information. The expected result is Negative.  Fact Sheet for Patients:  EntrepreneurPulse.com.au  Fact Sheet for Healthcare Providers:  IncredibleEmployment.be  This test is no t yet approved or cleared by the [place] FDA and  has been authorized for detection and/or diagnosis of SARS-CoV-2 by FDA under an Emergency Use Authorization (EUA). This EUA will remain  in effect (meaning this test can be used) for the duration of the COVID-19 declaration under Section 564(b)(1) of the Act, 21  U.S.C.section 360bbb-3(b)(1), unless the authorization is terminated  or revoked sooner.       Influenza A by PCR NEGATIVE NEGATIVE Final   Influenza B by PCR NEGATIVE NEGATIVE Final    Comment: (NOTE) The Xpert Xpress SARS-CoV-2/FLU/RSV plus assay is intended as an aid in the diagnosis of influenza from Nasopharyngeal swab specimens and should not be used as a sole basis for treatment. Nasal washings and aspirates are unacceptable for Xpert Xpress SARS-CoV-2/FLU/RSV testing.  Fact Sheet for Patients: EntrepreneurPulse.com.au  Fact Sheet for Healthcare Providers: IncredibleEmployment.be  This test is not yet approved or cleared by the [place] FDA and has been authorized for detection and/or diagnosis of SARS-CoV-2 by FDA under an Emergency Use Authorization (EUA). This EUA will remain in effect (meaning this test can be used) for the duration of the COVID-19 declaration under Section 564(b)(1) of the Act, 21 U.S.C. section 360bbb-3(b)(1), unless the authorization is terminated or revoked.  Performed at [facility], [address], [place], [place] [address]     Time coordinating discharge: 60 minutes  SIGNED: George Hugh, MD  [facility] 09/25/2020, 12:31 PM

## 2020-09-25 NOTE — Consult Note (Signed)
Consultation Note Date: 09/25/2020   Patient Name: Glenda King  DOB: 1928-07-17  MRN: [id number]  Age / Sex: 87 y.o., female  PCP: Javier Glazier, MD Referring Physician: George Hugh, MD  Reason for Consultation: Establishing goals of care  HPI/Patient Profile: 87 y.o. female  with past medical history of pancreatic cancer, PAF not on anticoagulation due to risk of bleeding, CKD, bladder cancer s/p radical cystectomy and urinary diversion, and hypothyroidism admitted on 09/22/2020 with clogged suprapubic catheter, AKI on CKD4 and possible UTI. Patient was recently hospitalized a few weeks ago after a recent fall with difficulty ambulating and right hip pain.  Palliative care consulted to assist with goals of care discussion.  Clinical Assessment and Goals of Care:  I have reviewed medical records including EPIC notes, labs and imaging, assessed the patient and then met at the bedside along with her brother Nicole Kindred and sister-in-law Lelon Frohlich to discuss diagnosis, prognosis, GOC, EOL wishes, disposition and options.  I introduced Palliative Medicine as specialized medical care for people living with serious illness. It focuses on providing relief from the symptoms and stress of a serious illness. The goal is to improve quality of life for both the patient and the family.  We discussed a brief life review of the patient and then focused on their current illness. The natural disease trajectory and expectations at EOL were discussed.  I attempted to elicit values and goals of care important to the patient.    [name] has lived at [facility] for 11 years and enjoys it very much. She moved there with her husband, who unfortunately passed away 2 years ago. 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 They have no children and moved to [facility] during  retirement to be closer to her brother's family. She also has 3 sisters that visit occasionally. She has been very active her entire life. She enjoyed walking to the chapel and spending time with 2-3 close friends at the facility. Until her recent fall, she was ambulating without assistive devices, bathing, and dressing herself. All of her meals are delivered and she speaks highly of the great amount of resources the facility offers. [name] is a faithful woman and understands the severity of her illness. She accepts her mortality and voices confusion as to why she is being asked this. She has expressed agreement with hospice during recent hospitalization a couple of weeks ago.   Patient's brother Nicole Kindred and sister-in-law Lelon Frohlich then enter the room and join the conversation. They provide clarification that the patient had declined hospice after recent discharge. Nicole Kindred and Lelon Frohlich are supportive and in agreement that a comfort-focused path it most appropriate at this time. They encourage patient to consider again. We discussed options of continuing current medical interventions or comfort care and returning home to [facility] with hospice. Hospice and Palliative Care services outpatient were explained and offered. Patient is very clear that she would like to return home as soon as possible.  She agrees with hospice philosophy. Her short term goal is to go home and manage her pain so she may attempt walking on her own again. Her long-term goals are "in God's hands."  Advanced directives, concepts specific to code status, artifical feeding and hydration, and rehospitalization were considered and discussed. [name] and her family agree to complete a MOST form and hope this communicates her goals for clear reference in the future.   Cardiopulmonary Resuscitation: Do Not Attempt Resuscitation (DNR/No CPR)  Medical Interventions: Comfort Measures: Keep clean, warm, and dry. Use medication by any route, positioning,  wound care, and other measures to relieve pain and suffering. Use oxygen, suction and manual treatment of airway obstruction as needed for comfort. Do not transfer to the hospital unless comfort needs cannot be met in current location.  Antibiotics: Determine use of limitation of antibiotics when infection occurs  IV Fluids: No IV fluids (provide other measures to ensure comfort)  Feeding Tube: No feeding tube     Discussed the importance of continued conversation with family and the medical providers regarding overall plan of care and treatment options, ensuring decisions are within the context of the patient's values and GOCs.    Questions and concerns were addressed. The family was encouraged to call with questions or concerns.  PMT will continue to support holistically.   PATIENT is the primary decision maker. No HCPOA on file.    SUMMARY OF RECOMMENDATIONS   -Transition to full comfort care -Patient desires to return home to [facility] with hospice as soon as possible -Discussed with Dr. Lavera Guise, [facility] liason Melissa O'Bryant RN, and [place] form completed. Original placed in hard chart. Provided patient's family with a copy. Will scan a copy into EMR/Vynca. -DNR form signed and placed in hard chart for discharge  Code Status/Advance Care Planning:  DNR  Symptom Management:   Scheduled dilaudid 0.85m PO Q4H for pain/dyspnea  Palliative Prophylaxis:   Frequent Pain Assessment and Turn Reposition  Additional Recommendations (Limitations, Scope, Preferences):  Full Comfort Care  Psycho-social/Spiritual:   Desire for further Chaplaincy support:undetermined  Additional Recommendations: Education on Hospice  Prognosis:   Poor prognosis given functional and nutritional decline, advanced age, frailty, pancreatic cancer with inability to tolerate oral chemotherapy. < 6 months and certainly hospice appropriate  Discharge Planning: Home with Hospice       Primary Diagnoses: Present on Admission: . Acute kidney failure ([facility] . Primary pancreatic cancer ([facility] . ARF (acute renal failure) ([facility]   I have reviewed the medical record, interviewed the patient and family, and examined the patient. The following aspects are pertinent.  Past Medical History:  Diagnosis Date  . Anemia   . Cancer ([facility]    bladder cancer  . Cardiomyopathy ([facility]   . CKD (chronic kidney disease)   . Goals of care, counseling/discussion 12/10/2019  . Primary pancreatic cancer ([facility] 12/10/2019  . Thyroid disease    hypothyroid   Social History   Socioeconomic History  . Marital status: Married    Spouse name: Not on file  . Number of children: Not on file  . Years of education: Not on file  . Highest education level: Not on file  Occupational History  . Not on file  Tobacco Use  . Smoking status: Never Smoker  . Smokeless tobacco: Never Used  Vaping Use  . Vaping Use: Never used  Substance and Sexual Activity  . Alcohol use: Not Currently  . Drug use: Not Currently  .  Sexual activity: Not on file  Other Topics Concern  . Not on file  Social History Narrative  . Not on file   Social Determinants of Health   Financial Resource Strain: Not on file  Food Insecurity: Not on file  Transportation Needs: Not on file  Physical Activity: Not on file  Stress: Not on file  Social Connections: Not on file   Family History  Problem Relation Age of Onset  . Hypertension Other    Scheduled Meds: . sodium chloride   Intravenous Once  . feeding supplement  237 mL Oral TID BM  . HYDROmorphone HCl  0.5 mg Oral Q4H  . levothyroxine  100 mcg Oral QAC breakfast  . melatonin  10 mg Oral QHS  . metoprolol tartrate  25 mg Oral BID  . multivitamin with minerals  1 tablet Oral Daily  . potassium chloride  40 mEq Oral Once  . cyanocobalamin  1,000 mcg Oral Daily   Continuous Infusions: . cefTRIAXone (ROCEPHIN)  IV 1 g (09/24/20 1604)   PRN  Meds:.acetaminophen **OR** acetaminophen Medications Prior to Admission:  Prior to Admission medications   Medication Sig Start Date End Date Taking? Authorizing Provider  levETIRAcetam (KEPPRA) 500 MG tablet Take 500 mg by mouth 2 (two) times daily. 05/13/19  Yes [provider]  LORazepam (ATIVAN) 0.5 MG tablet Take 0.25 mg by mouth every 4 (four) hours as needed for anxiety.   Yes [provider]  Morphine Sulfate (MORPHINE CONCENTRATE) 10 mg / 0.5 ml concentrated solution Take 5 mg by mouth every 4 (four) hours as needed for shortness of breath or severe pain.   Yes [provider]  ondansetron (ZOFRAN) 4 MG tablet Take 1 tablet (4 mg total) by mouth every 8 (eight) hours as needed for nausea or vomiting. 09/06/20  Yes Ennever, Rudell Cobb, MD  capecitabine (XELODA) 500 MG tablet TAKE 2 TABLETS (1,000 MG TOTAL) BY MOUTH 2 (TWO) TIMES DAILY AFTER A MEAL. TAKE FOR 14 DAYS, THEN HOLD FOR 7 DAYS. REPEAT EVERY 21 DAYS. Patient not taking: Reported on 09/23/2020 08/13/20 08/13/21  [name], MD  levothyroxine (SYNTHROID) 100 MCG tablet Take 100 mcg by mouth daily. Patient not taking: Reported on 09/23/2020 05/13/19   [provider]  Melatonin 10 MG CAPS Take 10 mg by mouth at bedtime. Patient not taking: Reported on 09/23/2020    [provider]  metoprolol tartrate (LOPRESSOR) 25 MG tablet Take 1 tablet (25 mg total) by mouth 2 (two) times daily. 04/06/20 05/06/20  Arrien, Jimmy Picket, MD  metoprolol tartrate (LOPRESSOR) 25 MG tablet Take 25 mg by mouth 2 (two) times daily. Patient not taking: Reported on 09/23/2020    [provider]  vitamin B-12 1000 MCG tablet Take 1 tablet (1,000 mcg total) by mouth daily. Patient not taking: Reported on 09/23/2020 09/06/19   Elmarie Shiley, MD   Allergies  Allergen Reactions  . Gadolinium Derivatives Other (See Comments)    Secondary to her stage IV kidney disease   . Amoxicillin Other (See Comments)     UNKNOWN REACTION     . Pollen Extract Other (See Comments)    Sneezing   Review of Systems  Musculoskeletal: Positive for arthralgias (right hip).  All other systems reviewed and are negative.   Physical Exam Vitals and nursing note reviewed.  Cardiovascular:     Rate and Rhythm: Normal rate.  Pulmonary:     Effort: Pulmonary effort is normal. No respiratory distress.  Neurological:  Mental Status: She is alert. Mental status is at baseline.  Psychiatric:        Mood and Affect: Mood normal.     Vital Signs: BP (!) 98/57 (BP Location: Left Arm)   Pulse 68   Temp 97.7 F (36.5 C) (Oral)   Resp 14   Ht _0  (1.702 m)   Wt 44.1 kg   SpO2 96%   BMI 15.23 kg/m  Pain Scale: 0-10   Pain Score: 0-No pain   SpO2: SpO2: 96 % O2 Device:SpO2: 96 % O2 Flow Rate: .O2 Flow Rate (L/min): 2 L/min  IO: Intake/output summary:   Intake/Output Summary (Last 24 hours) at 09/25/2020 1024 Last data filed at 09/25/2020 0630 Gross per 24 hour  Intake 2024.22 ml  Output 1010 ml  Net 1014.22 ml    LBM: Last BM Date:  (pt unsure) Baseline Weight: Weight: 44.5 kg Most recent weight: Weight: 44.1 kg     Palliative Assessment/Data: 40%     Time In: 9:00am Time Out: 10:15am Time Total: 75 minutes  Greater than 50% of this time was spent counseling and coordinating care related to the above assessment and plan.   Signed by: Dorthy Cooler, PA-C Palliative Medicine Team Team phone # [phone]  Thank you for allowing the Palliative Medicine Team to assist in the care of this patient. Please utilize secure chat with additional questions, if there is no response within 30 minutes please call the above phone number.  Palliative Medicine Team providers are available by phone from 7am to 7pm daily and can be reached through the team cell phone.  Should this patient require assistance outside of these hours, please call the patient's attending  physician.

## 2020-09-25 NOTE — TOC Transition Note (Signed)
Transition of Care [facility]) - CM/SW Discharge Note   Patient Details  Name: Glenda King MRN: [id number] Date of Birth: [birth year]  Transition of Care [facility] CM/SW Contact:  Coralee Pesa, [place] Phone Number: 09/25/2020, 1:40 PM   Clinical Narrative:    Pt will be transported back to [facility] by [facility]. Nurse to call report to [phone], ask for ALF nurse. Please notify family when pt transfers.   Final next level of care: Assisted Living Barriers to Discharge: Barriers Resolved   Patient Goals and CMS Choice        Discharge Placement              Patient chooses bed at: [facility] Patient to be transferred to facility by: [facility] Name of family member notified: Brett Fairy Patient and family notified of of transfer: 09/25/20  Discharge Plan and Services                                     Social Determinants of Health (SDOH) Interventions     Readmission Risk Interventions No flowsheet data found.

## 2020-09-25 NOTE — Progress Notes (Signed)
Occupational Therapy Evaluation Patient Details Name: Glenda King MRN: [id number] DOB: 1928/09/11 Today's Date: 09/25/2020    History of Present Illness The pt is an 87 yo female presenting from [place] due to suprapubic catheter not draining. Upon workup, pt found to have elevated creatinine and hypotension. Pt now s/p 1 unit PRBC. PMH includes: recent admission for AKI, CKD, pancreatic cancer on home hospice, and anemia.   Clinical Impression   PTA patient reports needing assist for ADLs and mobility, unclear accuracy of history as patient disoriented to situation and reports "before I fell I could do it all on my own" and reports "I've been here since I fell". Patient currently completing bed mobility with min assist, transfers with mod assist, UB ADLs with min assist and LB ADls with up to max assist.  She perseverates on setup of the room throughout session, presents with decreased STM, awareness and problem solving.  She will benefit from continued OT services while admitted and after dc at [facility] level, given [facility] can continue to provide increased assistance.   Will follow acutely.     Follow Up Recommendations  Home health OT;Supervision/Assistance - 24 hour    Equipment Recommendations  3 in 1 bedside commode    Recommendations for Other Services       Precautions / Restrictions Precautions Precautions: Fall Precaution Comments: L abdominal drain, suprapubic catheter Restrictions Weight Bearing Restrictions: No      Mobility Bed Mobility Overal bed mobility: Needs Assistance Bed Mobility: Supine to Sit;Sit to Supine     Supine to sit: Min assist Sit to supine: Min assist   General bed mobility comments: min assist for trunk support to ascend and B LE support back to supine    Transfers Overall transfer level: Needs assistance Equipment used: 1 person hand held assist Transfers: Sit to/from Stand Sit to Stand: Mod assist         General  transfer comment: mod assist to power up during face to face transition into stand, side stepping towards [facility] with mod assist due to weakness and knee buckling    Balance Overall balance assessment: Needs assistance Sitting-balance support: No upper extremity supported;Feet supported Sitting balance-Leahy Scale: Fair     Standing balance support: Bilateral upper extremity supported;During functional activity Standing balance-Leahy Scale: Poor Standing balance comment: relies on BUE and external support                           ADL either performed or assessed with clinical judgement   ADL Overall ADL's : Needs assistance/impaired Eating/Feeding: Set up;Bed level   Grooming: Set up;Sitting   Upper Body Bathing: Minimal assistance;Sitting   Lower Body Bathing: Maximal assistance;Sit to/from stand   Upper Body Dressing : Minimal assistance;Sitting   Lower Body Dressing: Maximal assistance;Sit to/from Health and safety inspector Details (indicate cue type and reason): simulated side stepping to [facility] with mod assist         Functional mobility during ADLs: Moderate assistance;Cueing for safety;Cueing for sequencing General ADL Comments: pt limited by weakness, impaired balance, cognition     Vision         Perception     Praxis      Pertinent Vitals/Pain Pain Assessment: No/denies pain     Hand Dominance Right   Extremity/Trunk Assessment Upper Extremity Assessment Upper Extremity Assessment: Generalized weakness   Lower Extremity Assessment Lower Extremity Assessment: Defer to PT evaluation  Cervical / Trunk Assessment Cervical / Trunk Assessment: Kyphotic   Communication Communication Communication: No difficulties   Cognition Arousal/Alertness: Awake/alert Behavior During Therapy: Flat affect;WFL for tasks assessed/performed Overall Cognitive Status: No family/caregiver present to determine baseline cognitive functioning Area of Impairment:  Orientation;Memory;Safety/judgement;Awareness;Problem solving                 Orientation Level: Disoriented to;Situation (pt stating she has been at hospital ever since her fall, but per chart pt fell ~1 week prior to [place] and presented to hospital for clogged catheter.)   Memory: Decreased short-term memory   Safety/Judgement: Decreased awareness of safety Awareness: Emergent Problem Solving: Slow processing;Difficulty sequencing;Requires verbal cues General Comments: patient requires increased time to process and engage, perseverates on setup of room throughout session.  She reports she has been in here since she fell and has decreased awareness of situation.   General Comments  VSS    Exercises     Shoulder Instructions      Home Living Family/patient expects to be discharged to:: Assisted living                                 Additional Comments: pt from [facility], states she has been in assisted living since last admission with staff providing all needs      Prior Functioning/Environment Level of Independence: Needs assistance  Gait / Transfers Assistance Needed: reports needing assist for +2 for transfers ADL's / Homemaking Assistance Needed: reports needing increased assistance since fall, but able to bathe/dress independently prior   Comments: poor historian        OT Problem List: Decreased strength;Decreased activity tolerance;Impaired balance (sitting and/or standing);Decreased cognition;Decreased safety awareness;Decreased knowledge of use of DME or AE;Decreased knowledge of precautions      OT Treatment/Interventions: Self-care/ADL training;DME and/or AE instruction;Therapeutic activities;Cognitive remediation/compensation;Patient/family education;Balance training    OT Goals(Current goals can be found in the care plan section) Acute Rehab OT Goals Patient Stated Goal: improve ability to move on her own OT Goal Formulation: With  patient Time For Goal Achievement: 10/09/20 Potential to Achieve Goals: Good  OT Frequency: Min 2X/week   Barriers to D/C:            Co-evaluation              AM-PAC OT "6 Clicks" Daily Activity     Outcome Measure Help from another person eating meals?: A Little Help from another person taking care of personal grooming?: A Little Help from another person toileting, which includes using toliet, bedpan, or urinal?: Total Help from another person bathing (including washing, rinsing, drying)?: A Lot Help from another person to put on and taking off regular upper body clothing?: A Little Help from another person to put on and taking off regular lower body clothing?: A Lot 6 Click Score: 14   End of Session Nurse Communication: Mobility status  Activity Tolerance: Patient tolerated treatment well Patient left: in bed;with call bell/phone within reach;with bed alarm set  OT Visit Diagnosis: Other abnormalities of gait and mobility (R26.89);Muscle weakness (generalized) (M62.81);Other symptoms and signs involving cognitive function;History of falling (Z91.81)                Time: 5885-0277 OT Time Calculation (min): 20 min Charges:  OT General Charges $OT Visit: 1 Visit OT Evaluation $OT Eval Moderate Complexity: [place], OT Acute Rehabilitation Services Pager [phone] Office [phone]  Delight Stare 09/25/2020, 12:40 PM

## 2020-09-25 NOTE — Progress Notes (Signed)
Manufacturing engineer [facility])  Glenda King was reffered to [place] by the medical team for hospice at discharge. Patient will be discharge today to [facility] with hospice. This patients chart is currently under review.   Left message with family to confirm interest. MD has confirmed interest in hospice at discharge at [facility] and is set for discharge. No DME needed. [facility] will be in touch with family regarding assessment visit at [facility].   Please send completed DNR form and any comfort care medications with the patient.   If you have any questions or concerns, please feel free to call.  Clementeen Hoof, BSN, [employer] [phone]

## 2020-09-25 NOTE — NC FL2 (Signed)
[place] LEVEL OF CARE SCREENING TOOL     IDENTIFICATION  Patient Name: Glenda King Birthdate: Nov 10, 1928 Sex: female Admission Date (Current Location): 09/22/2020  [facility] and [place] Number:  Herbalist and Address:  [facility] [place], [place] [address], [place], [place] [address]      Provider Number: [id number]  Attending Physician Name and Address:  George Hugh, MD  Relative Name and Phone Number:  Brett Fairy [phone]    Current Level of Care: Hospital Recommended Level of Care: [place] Prior Approval Number:    Date Approved/Denied:   PASRR Number:    Discharge Plan: Other (Comment) (ALF w/ hospice)    Current Diagnoses: Patient Active Problem List   Diagnosis Date Noted  . Hospice care patient 09/25/2020  . Hypokalemia 09/23/2020  . ARF (acute renal failure) ([place]) 09/23/2020  . Acute kidney failure ([place]) 09/22/2020  . Protein-calorie malnutrition, severe 04/06/2020  . New onset a-fib ([place]) 04/02/2020  . Acute lower UTI 04/02/2020  . AKI (acute kidney injury) ([place]) 04/02/2020  . Elevated troponin 04/02/2020  . Genetic testing 12/16/2019  . Primary pancreatic cancer ([place]) 12/10/2019  . Goals of care, counseling/discussion 12/10/2019  . Bile duct stricture   . Jaundice 09/04/2019  . Hyperbilirubinemia 09/04/2019  . Acute cholecystitis due to biliary calculus 09/03/2019  . Choledocholithiasis 09/03/2019  . Chronic kidney disease (CKD) stage G4/A1, severely decreased glomerular filtration rate (GFR) between 15-29 mL/min/1.73 square meter and albuminuria creatinine ratio less than 30 mg/g (HCC) 09/03/2019  . History of seizure 09/03/2019  . Meningioma ([place]) 05/15/2016  . Urinary retention 01/20/2016  . Left frontal lobe mass 03/24/2014  . History of bladder cancer 02/12/2014  . History of urinary diversion procedure 02/12/2014  . Hydronephrosis, bilateral 02/12/2014  . Anemia in chronic  renal disease 11/17/2013  . Lack of adequate sleep 11/16/2012  . Malignant neoplasm of bladder, unspecified ([place]) 10/17/2007  . COLONIC POLYPS 10/17/2007  . Hypothyroidism 10/17/2007  . HYPERCHOLESTEROLEMIA, BORDERLINE 10/17/2007  . RENAL INSUFFICIENCY 10/17/2007    Orientation RESPIRATION BLADDER Height & Weight     Self,Situation,Place  Normal  (Suprapubic catheter) Weight: 97 lb 3.6 oz (44.1 kg) Height:  5\' 7"  (170.2 cm)  BEHAVIORAL SYMPTOMS/MOOD NEUROLOGICAL BOWEL NUTRITION STATUS      Continent Diet (See DC summary)  AMBULATORY STATUS COMMUNICATION OF NEEDS Skin   Extensive Assist Verbally Skin abrasions (Skin abrasions on Elbow and knee w/ foam dressing; Face lac on anterior forehead w/ sutures, jaw and R upper eye)                       Personal Care Assistance Level of Assistance  Bathing,Feeding,Dressing Bathing Assistance: Limited assistance Feeding assistance: Independent Dressing Assistance: Limited assistance     Functional Limitations Info  Sight,Hearing,Speech Sight Info: Adequate Hearing Info: Adequate Speech Info: Adequate    SPECIAL CARE FACTORS FREQUENCY                       Contractures Contractures Info: Not present    Additional Factors Info  Code Status,Allergies Code Status Info: DNR Allergies Info: Gadolinium Derivatives   Amoxicillin   Pollen Extract           Current Medications (09/25/2020):  This is the current hospital active medication list Current Facility-Administered Medications  Medication Dose Route Frequency Provider Last Rate Last Admin  . 0.9 %  sodium chloride infusion (Manually  program via Guardrails IV Fluids)   Intravenous Once Shela Leff, MD      . acetaminophen (TYLENOL) tablet 650 mg  650 mg Oral Q6H PRN Rise Patience, MD   650 mg at 09/23/20 0107   Or  . acetaminophen (TYLENOL) suppository 650 mg  650 mg Rectal Q6H PRN Rise Patience, MD      . cefTRIAXone (ROCEPHIN) 1 g in sodium  chloride 0.9 % 100 mL IVPB  1 g Intravenous Q24H Rise Patience, MD 200 mL/hr at 09/24/20 1604 1 g at 09/24/20 1604  . feeding supplement (ENSURE ENLIVE / ENSURE PLUS) liquid 237 mL  237 mL Oral TID BM Kc, Ramesh, MD   237 mL at 09/24/20 1006  . HYDROmorphone HCl (DILAUDID) liquid 0.5 mg  0.5 mg Oral Q4H Cooper, Josseline P, PA-C      . levothyroxine (SYNTHROID) tablet 100 mcg  100 mcg Oral QAC breakfast Rise Patience, MD   100 mcg at 09/25/20 1221  . melatonin tablet 10 mg  10 mg Oral QHS Rise Patience, MD      . metoprolol tartrate (LOPRESSOR) tablet 25 mg  25 mg Oral BID Rise Patience, MD   25 mg at 09/25/20 1222  . multivitamin with minerals tablet 1 tablet  1 tablet Oral Daily Antonieta Pert, MD   1 tablet at 09/25/20 1221  . vitamin B-12 (CYANOCOBALAMIN) tablet 1,000 mcg  1,000 mcg Oral Daily Rise Patience, MD   1,000 mcg at 09/25/20 1221     Discharge Medications:    cephALEXin 500 MG capsule Commonly known as: KEFLEX Take 1 capsule (500 mg total) by mouth 4 (four) times daily for 3 days.   feeding supplement Liqd Take 237 mLs by mouth 3 (three) times daily between meals.   HYDROmorphone HCl 1 MG/ML Liqd Commonly known as: DILAUDID Take 0.5 mLs (0.5 mg total) by mouth every 4 (four) hours.   LORazepam 0.5 MG tablet Commonly known as: ATIVAN Take 0.25 mg by mouth every 4 (four) hours as needed for anxiety.   Melatonin 10 MG Caps Take 10 mg by mouth at bedtime.   morphine CONCENTRATE 10 mg / 0.5 ml concentrated solution Take 5 mg by mouth every 4 (four) hours as needed for shortness of breath or severe pain.   ondansetron 4 MG tablet Commonly known as: ZOFRAN Take 1 tablet (4 mg total) by mouth every 8 (eight) hours as needed for nausea or vomiting.        Relevant Imaging Results:  Relevant Lab Results:   Additional Information SS# [id number] [place]

## 2020-09-26 ENCOUNTER — Other Ambulatory Visit: Payer: Self-pay | Admitting: Internal Medicine

## 2020-09-26 LAB — URINE CULTURE: Culture: >=100000 — AB

## 2020-09-26 MED ORDER — CIPROFLOXACIN HCL 250 MG PO TABS: 250.0000 mg | ORAL_TABLET | Freq: Two times a day (BID) | ORAL | 0 refills | Status: AC

## 2020-09-26 NOTE — Progress Notes (Signed)
Urine Cultures came back resistant to CTX and sensitive to fluoroquinolones. I will order Ciprofloxacin 250 mg BID x 3 days to local pharmacy.  George Hugh MD

## 2020-09-28 ENCOUNTER — Encounter: Payer: Self-pay | Admitting: *Deleted

## 2020-09-28 LAB — TYPE AND SCREEN
ABO/RH(D): A POS
Antibody Screen: POSITIVE
Donor AG Type: NEGATIVE
Donor AG Type: NEGATIVE
Unit division: 0
Unit division: 0

## 2020-09-28 LAB — BPAM RBC
Blood Product Expiration Date: 202204212359
Blood Product Expiration Date: 202204232359
ISSUE DATE / TIME: 202204150937
Unit Type and Rh: 600
Unit Type and Rh: 6200

## 2020-09-28 NOTE — Progress Notes (Signed)
Received a call from Glenda King, patient's friend. She states Glenda King went to the hospital over the weekend and she was frustrated with the amount of testing she was having done. When she was discharged, she was sent back to [facility] with Hospice. Glenda King feels like they are doing a good job in keeping the patient comfortable.  Oncology Nurse Navigator Documentation  Oncology Nurse Navigator Flowsheets 09/28/2020  Abnormal Finding Date -  Diagnosis Status -  Planned Course of Treatment -  Phase of Treatment -  Chemotherapy Pending- Reason: -  Chemotherapy Actual Start Date: -  Radiation Actual Start Date: -  Radiation Expected End Date: -  Radiation Actual End Date: -  Navigator Follow Up Date: -  Navigator Follow Up Reason: -  Navigation Complete Date: -  Post Navigation: Continue to Follow Patient? -  Reason Not Navigating Patient: -  Financial planner  Referral Date to RadOnc/MedOnc -  Navigator Encounter Type Telephone  Telephone Incoming Call;Patient Update  Treatment Initiated Date -  Patient Visit Type MedOnc  Treatment Phase Other  Barriers/Navigation Needs Coordination of Care;Education  Education -  Interventions Psycho-Social Support  Acuity Level 2-Minimal Needs (1-2 Barriers Identified)  Referrals -  Coordination of Care -  Education Method -  Support Groups/Services Friends and Family  Time Spent with Patient 30

## 2020-10-10 DEATH — deceased

## 2020-10-28 ENCOUNTER — Other Ambulatory Visit (HOSPITAL_COMMUNITY): Payer: Medicare Other
